# Patient Record
Sex: Female | Born: 1955
Health system: Southern US, Community
[De-identification: ages and names within clinical notes are randomized; demographics above are authoritative.]

## PROBLEM LIST (undated history)

## (undated) ENCOUNTER — Emergency Department (HOSPITAL_BASED_OUTPATIENT_CLINIC_OR_DEPARTMENT_OTHER): Admission: EM | Payer: Medicare Other

## (undated) DIAGNOSIS — K219 Gastro-esophageal reflux disease without esophagitis: Secondary | ICD-10-CM

## (undated) DIAGNOSIS — E785 Hyperlipidemia, unspecified: Secondary | ICD-10-CM

## (undated) DIAGNOSIS — H35039 Hypertensive retinopathy, unspecified eye: Secondary | ICD-10-CM

## (undated) DIAGNOSIS — D34 Benign neoplasm of thyroid gland: Secondary | ICD-10-CM

## (undated) DIAGNOSIS — M1990 Unspecified osteoarthritis, unspecified site: Secondary | ICD-10-CM

## (undated) DIAGNOSIS — D649 Anemia, unspecified: Secondary | ICD-10-CM

## (undated) DIAGNOSIS — I10 Essential (primary) hypertension: Secondary | ICD-10-CM

## (undated) DIAGNOSIS — R311 Benign essential microscopic hematuria: Secondary | ICD-10-CM

## (undated) DIAGNOSIS — M8580 Other specified disorders of bone density and structure, unspecified site: Secondary | ICD-10-CM

## (undated) DIAGNOSIS — I2510 Atherosclerotic heart disease of native coronary artery without angina pectoris: Secondary | ICD-10-CM

## (undated) DIAGNOSIS — T883XXA Malignant hyperthermia due to anesthesia, initial encounter: Secondary | ICD-10-CM

## (undated) DIAGNOSIS — R200 Anesthesia of skin: Secondary | ICD-10-CM

## (undated) DIAGNOSIS — R202 Paresthesia of skin: Secondary | ICD-10-CM

## (undated) DIAGNOSIS — Z8489 Family history of other specified conditions: Secondary | ICD-10-CM

## (undated) HISTORY — DX: Hyperlipidemia, unspecified: E78.5

## (undated) HISTORY — DX: Benign neoplasm of thyroid gland: D34

## (undated) HISTORY — PX: TUBAL LIGATION: SHX77

## (undated) HISTORY — PX: DILATION AND CURETTAGE OF UTERUS: SHX78

## (undated) HISTORY — DX: Gastro-esophageal reflux disease without esophagitis: K21.9

## (undated) HISTORY — DX: Essential (primary) hypertension: I10

## (undated) HISTORY — DX: Benign essential microscopic hematuria: R31.1

## (undated) HISTORY — DX: Hypertensive retinopathy, unspecified eye: H35.039

## (undated) HISTORY — PX: CARDIAC CATHETERIZATION: SHX172

## (undated) HISTORY — DX: Other specified disorders of bone density and structure, unspecified site: M85.80

---

## 1994-11-25 HISTORY — PX: ABDOMINAL HYSTERECTOMY: SHX81

## 1997-11-25 HISTORY — PX: KNEE CARTILAGE SURGERY: SHX688

## 2008-11-25 HISTORY — PX: COLONOSCOPY: SHX174

## 2008-11-25 LAB — HM COLONOSCOPY: HM Colonoscopy: NORMAL

## 2010-11-25 LAB — HM DEXA SCAN: HM Dexa Scan: NORMAL

## 2010-12-17 ENCOUNTER — Emergency Department (HOSPITAL_BASED_OUTPATIENT_CLINIC_OR_DEPARTMENT_OTHER)
Admission: EM | Admit: 2010-12-17 | Discharge: 2010-12-17 | Payer: Self-pay | Source: Home / Self Care | Admitting: Emergency Medicine

## 2011-02-24 LAB — HM PAP SMEAR: HM Pap smear: NORMAL

## 2011-06-14 ENCOUNTER — Encounter: Payer: Self-pay | Admitting: Emergency Medicine

## 2011-06-14 ENCOUNTER — Emergency Department (HOSPITAL_BASED_OUTPATIENT_CLINIC_OR_DEPARTMENT_OTHER)
Admission: EM | Admit: 2011-06-14 | Discharge: 2011-06-14 | Disposition: A | Discharge status: Home / Self Care | Payer: BC Managed Care – PPO | Attending: Emergency Medicine | Admitting: Emergency Medicine

## 2011-06-14 DIAGNOSIS — F17200 Nicotine dependence, unspecified, uncomplicated: Secondary | ICD-10-CM | POA: Insufficient documentation

## 2011-06-14 DIAGNOSIS — L02429 Furuncle of limb, unspecified: Secondary | ICD-10-CM | POA: Insufficient documentation

## 2011-06-14 DIAGNOSIS — L02439 Carbuncle of limb, unspecified: Secondary | ICD-10-CM | POA: Insufficient documentation

## 2011-06-14 DIAGNOSIS — L0292 Furuncle, unspecified: Secondary | ICD-10-CM

## 2011-06-14 MED ORDER — DOXYCYCLINE HYCLATE 100 MG PO TABS: 100.0000 mg | ORAL_TABLET | Freq: Two times a day (BID) | ORAL | Status: DC

## 2011-06-14 MED ORDER — DOXYCYCLINE HYCLATE 100 MG PO TABS
100.0000 mg | ORAL_TABLET | Freq: Once | ORAL | Status: AC
  Administered 2011-06-14: 100 mg via ORAL
  Filled 2011-06-14: qty 1

## 2011-06-14 MED ORDER — HYDROCODONE-ACETAMINOPHEN 5-325 MG PO TABS: 1.0000 | ORAL_TABLET | ORAL | Status: DC | PRN

## 2011-06-14 MED ORDER — HYDROCODONE-ACETAMINOPHEN 5-325 MG PO TABS
1.0000 | ORAL_TABLET | Freq: Once | ORAL | Status: AC
  Administered 2011-06-14: 1 via ORAL

## 2011-06-14 MED ORDER — HYDROCODONE-ACETAMINOPHEN 5-325 MG PO TABS
ORAL_TABLET | ORAL | Status: AC
  Filled 2011-06-14: qty 1

## 2011-06-14 NOTE — ED Notes (Signed)
Pt has small open wound on left elbow with minimal serosanguinous drainage. Mild swelling around elbow noted.

## 2011-06-14 NOTE — ED Provider Notes (Signed)
History     Chief Complaint  Patient presents with  . Abscess   HPI Comments: Patient did notice a small bump on her left arm. It seemed to drain a little bit of fluid. It actually has gotten somewhat better since then. Exacerbated by palpation. There's been no fevers or other complaints. She did however she did feel like she was flushed this evening prior to coming in  Patient is a 55 y.o. female presenting with abscess. The history is provided by the patient.  Abscess  The current episode started today. The problem occurs continuously. The problem has been gradually improving. Affected Location: Left elbow. The problem is moderate. The abscess is characterized by redness and painfulness. Pertinent negatives include no anorexia, no diarrhea, no vomiting, no rhinorrhea, no sore throat and no cough.    History reviewed. No pertinent past medical history.  Past Surgical History  Procedure Date  . Abdominal hysterectomy     History reviewed. No pertinent family history.  History  Substance Use Topics  . Smoking status: Current Everyday Smoker  . Smokeless tobacco: Not on file  . Alcohol Use: No    OB History    Grav Para Term Preterm Abortions TAB SAB Ect Mult Living                  Review of Systems  HENT: Negative for sore throat and rhinorrhea.   Respiratory: Negative for cough.   Gastrointestinal: Negative for vomiting, diarrhea and anorexia.  All other systems reviewed and are negative.    Physical Exam  BP 157/85  Pulse 75  Temp(Src) 97.4 F (36.3 C) (Oral)  Resp 18  SpO2 100%  Physical Exam  Constitutional: She appears well-developed and well-nourished. No distress.  HENT:  Head: Normocephalic and atraumatic.  Right Ear: External ear normal.  Left Ear: External ear normal.  Eyes: Conjunctivae are normal. Right eye exhibits no discharge. Left eye exhibits no discharge. No scleral icterus.  Neck: Neck supple. No tracheal deviation present.    Cardiovascular: Normal rate, regular rhythm and intact distal pulses.   Pulmonary/Chest: Effort normal and breath sounds normal. No stridor. No respiratory distress. She has no wheezes. She has no rales.  Abdominal: Soft. Bowel sounds are normal. She exhibits no distension. There is no tenderness. There is no rebound and no guarding.  Musculoskeletal:       Small papule proximal to the left elbow surrounding erythema with a few centimeters in size. Central area of induration about 1 cm. No fluctuants no sign of discrete abscess  Neurological: She is alert. She has normal strength. No sensory deficit. Cranial nerve deficit:  no gross defecits noted. She exhibits normal muscle tone. She displays no seizure activity. Coordination normal.  Skin: Skin is warm and dry. Rash noted.  Psychiatric: She has a normal mood and affect.    ED Course  Procedures  MDM Consistent with a small pustule possible infected hair follicle versus MRSA. No sign of drainable abscess. Patient otherwise appears well nontoxic.      Celene Kras, MD 06/14/11 [phone]

## 2011-06-24 ENCOUNTER — Ambulatory Visit (INDEPENDENT_AMBULATORY_CARE_PROVIDER_SITE_OTHER): Payer: BC Managed Care – PPO | Admitting: Family

## 2011-06-24 ENCOUNTER — Encounter: Payer: Self-pay | Admitting: Family

## 2011-06-24 VITALS — BP 140/86 | HR 84 | Temp 98.1°F | Resp 16 | Ht 66.5 in | Wt 201.1 lb

## 2011-06-24 DIAGNOSIS — IMO0002 Reserved for concepts with insufficient information to code with codable children: Secondary | ICD-10-CM

## 2011-06-24 DIAGNOSIS — K219 Gastro-esophageal reflux disease without esophagitis: Secondary | ICD-10-CM

## 2011-06-24 DIAGNOSIS — L03119 Cellulitis of unspecified part of limb: Secondary | ICD-10-CM

## 2011-06-24 MED ORDER — DOXYCYCLINE HYCLATE 100 MG PO TABS: 100.0000 mg | ORAL_TABLET | Freq: Two times a day (BID) | ORAL | Status: AC

## 2011-06-24 MED ORDER — OMEPRAZOLE 40 MG PO CPDR: 40.0000 mg | DELAYED_RELEASE_CAPSULE | Freq: Every day | ORAL | Status: DC

## 2011-06-24 NOTE — Assessment & Plan Note (Signed)
This infection appears to be improving clinically. There is no clinical evidence of the bursa or joint itself is involved, it appears superficial in nature. She has good range of motion of her left elbow. I will give her an additional 3 days of doxycycline. I suspect will continue to improve. She is instructed to contact us should the area become more painful, swollen, red or if more drainage. She verbalizes understanding.

## 2011-06-24 NOTE — Assessment & Plan Note (Signed)
We'll give patient a trial of prescription dose omeprazole.

## 2011-06-24 NOTE — Progress Notes (Signed)
Subjective:    Patient ID: Jade [name], female    DOB: Apr 10, 1956, 55 y.o.   MRN: [id number]  HPI  Ms. Jade [name] is a 55 yr old female who presents today to establish care.  She was seen in ED for "spider bite" on the left elbow and  treated with doxy.  She completed the doxycyline yesterday and notes that the area has improved significantly.  Denies current elbow pain, but some "itching." Denies associated fever.  Overall she feels that her symptoms have improved.  GERD- has used prevacid OTC without improvement.    Chronic knee pain-patient uses Aleve frequently. She tells me that she wears bilateral knee braces to help support her knees while she works on her feet.  Review of Systems  Constitutional: Negative for fever and unexpected weight change.  HENT: Negative for hearing loss.   Eyes: Negative for visual disturbance.  Cardiovascular: Negative for chest pain.  Gastrointestinal: Negative for nausea, vomiting and diarrhea.  Genitourinary: Negative for dysuria and frequency.  Musculoskeletal:       Chronic knee pain- uses aleve. Wears a knee brace on both knees.  Skin: Negative for rash.  Hematological: Negative for adenopathy.  Psychiatric/Behavioral:       Denies depression or anxiety.   Past Medical History  Diagnosis Date  . GERD (gastroesophageal reflux disease)     History   Social History  . Marital Status: Married    Spouse Name: N/A    Number of Children: N/A  . Years of Education: N/A   Occupational History  . Not on file.   Social History Main Topics  . Smoking status: Current Everyday Smoker -- 0.5 packs/day for 39 years    Types: Cigarettes  . Smokeless tobacco: Never Used  . Alcohol Use: No  . Drug Use: No  . Sexually Active: Not on file   Other Topics Concern  . 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    Past Surgical History  Procedure Date  . Knee cartilage surgery 1999    right knee  . Abdominal hysterectomy 1996    Family History  Problem Relation Age of Onset  . Hypertension Mother   . Hyperlipidemia Mother   . Heart disease Father   . Diabetes Father   . Hypertension Father   . Hyperlipidemia Father   . Cancer Father     prostate  . Cancer Paternal Aunt     BREAST    Allergies  Allergen Reactions  . Aspirin Nausea And Vomiting    No current outpatient prescriptions on file prior to visit.    BP 140/86  Pulse 84  Temp(Src) 98.1 F (36.7 C) (Oral)  Resp 16  Ht 5' 6.5" (1.689 m)  Wt 201 lb 1.3 oz (91.209 kg)  BMI 31.97 kg/m2  LMP 11/25/1994        Objective:   Physical Exam  Constitutional:       Pleasant overweight African American female, awake, alert, and in no acute distress.  HENT:  Head: Normocephalic and atraumatic.  Eyes: Conjunctivae are normal.  Neck: Normal range of motion. Neck supple. No thyromegaly present.  Cardiovascular: Normal rate and regular rhythm.   Pulmonary/Chest: Effort normal and breath sounds normal.  Musculoskeletal: She exhibits no edema.  Skin:  Shallow ulcer noted on the left medial elbow. (diameter less than 1 cm) No drainage is noted. Firm induration is noted surrounding ulcer approximately half centimeter round ulcer. Minimally tender. No significant erythema is noted. No odor is noted.  Psychiatric: She has a normal mood and affect. Her speech is normal and behavior is normal.          Assessment & Plan:   No problem-specific assessment & plan notes found for this encounter.

## 2011-06-24 NOTE — Patient Instructions (Signed)
Please call if the sore on you left elbow worsens or if it does not continue to improve. Follow up in 1 month for a complete physical- come fasting. Welcome to [facility].

## 2011-07-15 ENCOUNTER — Encounter: Payer: Self-pay | Admitting: Family

## 2011-07-15 ENCOUNTER — Ambulatory Visit (INDEPENDENT_AMBULATORY_CARE_PROVIDER_SITE_OTHER): Payer: BC Managed Care – PPO | Admitting: Family

## 2011-07-15 DIAGNOSIS — R9431 Abnormal electrocardiogram [ECG] [EKG]: Secondary | ICD-10-CM

## 2011-07-15 DIAGNOSIS — Z0000 Encounter for general adult medical examination without abnormal findings: Secondary | ICD-10-CM

## 2011-07-15 DIAGNOSIS — Z23 Encounter for immunization: Secondary | ICD-10-CM

## 2011-07-15 DIAGNOSIS — R300 Dysuria: Secondary | ICD-10-CM

## 2011-07-15 DIAGNOSIS — R3129 Other microscopic hematuria: Secondary | ICD-10-CM

## 2011-07-15 DIAGNOSIS — M2550 Pain in unspecified joint: Secondary | ICD-10-CM

## 2011-07-15 DIAGNOSIS — Z720 Tobacco use: Secondary | ICD-10-CM

## 2011-07-15 DIAGNOSIS — F17200 Nicotine dependence, unspecified, uncomplicated: Secondary | ICD-10-CM

## 2011-07-15 LAB — BASIC METABOLIC PANEL
BUN: 9 mg/dL (ref 6–23)
CO2: 27 mEq/L (ref 19–32)
Calcium: 9.5 mg/dL (ref 8.4–10.5)
Chloride: 107 mEq/L (ref 96–112)
Creat: 0.66 mg/dL (ref 0.50–1.10)
Glucose, Bld: 90 mg/dL (ref 70–99)
Potassium: 3.6 mEq/L (ref 3.5–5.3)
Sodium: 143 mEq/L (ref 135–145)

## 2011-07-15 LAB — POCT URINALYSIS DIPSTICK
Bilirubin, UA: NEGATIVE
Glucose, UA: NEGATIVE
Ketones, UA: NEGATIVE
Leukocytes, UA: NEGATIVE
Nitrite, UA: NEGATIVE
Protein, UA: NEGATIVE
Spec Grav, UA: 1.01
Urobilinogen, UA: 0.2
pH, UA: 6

## 2011-07-15 LAB — HEPATIC FUNCTION PANEL
ALT: 18 U/L (ref 0–35)
AST: 14 U/L (ref 0–37)
Albumin: 4.1 g/dL (ref 3.5–5.2)
Alkaline Phosphatase: 115 U/L (ref 39–117)
Bilirubin, Direct: 0.1 mg/dL (ref 0.0–0.3)
Indirect Bilirubin: 0.3 mg/dL (ref 0.0–0.9)
Total Bilirubin: 0.4 mg/dL (ref 0.3–1.2)
Total Protein: 6.8 g/dL (ref 6.0–8.3)

## 2011-07-15 LAB — CBC WITH DIFFERENTIAL/PLATELET
Basophils Absolute: 0 10*3/uL (ref 0.0–0.1)
Basophils Relative: 0 % (ref 0–1)
Eosinophils Absolute: 0.1 10*3/uL (ref 0.0–0.7)
Eosinophils Relative: 2 % (ref 0–5)
HCT: 40.9 % (ref 36.0–46.0)
Hemoglobin: 13.6 g/dL (ref 12.0–15.0)
Lymphocytes Relative: 45 % (ref 12–46)
Lymphs Abs: 3.3 10*3/uL (ref 0.7–4.0)
MCH: 27.3 pg (ref 26.0–34.0)
MCHC: 33.3 g/dL (ref 30.0–36.0)
MCV: 82.1 fL (ref 78.0–100.0)
Monocytes Absolute: 0.6 10*3/uL (ref 0.1–1.0)
Monocytes Relative: 8 % (ref 3–12)
Neutro Abs: 3.4 10*3/uL (ref 1.7–7.7)
Neutrophils Relative %: 46 % (ref 43–77)
Platelets: 303 10*3/uL (ref 150–400)
RBC: 4.98 MIL/uL (ref 3.87–5.11)
RDW: 13.5 % (ref 11.5–15.5)
WBC: 7.4 10*3/uL (ref 4.0–10.5)

## 2011-07-15 LAB — LIPID PANEL
Cholesterol: 228 mg/dL — ABNORMAL HIGH (ref 0–200)
HDL: 43 mg/dL (ref >39)
LDL Cholesterol: 160 mg/dL — ABNORMAL HIGH (ref 0–99)
Total CHOL/HDL Ratio: 5.3 Ratio
Triglycerides: 126 mg/dL (ref <150)
VLDL: 25 mg/dL (ref 0–40)

## 2011-07-15 LAB — SEDIMENTATION RATE: Sed Rate: 6 mm/hr (ref 0–22)

## 2011-07-15 LAB — RHEUMATOID FACTOR: Rhuematoid fact SerPl-aCnc: <10 IU/mL (ref <=14)

## 2011-07-15 LAB — TSH: TSH: 1.64 u[IU]/mL (ref 0.350–4.500)

## 2011-07-15 NOTE — Patient Instructions (Signed)
Stop taking Aleve- use Tylenol instead. Please complete your lab work on the first floor.  Work hard on quitting smoking.  Restart the nicotine patch when you are ready. Try to walk 20 minutes a day.  Follow up in 6 months, sooner if problems or concerns.

## 2011-07-15 NOTE — Progress Notes (Signed)
Subjective:    Patient ID: Jade [name], female    DOB: June 30, 1956, 55 y.o.   MRN: [id number]  HPI  Preventative- last mammogram was 5 yr ago (at imaging center off of [address] in [place])- normal per patient.  She sees Dr. Alyce Pagan who performed her GYN exam.  Exercise- none.  She reports that she had a colonoscopy 2 yrs ago.  Dr. Ronne Binning, [facility].  Normal per pt.  She tells me that she has had hx of colon polyps in the past.  Pt reports that her last tetanus shot was >9 yrs ago.    Tobacco abuse-  "need to quit."  She is smoking 1/2 PPD.  She tells me that she quit with the patch in the past.     Review of Systems  Constitutional: Negative for fever.  HENT: Negative for hearing loss and congestion.   Eyes: Negative for visual disturbance.  Respiratory: Negative for shortness of breath.   Cardiovascular: Negative for chest pain.  Gastrointestinal: Negative for vomiting, diarrhea and blood in stool.  Genitourinary: Positive for dysuria. Negative for urgency.  Musculoskeletal:       Notes joint stiffness and pain.  "for a while"  Takes aleve every day.    Skin: Negative for rash.  Neurological: Positive for headaches.       Rare HA's.    Hematological: Negative for adenopathy.  Psychiatric/Behavioral:       Denies depression/anxiety concerns.     Past Medical History  Diagnosis Date  . GERD (gastroesophageal reflux disease)     History   Social History  . Marital Status: Married    Spouse Name: N/A    Number of Children: N/A  . Years of Education: N/A   Occupational History  . Not on file.   Social History Main Topics  . Smoking status: Current Everyday Smoker -- 0.5 packs/day for 39 years    Types: Cigarettes  . Smokeless tobacco: Never Used  . Alcohol Use: No  . Drug Use: No  . Sexually Active: Not on file   Other Topics Concern  . 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    Past Surgical History  Procedure Date  . Knee cartilage surgery 1999    right knee  . Abdominal hysterectomy 1996    Family History  Problem Relation Age of Onset  . Hypertension Mother   . Hyperlipidemia Mother   . Heart disease Father   . Diabetes Father   . Hypertension Father   . Hyperlipidemia Father   . Cancer Father     prostate  . Cancer Paternal Aunt     BREAST    Allergies  Allergen Reactions  . Aspirin Nausea And Vomiting  . Azithromycin Rash    Current Outpatient Prescriptions on File Prior to Visit  Medication Sig Dispense Refill  . omeprazole (PRILOSEC) 40 MG capsule Take 1 capsule (40 mg total) by mouth daily.  30 capsule  3    BP 130/90  Pulse 84  Temp(Src) 98 F (36.7 C) (Oral)  Resp 16  Ht 5' 6.5" (1.689 m)  Wt 201 lb (91.173 kg)  BMI 31.96 kg/m2  LMP 11/25/1994       Objective:   Physical Exam  Constitutional: She is oriented to person, place, and time.  She appears well-developed and well-nourished.  HENT:  Head: Normocephalic and atraumatic.  Right Ear: Tympanic membrane and ear canal normal.  Left Ear: Tympanic membrane and ear canal normal.  Mouth/Throat: Uvula is midline, oropharynx is clear and moist and mucous membranes are normal.  Eyes: Conjunctivae are normal. Pupils are equal, round, and reactive to light. Right eye exhibits no discharge. Left eye exhibits no discharge.  Neck: Normal range of motion. Neck supple.  Cardiovascular: Normal rate and regular rhythm.  Exam reveals no gallop and no friction rub.   No murmur heard. Pulmonary/Chest: Effort normal and breath sounds normal. No respiratory distress. She has no wheezes. She has no rales. She exhibits no tenderness.  Abdominal: Soft. Bowel sounds are normal. She exhibits no distension and no mass. There is no tenderness. There is no rebound and no guarding.    Genitourinary:       Deferred to GYN  Musculoskeletal: Normal range of motion.  Neurological: She is alert and oriented to person, place, and time. She exhibits normal muscle tone. Coordination normal.  Skin: Skin is warm and dry. No rash noted. No erythema.  Psychiatric: She has a normal mood and affect. Her behavior is normal. Judgment and thought content normal.          Assessment & Plan:

## 2011-07-16 ENCOUNTER — Telehealth: Payer: Self-pay | Admitting: Family

## 2011-07-16 DIAGNOSIS — Z0000 Encounter for general adult medical examination without abnormal findings: Secondary | ICD-10-CM | POA: Insufficient documentation

## 2011-07-16 DIAGNOSIS — R9431 Abnormal electrocardiogram [ECG] [EKG]: Secondary | ICD-10-CM | POA: Insufficient documentation

## 2011-07-16 DIAGNOSIS — Z720 Tobacco use: Secondary | ICD-10-CM | POA: Insufficient documentation

## 2011-07-16 DIAGNOSIS — M2550 Pain in unspecified joint: Secondary | ICD-10-CM | POA: Insufficient documentation

## 2011-07-16 DIAGNOSIS — R3129 Other microscopic hematuria: Secondary | ICD-10-CM | POA: Insufficient documentation

## 2011-07-16 HISTORY — DX: Abnormal electrocardiogram (ECG) (EKG): R94.31

## 2011-07-16 LAB — ANTI-NUCLEAR AB-TITER (ANA TITER): ANA Titer 1: NEGATIVE

## 2011-07-16 LAB — ANA: Anti Nuclear Antibody(ANA): POSITIVE — AB

## 2011-07-16 NOTE — Telephone Encounter (Signed)
Left message for pt to return my call. When she calls back pls let her know that I reviewed EKG and it is mildly Abnormal. I will refer her for stress test. She should be contacted about her stress test by cardiology within the next 1 week.   Also, microscopic hematuria on UA.   I would like for her to return in 1 month for nurse visit (UA) to recheck her urine for blood.

## 2011-07-16 NOTE — Assessment & Plan Note (Signed)
She reports taking up to 6 aleve a day for joint pain.  I have advised her to discontinue use of aleve/NSAIDS due to risk of GI and renal side effects.  Will check ANA, ESR, RA to exclude autoimmune etiology for her joint pain.

## 2011-07-16 NOTE — Assessment & Plan Note (Addendum)
Immunizations reviewed- tetanus given today in office.  Pt was counseled on diet, exercise and weight loss.  Pap up to date, pt instructed to schedule mammogram on the first floor.  Reports Pap up to date with Dr. Alyce Pagan- GYN.  She reports that she is up to date on her colonoscopy as well.

## 2011-07-16 NOTE — Assessment & Plan Note (Addendum)
Pt was counseled on the importance of tobacco cessation for 3-5 minutes.  I recommended that she try the patch again as she has been successful with the patch in the past.

## 2011-07-16 NOTE — Assessment & Plan Note (Signed)
Routine EKG shows TWI in Lead I.  No old EKG available for comparison.  Risk factors for CAD include obesity and tobacco abuse.  Will refer for Stress test.

## 2011-07-16 NOTE — Assessment & Plan Note (Signed)
Await culture results. Treat if +. Plan to repeat UA in 1 month.  If persistent hematuria will refer to Urology for further evaluation as pt is a smoker and at increased risk for bladder CA.

## 2011-07-17 LAB — URINE CULTURE
Colony Count: NO GROWTH
Organism ID, Bacteria: NO GROWTH

## 2011-07-17 NOTE — Telephone Encounter (Signed)
Notified pt of need to proceed with stress test for further evaluation. Pt advised to call us if she has not been contacted re: stress test appt by Monday.  Pt has scheduled nurse visit for 08/13/11 at 1:30pm and states that she has had blood in her urine in the past that was treated with abx. Has never seen a specialist. Reports that she has occasional burning with urination but not every time.

## 2011-07-19 ENCOUNTER — Encounter: Payer: Self-pay | Admitting: Family

## 2011-07-22 ENCOUNTER — Ambulatory Visit (HOSPITAL_BASED_OUTPATIENT_CLINIC_OR_DEPARTMENT_OTHER)
Admission: RE | Admit: 2011-07-22 | Discharge: 2011-07-22 | Disposition: A | Discharge status: Home / Self Care | Payer: BC Managed Care – PPO | Source: Ambulatory Visit | Attending: Family | Admitting: Family

## 2011-07-22 DIAGNOSIS — Z1231 Encounter for screening mammogram for malignant neoplasm of breast: Secondary | ICD-10-CM | POA: Insufficient documentation

## 2011-08-13 ENCOUNTER — Ambulatory Visit (INDEPENDENT_AMBULATORY_CARE_PROVIDER_SITE_OTHER): Payer: BC Managed Care – PPO | Admitting: Family

## 2011-08-13 DIAGNOSIS — R319 Hematuria, unspecified: Secondary | ICD-10-CM

## 2011-08-13 DIAGNOSIS — K219 Gastro-esophageal reflux disease without esophagitis: Secondary | ICD-10-CM

## 2011-08-13 DIAGNOSIS — R3129 Other microscopic hematuria: Secondary | ICD-10-CM

## 2011-08-13 LAB — POCT URINALYSIS DIPSTICK
Bilirubin, UA: NEGATIVE
Glucose, UA: NEGATIVE
Ketones, UA: NEGATIVE
Leukocytes, UA: NEGATIVE
Nitrite, UA: NEGATIVE
Spec Grav, UA: 1.02
Urobilinogen, UA: 0.2
pH, UA: 5

## 2011-08-13 MED ORDER — OMEPRAZOLE 40 MG PO CPDR: 40.0000 mg | DELAYED_RELEASE_CAPSULE | Freq: Every day | ORAL | Status: DC

## 2011-08-13 NOTE — Assessment & Plan Note (Signed)
Improved, continue PPI 

## 2011-08-13 NOTE — Progress Notes (Signed)
  Subjective:    Patient ID: [name], female    DOB: August 03, 1956, 55 y.o.   MRN: [id number]  HPI  Ms.  Asche is a 55 yr old female who presents today for a follow up urinalysis as part of a scheduled nurse visit.  Back in August she was noted to have microscopic hematuria.  She notes some mild pelvic discomfort today.  GERD- she reports improvement in her GERD symptoms since starting omeprazole and is requesting a refill.    Review of Systems     Objective:   Physical Exam  Constitutional: She appears well-developed and well-nourished.  Psychiatric: She has a normal mood and affect. Her behavior is normal. Judgment and thought content normal.          Assessment & Plan:

## 2011-08-13 NOTE — Assessment & Plan Note (Signed)
Microscopic hematuria persists.  She is a smoker.  Will send urine for culture and refer to Urology for further evaluation.

## 2011-08-15 LAB — URINE CULTURE: Colony Count: 6000

## 2011-10-08 ENCOUNTER — Encounter: Payer: Self-pay | Admitting: Family

## 2011-10-11 ENCOUNTER — Ambulatory Visit (INDEPENDENT_AMBULATORY_CARE_PROVIDER_SITE_OTHER): Payer: BC Managed Care – PPO | Admitting: Family

## 2011-10-11 ENCOUNTER — Encounter: Payer: Self-pay | Admitting: Family

## 2011-10-11 VITALS — BP 140/100 | HR 72 | Temp 97.8°F | Resp 16 | Ht 66.5 in | Wt 208.0 lb

## 2011-10-11 DIAGNOSIS — R3129 Other microscopic hematuria: Secondary | ICD-10-CM

## 2011-10-11 DIAGNOSIS — R9431 Abnormal electrocardiogram [ECG] [EKG]: Secondary | ICD-10-CM

## 2011-10-11 DIAGNOSIS — I10 Essential (primary) hypertension: Secondary | ICD-10-CM

## 2011-10-11 DIAGNOSIS — Z23 Encounter for immunization: Secondary | ICD-10-CM

## 2011-10-11 MED ORDER — AMLODIPINE BESYLATE 5 MG PO TABS: 5.0000 mg | ORAL_TABLET | Freq: Every day | ORAL | Status: DC

## 2011-10-11 NOTE — Assessment & Plan Note (Signed)
BP is elevated today. Will plan to add amlodipine.  Pt advised to follow a low sodium diet.  Follow up in 1 month.

## 2011-10-11 NOTE — Assessment & Plan Note (Signed)
Pt did not complete stress test last visit.  Says that it was never scheduled.  Will reorder.

## 2011-10-11 NOTE — Patient Instructions (Signed)
Please follow up in 1 month.   2 Gram Low Sodium Diet A 2 gram sodium diet restricts the amount of sodium in the diet to no more than 2 g or 2000 mg daily. Limiting the amount of sodium is often used to help lower blood pressure. It is important if you have heart, liver, or kidney problems. Many foods contain sodium for flavor and sometimes as a preservative. When the amount of sodium in a diet needs to be low, it is important to know what to look for when choosing foods and drinks. The following includes some information and guidelines to help make it easier for you to adapt to a low sodium diet. QUICK TIPS  Do not add salt to food.   Avoid convenience items and fast food.   Choose unsalted snack foods.   Buy lower sodium products, often labeled as "lower sodium" or "no salt added."   Check food labels to learn how much sodium is in 1 serving.   When eating at a restaurant, ask that your food be prepared with less salt or none, if possible.  READING FOOD LABELS FOR SODIUM INFORMATION The nutrition facts label is a good place to find how much sodium is in foods. Look for products with no more than 500 to 600 mg of sodium per meal and no more than 150 mg per serving. Remember that 2 g = 2000 mg. The food label may also list foods as:  Sodium-free: Less than 5 mg in a serving.   Very low sodium: 35 mg or less in a serving.   Low-sodium: 140 mg or less in a serving.   Light in sodium: 50% less sodium in a serving. For example, if a food that usually has 300 mg of sodium is changed to become light in sodium, it will have 150 mg of sodium.   Reduced sodium: 25% less sodium in a serving. For example, if a food that usually has 400 mg of sodium is changed to reduced sodium, it will have 300 mg of sodium.  CHOOSING FOODS Grains  Avoid: Salted crackers and snack items. Some cereals, including instant hot cereals. Bread stuffing and biscuit mixes. Seasoned rice or pasta mixes.   Choose:  Unsalted snack items. Low-sodium cereals, oats, puffed wheat and rice, shredded wheat. English muffins and bread. Pasta.  Meats  Avoid: Salted, canned, smoked, spiced, pickled meats, including fish and poultry. Bacon, ham, sausage, cold cuts, hot dogs, anchovies.   Choose: Low-sodium canned tuna and salmon. Fresh or frozen meat, poultry, and fish.  Dairy  Avoid: Processed cheese and spreads. Cottage cheese. Buttermilk and condensed milk. Regular cheese.   Choose: Milk. Low-sodium cottage cheese. Yogurt. Sour cream. Low-sodium cheese.  Fruits and Vegetables  Avoid: Regular canned vegetables. Regular canned tomato sauce and paste. Frozen vegetables in sauces. Olives. Rosita Fire. Relishes. Sauerkraut.   Choose: Low-sodium canned vegetables. Low-sodium tomato sauce and paste. Frozen or fresh vegetables. Fresh and frozen fruit.  Condiments  Avoid: Canned and packaged gravies. Worcestershire sauce. Tartar sauce. Barbecue sauce. Soy sauce. Steak sauce. Ketchup. Onion, garlic, and table salt. Meat flavorings and tenderizers.   Choose: Fresh and dried herbs and spices. Low-sodium varieties of mustard and ketchup. Lemon juice. Tabasco sauce. Horseradish.  SAMPLE 2 GRAM SODIUM MEAL PLAN Breakfast / Sodium (mg)  1 cup low-fat milk / 143 mg   2 slices whole-wheat toast / 270 mg   1 tbs heart-healthy margarine / 153 mg   1 hard-boiled egg / 139  mg   1 small orange / 0 mg  Lunch / Sodium (mg)  1 cup raw carrots / 76 mg    cup hummus / 298 mg   1 cup low-fat milk / 143 mg    cup red grapes / 2 mg   1 whole-wheat pita bread / 356 mg  Dinner / Sodium (mg)  1 cup whole-wheat pasta / 2 mg   1 cup low-sodium tomato sauce / 73 mg   3 oz lean ground beef / 57 mg   1 small side salad (1 cup raw spinach leaves,  cup cucumber,  cup yellow bell pepper) with 1 tsp olive oil and 1 tsp red wine vinegar / 25 mg  Snack / Sodium (mg)  1 container low-fat vanilla yogurt / 107 mg   3 graham  cracker squares / 127 mg  Nutrient Analysis  Calories: 2033   Protein: 77 g   Carbohydrate: 282 g   Fat: 72 g   Sodium: 1971 mg  Document Released: 11/11/2005 Document Revised: 07/24/2011 Document Reviewed: 02/12/2010 [facility] Patient Information 2012 [place], [place].

## 2011-10-11 NOTE — Progress Notes (Signed)
  Subjective:    Patient ID: Jade [name], female    DOB: June 26, 1956, 55 y.o.   MRN: [id number]  HPI  Jade [name] is a 55 yr old female who presents today to discuss elevated blood pressure. She reports that her pressure yesterday was 190/90.  She is seeing orthopedics re: workman's comp for a neck injury. She is seeing [place] [facility] and reports that her BP has been elevated 170/117 last week at Urology.  She has had some headaches but she attributes this to hitting her head on 9/26 at work.  She denies chest pain, sob or LE edema.    Abnormal EKG- she did not complete stress test. Reports that she was never contacted about scheduling.   Microscopic hematuria- had cystoscopy by Dr. Brunilda Payor which was negative.      Review of Systems    see HPI  Past Medical History  Diagnosis Date  . GERD (gastroesophageal reflux disease)   . Benign microscopic hematuria     neg cystoscopy 11/12- dr. Brunilda Payor    History   Social History  . Marital Status: Married    Spouse Name: N/A    Number of Children: N/A  . Years of Education: N/A   Occupational History  . Not on file.   Social History Main Topics  . Smoking status: Current Everyday Smoker -- 0.5 packs/day for 39 years    Types: Cigarettes  . Smokeless tobacco: Never Used  . Alcohol Use: No  . Drug Use: No  . Sexually Active: Not on file   Other Topics Concern  . 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    Past Surgical History  Procedure Date  . Knee cartilage surgery 1999    right knee  . Abdominal hysterectomy 1996    Family History  Problem Relation Age of Onset  . Hypertension Mother   . Hyperlipidemia Mother   . Heart disease Father   . Diabetes Father   . Hypertension Father   . Hyperlipidemia Father   . Cancer Father     prostate  .  Cancer Paternal Aunt     BREAST    Allergies  Allergen Reactions  . Aspirin Nausea And Vomiting  . Azithromycin Rash    Current Outpatient Prescriptions on File Prior to Visit  Medication Sig Dispense Refill  . estrogens, conjugated, (PREMARIN) 0.625 MG tablet Take 0.625 mg by mouth daily. Take daily for 21 days then do not take for 7 days.       Marland Kitchen omeprazole (PRILOSEC) 40 MG capsule Take 1 capsule (40 mg total) by mouth daily.  30 capsule  0    BP 140/100  Pulse 72  Temp(Src) 97.8 F (36.6 C) (Oral)  Resp 16  Ht 5' 6.5" (1.689 m)  Wt 208 lb (94.348 kg)  BMI 33.07 kg/m2  LMP 11/25/1994    Objective:   Physical Exam  Constitutional: She appears well-developed and well-nourished. No distress.  Cardiovascular: Normal rate and regular rhythm.   No murmur heard. Pulmonary/Chest: Effort normal and breath sounds normal. No respiratory distress. She has no wheezes. She has no rales.  Psychiatric: She has a normal mood and affect. Her behavior is normal. Judgment and thought content normal.          Assessment & Plan:

## 2011-10-11 NOTE — Assessment & Plan Note (Signed)
Negative cystoscopy performed by Dr. Brunilda Payor.

## 2011-10-23 ENCOUNTER — Ambulatory Visit (HOSPITAL_COMMUNITY): Payer: BC Managed Care – PPO | Attending: Internal Medicine | Admitting: Radiology

## 2011-10-23 VITALS — Ht 66.5 in | Wt 205.0 lb

## 2011-10-23 DIAGNOSIS — I10 Essential (primary) hypertension: Secondary | ICD-10-CM | POA: Insufficient documentation

## 2011-10-23 DIAGNOSIS — M79609 Pain in unspecified limb: Secondary | ICD-10-CM | POA: Insufficient documentation

## 2011-10-23 DIAGNOSIS — F17200 Nicotine dependence, unspecified, uncomplicated: Secondary | ICD-10-CM | POA: Insufficient documentation

## 2011-10-23 DIAGNOSIS — Z8249 Family history of ischemic heart disease and other diseases of the circulatory system: Secondary | ICD-10-CM | POA: Insufficient documentation

## 2011-10-23 DIAGNOSIS — R9431 Abnormal electrocardiogram [ECG] [EKG]: Secondary | ICD-10-CM

## 2011-10-23 MED ORDER — TECHNETIUM TC 99M TETROFOSMIN IV KIT
33.0000 | PACK | Freq: Once | INTRAVENOUS | Status: AC | PRN
  Administered 2011-10-23: 33 via INTRAVENOUS

## 2011-10-23 NOTE — Progress Notes (Signed)
[facility] SITE 3 NUCLEAR MED [address] [place] [address] [phone]  Cardiology Nuclear Med Study  Jade [name] is a 55 y.o. female [id number] 05/10/1956   Nuclear Med Background Indication for Stress Test:  Evaluation for Ischemia and Abnormal EKG History:  No previous documented CAD Cardiac Risk Factors: Family History - CAD, Hypertension and Smoker  Symptoms:  (R) arm pain   Nuclear Pre-Procedure Caffeine/Decaff Intake:  None NPO After: 8:00pm   Lungs:  clear IV 0.9% NS with Angio Cath:  22g  IV Site: L Hand x 1, tolerated well IV Started by:  Irean Hong, RN  Chest Size (in):  38 Cup Size: C  Height: 5' 6.5" (1.689 m)  Weight:  205 lb (92.987 kg)  BMI:  Body mass index is 32.59 kg/(m^2). Tech Comments:  Patient had caffeine this am 10/23/11, Rest Only Myoview done.    Nuclear Med Study 1 or 2 day study: 2 day  Stress Test Type:  Eugenie Birks  Reading MD: Marca Ancona, MD  Order Authorizing Provider:  Charlynn Court, Montez Hageman, Sandford Craze, NP  Resting Radionuclide: Technetium 81m Tetrofosmin  Resting Radionuclide Dose: 33.0 mCi on 10/23/11   Stress Radionuclide:  Technetium 25m Tetrofosmin  Stress Radionuclide Dose: 33.0 mCi on 10/24/11           Stress Protocol Rest HR: 75 Stress HR: 93  Rest BP: 137/85 Stress BP: 132/72  Exercise Time (min): n/a METS: n/a   Predicted Max HR: 163 bpm % Max HR: 57.06 bpm Rate Pressure Product: 29528   Dose of Adenosine (mg):  n/a Dose of Lexiscan: 0.4 mg  Dose of Atropine (mg): n/a Dose of Dobutamine: n/a mcg/kg/min (at max HR)  Stress Test Technologist: Milana Na, EMT-P  Nuclear Technologist:  Domenic Polite, CNMT     Rest Procedure:  Myocardial perfusion imaging was performed at rest 45 minutes following the intravenous administration of Technetium 7m Tetrofosmin. Rest ECG: NSR  Stress Procedure:  The patient received IV Lexiscan 0.4 mg over 15-seconds.  Technetium 20m Tetrofosmin  injected at 30-seconds.  There were no significant changes and + sob with Lexiscan.  Quantitative spect images were obtained after a 45 minute delay. Stress ECG: No significant change from baseline ECG  QPS Raw Data Images:  Normal; no motion artifact; normal heart/lung ratio. Stress Images:  Normal homogeneous uptake in all areas of the myocardium. Rest Images:  Normal homogeneous uptake in all areas of the myocardium. Subtraction (SDS):  There is no evidence of scar or ischemia. Transient Ischemic Dilatation (Normal <1.22):  1.06 Lung/Heart Ratio (Normal <0.45):  0.30  Quantitative Gated Spect Images QGS EDV:  99 ml QGS ESV:  36 ml QGS cine images:  NL LV Function; NL Wall Motion QGS EF: 63%  Impression Exercise Capacity:  Lexiscan with no exercise. BP Response:  Normal blood pressure response. Clinical Symptoms:  Short of breath, no chest pain.  ECG Impression:  No significant ST segment change suggestive of ischemia. Comparison with Prior Nuclear Study: No images to compare  Overall Impression:  Normal stress nuclear study.  Raeven Pint [employer]

## 2011-10-24 ENCOUNTER — Ambulatory Visit (HOSPITAL_COMMUNITY): Payer: BC Managed Care – PPO | Attending: Cardiology | Admitting: Radiology

## 2011-10-24 DIAGNOSIS — R9431 Abnormal electrocardiogram [ECG] [EKG]: Secondary | ICD-10-CM | POA: Insufficient documentation

## 2011-10-24 DIAGNOSIS — R0989 Other specified symptoms and signs involving the circulatory and respiratory systems: Secondary | ICD-10-CM

## 2011-10-24 MED ORDER — TECHNETIUM TC 99M TETROFOSMIN IV KIT
33.0000 | PACK | Freq: Once | INTRAVENOUS | Status: AC | PRN
  Administered 2011-10-24: 33 via INTRAVENOUS

## 2011-10-24 MED ORDER — REGADENOSON 0.4 MG/5ML IV SOLN
0.4000 mg | Freq: Once | INTRAVENOUS | Status: AC
  Administered 2011-10-24: 0.4 mg via INTRAVENOUS

## 2011-10-25 ENCOUNTER — Telehealth: Payer: Self-pay | Admitting: Family

## 2011-10-25 NOTE — Telephone Encounter (Signed)
Call placed to [phone], she was informed per Sandford Craze instructions.

## 2011-10-25 NOTE — Telephone Encounter (Signed)
Please call pt and let her know that I have reviewed her stress test results and it was normal.

## 2011-10-28 ENCOUNTER — Telehealth: Payer: Self-pay | Admitting: *Deleted

## 2011-10-28 NOTE — Telephone Encounter (Signed)
Received prior auth request from [facility]. Form was signed and faxed to [phone] on 10/11/11 @ 3:45pm.  [facility] [phone]) and spoke to [place] re: status. She states system is requesting that we call [phone] for prior auth. Reached Ann at [facility] and was told that rx was denied as it would not be covered by worker's comp.  Rx will need to be run through her regular insurance.  Attempted to notify pt and left message for pt to return my call.

## 2011-10-30 MED ORDER — OMEPRAZOLE 40 MG PO CPDR: 40.0000 mg | DELAYED_RELEASE_CAPSULE | Freq: Every day | ORAL | Status: DC

## 2011-10-30 NOTE — Telephone Encounter (Signed)
Notified pharmacist at [facility]. Pt states she has picked up Amlodipine rx from 10/11/11. Pt reports that she is going to need to use a mail order service for future refills and will call me back with company name and number.

## 2011-10-30 NOTE — Telephone Encounter (Signed)
Pt called back stating she will be using [facility] mail order service. I advised pt we will not be able to send amlodipine rx until she is seen for follow up to ensure she will continue on that same dose. Will proceed with refill for omeprazole.

## 2011-11-06 ENCOUNTER — Ambulatory Visit (INDEPENDENT_AMBULATORY_CARE_PROVIDER_SITE_OTHER): Payer: BC Managed Care – PPO | Admitting: Family

## 2011-11-06 ENCOUNTER — Ambulatory Visit: Payer: BC Managed Care – PPO | Admitting: Family

## 2011-11-06 ENCOUNTER — Encounter: Payer: Self-pay | Admitting: Family

## 2011-11-06 VITALS — BP 150/96 | HR 78 | Temp 98.0°F | Resp 16 | Ht 66.5 in | Wt 205.1 lb

## 2011-11-06 DIAGNOSIS — I10 Essential (primary) hypertension: Secondary | ICD-10-CM

## 2011-11-06 MED ORDER — LISINOPRIL-HYDROCHLOROTHIAZIDE 10-12.5 MG PO TABS: 1.0000 | ORAL_TABLET | Freq: Every day | ORAL | Status: DC

## 2011-11-06 MED ORDER — AMLODIPINE BESYLATE 5 MG PO TABS: 5.0000 mg | ORAL_TABLET | Freq: Every day | ORAL | Status: DC

## 2011-11-06 NOTE — Assessment & Plan Note (Signed)
BP Readings from Last 3 Encounters:  11/06/11 150/96  10/11/11 140/100  07/15/11 130/90  BP remains elevated.  Will add lisinopril hctz to her regimen and have her follow up in 1 month.

## 2011-11-06 NOTE — Progress Notes (Signed)
  Subjective:    Patient ID: Jade [name], female    DOB: Sep 20, 1956, 55 y.o.   MRN: [id number]  HPI  Patient presents today for followup of hypertension. Patient has been treated for chronic HTN for quiet sometime. She is currently on amlodipine, and not well controlled. No associated S/S related to HTN.   Quality: chronic Modifying factor: meds Duration: Quite sometime Associated S/S: None.  The patient denies the following associated symptoms: Chest pain, dyspnea, blurred vision, headache, or lower extremity edema.     Review of Systems    see HPI  Past Medical History  Diagnosis Date  . GERD (gastroesophageal reflux disease)   . Benign microscopic hematuria     neg cystoscopy 11/12- dr. Brunilda Payor    History   Social History  . Marital Status: Married    Spouse Name: N/A    Number of Children: N/A  . Years of Education: N/A   Occupational History  . Not on file.   Social History Main Topics  . Smoking status: Current Everyday Smoker -- 0.5 packs/day for 39 years    Types: Cigarettes  . Smokeless tobacco: Never Used  . Alcohol Use: No  . Drug Use: No  . Sexually Active: Not on file   Other Topics Concern  . 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    Past Surgical History  Procedure Date  . Knee cartilage surgery 1999    right knee  . Abdominal hysterectomy 1996    Family History  Problem Relation Age of Onset  . Hypertension Mother   . Hyperlipidemia Mother   . Heart disease Father   . Diabetes Father   . Hypertension Father   . Hyperlipidemia Father   . Cancer Father     prostate  . Cancer Paternal Aunt     BREAST    Allergies  Allergen Reactions  . Aspirin Nausea And Vomiting  . Azithromycin Rash    Current Outpatient Prescriptions on File Prior to Visit    Medication Sig Dispense Refill  . estrogens, conjugated, (PREMARIN) 0.625 MG tablet Take 0.625 mg by mouth daily. Take daily for 21 days then do not take for 7 days.       Marland Kitchen omeprazole (PRILOSEC) 40 MG capsule Take 1 capsule (40 mg total) by mouth daily.  90 capsule  1    BP 150/96  Pulse 78  Temp(Src) 98 F (36.7 C) (Oral)  Resp 16  Ht 5' 6.5" (1.689 m)  Wt 205 lb 1.9 oz (93.042 kg)  BMI 32.61 kg/m2  LMP 11/25/1994     Objective:   Physical Exam  Constitutional: She appears well-developed and well-nourished. No distress.  Cardiovascular: Normal rate and regular rhythm.   No murmur heard. Pulmonary/Chest: Effort normal and breath sounds normal. No respiratory distress. She has no wheezes. She has no rales. She exhibits no tenderness.  Musculoskeletal: She exhibits no edema.  Psychiatric: She has a normal mood and affect. Her behavior is normal. Judgment and thought content normal.          Assessment & Plan:

## 2011-11-06 NOTE — Patient Instructions (Signed)
Please follow up in 1 month, sooner if problems or concerns.   Have a nice holiday!

## 2011-11-26 HISTORY — PX: CERVICAL DISC SURGERY: SHX588

## 2011-11-29 ENCOUNTER — Telehealth: Payer: Self-pay | Admitting: *Deleted

## 2011-11-29 NOTE — Telephone Encounter (Signed)
Spoke with CSR at [facility] [facility] [facility] [employer] and was told they did not receive our authorization on 10/30/11 and request was closed.  I was told to call the refill to the doctors line at [phone].

## 2011-11-29 NOTE — Telephone Encounter (Signed)
Received call from pt stating [facility] still has not received our Omeprazole refill from 10/30/11. Advised pt I would call to check on status.

## 2011-11-29 NOTE — Telephone Encounter (Signed)
Refill left on the doctor line #[phone] x 1 refills. Left detailed message on pt's voicemail that refill was completed and to call if she has any questions.

## 2011-12-09 ENCOUNTER — Encounter: Payer: Self-pay | Admitting: Family

## 2011-12-09 ENCOUNTER — Ambulatory Visit (INDEPENDENT_AMBULATORY_CARE_PROVIDER_SITE_OTHER): Payer: BC Managed Care – PPO | Admitting: Family

## 2011-12-09 VITALS — BP 120/80 | HR 78 | Temp 97.8°F | Resp 16 | Ht 66.5 in | Wt 208.0 lb

## 2011-12-09 DIAGNOSIS — I10 Essential (primary) hypertension: Secondary | ICD-10-CM

## 2011-12-09 DIAGNOSIS — K219 Gastro-esophageal reflux disease without esophagitis: Secondary | ICD-10-CM

## 2011-12-09 DIAGNOSIS — F17200 Nicotine dependence, unspecified, uncomplicated: Secondary | ICD-10-CM

## 2011-12-09 DIAGNOSIS — Z720 Tobacco use: Secondary | ICD-10-CM

## 2011-12-09 LAB — BASIC METABOLIC PANEL
BUN: 8 mg/dL (ref 6–23)
CO2: 29 mEq/L (ref 19–32)
Calcium: 9.7 mg/dL (ref 8.4–10.5)
Chloride: 104 mEq/L (ref 96–112)
Creat: 0.72 mg/dL (ref 0.50–1.10)
Glucose, Bld: 98 mg/dL (ref 70–99)
Potassium: 3.9 mEq/L (ref 3.5–5.3)
Sodium: 142 mEq/L (ref 135–145)

## 2011-12-09 NOTE — Progress Notes (Signed)
Subjective:    Patient ID: Jade [name], female    DOB: 02/05/1956, 56 y.o.   MRN: [id number]  HPI  [name] is a 56 yr old female who presents today for follow up of her HTN.    HTN-  Last visit, lisinopril HCT was added to her amlodipine. She is tolerating the medication without difficulty.  She does not that sometimes she has felt that the medication is "too strong" and has started taking it every other day.  She has not checked her BP at home.   GERD- reports that she was out of medication. GERD symptoms have been acting up lately.   Tobacco Abuse-  Motivated to quit.  She does not want to spend "5 dollars a pack!"   Review of Systems See HPI  Past Medical History  Diagnosis Date  . GERD (gastroesophageal reflux disease)   . Benign microscopic hematuria     neg cystoscopy 11/12- dr. Brunilda Payor    History   Social History  . Marital Status: Married    Spouse Name: N/A    Number of Children: N/A  . Years of Education: N/A   Occupational History  . Not on file.   Social History Main Topics  . Smoking status: Current Everyday Smoker -- 0.5 packs/day for 39 years    Types: Cigarettes  . Smokeless tobacco: Never Used  . Alcohol Use: No  . Drug Use: No  . Sexually Active: Not on file   Other Topics Concern  . 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    Past Surgical History  Procedure Date  . Knee cartilage surgery 1999    right knee  . Abdominal hysterectomy 1996    Family History  Problem Relation Age of Onset  . Hypertension Mother   . Hyperlipidemia Mother   . Heart disease Father   . Diabetes Father   . Hypertension Father   . Hyperlipidemia Father   . Cancer Father     prostate  . Cancer Paternal Aunt     BREAST    Allergies  Allergen Reactions  . Aspirin Nausea And  Vomiting  . Azithromycin Rash    Current Outpatient Prescriptions on File Prior to Visit  Medication Sig Dispense Refill  . amLODipine (NORVASC) 5 MG tablet Take 1 tablet (5 mg total) by mouth daily.  90 tablet  1  . cyclobenzaprine (FLEXERIL) 10 MG tablet Take 10 mg by mouth daily.        Marland Kitchen estrogens, conjugated, (PREMARIN) 0.625 MG tablet Take 0.625 mg by mouth daily. Take daily for 21 days then do not take for 7 days.       Marland Kitchen HYDROcodone-acetaminophen (NORCO) 5-325 MG per tablet Take 1 tablet by mouth at bedtime as needed.        Marland Kitchen lisinopril-hydrochlorothiazide (PRINZIDE,ZESTORETIC) 10-12.5 MG per tablet Take 1 tablet by mouth daily.  30 tablet  2  . omeprazole (PRILOSEC) 40 MG capsule Take 1 capsule (40 mg total) by mouth daily.  90 capsule  1    BP 120/80  Pulse 78  Temp(Src) 97.8 F (36.6 C) (Oral)  Resp 16  Ht 5' 6.5" (1.689 m)  Wt 208 lb 0.6 oz (94.366 kg)  BMI 33.08 kg/m2  LMP 11/25/1994       Objective:  Physical Exam  Constitutional: She appears well-developed and well-nourished. No distress.  Cardiovascular: Normal rate and regular rhythm.   No murmur heard. Pulmonary/Chest: Effort normal and breath sounds normal. No respiratory distress. She has no wheezes. She has no rales. She exhibits no tenderness.  Musculoskeletal: She exhibits no edema.  Psychiatric: She has a normal mood and affect. Her behavior is normal. Judgment and thought content normal.          Assessment & Plan:

## 2011-12-09 NOTE — Assessment & Plan Note (Addendum)
BP Readings from Last 3 Encounters:  12/09/11 120/80  11/06/11 150/96  10/11/11 140/100  BP looks great today.  I have advised her to purchase an inexpensive BP monitor and check BP at home.  Let us know if it is running low- see pt instructions. Obtain BMET.

## 2011-12-09 NOTE — Assessment & Plan Note (Signed)
Deteriorated, she is waiting for mail order.  Resume PPI.

## 2011-12-09 NOTE — Assessment & Plan Note (Signed)
Pt was counseled on importance of tobacco abuse for 5 minutes.  We discussed various option including nicotine patch, gum and chantix. She would like to try the patch.

## 2011-12-09 NOTE — Patient Instructions (Signed)
Please complete your blood work prior to leaving. Check Blood pressure once a day for the next few weeks. Call if blood pressure is less than 110/50. Follow up in 3 months. Good luck quitting smoking!

## 2012-01-06 ENCOUNTER — Ambulatory Visit: Payer: BC Managed Care – PPO | Admitting: Family

## 2012-03-04 ENCOUNTER — Telehealth: Payer: Self-pay | Admitting: Family

## 2012-03-04 ENCOUNTER — Encounter: Payer: Self-pay | Admitting: Family

## 2012-03-04 ENCOUNTER — Ambulatory Visit (INDEPENDENT_AMBULATORY_CARE_PROVIDER_SITE_OTHER): Payer: BC Managed Care – PPO | Admitting: Family

## 2012-03-04 VITALS — BP 126/82 | HR 85 | Temp 98.2°F | Resp 16 | Ht 66.5 in | Wt 210.1 lb

## 2012-03-04 DIAGNOSIS — I10 Essential (primary) hypertension: Secondary | ICD-10-CM

## 2012-03-04 DIAGNOSIS — J309 Allergic rhinitis, unspecified: Secondary | ICD-10-CM | POA: Insufficient documentation

## 2012-03-04 DIAGNOSIS — M2550 Pain in unspecified joint: Secondary | ICD-10-CM

## 2012-03-04 MED ORDER — FLUTICASONE PROPIONATE 50 MCG/ACT NA SUSP: 2.0000 | Freq: Every day | NASAL | Status: DC

## 2012-03-04 MED ORDER — LISINOPRIL-HYDROCHLOROTHIAZIDE 10-12.5 MG PO TABS: 1.0000 | ORAL_TABLET | Freq: Every day | ORAL | Status: DC

## 2012-03-04 MED ORDER — METOPROLOL SUCCINATE ER 25 MG PO TB24: 25.0000 mg | ORAL_TABLET | Freq: Every day | ORAL | Status: DC

## 2012-03-04 NOTE — Patient Instructions (Signed)
Please call if your symptoms worsen, or if you are not feeling better in 1 week.  Please follow up in 3 months.

## 2012-03-04 NOTE — Progress Notes (Signed)
Subjective:    Patient ID: Jade [name], female    DOB: [birth year], 56 y.o.   MRN: [id number]  HPI  [name] is a 56 yr old female who presents today with several concerns:  Bilateral knee pain/bilateral knee pain- she has hx of right knee arthroscopy.  Knee pain.  Has seen [facility].    She is having cervical disc surgery- Dr.  Tressie [name] next week.   She is using vicodin prn neck pain.    Cough- reports that it started "like an off/on cold."  Symptoms intermittent.  No coughing last night.  Cough is productive occasionally.  No fevers.  + post-nasal drip.  HTN- Reports that she is tolerating medications without difficulty.    Review of Systems See HPI  Past Medical History  Diagnosis Date  . GERD (gastroesophageal reflux disease)   . Benign microscopic hematuria     neg cystoscopy 11/12- dr. Brunilda [name]    History   Social History  . Marital Status: Married    Spouse Name: N/A    Number of Children: N/A  . Years of Education: N/A   Occupational History  . Not on file.   Social History Main Topics  . Smoking status: Former Smoker -- 0.5 packs/day for 39 years    Types: Cigarettes    Quit date: 02/25/2012  . Smokeless tobacco: Never Used  . Alcohol Use: No  . Drug Use: No  . Sexually Active: Not on file   Other Topics Concern  . 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    Past Surgical History  Procedure Date  . Knee cartilage surgery 1999    right knee  . Abdominal hysterectomy 1996    Family History  Problem Relation Age of Onset  . Hypertension Mother   . Hyperlipidemia Mother   . Heart disease Father   . Diabetes Father   . Hypertension Father   . Hyperlipidemia Father   . Cancer Father     prostate  . Cancer Paternal Aunt     BREAST    Allergies    Allergen Reactions  . Aspirin Nausea And Vomiting  . Azithromycin Rash    Current Outpatient Prescriptions on File Prior to Visit  Medication Sig Dispense Refill  . amLODipine (NORVASC) 5 MG tablet Take 1 tablet (5 mg total) by mouth daily.  90 tablet  1  . cyclobenzaprine (FLEXERIL) 10 MG tablet Take 10 mg by mouth daily.        Marland Kitchen estrogens, conjugated, (PREMARIN) 0.625 MG tablet Take 0.625 mg by mouth daily. Take daily for 21 days then do not take for 7 days.       Marland Kitchen gabapentin (NEURONTIN) 300 MG capsule Take 300 mg by mouth 3 (three) times daily.      Marland Kitchen HYDROcodone-acetaminophen (NORCO) 5-325 MG per tablet Take 1 tablet by mouth at bedtime as needed.        Marland Kitchen lisinopril-hydrochlorothiazide (PRINZIDE,ZESTORETIC) 10-12.5 MG per tablet Take 1 tablet by mouth daily.  30 tablet  2  . omeprazole (PRILOSEC) 40 MG capsule Take 1 capsule (40 mg total) by mouth daily.  90 capsule  1    BP 126/82  Pulse 85  Temp(Src) 98.2 F (36.8 C) (Oral)  Resp 16  Ht 5' 6.5" (1.689 m)  Wt 210 lb 1.3 oz (95.292 kg)  BMI 33.40 kg/m2  SpO2 98%  LMP 11/25/1994        Objective:   Physical Exam  Constitutional: She appears well-developed and well-nourished. No distress.  HENT:  Head: Normocephalic and atraumatic.  Cardiovascular: Normal rate and regular rhythm.   No murmur heard. Pulmonary/Chest: Effort normal and breath sounds normal. No respiratory distress. She has no wheezes. She has no rales. She exhibits no tenderness.  Musculoskeletal:       Mild swelling of bilateral knees.   Psychiatric: She has a normal mood and affect. Her behavior is normal. Judgment and thought content normal.          Assessment & Plan:

## 2012-03-04 NOTE — Assessment & Plan Note (Addendum)
BP is stable and improved.  Cough may be secondary to ace inhibitor however.  Pt would like to discontinue this medication due to cough. Will stop and add toprol xl.

## 2012-03-04 NOTE — Telephone Encounter (Signed)
Spoke with pt re: lisinopril possibly contributing to her cough. She would like to stop this med and start new med.  Will change to toprol xl.  Pt instructed to follow up in 1 month for bp check.

## 2012-03-04 NOTE — Telephone Encounter (Signed)
Left message requesting that pt return my call. 

## 2012-03-04 NOTE — Assessment & Plan Note (Signed)
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 Will consider referral back to ortho for further evaluation after she recovers from her cervical disc surgery. In the meantime I have recommended tylenol for pain.  She is advised to avoid aleve pre-operatively and to take sparingly after surgery.

## 2012-03-04 NOTE — Assessment & Plan Note (Signed)
Trial of Zyrtec and flonase.

## 2012-03-06 ENCOUNTER — Telehealth: Payer: Self-pay | Admitting: *Deleted

## 2012-03-06 NOTE — Telephone Encounter (Signed)
Received fax from [facility] that flonase will require prior auth. [facility] faxed form. Form was signed and faxed back to [facility] [phone].  Received fax from [facility] that they do not handle pt's prior auths and to contact pt's benefit Production designer, [employer]tele[employer]film. Called [employer] and was transferred to [phone] and call was terminated. Will try back later.

## 2012-03-09 ENCOUNTER — Ambulatory Visit: Payer: BC Managed Care – PPO | Admitting: Family

## 2012-03-09 NOTE — Telephone Encounter (Signed)
After numerous calls to  [facility] was not able to contact a department that "handles" pt's prior authorization. Called pharmacy to ask if they are filing this under the correct insurance. Pharmacist re-submitted claim and Rx went through with no problem.

## 2012-05-25 ENCOUNTER — Encounter: Payer: Self-pay | Admitting: Family

## 2012-05-25 ENCOUNTER — Ambulatory Visit (INDEPENDENT_AMBULATORY_CARE_PROVIDER_SITE_OTHER): Payer: BC Managed Care – PPO | Admitting: Family

## 2012-05-25 VITALS — BP 130/90 | HR 71 | Temp 97.7°F | Resp 16 | Ht 66.5 in | Wt 210.0 lb

## 2012-05-25 DIAGNOSIS — Z720 Tobacco use: Secondary | ICD-10-CM

## 2012-05-25 DIAGNOSIS — F17200 Nicotine dependence, unspecified, uncomplicated: Secondary | ICD-10-CM

## 2012-05-25 DIAGNOSIS — M2550 Pain in unspecified joint: Secondary | ICD-10-CM

## 2012-05-25 DIAGNOSIS — I10 Essential (primary) hypertension: Secondary | ICD-10-CM

## 2012-05-25 MED ORDER — OMEPRAZOLE 40 MG PO CPDR: 40.0000 mg | DELAYED_RELEASE_CAPSULE | Freq: Every day | ORAL | Status: DC

## 2012-05-25 MED ORDER — AMLODIPINE BESYLATE 5 MG PO TABS: 5.0000 mg | ORAL_TABLET | Freq: Every day | ORAL | Status: DC

## 2012-05-25 MED ORDER — METOPROLOL SUCCINATE ER 25 MG PO TB24: 25.0000 mg | ORAL_TABLET | Freq: Every day | ORAL | Status: DC

## 2012-05-25 NOTE — Progress Notes (Signed)
Subjective:    Patient ID: Jade [name], female    DOB: 08/21/1956, 56 y.o.   MRN: [id number]  HPI  HTN- last visit her ACE inhibitor was discontinued due to cough and she was placed on a beta blocker. She reports resolution of cough.  Feels "a little light headed at times" on the beta blocker but tells me that this is not bothersome enough to stop the medications.    BP Readings from Last 3 Encounters:  05/25/12 130/90  03/04/12 126/82  12/09/11 120/80    Joint pain-  Still having knee pain. Saw Dr. Pearletha Forge.    Cervical Disc disease- She is undergoing physical therapy.  She tells me that she had cervical disc surgery. She continues to have right sided neck pain/arm pain.  For pain she is using etodolac which helps her pain some. She sees Dr. Lovell Sheehan.  She sees him 8/25.    Tobacco abuse-  She is down to 1.5 cigarettes a day.  She plans to stop buying cigarettes.  "Determined" to quit.   Review of Systems Past Medical History  Diagnosis Date  . GERD (gastroesophageal reflux disease)   . Benign microscopic hematuria     neg cystoscopy 11/12- dr. Brunilda Payor    History   Social History  . Marital Status: Married    Spouse Name: N/A    Number of Children: N/A  . Years of Education: N/A   Occupational History  . Not on file.   Social History Main Topics  . Smoking status: Current Everyday Smoker -- 0.5 packs/day for 39 years    Types: Cigarettes    Last Attempt to Quit: 02/25/2012  . Smokeless tobacco: Never Used   Comment: 1 1/2 cigarettes daily  . Alcohol Use: No  . Drug Use: No  . Sexually Active: Not on file   Other Topics Concern  . 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    Past Surgical History  Procedure Date  . Knee cartilage surgery 1999    right knee  . Abdominal hysterectomy 1996      Family History  Problem Relation Age of Onset  . Hypertension Mother   . Hyperlipidemia Mother   . Heart disease Father   . Diabetes Father   . Hypertension Father   . Hyperlipidemia Father   . Cancer Father     prostate  . Cancer Paternal Aunt     BREAST    Allergies  Allergen Reactions  . Ace Inhibitors     Cough  . Aspirin Nausea And Vomiting  . Azithromycin Rash    Current Outpatient Prescriptions on File Prior to Visit  Medication Sig Dispense Refill  . cetirizine (ZYRTEC) 10 MG tablet Take 10 mg by mouth daily.      Marland Kitchen estrogens, conjugated, (PREMARIN) 0.625 MG tablet Take 0.625 mg by mouth daily. Take daily for 21 days then do not take for 7 days.       Marland Kitchen HYDROcodone-acetaminophen (NORCO) 5-325 MG per tablet Take 1 tablet by mouth at bedtime as needed.        Marland Kitchen DISCONTD: amLODipine (NORVASC) 5 MG tablet Take 1 tablet (5 mg total) by mouth daily.  90 tablet  1  . DISCONTD: metoprolol succinate (TOPROL-XL) 25 MG 24 hr tablet Take 1 tablet (25 mg total)  by mouth daily.  30 tablet  1  . DISCONTD: omeprazole (PRILOSEC) 40 MG capsule Take 1 capsule (40 mg total) by mouth daily.  90 capsule  1  . cyclobenzaprine (FLEXERIL) 10 MG tablet Take 10 mg by mouth daily.        . fluticasone (FLONASE) 50 MCG/ACT nasal spray Place 2 sprays into the nose daily.  16 g  3  . gabapentin (NEURONTIN) 300 MG capsule Take 300 mg by mouth 3 (three) times daily.        BP 130/90  Pulse 71  Temp 97.7 F (36.5 C) (Oral)  Resp 16  Ht 5' 6.5" (1.689 m)  Wt 210 lb (95.255 kg)  BMI 33.39 kg/m2  SpO2 97%  LMP 11/25/1994       Objective:   Physical Exam  Constitutional: She is oriented to person, place, and time. She appears well-developed and well-nourished. No distress.  Cardiovascular: Normal rate and regular rhythm.   No murmur heard. Pulmonary/Chest: Effort normal and breath sounds normal. No respiratory distress. She has no wheezes. She has no rales. She exhibits no tenderness.   Musculoskeletal: She exhibits no edema.  Neurological: She is alert and oriented to person, place, and time. She exhibits normal muscle tone. Coordination normal.  Skin: Skin is warm and dry. No rash noted. No erythema. No pallor.  Psychiatric: She has a normal mood and affect. Her behavior is normal. Judgment and thought content normal.          Assessment & Plan:

## 2012-05-25 NOTE — Patient Instructions (Addendum)
Complete your lab work prior to leaving.  Please schedule a follow up appointment in 3-4 months.

## 2012-05-26 ENCOUNTER — Encounter: Payer: Self-pay | Admitting: Family

## 2012-05-26 LAB — BASIC METABOLIC PANEL
BUN: 9 mg/dL (ref 6–23)
CO2: 24 mEq/L (ref 19–32)
Calcium: 9.7 mg/dL (ref 8.4–10.5)
Chloride: 106 mEq/L (ref 96–112)
Creat: 0.65 mg/dL (ref 0.50–1.10)
Glucose, Bld: 92 mg/dL (ref 70–99)
Potassium: 4 mEq/L (ref 3.5–5.3)
Sodium: 138 mEq/L (ref 135–145)

## 2012-05-26 NOTE — Assessment & Plan Note (Signed)
She is following with [facility].  Defer management to Dr. Pearletha Forge.

## 2012-05-26 NOTE — Assessment & Plan Note (Signed)
Pt counseled on smoking cessation for 3-5 minutes.

## 2012-05-26 NOTE — Assessment & Plan Note (Signed)
Cough is resolved off of ace.  BP stable. Continue Toprol xl.

## 2012-06-08 ENCOUNTER — Ambulatory Visit: Payer: BC Managed Care – PPO | Admitting: Family

## 2012-09-28 ENCOUNTER — Ambulatory Visit: Payer: BC Managed Care – PPO | Admitting: Family

## 2013-01-05 ENCOUNTER — Telehealth: Payer: Self-pay | Admitting: Family

## 2013-01-05 NOTE — Telephone Encounter (Signed)
Received records request from [employer] for DOS 10/10/2010 to current

## 2013-03-15 ENCOUNTER — Encounter (HOSPITAL_BASED_OUTPATIENT_CLINIC_OR_DEPARTMENT_OTHER): Payer: Self-pay | Admitting: Family Medicine

## 2013-03-15 ENCOUNTER — Emergency Department (HOSPITAL_BASED_OUTPATIENT_CLINIC_OR_DEPARTMENT_OTHER): Payer: BC Managed Care – PPO

## 2013-03-15 ENCOUNTER — Emergency Department (HOSPITAL_BASED_OUTPATIENT_CLINIC_OR_DEPARTMENT_OTHER)
Admission: EM | Admit: 2013-03-15 | Discharge: 2013-03-15 | Disposition: A | Discharge status: Home / Self Care | Payer: BC Managed Care – PPO | Attending: Emergency Medicine | Admitting: Emergency Medicine

## 2013-03-15 ENCOUNTER — Telehealth: Payer: Self-pay | Admitting: *Deleted

## 2013-03-15 DIAGNOSIS — F17200 Nicotine dependence, unspecified, uncomplicated: Secondary | ICD-10-CM | POA: Insufficient documentation

## 2013-03-15 DIAGNOSIS — M5412 Radiculopathy, cervical region: Secondary | ICD-10-CM | POA: Insufficient documentation

## 2013-03-15 DIAGNOSIS — Z79899 Other long term (current) drug therapy: Secondary | ICD-10-CM | POA: Insufficient documentation

## 2013-03-15 DIAGNOSIS — Z87448 Personal history of other diseases of urinary system: Secondary | ICD-10-CM | POA: Insufficient documentation

## 2013-03-15 DIAGNOSIS — M5410 Radiculopathy, site unspecified: Secondary | ICD-10-CM

## 2013-03-15 DIAGNOSIS — K219 Gastro-esophageal reflux disease without esophagitis: Secondary | ICD-10-CM | POA: Insufficient documentation

## 2013-03-15 DIAGNOSIS — R209 Unspecified disturbances of skin sensation: Secondary | ICD-10-CM | POA: Insufficient documentation

## 2013-03-15 LAB — BASIC METABOLIC PANEL
BUN: 9 mg/dL (ref 6–23)
CO2: 28 mEq/L (ref 19–32)
Calcium: 9.4 mg/dL (ref 8.4–10.5)
Chloride: 106 mEq/L (ref 96–112)
Creatinine, Ser: 0.6 mg/dL (ref 0.50–1.10)
GFR calc Af Amer: >90 mL/min (ref >90)
GFR calc non Af Amer: >90 mL/min (ref >90)
Glucose, Bld: 100 mg/dL — ABNORMAL HIGH (ref 70–99)
Potassium: 3.6 mEq/L (ref 3.5–5.1)
Sodium: 144 mEq/L (ref 135–145)

## 2013-03-15 LAB — CBC WITH DIFFERENTIAL/PLATELET
Basophils Absolute: 0 10*3/uL (ref 0.0–0.1)
Basophils Relative: 0 % (ref 0–1)
Eosinophils Absolute: 0.1 10*3/uL (ref 0.0–0.7)
Eosinophils Relative: 2 % (ref 0–5)
HCT: 41.2 % (ref 36.0–46.0)
Hemoglobin: 14.1 g/dL (ref 12.0–15.0)
Lymphocytes Relative: 41 % (ref 12–46)
Lymphs Abs: 2.8 10*3/uL (ref 0.7–4.0)
MCH: 27.4 pg (ref 26.0–34.0)
MCHC: 34.2 g/dL (ref 30.0–36.0)
MCV: 80.2 fL (ref 78.0–100.0)
Monocytes Absolute: 0.6 10*3/uL (ref 0.1–1.0)
Monocytes Relative: 9 % (ref 3–12)
Neutro Abs: 3.3 10*3/uL (ref 1.7–7.7)
Neutrophils Relative %: 48 % (ref 43–77)
Platelets: 294 10*3/uL (ref 150–400)
RBC: 5.14 MIL/uL — ABNORMAL HIGH (ref 3.87–5.11)
RDW: 14 % (ref 11.5–15.5)
WBC: 6.8 10*3/uL (ref 4.0–10.5)

## 2013-03-15 MED ORDER — OXYCODONE-ACETAMINOPHEN 5-325 MG PO TABS: 2.0000 | ORAL_TABLET | ORAL | Status: DC | PRN

## 2013-03-15 NOTE — ED Notes (Signed)
MD at bedside. 

## 2013-03-15 NOTE — ED Provider Notes (Signed)
History     CSN: [id number]  Arrival date & time 03/15/13  1030   First MD Initiated Contact with Patient 03/15/13 1108      Chief Complaint  Patient presents with  . Arm Pain    (Consider location/radiation/quality/duration/timing/severity/associated sxs/prior treatment) Patient is a 57 y.o. female presenting with arm injury. The history is provided by the patient. No language interpreter was used.  Arm Injury Location:  Shoulder and arm Injury: no   Shoulder location:  L shoulder Arm location:  L arm Pain details:    Quality:  Tingling   Radiates to:  Does not radiate   Severity:  Moderate   Timing:  Intermittent   Progression:  Worsening Chronicity:  New Handedness:  Left-handed Dislocation: no   Foreign body present:  No foreign bodies Tetanus status:  Out of date Pt complains of numbness in her left arm.  Pt reports she has had neck paroblems.  Pt reports 3 days ago she noticed tingling and numbness to the left side of her face.   Pt reports she is worried about circulation problems to her left leg.  Pt has had problems. With her leg for months  Past Medical History  Diagnosis Date  . GERD (gastroesophageal reflux disease)   . Benign microscopic hematuria     neg cystoscopy 11/12- dr. Brunilda Payor    Past Surgical History  Procedure Laterality Date  . Knee cartilage surgery  1999    right knee  . Abdominal hysterectomy  1996  . Cervical disc surgery      Dr. Lovell Sheehan    Family History  Problem Relation Age of Onset  . Hypertension Mother   . Hyperlipidemia Mother   . Heart disease Father   . Diabetes Father   . Hypertension Father   . Hyperlipidemia Father   . Cancer Father     prostate  . Cancer Paternal Aunt     BREAST    History  Substance Use Topics  . Smoking status: Current Every Day Smoker -- 0.50 packs/day for 39 years    Types: Cigarettes    Last Attempt to Quit: 02/25/2012  . Smokeless tobacco: Never Used     Comment: 1 1/2 cigarettes daily   . Alcohol Use: No    OB History   Grav Para Term Preterm Abortions TAB SAB Ect Mult Living                  Review of Systems  Unable to perform ROS All other systems reviewed and are negative.    Allergies  Ace inhibitors; Aspirin; and Azithromycin  Home Medications   Current Outpatient Rx  Name  Route  Sig  Dispense  Refill  . amLODipine (NORVASC) 5 MG tablet   Oral   Take 1 tablet (5 mg total) by mouth daily.   90 tablet   1   . cetirizine (ZYRTEC) 10 MG tablet   Oral   Take 10 mg by mouth daily.         . cyclobenzaprine (FLEXERIL) 10 MG tablet   Oral   Take 10 mg by mouth daily.           . diazepam (VALIUM) 5 MG tablet   Oral   Take 5 mg by mouth Every 8 hours.           [facility] neurology   . estrogens, conjugated, (PREMARIN) 0.625 MG tablet   Oral   Take 0.625 mg by mouth daily. Take daily  for 21 days then do not take for 7 days.          Marland Kitchen etodolac (LODINE) 400 MG tablet   Oral   Take 400 mg by mouth Twice daily.           [facility] neurology   . EXPIRED: fluticasone (FLONASE) 50 MCG/ACT nasal spray   Nasal   Place 2 sprays into the nose daily.   16 g   3   . gabapentin (NEURONTIN) 300 MG capsule   Oral   Take 300 mg by mouth 3 (three) times daily.         Marland Kitchen HYDROcodone-acetaminophen (NORCO) 5-325 MG per tablet   Oral   Take 1 tablet by mouth at bedtime as needed.           . metoprolol succinate (TOPROL-XL) 25 MG 24 hr tablet   Oral   Take 1 tablet (25 mg total) by mouth daily.   90 tablet   1   . omeprazole (PRILOSEC) 40 MG capsule   Oral   Take 1 capsule (40 mg total) by mouth daily.   90 capsule   1   . oxyCODONE-acetaminophen (PERCOCET) 10-325 MG per tablet   Oral   Take 10-325 mg by mouth Every 4 hours as needed.           [facility]     BP 140/89  Pulse 76  Temp(Src) 98 F (36.7 C) (Oral)  Resp 20  SpO2 96%  LMP 11/25/1994  Physical Exam  Nursing note and vitals  reviewed. Constitutional: She is oriented to person, place, and time. She appears well-developed and well-nourished.  HENT:  Head: Normocephalic.  Right Ear: External ear normal.  Left Ear: External ear normal.  Eyes: Pupils are equal, round, and reactive to light.  Neck: Normal range of motion.  Cardiovascular: Normal rate.   Pulmonary/Chest: Effort normal and breath sounds normal.  Abdominal: Soft.  Musculoskeletal: Normal range of motion.  Neurological: She is alert and oriented to person, place, and time. She has normal reflexes.  Skin: Skin is warm.  Psychiatric: She has a normal mood and affect.    ED Course  Procedures (including critical care time)  Labs Reviewed  CBC WITH DIFFERENTIAL - Abnormal; Notable for the following:    RBC 5.14 (*)    All other components within normal limits  BASIC METABOLIC PANEL - Abnormal; Notable for the following:    Glucose, Bld 100 (*)    All other components within normal limits   Ct Head Wo Contrast  03/15/2013  *RADIOLOGY REPORT*  Clinical Data: Trauma  CT HEAD WITHOUT CONTRAST  Technique:  Contiguous axial images were obtained from the base of the skull through the vertex without contrast.  Comparison: None.  Findings: The ventricles are normal in size and configuration. There is no mass, hemorrhage, extra-axial fluid collection, or midline shift.  Gray-white compartments are normal.  The bony calvarium appears intact without apparent fracture.  The mastoid air cells are clear.  IMPRESSION: Study within normal limits.   Original Report Authenticated By: Bretta Bang, M.D.      No diagnosis found.    MDM   Results for orders placed during the hospital encounter of 03/15/13  CBC WITH DIFFERENTIAL      Result Value Range   WBC 6.8  4.0 - 10.5 K/uL   RBC 5.14 (*) 3.87 - 5.11 MIL/uL   Hemoglobin 14.1  12.0 - 15.0 g/dL   HCT 41.2  36.0 - 46.0 %   MCV 80.2  78.0 - 100.0 fL   MCH 27.4  26.0 - 34.0 pg   MCHC 34.2  30.0 - 36.0  g/dL   RDW 40.9  81.1 - 91.4 %   Platelets 294  150 - 400 K/uL   Neutrophils Relative 48  43 - 77 %   Neutro Abs 3.3  1.7 - 7.7 K/uL   Lymphocytes Relative 41  12 - 46 %   Lymphs Abs 2.8  0.7 - 4.0 K/uL   Monocytes Relative 9  3 - 12 %   Monocytes Absolute 0.6  0.1 - 1.0 K/uL   Eosinophils Relative 2  0 - 5 %   Eosinophils Absolute 0.1  0.0 - 0.7 K/uL   Basophils Relative 0  0 - 1 %   Basophils Absolute 0.0  0.0 - 0.1 K/uL  BASIC METABOLIC PANEL      Result Value Range   Sodium 144  135 - 145 mEq/L   Potassium 3.6  3.5 - 5.1 mEq/L   Chloride 106  96 - 112 mEq/L   CO2 28  19 - 32 mEq/L   Glucose, Bld 100 (*) 70 - 99 mg/dL   BUN 9  6 - 23 mg/dL   Creatinine, Ser 7.82  0.50 - 1.10 mg/dL   Calcium 9.4  8.4 - 95.6 mg/dL   GFR calc non Af Amer >90  >90 mL/min   GFR calc Af Amer >90  >90 mL/min   Ct Head Wo Contrast  03/15/2013  *RADIOLOGY REPORT*  Clinical Data: Trauma  CT HEAD WITHOUT CONTRAST  Technique:  Contiguous axial images were obtained from the base of the skull through the vertex without contrast.  Comparison: None.  Findings: The ventricles are normal in size and configuration. There is no mass, hemorrhage, extra-axial fluid collection, or midline shift.  Gray-white compartments are normal.  The bony calvarium appears intact without apparent fracture.  The mastoid air cells are clear.  IMPRESSION: Study within normal limits.   Original Report Authenticated By: Bretta Bang, M.D.     Date: 03/15/2013  Rate: 74  Rhythm: normal sinus rhythm  QRS Axis: normal  Intervals: QT prolonged  ST/T Wave abnormalities: normal  Conduction Disutrbances:none and left anterior fascicular block  Narrative Interpretation:   Old EKG Reviewed: none available    Ct scan no acute abnormality.   Pt advised to take asa a day.   Percocet fopr pain.   Follow up with Macario Carls for recheck,     Elson Areas, PA-C 03/15/13 1432

## 2013-03-15 NOTE — Telephone Encounter (Signed)
I agree with referral to ED.

## 2013-03-15 NOTE — Telephone Encounter (Signed)
Pt called stating she has had intermittent numbness / tingling in her left arm and fingers since Friday. Also notes tingling in face and some pain in left side of chest. Pt reports history of neck surgery and similar symptoms in right arm. Advised pt she should be evaluated in ER and she voices understanding.

## 2013-03-15 NOTE — ED Notes (Signed)
Pt c/o left arm pain radiating from left posterior shoulder since last Thursday, pt reports h/o same in right arm with disc problems. Pt also sts left leg hurts intermittently upon standing for "months".

## 2013-03-16 NOTE — ED Provider Notes (Signed)
Medical screening examination/treatment/procedure(s) were conducted as a shared visit with non-physician practitioner(s) and myself.  I personally evaluated the patient during the encounter.  The patient presents with pain in the left hip and knee, as well as pain in the left arm, shoulder, and neck.  She denies any injury or trauma.  She has had this pain for some time and it is now getting worse.  She feels as though her left face is tingling.  No visual complaints.  No weakness.    On exam, the patient is afebrile and the vitals are stable.  The heart is regular rate and rhythm and the lungs are clear.  The abdomen is benign.  There is pain with range of motion of the left hip and knee although both are stable to exam.  There is also pain with range of motion of the shoulder, but distal pulses, motor, and sensory are intact.  Neurologic exam reveals cranial nerves to be intact.  There is no facial droop and speech is clear.  Strength is 5/5 in all extremities.  Finger to nose and coordination is normal.   The pains she is complaining of appears musculoskeletal in nature and most likely unrelated.  CT was obtained of the head to rule out cva and was negative.  I highly doubt cva and do not feel as though further workup into this in indicated.  She is ambulatory without difficulty and appears quite stable for discharge.    Geoffery Lyons, MD 03/16/13 [id number]

## 2013-03-24 ENCOUNTER — Telehealth: Payer: Self-pay | Admitting: *Deleted

## 2013-03-24 ENCOUNTER — Encounter: Payer: Self-pay | Admitting: Family

## 2013-03-24 ENCOUNTER — Ambulatory Visit (INDEPENDENT_AMBULATORY_CARE_PROVIDER_SITE_OTHER): Payer: BC Managed Care – PPO | Admitting: Family

## 2013-03-24 VITALS — BP 160/100 | HR 79 | Temp 98.6°F | Resp 16 | Ht 66.0 in | Wt 211.0 lb

## 2013-03-24 DIAGNOSIS — M25562 Pain in left knee: Secondary | ICD-10-CM

## 2013-03-24 DIAGNOSIS — M25561 Pain in right knee: Secondary | ICD-10-CM

## 2013-03-24 DIAGNOSIS — M5090 Cervical disc disorder, unspecified, unspecified cervical region: Secondary | ICD-10-CM

## 2013-03-24 DIAGNOSIS — R739 Hyperglycemia, unspecified: Secondary | ICD-10-CM

## 2013-03-24 DIAGNOSIS — M25569 Pain in unspecified knee: Secondary | ICD-10-CM

## 2013-03-24 DIAGNOSIS — R7303 Prediabetes: Secondary | ICD-10-CM

## 2013-03-24 DIAGNOSIS — R7309 Other abnormal glucose: Secondary | ICD-10-CM

## 2013-03-24 DIAGNOSIS — R3129 Other microscopic hematuria: Secondary | ICD-10-CM

## 2013-03-24 DIAGNOSIS — I10 Essential (primary) hypertension: Secondary | ICD-10-CM

## 2013-03-24 LAB — URINALYSIS, ROUTINE W REFLEX MICROSCOPIC
Glucose, UA: NEGATIVE mg/dL
Hgb urine dipstick: NEGATIVE
Ketones, ur: NEGATIVE mg/dL
Leukocytes, UA: NEGATIVE
Nitrite: NEGATIVE
Protein, ur: NEGATIVE mg/dL
Specific Gravity, Urine: 1.024 (ref 1.005–1.030)
Urobilinogen, UA: 1 mg/dL (ref 0.0–1.0)
pH: 5.5 (ref 5.0–8.0)

## 2013-03-24 LAB — HEMOGLOBIN A1C
Hgb A1c MFr Bld: 6 % — ABNORMAL HIGH (ref <5.7)
Mean Plasma Glucose: 126 mg/dL — ABNORMAL HIGH (ref <117)

## 2013-03-24 MED ORDER — OMEPRAZOLE 40 MG PO CPDR: 40.0000 mg | DELAYED_RELEASE_CAPSULE | Freq: Every day | ORAL | Status: DC

## 2013-03-24 MED ORDER — GABAPENTIN 300 MG PO CAPS: 300.0000 mg | ORAL_CAPSULE | Freq: Three times a day (TID) | ORAL | Status: DC

## 2013-03-24 MED ORDER — METOPROLOL SUCCINATE ER 25 MG PO TB24: 25.0000 mg | ORAL_TABLET | Freq: Every day | ORAL | Status: DC

## 2013-03-24 MED ORDER — AMLODIPINE BESYLATE 5 MG PO TABS: 5.0000 mg | ORAL_TABLET | Freq: Every day | ORAL | Status: DC

## 2013-03-24 NOTE — Progress Notes (Signed)
Subjective:    Patient ID: Jade [name], female    DOB: Oct 30, 1956, 57 y.o.   MRN: [id number]  HPI  Jade [name] is a 57 yr old female who presents today following an ED visit on 4/21.  ED records are reviewed.  She reports that she had pain radiating down the left shoulder with left hand numbess and "funny feeling in her face." She underwent a CT of the head which was unremarkable. Since returning home she reports less tingling in her left arm.  She has know hx of CTS.  She had cervical surgery last year with Dr. Lovell Sheehan including an MRI of the C spine. Wants to be referred to a different neurosurgeon. (Dr. Corinne Ports).   She reports that she continues to have bilateral knee pain. She has followed with [facility] for this under workman's comp, but this case is closed.  She would like referral back to them under her private insurance.       Review of Systems See HPI  Past Medical History  Diagnosis Date  . GERD (gastroesophageal reflux disease)   . Benign microscopic hematuria     neg cystoscopy 11/12- dr. Brunilda Payor    History   Social History  . Marital Status: Married    Spouse Name: N/A    Number of Children: N/A  . Years of Education: N/A   Occupational History  . Not on file.   Social History Main Topics  . Smoking status: Current Every Day Smoker -- 0.50 packs/day for 39 years    Types: Cigarettes    Last Attempt to Quit: 02/25/2012  . Smokeless tobacco: Never Used     Comment: 1 1/2 cigarettes daily  . Alcohol Use: No  . Drug Use: No  . Sexually Active: Not on file   Other Topics Concern  . Not on file   Social History Narrative   Regular exercise:  No   Caffeine Use:  2 cups coffee and 3-4 glasses of soda daily.   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    Past Surgical History  Procedure Laterality Date  . Knee cartilage surgery  1999    right knee  . Abdominal hysterectomy  1996  .  Cervical disc surgery      Dr. Lovell Sheehan    Family History  Problem Relation Age of Onset  . Hypertension Mother   . Hyperlipidemia Mother   . Heart disease Father   . Diabetes Father   . Hypertension Father   . Hyperlipidemia Father   . Cancer Father     prostate  . Cancer Paternal Aunt     BREAST    Allergies  Allergen Reactions  . Ace Inhibitors     Cough  . Aspirin Nausea And Vomiting  . Azithromycin Rash    Current Outpatient Prescriptions on File Prior to Visit  Medication Sig Dispense Refill  . cetirizine (ZYRTEC) 10 MG tablet Take 10 mg by mouth daily.      . diazepam (VALIUM) 5 MG tablet Take 5 mg by mouth Every 8 hours.      Marland Kitchen estrogens, conjugated, (PREMARIN) 0.625 MG tablet Take 0.625 mg by mouth daily. Take daily for 21 days then do not take for 7 days.       . fluticasone (FLONASE) 50 MCG/ACT nasal spray Place 2 sprays into the nose daily.  16 g  3  . oxyCODONE-acetaminophen (  PERCOCET/ROXICET) 5-325 MG per tablet Take 2 tablets by mouth every 4 (four) hours as needed for pain.  15 tablet  0   No current facility-administered medications on file prior to visit.    BP 160/100  Pulse 79  Temp(Src) 98.6 F (37 C) (Oral)  Resp 16  Ht 5\' 6"  (1.676 m)  Wt 211 lb (95.709 kg)  BMI 34.07 kg/m2  SpO2 97%  LMP 11/25/1994       Objective:   Physical Exam  Constitutional: She is oriented to person, place, and time. She appears well-developed and well-nourished. No distress.  HENT:  Head: Normocephalic and atraumatic.  Mouth/Throat: No oropharyngeal exudate.  Cardiovascular: Normal rate and regular rhythm.   No murmur heard. Pulmonary/Chest: Effort normal and breath sounds normal. No respiratory distress. She has no wheezes. She has no rales. She exhibits no tenderness.  Musculoskeletal: She exhibits no edema.  Neurological: She is alert and oriented to person, place, and time.  Psychiatric: She has a normal mood and affect. Her behavior is normal. Judgment  and thought content normal.          Assessment & Plan:

## 2013-03-24 NOTE — Telephone Encounter (Signed)
Received call from pt stating she was calling back with name of neurologist she has seen in the past.  Corinne Ports at [address] [facility] ph) [phone].

## 2013-03-24 NOTE — Patient Instructions (Addendum)
Please complete lab work prior to leaving. Follow up in 1 month for a complete physical.

## 2013-03-26 ENCOUNTER — Telehealth: Payer: Self-pay | Admitting: Family

## 2013-03-26 ENCOUNTER — Encounter: Payer: Self-pay | Admitting: Family

## 2013-03-26 DIAGNOSIS — R7303 Prediabetes: Secondary | ICD-10-CM

## 2013-03-26 DIAGNOSIS — M25561 Pain in right knee: Secondary | ICD-10-CM | POA: Insufficient documentation

## 2013-03-26 DIAGNOSIS — E119 Type 2 diabetes mellitus without complications: Secondary | ICD-10-CM | POA: Insufficient documentation

## 2013-03-26 DIAGNOSIS — M5090 Cervical disc disorder, unspecified, unspecified cervical region: Secondary | ICD-10-CM | POA: Insufficient documentation

## 2013-03-26 MED ORDER — AMLODIPINE BESYLATE 10 MG PO TABS: 10.0000 mg | ORAL_TABLET | Freq: Every day | ORAL | Status: DC

## 2013-03-26 NOTE — Telephone Encounter (Signed)
Spoke with pt. She would like referral back to [facility] for bilateral knee pain. She saw them previously under workman's comp.  She had cervical surgery last year with Dr. Lovell Sheehan including an MRI of the C spine.  Wants to be referred to a different neurosurgeon.  (Dr. Corinne Ports).    Nicki Guadalajara could you pls request records from Dr. Lovell Sheehan office- surgical records and mri?

## 2013-03-26 NOTE — Assessment & Plan Note (Signed)
Unchanged. Refer back to [facility] for ongoing care.

## 2013-03-26 NOTE — Assessment & Plan Note (Signed)
A1C obtained notes borderline DM- recommended diabetic diet, exercise, weight loss. See phone note.

## 2013-03-26 NOTE — Telephone Encounter (Addendum)
Pls call pt and let her know that her lab work is showing borderline diabetes.  She should work weight loss, exercise, and on avoiding concentrated sweets and white carbs. Also, I would like her to increase amlodipine from 5mg  to 10mg  once daily to help with bp control. Rx sent to pharmacy.

## 2013-03-26 NOTE — Assessment & Plan Note (Signed)
She continues to have radicular symptoms.  Will request records from Neurosurgery. After these are obtained, can refer to Dr. Flo Shanks.

## 2013-03-26 NOTE — Assessment & Plan Note (Signed)
BP Readings from Last 3 Encounters:  03/24/13 160/100  03/15/13 163/84  05/25/12 130/90   BP is up.  Will increase amlodipine from 5mg  to 10mg .

## 2013-03-26 NOTE — Telephone Encounter (Signed)
Called Dr Lovell Sheehan office [phone] and requested records from medical records [facility]). Awaiting fax.

## 2013-03-29 NOTE — Telephone Encounter (Signed)
Notified pt. 

## 2013-03-29 NOTE — Telephone Encounter (Signed)
Received records from Dr Lovell Sheehan, states no MRI on file. Record forwarded to Provider for review.

## 2013-03-29 NOTE — Telephone Encounter (Signed)
Unable to leave message on home # as "mailbox is full". Left message to return my call on cell#.

## 2013-03-30 NOTE — Telephone Encounter (Signed)
See order.

## 2013-04-02 ENCOUNTER — Telehealth: Payer: Self-pay | Admitting: *Deleted

## 2013-04-02 MED ORDER — MELOXICAM 7.5 MG PO TABS: 7.5000 mg | ORAL_TABLET | Freq: Every day | ORAL | Status: DC

## 2013-04-02 NOTE — Telephone Encounter (Signed)
Pt called stating she has started walking at our recommendation and her knee pain has worsened. States tylenol is not helping and the pain keeps her awake at night. Pt requesting med to help with the pain until she can see the orthopedic doctor on the 19th?

## 2013-04-02 NOTE — Telephone Encounter (Signed)
Left detailed message on home# and to call if any questions. 

## 2013-04-02 NOTE — Telephone Encounter (Signed)
Rx sent for meloxicam.  I see that she has vomiting with aspirin.  If she is able to tolerate motrin, she should be able to tolerate meloxicam.

## 2013-04-20 ENCOUNTER — Encounter: Payer: Self-pay | Admitting: Family

## 2013-04-20 ENCOUNTER — Ambulatory Visit (INDEPENDENT_AMBULATORY_CARE_PROVIDER_SITE_OTHER): Payer: BC Managed Care – PPO | Admitting: Family

## 2013-04-20 ENCOUNTER — Other Ambulatory Visit: Payer: Self-pay | Admitting: Family

## 2013-04-20 VITALS — BP 146/78 | HR 73 | Temp 97.8°F | Resp 16 | Ht 66.0 in | Wt 209.1 lb

## 2013-04-20 DIAGNOSIS — Z23 Encounter for immunization: Secondary | ICD-10-CM

## 2013-04-20 DIAGNOSIS — Z1231 Encounter for screening mammogram for malignant neoplasm of breast: Secondary | ICD-10-CM

## 2013-04-20 DIAGNOSIS — F17200 Nicotine dependence, unspecified, uncomplicated: Secondary | ICD-10-CM

## 2013-04-20 DIAGNOSIS — I10 Essential (primary) hypertension: Secondary | ICD-10-CM

## 2013-04-20 DIAGNOSIS — Z0000 Encounter for general adult medical examination without abnormal findings: Secondary | ICD-10-CM

## 2013-04-20 DIAGNOSIS — Z720 Tobacco use: Secondary | ICD-10-CM

## 2013-04-20 LAB — LIPID PANEL
Cholesterol: 271 mg/dL — ABNORMAL HIGH (ref 0–200)
HDL: 54 mg/dL (ref >39)
LDL Cholesterol: 189 mg/dL — ABNORMAL HIGH (ref 0–99)
Total CHOL/HDL Ratio: 5 Ratio
Triglycerides: 141 mg/dL (ref <150)
VLDL: 28 mg/dL (ref 0–40)

## 2013-04-20 LAB — HEPATIC FUNCTION PANEL
ALT: 17 U/L (ref 0–35)
AST: 11 U/L (ref 0–37)
Albumin: 4 g/dL (ref 3.5–5.2)
Alkaline Phosphatase: 92 U/L (ref 39–117)
Bilirubin, Direct: 0.1 mg/dL (ref 0.0–0.3)
Indirect Bilirubin: 0.3 mg/dL (ref 0.0–0.9)
Total Bilirubin: 0.4 mg/dL (ref 0.3–1.2)
Total Protein: 6.8 g/dL (ref 6.0–8.3)

## 2013-04-20 LAB — TSH: TSH: 1.119 u[IU]/mL (ref 0.350–4.500)

## 2013-04-20 MED ORDER — METOPROLOL SUCCINATE ER 50 MG PO TB24: 50.0000 mg | ORAL_TABLET | Freq: Every day | ORAL | Status: DC

## 2013-04-20 NOTE — Patient Instructions (Addendum)
Please increase metoprolol from 25mg  to 50mg  once daily- 50mg  tabs have been sent to your pharmacy. Follow up in 1 month for blood pressure check. Call if allergy symptoms worsen or if you develop fever.  You can see if zyrtec 10mg  once daily helps your symptoms.  Schedule mammogram on the first floor. Complete lab work prior to leaving.  Work hard on quitting smoking and getting regular exercise.  Try to keep your calories <1200-you can use the Myfitnesspal app to help you keep track.

## 2013-04-20 NOTE — Assessment & Plan Note (Signed)
Improving still above goal. Will increase toprol xl from 25mg  to 50mg  once daily.

## 2013-04-20 NOTE — Assessment & Plan Note (Signed)
Pt counseled on importance of smoking cessation.  

## 2013-04-20 NOTE — Addendum Note (Signed)
Addended by: Mervin Kung A on: 04/20/2013 11:19 AM   Modules accepted: Orders

## 2013-04-20 NOTE — Progress Notes (Signed)
Subjective:    Patient ID: Jade [name], female    DOB: 07/30/1956, 57 y.o.   MRN: [id number]  HPI  Patient presents today for complete physical. Eye exam- 2013 Immunizations: tetanus up to date,  Due for pneumovax.  Diet: trying to eat healthy Exercise: having trouble exercising due to knee pain Colonoscopy: up to date- reports that she was told to follow up in 10 years as last colo reportedly normal.  Reports that this was done at [facility] in [place].   Dexa: normal 2012 per pt. Done at [facility] Pap Smear:2012 Mammogram:2012 Tobacco- reports that she continues to smoke 1/2 PPD.  Plans to start electronic cigarette.       Review of Systems  Constitutional: Negative for unexpected weight change.  HENT: Positive for congestion and sneezing.   Eyes: Negative for visual disturbance.  Respiratory: Negative for cough and shortness of breath.        Mild cough  Cardiovascular: Negative for chest pain.  Gastrointestinal: Positive for constipation.  Genitourinary: Negative for dysuria and hematuria.  Musculoskeletal: Positive for arthralgias.  Skin: Positive for rash.  Neurological:       Reports occasional headaches.  Hematological: Negative for adenopathy.  Psychiatric/Behavioral:       Feels bored due to not working  Continues to have knee pain/right shoulder pain.  Now seeing ortho.  Has apt with neurosurgeon.    Past Medical History  Diagnosis Date  . GERD (gastroesophageal reflux disease)   . Benign microscopic hematuria     neg cystoscopy 11/12- dr. Brunilda Payor    History   Social History  . Marital Status: Married    Spouse Name: N/A    Number of Children: N/A  . Years of Education: N/A   Occupational History  . Not on file.   Social History Main Topics  . Smoking status: Current Every Day Smoker -- 0.50 packs/day for 39 years    Types: Cigarettes    Last Attempt to Quit: 02/25/2012  . Smokeless tobacco: Never Used     Comment: 1 1/2 cigarettes daily   . Alcohol Use: No  . Drug Use: No  . Sexually Active: Not on file   Other Topics Concern  . Not on file   Social History Narrative   Regular exercise:  No   Caffeine Use:  2 cups coffee and 3-4 glasses of soda daily.   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    Past Surgical History  Procedure Laterality Date  . Knee cartilage surgery  1999    right knee  . Abdominal hysterectomy  1996  . Cervical disc surgery      Dr. Lovell Sheehan    Family History  Problem Relation Age of Onset  . Hypertension Mother   . Hyperlipidemia Mother   . Heart disease Father   . Diabetes Father   . Hypertension Father   . Hyperlipidemia Father   . Cancer Father     prostate  . Cancer Paternal Aunt     BREAST    Allergies  Allergen Reactions  . Ace Inhibitors     Cough  . Aspirin Nausea And Vomiting  . Azithromycin Rash    Current Outpatient Prescriptions on File Prior to Visit  Medication Sig Dispense Refill  . amLODipine (NORVASC) 10 MG tablet Take 1 tablet (10 mg total) by mouth daily.  30 tablet  0  . fluticasone (FLONASE) 50  MCG/ACT nasal spray Place 2 sprays into the nose daily.  16 g  3  . gabapentin (NEURONTIN) 300 MG capsule Take 1 capsule (300 mg total) by mouth 3 (three) times daily.  90 capsule  3  . meloxicam (MOBIC) 7.5 MG tablet Take 1 tablet (7.5 mg total) by mouth daily.  14 tablet  0  . metoprolol succinate (TOPROL-XL) 25 MG 24 hr tablet Take 1 tablet (25 mg total) by mouth daily.  30 tablet  3  . omeprazole (PRILOSEC) 40 MG capsule Take 1 capsule (40 mg total) by mouth daily.  30 capsule  3   No current facility-administered medications on file prior to visit.    BP 146/78  Pulse 73  Temp(Src) 97.8 F (36.6 C) (Oral)  Resp 16  Ht 5\' 6"  (1.676 m)  Wt 209 lb 1.3 oz (94.838 kg)  BMI 33.76 kg/m2  SpO2 97%  LMP 11/25/1994       Objective:   Physical Exam   Physical Exam  Constitutional: Obese AA  female. She is oriented to person, place, and time. She appears well-developed and well-nourished. No distress.  HENT:  Head: Normocephalic and atraumatic.  Right Ear: Tympanic membrane and ear canal normal.  Left Ear: Tympanic membrane and ear canal normal.  Mouth/Throat: Oropharynx is clear and moist.  Eyes: Pupils are equal, round, and reactive to light. No scleral icterus.  Neck: Normal range of motion. No thyromegaly present.  Cardiovascular: Normal rate and regular rhythm.   No murmur heard. Pulmonary/Chest: Effort normal and breath sounds normal. No respiratory distress. He has no wheezes. She has no rales. She exhibits no tenderness.  Abdominal: Soft. Bowel sounds are normal. He exhibits no distension and no mass. There is no tenderness. There is no rebound and no guarding.  Musculoskeletal: She exhibits no edema.  Lymphadenopathy:    She has no cervical adenopathy.  Neurological: She is alert and oriented to person, place, and time.  She exhibits normal muscle tone. Coordination normal.  Skin: Skin is warm and dry.  Psychiatric: She has a normal mood and affect. Her behavior is normal. Judgment and thought content normal.  Breasts: Examined lying Right: Without masses, retractions, discharge or axillary adenopathy.  Left: Without masses, retractions, discharge or axillary adenopathy.  Inguinal/mons: Normal without inguinal adenopathy  External genitalia: Normal  BUS/Urethra/Skene's glands: Normal  Bladder: Normal  Vagina: Normal  Uterus: surgically absent Adnexa/parametria:  Rt: Without masses or tenderness.  Lt: Without masses or tenderness.  Anus and perineum: Normal           Assessment & Plan:          Assessment & Plan:

## 2013-04-20 NOTE — Assessment & Plan Note (Signed)
Pt counseled on healthy diet, exercise, weight loss.  Refer for mammo.

## 2013-04-21 ENCOUNTER — Inpatient Hospital Stay (HOSPITAL_BASED_OUTPATIENT_CLINIC_OR_DEPARTMENT_OTHER): Admission: RE | Admit: 2013-04-21 | Payer: BC Managed Care – PPO | Source: Ambulatory Visit

## 2013-04-21 ENCOUNTER — Ambulatory Visit (HOSPITAL_BASED_OUTPATIENT_CLINIC_OR_DEPARTMENT_OTHER)
Admission: RE | Admit: 2013-04-21 | Discharge: 2013-04-21 | Disposition: A | Discharge status: Home / Self Care | Payer: BC Managed Care – PPO | Source: Ambulatory Visit | Attending: Family | Admitting: Family

## 2013-04-21 DIAGNOSIS — Z1231 Encounter for screening mammogram for malignant neoplasm of breast: Secondary | ICD-10-CM

## 2013-04-22 ENCOUNTER — Telehealth: Payer: Self-pay | Admitting: Family

## 2013-04-22 DIAGNOSIS — E785 Hyperlipidemia, unspecified: Secondary | ICD-10-CM

## 2013-04-22 MED ORDER — ATORVASTATIN CALCIUM 20 MG PO TABS: 20.0000 mg | ORAL_TABLET | Freq: Every day | ORAL | Status: DC

## 2013-04-22 NOTE — Telephone Encounter (Signed)
Cholesterol is very high.  I recommend that she start atorvastatin and work on low fat/low cholesterol diet. Repeat FLP/LFT 6 weeks (hyperlipidemia).Thyroid and liver tests are normal.

## 2013-04-23 NOTE — Telephone Encounter (Signed)
Patient notified of results and advised about diet. Patient advised to repeat labs in 6 weeks. Lab order placed.

## 2013-05-17 ENCOUNTER — Ambulatory Visit: Payer: BC Managed Care – PPO | Admitting: Family

## 2013-05-17 DIAGNOSIS — Z0289 Encounter for other administrative examinations: Secondary | ICD-10-CM

## 2013-05-20 ENCOUNTER — Other Ambulatory Visit: Payer: Self-pay | Admitting: Family

## 2013-05-25 HISTORY — PX: ROTATOR CUFF REPAIR: SHX139

## 2013-07-22 ENCOUNTER — Other Ambulatory Visit: Payer: Self-pay | Admitting: Family

## 2013-07-22 NOTE — Telephone Encounter (Signed)
Informed patient of medication refill and she scheduled appointment for 07/28/13 °

## 2013-07-22 NOTE — Telephone Encounter (Signed)
30 day supply of metoprolol and omeprazole sent to pharmacy.  Pt is past due for follow up BP check. Please call pt to arrange appt.

## 2013-07-28 ENCOUNTER — Ambulatory Visit (INDEPENDENT_AMBULATORY_CARE_PROVIDER_SITE_OTHER): Payer: BC Managed Care – PPO | Admitting: Family

## 2013-07-28 ENCOUNTER — Encounter: Payer: Self-pay | Admitting: Family

## 2013-07-28 ENCOUNTER — Ambulatory Visit: Payer: BC Managed Care – PPO | Admitting: Family

## 2013-07-28 VITALS — BP 130/78 | HR 79 | Temp 97.8°F | Resp 16 | Ht 66.0 in | Wt 212.0 lb

## 2013-07-28 DIAGNOSIS — M25561 Pain in right knee: Secondary | ICD-10-CM

## 2013-07-28 DIAGNOSIS — F17200 Nicotine dependence, unspecified, uncomplicated: Secondary | ICD-10-CM

## 2013-07-28 DIAGNOSIS — G4710 Hypersomnia, unspecified: Secondary | ICD-10-CM

## 2013-07-28 DIAGNOSIS — M25569 Pain in unspecified knee: Secondary | ICD-10-CM

## 2013-07-28 DIAGNOSIS — I10 Essential (primary) hypertension: Secondary | ICD-10-CM

## 2013-07-28 DIAGNOSIS — Z720 Tobacco use: Secondary | ICD-10-CM

## 2013-07-28 DIAGNOSIS — R400 Somnolence: Secondary | ICD-10-CM | POA: Insufficient documentation

## 2013-07-28 MED ORDER — MELOXICAM 7.5 MG PO TABS: 7.5000 mg | ORAL_TABLET | Freq: Every day | ORAL | Status: DC

## 2013-07-28 MED ORDER — VARENICLINE TARTRATE 0.5 MG X 11 & 1 MG X 42 PO MISC: ORAL | Status: DC

## 2013-07-28 MED ORDER — METOPROLOL SUCCINATE ER 50 MG PO TB24: ORAL_TABLET | ORAL | Status: DC

## 2013-07-28 NOTE — Assessment & Plan Note (Signed)
Refill on meloxicam, follow up with ortho as scheduled.

## 2013-07-28 NOTE — Assessment & Plan Note (Signed)
BP is improved on increased dose of toprol. Continue same.

## 2013-07-28 NOTE — Assessment & Plan Note (Signed)
Have advised pt to discontinue afternoon nap.  Hopefully will be able to sleep better at night.  If no improvement with this modification, consider sleep study.

## 2013-07-28 NOTE — Assessment & Plan Note (Addendum)
Smoking cessation instruction/counseling given x 3-5 minutes:  counseled patient on the dangers of tobacco use, advised patient to stop smoking, and reviewed strategies to maximize success. She would like to try chantix.  Trial of chantix. Common side effects including rare risk of suicide ideation was discussed with the patient today.  Patient is instructed to go directly to the ED if this occurs.  We discussed that patient can continue to smoke for 1 week after starting chantix, but then must discontinue cigarettes.  He is also instructed to contact us prior to completion of the starter month pack for an rx for the continuation month pack.

## 2013-07-28 NOTE — Progress Notes (Signed)
Subjective:    Patient ID: Jade [name], female    DOB: 01-06-1956, 57 y.o.   MRN: [id number]  HPI  Jade [name] is a 57 yr female who presents today for follow up of her HTN.  Last visit her toprol xl was increased from 25 mg to 50 mg.  Denies CP/SOB/Swelling or headache. She reports that the toprol makes her a bit drowsy. She takes it at night which seems to help.  She reports that she naps daily for 1-2 hours in the afternoon.  She reports that her husband tells he she snores.  She reports that if she has a good night sleep she wakes up feeling rested.    She reports left knee pain and requests refill on meloxicam. She is scheduled to see Dr. Jillyn Hidden on 9/16.   Tobacco abuse- reports that she smokes 1 PPD.   Review of Systems See HPI  Past Medical History  Diagnosis Date  . GERD (gastroesophageal reflux disease)   . Benign microscopic hematuria     neg cystoscopy 11/12- dr. Brunilda Payor    History   Social History  . Marital Status: Married    Spouse Name: N/A    Number of Children: N/A  . Years of Education: N/A   Occupational History  . Not on file.   Social History Main Topics  . Smoking status: Current Every Day Smoker -- 0.50 packs/day for 39 years    Types: Cigarettes    Last Attempt to Quit: 02/25/2012  . Smokeless tobacco: Never Used     Comment: 1 1/2 cigarettes daily  . Alcohol Use: No  . Drug Use: No  . Sexual Activity: Not on file   Other Topics Concern  . Not on file   Social History Narrative   Regular exercise:  No   Caffeine Use:  2 cups coffee and 3-4 glasses of soda daily.   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    Past Surgical History  Procedure Laterality Date  . Knee cartilage surgery  1999    right knee  . Abdominal hysterectomy  1996  . Cervical disc surgery      Dr. Lovell Sheehan  . Rotator cuff repair Right 05/25/13    Family History  Problem Relation Age of Onset  .  Hypertension Mother   . Hyperlipidemia Mother   . Heart disease Father   . Diabetes Father   . Hypertension Father   . Hyperlipidemia Father   . Cancer Father     prostate  . Cancer Paternal Aunt     BREAST    Allergies  Allergen Reactions  . Ace Inhibitors     Cough  . Aspirin Nausea And Vomiting  . Azithromycin Rash    Current Outpatient Prescriptions on File Prior to Visit  Medication Sig Dispense Refill  . amLODipine (NORVASC) 10 MG tablet TAKE 1 TABLET (10 MG TOTAL) BY MOUTH DAILY.  30 tablet  3  . atorvastatin (LIPITOR) 20 MG tablet Take 1 tablet (20 mg total) by mouth daily.  30 tablet  3  . gabapentin (NEURONTIN) 300 MG capsule Take 1 capsule (300 mg total) by mouth 3 (three) times daily.  90 capsule  3  . metoprolol succinate (TOPROL-XL) 50 MG 24 hr tablet TAKE 1 TABLET (50 MG TOTAL) BY MOUTH DAILY. TAKE WITH OR IMMEDIATELY FOLLOWING A MEAL.  30 tablet  0  . omeprazole (PRILOSEC) 40  MG capsule TAKE 1 CAPSULE (40 MG TOTAL) BY MOUTH DAILY.  30 capsule  0  . meloxicam (MOBIC) 7.5 MG tablet Take 1 tablet (7.5 mg total) by mouth daily.  14 tablet  0   No current facility-administered medications on file prior to visit.    BP 130/78  Pulse 79  Temp(Src) 97.8 F (36.6 C) (Oral)  Resp 16  Ht 5\' 6"  (1.676 m)  Wt 212 lb (96.163 kg)  BMI 34.23 kg/m2  SpO2 99%  LMP 11/25/1994       Objective:   Physical Exam  Constitutional: She is oriented to person, place, and time. She appears well-developed and well-nourished. No distress.  HENT:  Head: Normocephalic and atraumatic.  Cardiovascular: Normal rate and regular rhythm.   No murmur heard. Pulmonary/Chest: Effort normal and breath sounds normal. No respiratory distress. She has no wheezes. She has no rales. She exhibits no tenderness.  Neurological: She is alert and oriented to person, place, and time.  Psychiatric: She has a normal mood and affect. Her behavior is normal. Judgment and thought content normal.           Assessment & Plan:

## 2013-07-28 NOTE — Patient Instructions (Addendum)
Please work hard on quitting smoking. Start chantix.  Quit smoking after 1 week on chantix.   Follow up in 1 month so we can see how you are doing on chantix. Return fasting one day this week for blood work.

## 2013-08-17 ENCOUNTER — Encounter: Payer: Self-pay | Admitting: Family

## 2013-08-17 ENCOUNTER — Ambulatory Visit (INDEPENDENT_AMBULATORY_CARE_PROVIDER_SITE_OTHER): Payer: BC Managed Care – PPO | Admitting: Family

## 2013-08-17 VITALS — BP 152/82 | HR 76 | Temp 97.6°F | Resp 18 | Ht 66.0 in | Wt 214.0 lb

## 2013-08-17 DIAGNOSIS — Z23 Encounter for immunization: Secondary | ICD-10-CM

## 2013-08-17 DIAGNOSIS — R9431 Abnormal electrocardiogram [ECG] [EKG]: Secondary | ICD-10-CM

## 2013-08-17 DIAGNOSIS — E785 Hyperlipidemia, unspecified: Secondary | ICD-10-CM

## 2013-08-17 DIAGNOSIS — Z01818 Encounter for other preprocedural examination: Secondary | ICD-10-CM

## 2013-08-17 MED ORDER — METOPROLOL SUCCINATE ER 50 MG PO TB24: ORAL_TABLET | ORAL | Status: DC

## 2013-08-17 MED ORDER — AMLODIPINE BESYLATE 10 MG PO TABS: ORAL_TABLET | ORAL | Status: DC

## 2013-08-17 MED ORDER — ATORVASTATIN CALCIUM 20 MG PO TABS: 20.0000 mg | ORAL_TABLET | Freq: Every day | ORAL | Status: DC

## 2013-08-17 MED ORDER — OMEPRAZOLE 40 MG PO CPDR: DELAYED_RELEASE_CAPSULE | ORAL | Status: DC

## 2013-08-17 NOTE — Assessment & Plan Note (Signed)
Patient had negative stress test in 2012.  EKG is performed today and compared to EKG 03/15/13.  Note is made of new TWI V4-V6.  I advised pt that I will would like for her to be evaluated by cardiologist for clearance prior to surgery. She verbalizes understanding.

## 2013-08-17 NOTE — Patient Instructions (Addendum)
Please return fasting for lab work this week. Complete x ray on the first floor.

## 2013-08-17 NOTE — Progress Notes (Signed)
Subjective:    Patient ID: Jade [name], female    DOB: May 17, 1956, 57 y.o.   MRN: [id number]  HPI  Jade [name] is a 57 yr old female who presents today for preoperative clearance for L TKA.    Review of Systems  Constitutional: Negative for unexpected weight change.  HENT: Negative for congestion.   Eyes: Negative for visual disturbance.  Respiratory: Negative for cough and shortness of breath.   Cardiovascular: Negative for chest pain.  Gastrointestinal: Negative for nausea, vomiting and diarrhea.  Genitourinary: Negative for dysuria and frequency.  Musculoskeletal: Positive for arthralgias.  Skin: Negative for rash.  Neurological: Negative for headaches.  Hematological: Negative for adenopathy.  Psychiatric/Behavioral:       Denies depression   Past Medical History  Diagnosis Date  . GERD (gastroesophageal reflux disease)   . Benign microscopic hematuria     neg cystoscopy 11/12- dr. Brunilda Payor    History   Social History  . Marital Status: Married    Spouse Name: N/A    Number of Children: N/A  . Years of Education: N/A   Occupational History  . Not on file.   Social History Main Topics  . Smoking status: Current Every Day Smoker -- 0.50 packs/day for 39 years    Types: Cigarettes    Last Attempt to Quit: 02/25/2012  . Smokeless tobacco: Never Used     Comment: 1 1/2 cigarettes daily  . Alcohol Use: No  . Drug Use: No  . Sexual Activity: Not on file   Other Topics Concern  . Not on file   Social History Narrative   Regular exercise:  No   Caffeine Use:  2 cups coffee and 3-4 glasses of soda daily.   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    Past Surgical History  Procedure Laterality Date  . Knee cartilage surgery  1999    right knee  . Abdominal hysterectomy  1996  . Cervical disc surgery      Dr. Lovell Sheehan  . Rotator cuff repair Right 05/25/13    Family History  Problem Relation Age  of Onset  . Hypertension Mother   . Hyperlipidemia Mother   . Heart disease Father   . Diabetes Father   . Hypertension Father   . Hyperlipidemia Father   . Cancer Father     prostate  . Cancer Paternal Aunt     BREAST    Allergies  Allergen Reactions  . Ace Inhibitors     Cough  . Aspirin Nausea And Vomiting  . Azithromycin Rash    Current Outpatient Prescriptions on File Prior to Visit  Medication Sig Dispense Refill  . gabapentin (NEURONTIN) 300 MG capsule Take 1 capsule (300 mg total) by mouth 3 (three) times daily.  90 capsule  3  . meloxicam (MOBIC) 7.5 MG tablet Take 1 tablet (7.5 mg total) by mouth daily.  14 tablet  0  . varenicline (CHANTIX STARTING MONTH PAK) 0.5 MG X 11 & 1 MG X 42 tablet Take one 0.5 mg tablet by mouth once daily for 3 days, then increase to one 0.5 mg tablet twice daily for 4 days, then increase to one 1 mg tablet twice daily.  53 tablet  0   No current facility-administered medications on file prior to visit.    BP 152/82  Pulse 76  Temp(Src) 97.6 F (36.4 C) (Oral)  Resp 18  Ht 5\' 6"  (1.676 m)  Wt 214 lb (97.07 kg)  BMI 34.56 kg/m2  SpO2 99%  LMP 11/25/1994        Objective:   Physical Exam  Constitutional: She is oriented to person, place, and time. She appears well-developed and well-nourished. No distress.  HENT:  Head: Normocephalic and atraumatic.  Cardiovascular: Normal rate and regular rhythm.   No murmur heard. Pulmonary/Chest: Effort normal and breath sounds normal. No respiratory distress. She has no wheezes. She has no rales. She exhibits no tenderness.  Musculoskeletal: She exhibits no edema.  Neurological: She is alert and oriented to person, place, and time.  Psychiatric: She has a normal mood and affect. Her behavior is normal. Judgment and thought content normal.          Assessment & Plan:

## 2013-08-17 NOTE — Assessment & Plan Note (Signed)
Obtain pre-operative labs, CXR, EKG.

## 2013-08-20 ENCOUNTER — Ambulatory Visit (HOSPITAL_BASED_OUTPATIENT_CLINIC_OR_DEPARTMENT_OTHER)
Admission: RE | Admit: 2013-08-20 | Discharge: 2013-08-20 | Disposition: A | Discharge status: Home / Self Care | Payer: BC Managed Care – PPO | Source: Ambulatory Visit | Attending: Family | Admitting: Family

## 2013-08-20 DIAGNOSIS — Z01818 Encounter for other preprocedural examination: Secondary | ICD-10-CM | POA: Insufficient documentation

## 2013-08-20 LAB — HEPATIC FUNCTION PANEL
ALT: 13 U/L (ref 0–35)
AST: 12 U/L (ref 0–37)
Albumin: 4.2 g/dL (ref 3.5–5.2)
Alkaline Phosphatase: 95 U/L (ref 39–117)
Bilirubin, Direct: 0.1 mg/dL (ref 0.0–0.3)
Indirect Bilirubin: 0.3 mg/dL (ref 0.0–0.9)
Total Bilirubin: 0.4 mg/dL (ref 0.3–1.2)
Total Protein: 6.8 g/dL (ref 6.0–8.3)

## 2013-08-20 LAB — CBC WITH DIFFERENTIAL/PLATELET
Basophils Absolute: 0 10*3/uL (ref 0.0–0.1)
Basophils Relative: 0 % (ref 0–1)
Eosinophils Absolute: 0.1 10*3/uL (ref 0.0–0.7)
Eosinophils Relative: 1 % (ref 0–5)
HCT: 40.3 % (ref 36.0–46.0)
Hemoglobin: 13.8 g/dL (ref 12.0–15.0)
Lymphocytes Relative: 41 % (ref 12–46)
Lymphs Abs: 2.7 10*3/uL (ref 0.7–4.0)
MCH: 26.9 pg (ref 26.0–34.0)
MCHC: 34.2 g/dL (ref 30.0–36.0)
MCV: 78.6 fL (ref 78.0–100.0)
Monocytes Absolute: 0.5 10*3/uL (ref 0.1–1.0)
Monocytes Relative: 7 % (ref 3–12)
Neutro Abs: 3.4 10*3/uL (ref 1.7–7.7)
Neutrophils Relative %: 51 % (ref 43–77)
Platelets: 364 10*3/uL (ref 150–400)
RBC: 5.13 MIL/uL — ABNORMAL HIGH (ref 3.87–5.11)
RDW: 13.7 % (ref 11.5–15.5)
WBC: 6.7 10*3/uL (ref 4.0–10.5)

## 2013-08-20 LAB — BASIC METABOLIC PANEL WITH GFR
BUN: 7 mg/dL (ref 6–23)
CO2: 28 mEq/L (ref 19–32)
Calcium: 9.1 mg/dL (ref 8.4–10.5)
Chloride: 106 mEq/L (ref 96–112)
Creat: 0.55 mg/dL (ref 0.50–1.10)
GFR, Est African American: >89 mL/min
GFR, Est Non African American: >89 mL/min
Glucose, Bld: 111 mg/dL — ABNORMAL HIGH (ref 70–99)
Potassium: 3.6 mEq/L (ref 3.5–5.3)
Sodium: 140 mEq/L (ref 135–145)

## 2013-08-20 LAB — LIPID PANEL
Cholesterol: 286 mg/dL — ABNORMAL HIGH (ref 0–200)
HDL: 52 mg/dL (ref >39)
LDL Cholesterol: 200 mg/dL — ABNORMAL HIGH (ref 0–99)
Total CHOL/HDL Ratio: 5.5 Ratio
Triglycerides: 168 mg/dL — ABNORMAL HIGH (ref <150)
VLDL: 34 mg/dL (ref 0–40)

## 2013-08-21 LAB — URINE CULTURE
Colony Count: NO GROWTH
Organism ID, Bacteria: NO GROWTH

## 2013-08-21 LAB — PROTIME-INR
INR: 0.95 (ref <1.50)
Prothrombin Time: 12.7 seconds (ref 11.6–15.2)

## 2013-08-23 ENCOUNTER — Telehealth: Payer: Self-pay | Admitting: Family

## 2013-08-23 DIAGNOSIS — E785 Hyperlipidemia, unspecified: Secondary | ICD-10-CM

## 2013-08-23 MED ORDER — ATORVASTATIN CALCIUM 40 MG PO TABS: 40.0000 mg | ORAL_TABLET | Freq: Every day | ORAL | Status: DC

## 2013-08-23 NOTE — Telephone Encounter (Signed)
Spoke with pt re: hyperlipidemia. She admits to not taking lipitor regularly. Advised pt to restart at 40mg  once daily. Advised pt to return to the lab in 6 weeks for flp/lft.  Could you please place orders for future labs? thanks

## 2013-08-23 NOTE — Telephone Encounter (Signed)
Reviewed lab work. Cholesterol very elevated.

## 2013-08-25 ENCOUNTER — Ambulatory Visit: Payer: BC Managed Care – PPO | Admitting: Family

## 2013-08-25 NOTE — Telephone Encounter (Signed)
Lab orders placed.  

## 2013-09-27 ENCOUNTER — Ambulatory Visit (INDEPENDENT_AMBULATORY_CARE_PROVIDER_SITE_OTHER): Payer: BC Managed Care – PPO | Admitting: Family

## 2013-09-27 ENCOUNTER — Encounter: Payer: Self-pay | Admitting: Family

## 2013-09-27 VITALS — BP 156/92 | HR 76 | Temp 98.0°F | Resp 16 | Ht 66.0 in | Wt 216.0 lb

## 2013-09-27 DIAGNOSIS — L299 Pruritus, unspecified: Secondary | ICD-10-CM

## 2013-09-27 MED ORDER — AMLODIPINE BESYLATE 10 MG PO TABS: ORAL_TABLET | ORAL | Status: DC

## 2013-09-27 MED ORDER — OMEPRAZOLE 40 MG PO CPDR: DELAYED_RELEASE_CAPSULE | ORAL | Status: DC

## 2013-09-27 MED ORDER — METOPROLOL SUCCINATE ER 50 MG PO TB24: ORAL_TABLET | ORAL | Status: DC

## 2013-09-27 NOTE — Progress Notes (Signed)
Subjective:    Patient ID: Jade [name], female    DOB: 03-Sep-1956, 57 y.o.   MRN: [id number]  HPI  Ms. Jade [name] is a 57 yr old female who presents today for follow up.  Itching- pt reports that when she takes her blood pressure medication at night she begins to itch.  Reports that on Friday night she had to take BP meds   Reports that she had been having itching for "a while."  She has not had itching since discontinuation of her medications.  She was on her BP medications prior to development of itching.   Review of Systems See HPI  Past Medical History  Diagnosis Date  . GERD (gastroesophageal reflux disease)   . Benign microscopic hematuria     neg cystoscopy 11/12- dr. Brunilda Payor    History   Social History  . Marital Status: Married    Spouse Name: N/A    Number of Children: N/A  . Years of Education: N/A   Occupational History  . Not on file.   Social History Main Topics  . Smoking status: Current Every Day Smoker -- 0.50 packs/day for 39 years    Types: Cigarettes    Last Attempt to Quit: 02/25/2012  . Smokeless tobacco: Never Used     Comment: 1 1/2 cigarettes daily  . Alcohol Use: No  . Drug Use: No  . Sexual Activity: Not on file   Other Topics Concern  . Not on file   Social History Narrative   Regular exercise:  No   Caffeine Use:  2 cups coffee and 3-4 glasses of soda daily.   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    Past Surgical History  Procedure Laterality Date  . Knee cartilage surgery  1999    right knee  . Abdominal hysterectomy  1996  . Cervical disc surgery      Dr. Lovell Sheehan  . Rotator cuff repair Right 05/25/13    Family History  Problem Relation Age of Onset  . Hypertension Mother   . Hyperlipidemia Mother   . Heart disease Father   . Diabetes Father   . Hypertension Father   . Hyperlipidemia Father   . Cancer Father     prostate  . Cancer Paternal Aunt     BREAST     Allergies  Allergen Reactions  . Ace Inhibitors     Cough  . Aspirin Nausea And Vomiting  . Azithromycin Rash    Current Outpatient Prescriptions on File Prior to Visit  Medication Sig Dispense Refill  . atorvastatin (LIPITOR) 40 MG tablet Take 1 tablet (40 mg total) by mouth daily.  30 tablet  2  . gabapentin (NEURONTIN) 300 MG capsule Take 1 capsule (300 mg total) by mouth 3 (three) times daily.  90 capsule  3  . meloxicam (MOBIC) 7.5 MG tablet Take 1 tablet (7.5 mg total) by mouth daily.  14 tablet  0  . varenicline (CHANTIX STARTING MONTH PAK) 0.5 MG X 11 & 1 MG X 42 tablet Take one 0.5 mg tablet by mouth once daily for 3 days, then increase to one 0.5 mg tablet twice daily for 4 days, then increase to one 1 mg tablet twice daily.  53 tablet  0   No current facility-administered medications on file prior to visit.    BP 156/92  Pulse 76  Temp(Src) 98 F (36.7 C) (Oral)  Resp 16  Ht 5\' 6"  (1.676 m)  Wt 216 lb (97.977 kg)  BMI 34.88 kg/m2  SpO2 99%  LMP 11/25/1994       Objective:   Physical Exam  Constitutional: She is oriented to person, place, and time. She appears well-developed and well-nourished. No distress.  HENT:  Head: Normocephalic and atraumatic.  Cardiovascular: Normal rate and regular rhythm.   No murmur heard. Pulmonary/Chest: Effort normal and breath sounds normal. No respiratory distress. She has no wheezes. She has no rales. She exhibits no tenderness.  Musculoskeletal: She exhibits no edema.  Neurological: She is alert and oriented to person, place, and time.  Psychiatric: She has a normal mood and affect. Her behavior is normal. Judgment and thought content normal.          Assessment & Plan:

## 2013-09-27 NOTE — Patient Instructions (Signed)
Restart amlodipine 10mg  once daily for several days. If no itching, add in Toprol xl. Call if itching develops. Work hard on diet, exercise, weight loss. Follow up in 3 months.   Fat and Cholesterol Control Diet Cholesterol is a wax-like substance. It comes from your liver and is found in certain foods. There is good (HDL) and bad (LDL) cholesterol. Too much cholesterol in your blood can affect your heart. Certain foods can lower or raise your cholesterol. Eat foods that are low in cholesterol. Saturated and trans fats are bad fats found in foods that will raise your cholesterol. Do not eat foods that are high in saturated and trans fats. FOODS HIGHER IN SATURATED AND TRANS FATS  Dairy products, such as whole milk, eggs, cheese, cream, and butter.  Fatty meats, such as hot dogs, sausage, and salami.  Fried foods.  Trans fats which are found in margarine and pre-made cookies, crackers, and baked goods.  Tropical oils, such as coconut and palm oils. Read package labels at the store. Do not buy products that use saturated or trans fats or hydrogenated oils. Find foods labeled:  Low-fat.  Low-saturated fat.  Trans-fat-free.  Low-cholesterol. FOODS LOWER IN CHOLESTEROL   Fruit.  Vegetables.  Beans, peas, and lentils.  Fish.  Lean meat, such as chicken (without skin) or ground [place].  Grains, such as barley, rice, couscous, bulgur wheat, and pasta.  Heart-healthy tub margarine. PREPARING YOUR FOOD  Broil, bake, steam, or roast foods. Do not fry food.  Use non-stick cooking sprays.  Use lemon or herbs to flavor food instead of using butter or stick margarine.  Use nonfat yogurt, salsa, or low-fat dressings for salads. Document Released: 05/12/2012 Document Reviewed: 05/12/2012 [facility] Patient Information 2014 [place], [place].

## 2013-09-30 DIAGNOSIS — L299 Pruritus, unspecified: Secondary | ICD-10-CM | POA: Insufficient documentation

## 2013-09-30 NOTE — Assessment & Plan Note (Signed)
Not clear if this is med related.  Recommend restart amlodipine 10mg  once daily for several days. If no itching, add in Toprol xl. Call if itching develops.Work hard on diet, exercise, weight loss. Follow up in 3 months.

## 2013-10-06 ENCOUNTER — Encounter: Payer: Self-pay | Admitting: Cardiology

## 2013-10-06 ENCOUNTER — Ambulatory Visit (INDEPENDENT_AMBULATORY_CARE_PROVIDER_SITE_OTHER): Payer: BC Managed Care – PPO | Admitting: Cardiology

## 2013-10-06 VITALS — BP 136/80 | HR 81 | Ht 66.0 in | Wt 217.0 lb

## 2013-10-06 DIAGNOSIS — I10 Essential (primary) hypertension: Secondary | ICD-10-CM

## 2013-10-06 DIAGNOSIS — F17200 Nicotine dependence, unspecified, uncomplicated: Secondary | ICD-10-CM

## 2013-10-06 DIAGNOSIS — Z01818 Encounter for other preprocedural examination: Secondary | ICD-10-CM

## 2013-10-06 DIAGNOSIS — Z720 Tobacco use: Secondary | ICD-10-CM

## 2013-10-06 DIAGNOSIS — R9431 Abnormal electrocardiogram [ECG] [EKG]: Secondary | ICD-10-CM

## 2013-10-06 NOTE — Assessment & Plan Note (Addendum)
Patient counseled on discontinuing. 

## 2013-10-06 NOTE — Progress Notes (Signed)
HPI: 57 year old female for evaluation of abnormal electrocardiogram and preoperative evaluation. Patient had a nuclear study in November of 2012. Ejection fraction 63% and normal perfusion. Patient is planning on having knee replacement. She saw primary care and electrocardiogram showed new inferior and lateral T-wave changes. We were asked to evaluate. She has some dyspnea on exertion and orthopnea, PND or pedal edema. Note she has limited mobility because of pain in her knee. She denies chest pain, palpitations or syncope.  Current Outpatient Prescriptions  Medication Sig Dispense Refill  . amLODipine (NORVASC) 10 MG tablet TAKE 1 TABLET (10 MG TOTAL) BY MOUTH DAILY.      Marland Kitchen gabapentin (NEURONTIN) 300 MG capsule Take 1 capsule (300 mg total) by mouth 3 (three) times daily.  90 capsule  3  . omeprazole (PRILOSEC) 40 MG capsule TAKE 1 CAPSULE (40 MG TOTAL) BY MOUTH DAILY.  30 capsule  2   No current facility-administered medications for this visit.    Allergies  Allergen Reactions  . Ace Inhibitors     Cough  . Aspirin Nausea And Vomiting  . Azithromycin Rash    Past Medical History  Diagnosis Date  . GERD (gastroesophageal reflux disease)   . Benign microscopic hematuria     neg cystoscopy 11/12- dr. Brunilda Payor  . Hypertension   . Hyperlipidemia     Past Surgical History  Procedure Laterality Date  . Knee cartilage surgery  1999    right knee  . Abdominal hysterectomy  1996  . Cervical disc surgery      Dr. Lovell Sheehan  . Rotator cuff repair Right 05/25/13    History   Social History  . Marital Status: Married    Spouse Name: N/A    Number of Children: 3  . Years of Education: N/A   Occupational History  . Not on file.   Social History Main Topics  . Smoking status: Current Every Day Smoker -- 0.50 packs/day for 39 years    Types: Cigarettes  . Smokeless tobacco: Never Used     Comment: 1 1/2 cigarettes daily  . Alcohol Use: No  . Drug Use: No  . Sexual Activity:  Not on file   Other Topics Concern  . Not on file   Social History Narrative   Regular exercise:  No   Caffeine Use:  2 cups coffee and 3-4 glasses of soda daily.   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    Family History  Problem Relation Age of Onset  . Hypertension Mother   . Hyperlipidemia Mother   . Heart disease Father     CAD; stent at age 54  . Diabetes Father   . Hypertension Father   . Hyperlipidemia Father   . Cancer Father     prostate  . Cancer Paternal Aunt     BREAST    ROS: no fevers or chills, productive cough, hemoptysis, dysphasia, odynophagia, melena, hematochezia, dysuria, hematuria, rash, seizure activity, orthopnea, PND, pedal edema, claudication. Remaining systems are negative.  Physical Exam:   Blood pressure 136/80, pulse 81, height 5\' 6"  (1.676 m), weight 217 lb (98.431 kg), last menstrual period 11/25/1994.  General:  Well developed/obese in NAD Skin warm/dry Patient not depressed No peripheral clubbing Back-normal HEENT-normal/normal eyelids Neck supple/normal carotid upstroke bilaterally; no bruits; no JVD; no thyromegaly chest - CTA/ normal expansion CV - RRR/normal S1 and S2; no murmurs, rubs or  gallops;  PMI nondisplaced Abdomen -NT/ND, no HSM, no mass, + bowel sounds, no bruit 2+ femoral pulses, no bruits Ext-no edema, chords, 2+ DP Neuro-grossly nonfocal  ECG 08/17/2013-sinus rhythm, first degree AV block, left ventricular hypertrophy, inferior and lateral T-wave changes.  Electrocardiogram today shows sinus rhythm, PVCs, left anterior fascicular block, right atrial enlargement, nonspecific ST changes.

## 2013-10-06 NOTE — Patient Instructions (Signed)
Your physician recommends that you continue on your current medications as directed. Please refer to the Current Medication list given to you today.   Your physician has requested that you have a lexiscan myoview. For further information please visit [email or web address] Please follow instruction sheet, as given. APPOINTMENT IS Friday, December 5TH @ 7:30 AM     Your physician recommends that you schedule a follow-up appointment AS NEEDED

## 2013-10-06 NOTE — Assessment & Plan Note (Signed)
Patient is scheduled for knee replacement. Her electrocardiogram showed transient T wave inversions in the inferior and lateral leads. She has risk factors including tobacco abuse, hypertension, hyperlipidemia and family history. I will schedule a Myoview for risk stratification.

## 2013-10-06 NOTE — Assessment & Plan Note (Signed)
Plan Myoview.

## 2013-10-06 NOTE — Assessment & Plan Note (Signed)
Pressure controlled. Continue present medications. 

## 2013-10-18 ENCOUNTER — Other Ambulatory Visit: Payer: Self-pay | Admitting: Family

## 2013-10-18 NOTE — Telephone Encounter (Signed)
Pt left message requesting refill of meloxicam.  Last Rx 07/28/2013, #14.  Please advise.

## 2013-10-18 NOTE — Telephone Encounter (Signed)
OK to send #14 tabs

## 2013-10-19 NOTE — Telephone Encounter (Signed)
Refill sent.

## 2013-10-29 ENCOUNTER — Encounter (HOSPITAL_COMMUNITY): Payer: BC Managed Care – PPO

## 2013-11-12 ENCOUNTER — Encounter (HOSPITAL_COMMUNITY): Payer: BC Managed Care – PPO

## 2013-11-29 ENCOUNTER — Other Ambulatory Visit: Payer: Self-pay | Admitting: Family

## 2013-11-29 NOTE — Telephone Encounter (Signed)
eScribe request for refill on Meloxicam Last filled - 11.24.14, #[id number] Last AEX - 11.03.14 Next AEX - 3 months Please Advise/SLS

## 2013-11-30 ENCOUNTER — Ambulatory Visit (HOSPITAL_COMMUNITY): Payer: BC Managed Care – PPO | Attending: Cardiology | Admitting: Radiology

## 2013-11-30 ENCOUNTER — Encounter: Payer: Self-pay | Admitting: Cardiology

## 2013-11-30 VITALS — BP 147/97 | HR 73 | Ht 66.0 in | Wt 214.0 lb

## 2013-11-30 DIAGNOSIS — R0602 Shortness of breath: Secondary | ICD-10-CM

## 2013-11-30 DIAGNOSIS — Z01818 Encounter for other preprocedural examination: Secondary | ICD-10-CM

## 2013-11-30 DIAGNOSIS — Z01810 Encounter for preprocedural cardiovascular examination: Secondary | ICD-10-CM

## 2013-11-30 DIAGNOSIS — R9431 Abnormal electrocardiogram [ECG] [EKG]: Secondary | ICD-10-CM

## 2013-11-30 MED ORDER — TECHNETIUM TC 99M SESTAMIBI GENERIC - CARDIOLITE
33.0000 | Freq: Once | INTRAVENOUS | Status: AC | PRN
  Administered 2013-11-30: 33 via INTRAVENOUS

## 2013-11-30 MED ORDER — REGADENOSON 0.4 MG/5ML IV SOLN
0.4000 mg | Freq: Once | INTRAVENOUS | Status: AC
  Administered 2013-11-30: 0.4 mg via INTRAVENOUS

## 2013-11-30 MED ORDER — TECHNETIUM TC 99M SESTAMIBI GENERIC - CARDIOLITE
11.0000 | Freq: Once | INTRAVENOUS | Status: AC | PRN
  Administered 2013-11-30: 11 via INTRAVENOUS

## 2013-11-30 NOTE — Progress Notes (Signed)
[facility] [address] [place], [place] [address] [phone]    Cardiology Nuclear Med Study  Jade [name] is a 58 y.o. female     MRN : [id number]     DOB: 1956/09/30  Procedure Date: 11/30/2013  Nuclear Med Background Indication for Stress Test:  Evaluation for Ischemia, Surgical Clearance:Knee Replacement with Dr. Tonita Cong and Abnormal EKG:new inferior and lateral T wave changes History:  No known CAD, MPI 2012 (normal) EF 63% Cardiac Risk Factors: Family History - CAD, Hypertension, Lipids and Smoker  Symptoms:  DOE   Nuclear Pre-Procedure Caffeine/Decaff Intake:  10:30pm NPO After: 10:30pm   Lungs:  clear O2 Sat: 98% on room air. IV 0.9% NS with Angio Cath:  22g  IV Site: R Hand  IV Started by:  Matilde Haymaker, RN  Chest Size (in):  38 Cup Size: D  Height: 5\' 6"  (1.676 m)  Weight:  214 lb (97.07 kg)  BMI:  Body mass index is 34.56 kg/(m^2). Tech Comments:  n/a    Nuclear Med Study 1 or 2 day study: 1 day  Stress Test Type:  Lexiscan  Reading MD: n/a  Order Authorizing Provider: Queen Blossom  Resting Radionuclide: Technetium 58m Sestamibi  Resting Radionuclide Dose: 11.0 mCi   Stress Radionuclide:  Technetium 74m Sestamibi  Stress Radionuclide Dose: 33.0 mCi           Stress Protocol Rest HR: 73 Stress HR: 97  Rest BP: 147/94 Stress BP: 157/85  Exercise Time (min): n/a METS: n/a           Dose of Adenosine (mg):  n/a Dose of Lexiscan: 0.4 mg  Dose of Atropine (mg): n/a Dose of Dobutamine: n/a mcg/kg/min (at max HR)  Stress Test Technologist: Glade Lloyd, BS-ES  Nuclear Technologist:  Charlton Amor, CNMT     Rest Procedure:  Myocardial perfusion imaging was performed at rest 45 minutes following the intravenous administration of Technetium 30m Sestamibi. Rest ECG: Normal EKG.  Stress Procedure:  The patient received IV Lexiscan 0.4 mg over 15-seconds.  Technetium 84m Sestamibi injected at 30-seconds.   Quantitative spect images were obtained after a 45 minute delay.  During the infusion of Lexiscan, the patient complained of SOB and had a cough.  These symptoms resolved in recovery.  Stress ECG: No significant change from baseline ECG  QPS Raw Data Images:  Normal; no motion artifact; normal heart/lung ratio. Stress Images:  Normal homogeneous uptake in all areas of the myocardium. Rest Images:  Normal homogeneous uptake in all areas of the myocardium. Subtraction (SDS):  No evidence of ischemia. Transient Ischemic Dilatation (Normal <1.22):  0.97 Lung/Heart Ratio (Normal <0.45):  0.25  Quantitative Gated Spect Images QGS EDV:  n/a QGS ESV:  n/a  Impression Exercise Capacity:  Lexiscan with no exercise. BP Response:  Normal blood pressure response. Clinical Symptoms:  Shortness of breath ECG Impression:  No significant ST segment change suggestive of ischemia. Comparison with Prior Nuclear Study: No images to compare  Overall Impression:  Normal stress nuclear study.  there is no evidence of scar or ischemia. This is a low risk scan.    LV Ejection Fraction: Study not gated.  LV Wall Motion:  Study not gated.  Dola Argyle, MD

## 2013-12-03 ENCOUNTER — Telehealth: Payer: Self-pay | Admitting: Family

## 2013-12-03 NOTE — Telephone Encounter (Signed)
Message copied by Debbrah Alar on Fri Dec 03, 2013  8:43 AM ------      Message from: Lelon Perla      Created: Fri Dec 03, 2013  8:41 AM       Yes      Kirk Ruths            ----- Message -----         From: Debbrah Alar, NP         Sent: 12/03/2013   8:19 AM           To: Lelon Perla, MD            Her Myoview is neg.  Is she clear for TKR from a cardiac standpoint?            Thanks,            Aydenn Gervin       ------

## 2013-12-03 NOTE — Telephone Encounter (Signed)
Message copied by Debbrah Alar on Fri Dec 03, 2013  8:42 AM ------      Message from: Lelon Perla      Created: Fri Dec 03, 2013  8:41 AM       Yes      Kirk Ruths            ----- Message -----         From: Debbrah Alar, NP         Sent: 12/03/2013   8:19 AM           To: Lelon Perla, MD            Her Myoview is neg.  Is she clear for TKR from a cardiac standpoint?            Thanks,            Atreus Hasz       ------

## 2013-12-03 NOTE — Telephone Encounter (Signed)
Clearance faxed to [phone].

## 2013-12-03 NOTE — Telephone Encounter (Signed)
See medical clearance letter for TKA.

## 2013-12-09 ENCOUNTER — Other Ambulatory Visit: Payer: Self-pay | Admitting: Orthopedic Surgery

## 2013-12-27 ENCOUNTER — Encounter: Payer: Self-pay | Admitting: Family

## 2013-12-27 ENCOUNTER — Ambulatory Visit (INDEPENDENT_AMBULATORY_CARE_PROVIDER_SITE_OTHER): Payer: BC Managed Care – PPO | Admitting: Family

## 2013-12-27 VITALS — BP 136/90 | HR 80 | Temp 97.7°F | Resp 16 | Ht 66.0 in | Wt 220.0 lb

## 2013-12-27 DIAGNOSIS — E785 Hyperlipidemia, unspecified: Secondary | ICD-10-CM | POA: Diagnosis present

## 2013-12-27 DIAGNOSIS — R232 Flushing: Secondary | ICD-10-CM | POA: Insufficient documentation

## 2013-12-27 DIAGNOSIS — I10 Essential (primary) hypertension: Secondary | ICD-10-CM

## 2013-12-27 DIAGNOSIS — N951 Menopausal and female climacteric states: Secondary | ICD-10-CM

## 2013-12-27 DIAGNOSIS — L299 Pruritus, unspecified: Secondary | ICD-10-CM

## 2013-12-27 DIAGNOSIS — M549 Dorsalgia, unspecified: Secondary | ICD-10-CM | POA: Insufficient documentation

## 2013-12-27 MED ORDER — GABAPENTIN 300 MG PO CAPS: ORAL_CAPSULE | ORAL | Status: DC

## 2013-12-27 MED ORDER — AMLODIPINE BESYLATE 10 MG PO TABS: 10.0000 mg | ORAL_TABLET | Freq: Every day | ORAL | Status: DC

## 2013-12-27 MED ORDER — MELOXICAM 7.5 MG PO TABS: ORAL_TABLET | ORAL | Status: DC

## 2013-12-27 MED ORDER — OMEPRAZOLE 40 MG PO CPDR: DELAYED_RELEASE_CAPSULE | ORAL | Status: DC

## 2013-12-27 NOTE — Assessment & Plan Note (Signed)
Stable.  Continue current medications.

## 2013-12-27 NOTE — Progress Notes (Signed)
Pre visit review using our clinic review tool, if applicable. No additional management support is needed unless otherwise documented below in the visit note. 

## 2013-12-27 NOTE — Assessment & Plan Note (Signed)
Resolved since patient stopped taking Toprol XL.

## 2013-12-27 NOTE — Assessment & Plan Note (Signed)
Stable. I suspect this is attributed to degeneration to left knee. Patient scheduled for surgery at the end of February.

## 2013-12-27 NOTE — Assessment & Plan Note (Signed)
>>  ASSESSMENT AND PLAN FOR DYSLIPIDEMIA WRITTEN ON 12/27/2013 11:19 AM BY CLARK, KATHERINE K  Elevated LDL's and triglycerides. Patient no longer taking Lipitor  due to feeling funny. Will obtain fasting lipid panel tomorrow. Will restart lipid lowering treatment if results are abnormal.

## 2013-12-27 NOTE — Patient Instructions (Signed)
Please return fasting for cholesterol blood test. Increase gabapentin to 2 tablets at bedtime for night sweats/hot flashes. Speak to your GYN re: estrogen therapy. Follow up in three months.

## 2013-12-27 NOTE — Assessment & Plan Note (Signed)
Elevated LDL's and triglycerides. Patient no longer taking Lipitor due to "feeling funny." Will obtain fasting lipid panel tomorrow. Will restart lipid lowering treatment if results are abnormal.

## 2013-12-27 NOTE — Assessment & Plan Note (Signed)
She is now off of estrogen. I advised her to try to remain off of estrogen at this point, but will ultimately defer to her GYN.  In the meantime, increase HS gabapentin from 300mg  to 600mg  to see if this helps with night sweats.

## 2013-12-27 NOTE — Progress Notes (Signed)
Subjective:    Patient ID: Jade [name], female    DOB: Jul 16, 1956, 58 y.o.   MRN: [id number]  Hypertension Pertinent negatives include no chest pain or shortness of breath.  Hyperlipidemia Pertinent negatives include no chest pain or shortness of breath.  Back Pain Pertinent negatives include no chest pain, dysuria or fever.   Jade [name] is a 58 year old female who presents today for follow up. Patient reports she had several episodes of itching after taking her blood pressure medication at nights.  Patient reports she is taking the amlodipine 10mg  and is no longer taking the toprol XL. Patient denies any further episodes of itching since discontinuation of Toprol XL.    Back pain) Patient presents reporting with bilateral lower back pain x 1 week. Denies recent injury. Patient denies dysuria, hematuria, frequency, urgency. Patient has a left knee arthrocscopy scheduled for end of February due to knee degeneration.   Hot flashes) Patient reports intermittent episdoes hot flashes ever since she stopped taking her Premarin three months ago. Hepatectomy in 1996.  Hyperlipidemia) Patient currently not taking Lipitor for elevated LDL's and total cholesterol. Patient will come to the office tomorrow for fasting lipid panel.   Review of Systems  Constitutional: Negative for fever.  HENT: Negative for rhinorrhea.   Respiratory: Negative for cough and shortness of breath.   Cardiovascular: Negative for chest pain.  Genitourinary: Negative for dysuria, urgency and difficulty urinating.  Musculoskeletal: Positive for back pain.       Past Medical History  Diagnosis Date  . GERD (gastroesophageal reflux disease)   . Benign microscopic hematuria     neg cystoscopy 11/12- dr. Janice Norrie  . Hypertension   . Hyperlipidemia     History   Social History  . Marital Status: Married    Spouse Name: N/A    Number of Children: 3  . Years of Education: N/A   Occupational History  . Not on  file.   Social History Main Topics  . Smoking status: Current Every Day Smoker -- 0.50 packs/day for 39 years    Types: Cigarettes  . Smokeless tobacco: Never Used     Comment: 1 1/2 cigarettes daily  . Alcohol Use: No  . Drug Use: No  . Sexual Activity: Not on file   Other Topics Concern  . Not on file   Social History Narrative   Regular exercise:  No   Caffeine Use:  2 cups coffee and 3-4 glasses of soda daily.   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    Past Surgical History  Procedure Laterality Date  . Knee cartilage surgery  1999    right knee  . Abdominal hysterectomy  1996  . Cervical disc surgery      Dr. Arnoldo Morale  . Rotator cuff repair Right 05/25/13    Family History  Problem Relation Age of Onset  . Hypertension Mother   . Hyperlipidemia Mother   . Heart disease Father     CAD; stent at age 33  . Diabetes Father   . Hypertension Father   . Hyperlipidemia Father   . Cancer Father     prostate  . Cancer Paternal Aunt     BREAST    Allergies  Allergen Reactions  . Ace Inhibitors     Cough  . Aspirin Nausea And Vomiting  . Azithromycin Rash    No current outpatient prescriptions on  file prior to visit.   No current facility-administered medications on file prior to visit.    BP 136/90  Pulse 80  Temp(Src) 97.7 F (36.5 C) (Oral)  Resp 16  Ht 5\' 6"  (1.676 m)  Wt 220 lb 0.6 oz (99.809 kg)  BMI 35.53 kg/m2  SpO2 100%  LMP 11/25/1994    Objective:   Physical Exam  Constitutional: She is oriented to person, place, and time. She appears well-nourished.  HENT:  Head: Normocephalic.  Cardiovascular: Normal rate, regular rhythm and normal heart sounds.   Pulmonary/Chest: Effort normal and breath sounds normal. No respiratory distress. She has no wheezes.  Musculoskeletal: She exhibits edema.  Mild edema noted to right ankle. DP and PT 2+ bilaterally.  Neurological: She is alert  and oriented to person, place, and time.  Skin: Skin is warm and dry.  Psychiatric: She has a normal mood and affect.          Assessment & Plan:  I have personally seen and examined patient and agree with Jerrel Ivory NP student's assessment and plan.

## 2013-12-28 ENCOUNTER — Telehealth: Payer: Self-pay | Admitting: Family

## 2013-12-28 NOTE — Telephone Encounter (Signed)
Relevant patient education mailed to patient.  

## 2013-12-29 ENCOUNTER — Telehealth: Payer: Self-pay | Admitting: Family

## 2013-12-29 NOTE — Telephone Encounter (Signed)
Relevant patient education mailed to patient.  

## 2014-01-11 ENCOUNTER — Other Ambulatory Visit: Payer: Self-pay | Admitting: Orthopedic Surgery

## 2014-01-11 ENCOUNTER — Encounter (HOSPITAL_COMMUNITY): Payer: Self-pay | Admitting: *Deleted

## 2014-01-11 NOTE — H&P (Signed)
Jade [name] DOB: 01/04/1956 Married / Language: English / Race: Black or African American Female  H&P date: 01/11/14  Chief Complaint: L knee pain  History of Present Illness The patient is a 57 year old female who comes in today for a preoperative History and Physical. The patient is scheduled for a left total knee arthroplasty to be performed by Dr. Jeffrey C. Beane, MD at [facility] on 01/20/2014. Pt with severe B/L knee DJD, end-stage, left more symptomatic than right consistently. Pain refractory to bracing, steroid injections, pain medications, HEP, activity modifications, relative rest. We again discussed surgery itself as well as risks, complications, and alternatives. 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  Dr. Beane and the pt mutually agreed to proceed with a total knee replacement. Risks and benefits of the procedure were discussed including stiffness, suboptimal range of motion, persistent pain, infection requiring removal of prosthesis and reinsertion, need for prophylactic antibiotics in the future, for example, dental procedures, possible need for manipulation, revision in the future and also anesthetic complications including DVT, PE, etc. We discussed the perioperative course, time in the hospital, postoperative recovery and the need for elevation to control swelling. We also discussed the predicted range of motion and the probability that squatting and kneeling would be unobtainable in the future. In addition, postoperative anticoagulation was discussed. We will obtain preoperative medical clearance if necessary- [place]. Provided her illustrated handout and discussed it in detail. They will enroll in the total joint replacement educational forum at the hospital.  Allergies Zithromax Z-Pak *MACROLIDES* Aspirin *ANALGESICS - NonNarcotic*. upset stomach, can tolerate for short periods  Family History Hypertension. mother and  father First Degree Relatives. reported Heart Disease. father and grandmother mothers side Diabetes Mellitus. First Degree Relatives. father  Social History Number of flights of stairs before winded. 1 Alcohol use. former drinker Marital status. married Drug/Alcohol Rehab (Previously). no Illicit drug use. no Living situation. live with spouse Drug/Alcohol Rehab (Currently). no Children. 3 Current every day smoker Current work status. unemployed Tobacco use. Current every day smoker. current every day smoker Post-Surgical Plans. home with HHPT. one level home with 3 steps to enter  Medication History Robaxin (500MG Tablet, 1 (one) Tablet Oral every eight hours, as needed, Taken starting 11/26/2013) Active. AmLODIPine Besylate (10MG Tablet, Oral) Active. Gabapentin (300MG Capsule, Oral) Active. Omeprazole (40MG Capsule DR, Oral) Active. Premarin (0.625MG Tablet, Oral) Active. Ibuprofen (800MG Tablet, Oral) Active. Mobic ( Oral) Specific dose unknown - Active.  Pregnancy / Birth History Pregnant. no  Past Surgical History Neck Disc Surgery Hysterectomy. partial (non-cancerous) Arthroscopy of Knee. right Rotator Cuff Repair - Right  Past Medical Hx Gastroesophageal Reflux Disease High blood pressure Dentures Menopause  Review of Systems General:Present- Night Sweats. Not Present- Chills, Fever, Fatigue, Weight Gain, Weight Loss and Memory Loss. Skin:Not Present- Hives, Itching, Rash, Eczema and Lesions. HEENT:Not Present- Tinnitus, Headache, Double Vision, Visual Loss, Hearing Loss and Dentures. Respiratory:Not Present- Shortness of breath with exertion, Shortness of breath at rest, Allergies, Coughing up blood and Chronic Cough. Cardiovascular:Not Present- Chest Pain, Racing/skipping heartbeats, Difficulty Breathing Lying Down, Murmur, Swelling and Palpitations. Gastrointestinal:Not Present- Bloody Stool, Heartburn, Abdominal Pain, Vomiting,  Nausea, Constipation, Diarrhea, Difficulty Swallowing, Jaundice and Loss of appetitie. Female Genitourinary:Present- Painful Urination. Not Present- Blood in Urine, Urinary frequency, Weak urinary stream, Discharge, Flank Pain, Incontinence, Urgency, Urinary Retention and Urinating at Night. Musculoskeletal:Present- Joint Pain, Back Pain, Morning Stiffness and Spasms. Not Present- Muscle Weakness, Muscle Pain and Joint Swelling.   Neurological:Not Present- Tremor, Dizziness, Blackout spells, Paralysis, Difficulty with balance and Weakness. Psychiatric:Not Present- Insomnia.  Physical Exam The physical exam findings are as follows:  General Mental Status - Alert, cooperative and good historian. General Appearance- pleasant. Not in acute distress. Orientation- Oriented X3. Build & Nutrition- Well nourished and Well developed. Gait- Antalgic.  Head and Neck Head- normocephalic, atraumatic . Neck Global Assessment- supple. no bruit auscultated on the right and no bruit auscultated on the left.  Eye Pupil- Bilateral- Regular and Round. Motion- Bilateral- EOMI.  Chest and Lung Exam Auscultation: Breath sounds:- clear at anterior chest wall and - clear at posterior chest wall. Adventitious sounds:- No Adventitious sounds.  Cardiovascular Auscultation:Rhythm- Regular rate and rhythm. Heart Sounds- S1 WNL and S2 WNL. Murmurs & Other Heart Sounds:Auscultation of the heart reveals - No Murmurs.  Abdomen Palpation/Percussion:Tenderness- Abdomen is non-tender to palpation. Rigidity (guarding)- Abdomen is soft. Auscultation:Auscultation of the abdomen reveals - Bowel sounds normal.  Female Genitourinary Not done, not pertinent to present illness  Musculoskeletal On exam severe distress. Walks with an antalgic gait. Varus deformity on the left. She is exquisitely tender medial joint line. Patellofemoral pain with compression. Range is -5 to 100.  Ipsilateral hip and ankle exams are unremarkable. She is neurovascularly intact. Straight leg raise produces low back pain, minor buttock pain.  Xrays AP standing and lateral demonstrates bone on bone arthrosis, varus deformity, patellofemoral arthrosis bilaterally.  Assessment & Plan DJD Left knee  Pt with severe B/L knee DJD, left more painful than right, scheduled for left TKA by Dr. Tonita Cong on 01/20/14. She has been cleared by [facility] for surgery. We again discussed the procedure itself as well as risks, complications and alternatives including but not limited to DVT, PE, infx, bleeding, failure of procedure, need for secondary procedure, anesthesia risk, even death. Discussed possible need for manipulation. Discussed post-op protocols, time out of work, need for PT, DVT/dental/infx ppx, activity modifications. All questions answered and she desires to proceed as scheduled. She will hold mobic and all other NSAIDs accodingly pre-op. She will present to [facility] for pre-op testing, number given for her to call them to set up appt. Plan kefzol and clinda pre-op. She has her MRSA decolonization Rx's. Plan Percocet, Robaxin, Xarelto, Colace for D/C home. Plan D/C home with HHPT. She will follow up 10-14 days post-op for suture removal and xrays.  Plan left total knee replacement  Signed electronically by Cecilie Kicks, PA-C for Dr. Tonita Cong

## 2014-01-12 ENCOUNTER — Other Ambulatory Visit (HOSPITAL_COMMUNITY): Payer: Self-pay | Admitting: *Deleted

## 2014-01-12 ENCOUNTER — Encounter (HOSPITAL_COMMUNITY): Payer: Self-pay | Admitting: Pharmacy Technician

## 2014-01-12 NOTE — Patient Instructions (Addendum)
Jade [name]  01/12/2014                           YOUR PROCEDURE IS SCHEDULED ON: 01/20/14               PLEASE REPORT TO SHORT STAY CENTER AT : 5:15 AM               CALL THIS NUMBER IF ANY PROBLEMS THE DAY OF SURGERY :               [phone]                                REMEMBER:   Do not eat food or drink liquids AFTER MIDNIGHT      Take these medicines the morning of surgery with A SIP OF WATER:  AMLODIPINE / GABAPENTIN / PRILOSEC / HYDROCODONE IF NEEDED   Do not wear jewelry, make-up   Do not wear lotions, powders, or perfumes.   Do not shave legs or underarms 12 hrs. before surgery (men may shave face)  Do not bring valuables to the hospital.  Contacts, dentures or bridgework may not be worn into surgery.  Leave suitcase in the car. After surgery it may be brought to your room.  For patients admitted to the hospital more than one night, checkout time is 11:00  AM                                                       The day of discharge.   Patients discharged the day of surgery will not be allowed to drive home.             If going home same day of surgery, must have someone stay with you              FIRST 24 hrs at home and arrange for some one to drive you home from  hospital.    Special Instructions             Please read over the following fact sheets that you were given:               1. [place]               2. INCENTIVE SPIROMETER               3. MRSA INFORMATION SHEET                                                X_____________________________________________________________________        Failure to follow these instructions may result in cancellation of your surgery

## 2014-01-13 ENCOUNTER — Encounter (HOSPITAL_COMMUNITY)
Admission: RE | Admit: 2014-01-13 | Discharge: 2014-01-13 | Disposition: A | Discharge status: Home / Self Care | Payer: BC Managed Care – PPO | Source: Ambulatory Visit | Attending: Specialist | Admitting: Specialist

## 2014-01-13 ENCOUNTER — Ambulatory Visit (HOSPITAL_COMMUNITY)
Admission: RE | Admit: 2014-01-13 | Discharge: 2014-01-13 | Disposition: A | Discharge status: Home / Self Care | Payer: BC Managed Care – PPO | Source: Ambulatory Visit | Attending: Orthopedic Surgery | Admitting: Orthopedic Surgery

## 2014-01-13 ENCOUNTER — Encounter (HOSPITAL_COMMUNITY): Payer: Self-pay

## 2014-01-13 DIAGNOSIS — Z01818 Encounter for other preprocedural examination: Secondary | ICD-10-CM | POA: Insufficient documentation

## 2014-01-13 DIAGNOSIS — M1710 Unilateral primary osteoarthritis, unspecified knee: Secondary | ICD-10-CM | POA: Insufficient documentation

## 2014-01-13 DIAGNOSIS — Z01812 Encounter for preprocedural laboratory examination: Secondary | ICD-10-CM | POA: Insufficient documentation

## 2014-01-13 HISTORY — DX: Paresthesia of skin: R20.2

## 2014-01-13 HISTORY — DX: Anesthesia of skin: R20.0

## 2014-01-13 HISTORY — DX: Unspecified osteoarthritis, unspecified site: M19.90

## 2014-01-13 LAB — URINALYSIS, ROUTINE W REFLEX MICROSCOPIC
Glucose, UA: NEGATIVE mg/dL
Hgb urine dipstick: NEGATIVE
Ketones, ur: NEGATIVE mg/dL
Leukocytes, UA: NEGATIVE
Nitrite: NEGATIVE
Protein, ur: NEGATIVE mg/dL
Specific Gravity, Urine: 1.039 — ABNORMAL HIGH (ref 1.005–1.030)
Urobilinogen, UA: 1 mg/dL (ref 0.0–1.0)
pH: 5.5 (ref 5.0–8.0)

## 2014-01-13 LAB — CBC
HCT: 40.5 % (ref 36.0–46.0)
Hemoglobin: 13.7 g/dL (ref 12.0–15.0)
MCH: 27.5 pg (ref 26.0–34.0)
MCHC: 33.8 g/dL (ref 30.0–36.0)
MCV: 81.3 fL (ref 78.0–100.0)
Platelets: 286 10*3/uL (ref 150–400)
RBC: 4.98 MIL/uL (ref 3.87–5.11)
RDW: 13.7 % (ref 11.5–15.5)
WBC: 7.9 10*3/uL (ref 4.0–10.5)

## 2014-01-13 LAB — ABO/RH: ABO/RH(D): O POS

## 2014-01-13 LAB — BASIC METABOLIC PANEL
BUN: 16 mg/dL (ref 6–23)
CO2: 26 mEq/L (ref 19–32)
Calcium: 9.3 mg/dL (ref 8.4–10.5)
Chloride: 105 mEq/L (ref 96–112)
Creatinine, Ser: 0.74 mg/dL (ref 0.50–1.10)
GFR calc Af Amer: >90 mL/min (ref >90)
GFR calc non Af Amer: >90 mL/min (ref >90)
Glucose, Bld: 122 mg/dL — ABNORMAL HIGH (ref 70–99)
Potassium: 3.4 mEq/L — ABNORMAL LOW (ref 3.7–5.3)
Sodium: 143 mEq/L (ref 137–147)

## 2014-01-13 LAB — PROTIME-INR
INR: 0.98 (ref 0.00–1.49)
Prothrombin Time: 12.8 seconds (ref 11.6–15.2)

## 2014-01-13 LAB — APTT: aPTT: 29 seconds (ref 24–37)

## 2014-01-13 NOTE — Progress Notes (Signed)
   Subjective:    Patient ID: Jade [name], female    DOB: 01/15/1956, 58 y.o.   MRN: [id number]  HPI    Review of Systems     Objective:   Physical Exam        Assessment & Plan:  Addendum:  Pre-op RN did OSA screen and pt screened as increased risk.  Plan to discuss possibility of home sleep study with pt next visit.

## 2014-01-13 NOTE — Progress Notes (Signed)
01/13/14 1138  OBSTRUCTIVE SLEEP APNEA  Have you ever been diagnosed with sleep apnea through a sleep study? No  Do you snore loudly (loud enough to be heard through closed doors)?  1  Do you often feel tired, fatigued, or sleepy during the daytime? 1  Has anyone observed you stop breathing during your sleep? 0  Do you have, or are you being treated for high blood pressure? 1  BMI more than 35 kg/m2? 1  Age over 60 years old? 1  Neck circumference greater than 40 cm/18 inches? 0  Gender: 0  Obstructive Sleep Apnea Score 5  Score 4 or greater  Results sent to PCP

## 2014-01-20 ENCOUNTER — Inpatient Hospital Stay (HOSPITAL_COMMUNITY): Payer: BC Managed Care – PPO

## 2014-01-20 ENCOUNTER — Encounter (HOSPITAL_COMMUNITY): Payer: Self-pay

## 2014-01-20 ENCOUNTER — Encounter (HOSPITAL_COMMUNITY)
Admission: RE | Disposition: A | Discharge status: Home Health | Payer: Self-pay | Source: Ambulatory Visit | Attending: Specialist

## 2014-01-20 ENCOUNTER — Inpatient Hospital Stay (HOSPITAL_COMMUNITY): Payer: BC Managed Care – PPO | Admitting: Anesthesiology

## 2014-01-20 ENCOUNTER — Inpatient Hospital Stay (HOSPITAL_COMMUNITY)
Admission: RE | Admit: 2014-01-20 | Discharge: 2014-01-23 | DRG: 470 | Disposition: A | Discharge status: Home Health | Payer: BC Managed Care – PPO | Source: Ambulatory Visit | Attending: Specialist | Admitting: Specialist

## 2014-01-20 ENCOUNTER — Encounter (HOSPITAL_COMMUNITY): Payer: BC Managed Care – PPO | Admitting: Anesthesiology

## 2014-01-20 DIAGNOSIS — M898X9 Other specified disorders of bone, unspecified site: Secondary | ICD-10-CM | POA: Diagnosis present

## 2014-01-20 DIAGNOSIS — Z8249 Family history of ischemic heart disease and other diseases of the circulatory system: Secondary | ICD-10-CM

## 2014-01-20 DIAGNOSIS — K219 Gastro-esophageal reflux disease without esophagitis: Secondary | ICD-10-CM | POA: Diagnosis present

## 2014-01-20 DIAGNOSIS — M1710 Unilateral primary osteoarthritis, unspecified knee: Principal | ICD-10-CM | POA: Diagnosis present

## 2014-01-20 DIAGNOSIS — M179 Osteoarthritis of knee, unspecified: Secondary | ICD-10-CM

## 2014-01-20 DIAGNOSIS — M1712 Unilateral primary osteoarthritis, left knee: Secondary | ICD-10-CM

## 2014-01-20 DIAGNOSIS — Z791 Long term (current) use of non-steroidal anti-inflammatories (NSAID): Secondary | ICD-10-CM

## 2014-01-20 DIAGNOSIS — Z881 Allergy status to other antibiotic agents status: Secondary | ICD-10-CM

## 2014-01-20 DIAGNOSIS — Z886 Allergy status to analgesic agent status: Secondary | ICD-10-CM

## 2014-01-20 DIAGNOSIS — F17200 Nicotine dependence, unspecified, uncomplicated: Secondary | ICD-10-CM | POA: Diagnosis present

## 2014-01-20 DIAGNOSIS — Z79899 Other long term (current) drug therapy: Secondary | ICD-10-CM

## 2014-01-20 DIAGNOSIS — I10 Essential (primary) hypertension: Secondary | ICD-10-CM | POA: Diagnosis present

## 2014-01-20 HISTORY — DX: Family history of other specified conditions: Z84.89

## 2014-01-20 HISTORY — PX: TOTAL KNEE ARTHROPLASTY: SHX125

## 2014-01-20 LAB — TYPE AND SCREEN
ABO/RH(D): O POS
Antibody Screen: NEGATIVE

## 2014-01-20 SURGERY — ARTHROPLASTY, KNEE, TOTAL
Anesthesia: Spinal | Site: Knee | Laterality: Left

## 2014-01-20 MED ORDER — BUPIVACAINE-EPINEPHRINE PF 0.5-1:200000 % IJ SOLN
INTRAMUSCULAR | Status: AC
  Filled 2014-01-20: qty 30

## 2014-01-20 MED ORDER — LACTATED RINGERS IV SOLN
INTRAVENOUS | Status: DC
  Administered 2014-01-20 (×2): via INTRAVENOUS

## 2014-01-20 MED ORDER — ACETAMINOPHEN 650 MG RE SUPP: 650.0000 mg | Freq: Four times a day (QID) | RECTAL | Status: DC | PRN

## 2014-01-20 MED ORDER — CHLORHEXIDINE GLUCONATE 4 % EX LIQD: 60.0000 mL | Freq: Once | CUTANEOUS | Status: DC

## 2014-01-20 MED ORDER — RIVAROXABAN 10 MG PO TABS: 10.0000 mg | ORAL_TABLET | Freq: Every day | ORAL | Status: DC

## 2014-01-20 MED ORDER — ONDANSETRON HCL 4 MG/2ML IJ SOLN
INTRAMUSCULAR | Status: AC
  Filled 2014-01-20: qty 2

## 2014-01-20 MED ORDER — ONDANSETRON HCL 4 MG/2ML IJ SOLN: 4.0000 mg | Freq: Four times a day (QID) | INTRAMUSCULAR | Status: DC | PRN

## 2014-01-20 MED ORDER — PHENOL 1.4 % MT LIQD: 1.0000 | OROMUCOSAL | Status: DC | PRN

## 2014-01-20 MED ORDER — RIVAROXABAN 10 MG PO TABS
10.0000 mg | ORAL_TABLET | Freq: Every day | ORAL | Status: DC
  Administered 2014-01-21 – 2014-01-23 (×3): 10 mg via ORAL
  Filled 2014-01-20 (×4): qty 1

## 2014-01-20 MED ORDER — FENTANYL CITRATE 0.05 MG/ML IJ SOLN
INTRAMUSCULAR | Status: DC | PRN
  Administered 2014-01-20: 50 ug via INTRAVENOUS

## 2014-01-20 MED ORDER — OXYCODONE HCL 5 MG PO TABS
5.0000 mg | ORAL_TABLET | ORAL | Status: DC | PRN
  Administered 2014-01-20 – 2014-01-23 (×21): 10 mg via ORAL
  Filled 2014-01-20 (×21): qty 2

## 2014-01-20 MED ORDER — PHENYLEPHRINE HCL 10 MG/ML IJ SOLN
INTRAMUSCULAR | Status: AC
  Filled 2014-01-20: qty 1

## 2014-01-20 MED ORDER — PROPOFOL 10 MG/ML IV BOLUS
INTRAVENOUS | Status: AC
  Filled 2014-01-20: qty 20

## 2014-01-20 MED ORDER — PHENYLEPHRINE 40 MCG/ML (10ML) SYRINGE FOR IV PUSH (FOR BLOOD PRESSURE SUPPORT)
PREFILLED_SYRINGE | INTRAVENOUS | Status: AC
  Filled 2014-01-20: qty 10

## 2014-01-20 MED ORDER — SODIUM CHLORIDE 0.45 % IV SOLN
INTRAVENOUS | Status: AC
  Administered 2014-01-20: 14:00:00 via INTRAVENOUS

## 2014-01-20 MED ORDER — MIDAZOLAM HCL 5 MG/5ML IJ SOLN
INTRAMUSCULAR | Status: DC | PRN
  Administered 2014-01-20 (×2): 1 mg via INTRAVENOUS

## 2014-01-20 MED ORDER — HYDROMORPHONE HCL PF 1 MG/ML IJ SOLN
1.0000 mg | INTRAMUSCULAR | Status: DC | PRN
  Administered 2014-01-20: 1 mg via INTRAVENOUS
  Filled 2014-01-20: qty 1

## 2014-01-20 MED ORDER — DEXAMETHASONE SODIUM PHOSPHATE 10 MG/ML IJ SOLN
INTRAMUSCULAR | Status: AC
  Filled 2014-01-20: qty 1

## 2014-01-20 MED ORDER — PROMETHAZINE HCL 25 MG/ML IJ SOLN: 6.2500 mg | INTRAMUSCULAR | Status: DC | PRN

## 2014-01-20 MED ORDER — CLINDAMYCIN PHOSPHATE 900 MG/50ML IV SOLN
900.0000 mg | INTRAVENOUS | Status: AC
Start: 2014-01-20 — End: 2014-01-20
  Administered 2014-01-20: 900 mg via INTRAVENOUS

## 2014-01-20 MED ORDER — ONDANSETRON HCL 4 MG/2ML IJ SOLN
INTRAMUSCULAR | Status: DC | PRN
  Administered 2014-01-20: 4 mg via INTRAVENOUS

## 2014-01-20 MED ORDER — DEXAMETHASONE SODIUM PHOSPHATE 10 MG/ML IJ SOLN
INTRAMUSCULAR | Status: DC | PRN
  Administered 2014-01-20: 10 mg via INTRAVENOUS

## 2014-01-20 MED ORDER — PANTOPRAZOLE SODIUM 40 MG PO TBEC
40.0000 mg | DELAYED_RELEASE_TABLET | Freq: Every day | ORAL | Status: DC
  Administered 2014-01-21 – 2014-01-23 (×3): 40 mg via ORAL
  Filled 2014-01-20 (×4): qty 1

## 2014-01-20 MED ORDER — GABAPENTIN 300 MG PO CAPS
600.0000 mg | ORAL_CAPSULE | Freq: Every day | ORAL | Status: DC
  Administered 2014-01-20 – 2014-01-22 (×3): 600 mg via ORAL
  Filled 2014-01-20 (×4): qty 2

## 2014-01-20 MED ORDER — BUPIVACAINE-EPINEPHRINE (PF) 0.5% -1:200000 IJ SOLN
INTRAMUSCULAR | Status: DC | PRN
  Administered 2014-01-20: 15 mL

## 2014-01-20 MED ORDER — SODIUM CHLORIDE 0.9 % IR SOLN
Status: DC | PRN
  Administered 2014-01-20: 08:00:00

## 2014-01-20 MED ORDER — METOCLOPRAMIDE HCL 5 MG/ML IJ SOLN: 5.0000 mg | Freq: Three times a day (TID) | INTRAMUSCULAR | Status: DC | PRN

## 2014-01-20 MED ORDER — METOCLOPRAMIDE HCL 10 MG PO TABS: 5.0000 mg | ORAL_TABLET | Freq: Three times a day (TID) | ORAL | Status: DC | PRN

## 2014-01-20 MED ORDER — SODIUM CHLORIDE 0.9 % IJ SOLN
INTRAMUSCULAR | Status: AC
  Filled 2014-01-20: qty 50

## 2014-01-20 MED ORDER — CEFAZOLIN SODIUM-DEXTROSE 2-3 GM-% IV SOLR
INTRAVENOUS | Status: AC
  Filled 2014-01-20: qty 50

## 2014-01-20 MED ORDER — MEPERIDINE HCL 50 MG/ML IJ SOLN: 6.2500 mg | INTRAMUSCULAR | Status: DC | PRN

## 2014-01-20 MED ORDER — OXYCODONE-ACETAMINOPHEN 5-325 MG PO TABS: 1.0000 | ORAL_TABLET | ORAL | Status: DC | PRN

## 2014-01-20 MED ORDER — CHLORHEXIDINE GLUCONATE CLOTH 2 % EX PADS: 6.0000 | MEDICATED_PAD | Freq: Once | CUTANEOUS | Status: DC

## 2014-01-20 MED ORDER — METHOCARBAMOL 500 MG PO TABS: 500.0000 mg | ORAL_TABLET | Freq: Three times a day (TID) | ORAL | Status: DC | PRN

## 2014-01-20 MED ORDER — METHOCARBAMOL 100 MG/ML IJ SOLN
500.0000 mg | Freq: Four times a day (QID) | INTRAVENOUS | Status: DC | PRN
  Administered 2014-01-20: 500 mg via INTRAVENOUS
  Filled 2014-01-20: qty 5

## 2014-01-20 MED ORDER — CLINDAMYCIN PHOSPHATE 900 MG/50ML IV SOLN
INTRAVENOUS | Status: AC
  Filled 2014-01-20: qty 50

## 2014-01-20 MED ORDER — BUPIVACAINE LIPOSOME 1.3 % IJ SUSP
20.0000 mL | Freq: Once | INTRAMUSCULAR | Status: AC
  Administered 2014-01-20: 20 mL
  Filled 2014-01-20: qty 20

## 2014-01-20 MED ORDER — OXYCODONE HCL 5 MG/5ML PO SOLN
5.0000 mg | Freq: Once | ORAL | Status: DC | PRN
  Filled 2014-01-20: qty 5

## 2014-01-20 MED ORDER — CEFAZOLIN SODIUM-DEXTROSE 2-3 GM-% IV SOLR
2.0000 g | Freq: Four times a day (QID) | INTRAVENOUS | Status: AC
  Administered 2014-01-20 – 2014-01-21 (×3): 2 g via INTRAVENOUS
  Filled 2014-01-20 (×3): qty 50

## 2014-01-20 MED ORDER — PROPOFOL 10 MG/ML IV BOLUS
INTRAVENOUS | Status: DC | PRN
  Administered 2014-01-20: 20 mg via INTRAVENOUS

## 2014-01-20 MED ORDER — CEFAZOLIN SODIUM-DEXTROSE 2-3 GM-% IV SOLR
2.0000 g | INTRAVENOUS | Status: AC
  Administered 2014-01-20: 2 g via INTRAVENOUS

## 2014-01-20 MED ORDER — ONDANSETRON HCL 4 MG PO TABS: 4.0000 mg | ORAL_TABLET | Freq: Four times a day (QID) | ORAL | Status: DC | PRN

## 2014-01-20 MED ORDER — CLINDAMYCIN PHOSPHATE 900 MG/50ML IV SOLN
900.0000 mg | Freq: Three times a day (TID) | INTRAVENOUS | Status: AC
  Administered 2014-01-20: 900 mg via INTRAVENOUS
  Filled 2014-01-20: qty 50

## 2014-01-20 MED ORDER — PROPOFOL INFUSION 10 MG/ML OPTIME
INTRAVENOUS | Status: DC | PRN
  Administered 2014-01-20: 120 ug/kg/min via INTRAVENOUS

## 2014-01-20 MED ORDER — MENTHOL 3 MG MT LOZG
1.0000 | LOZENGE | OROMUCOSAL | Status: DC | PRN
  Filled 2014-01-20: qty 9

## 2014-01-20 MED ORDER — DOCUSATE SODIUM 100 MG PO CAPS
100.0000 mg | ORAL_CAPSULE | Freq: Two times a day (BID) | ORAL | Status: DC
  Administered 2014-01-20 – 2014-01-23 (×6): 100 mg via ORAL

## 2014-01-20 MED ORDER — GABAPENTIN 300 MG PO CAPS
300.0000 mg | ORAL_CAPSULE | Freq: Two times a day (BID) | ORAL | Status: DC
  Administered 2014-01-21 – 2014-01-23 (×6): 300 mg via ORAL
  Filled 2014-01-20 (×7): qty 1

## 2014-01-20 MED ORDER — BUPIVACAINE HCL (PF) 0.5 % IJ SOLN
INTRAMUSCULAR | Status: AC
  Filled 2014-01-20: qty 30

## 2014-01-20 MED ORDER — MIDAZOLAM HCL 2 MG/2ML IJ SOLN
INTRAMUSCULAR | Status: AC
  Filled 2014-01-20: qty 2

## 2014-01-20 MED ORDER — HYDROMORPHONE HCL PF 1 MG/ML IJ SOLN: 0.2500 mg | INTRAMUSCULAR | Status: DC | PRN

## 2014-01-20 MED ORDER — METHOCARBAMOL 500 MG PO TABS
500.0000 mg | ORAL_TABLET | Freq: Four times a day (QID) | ORAL | Status: DC | PRN
  Administered 2014-01-20 – 2014-01-23 (×6): 500 mg via ORAL
  Filled 2014-01-20 (×6): qty 1

## 2014-01-20 MED ORDER — FENTANYL CITRATE 0.05 MG/ML IJ SOLN
INTRAMUSCULAR | Status: AC
Start: 2014-01-20 — End: 2014-01-20
  Filled 2014-01-20: qty 5

## 2014-01-20 MED ORDER — ACETAMINOPHEN 325 MG PO TABS: 650.0000 mg | ORAL_TABLET | Freq: Four times a day (QID) | ORAL | Status: DC | PRN

## 2014-01-20 MED ORDER — DOCUSATE SODIUM 100 MG PO CAPS: 100.0000 mg | ORAL_CAPSULE | Freq: Two times a day (BID) | ORAL | Status: DC | PRN

## 2014-01-20 MED ORDER — OXYCODONE HCL 5 MG PO TABS: 5.0000 mg | ORAL_TABLET | Freq: Once | ORAL | Status: DC | PRN

## 2014-01-20 MED ORDER — AMLODIPINE BESYLATE 10 MG PO TABS
10.0000 mg | ORAL_TABLET | Freq: Every morning | ORAL | Status: DC
  Administered 2014-01-21 – 2014-01-23 (×3): 10 mg via ORAL
  Filled 2014-01-20 (×3): qty 1

## 2014-01-20 SURGICAL SUPPLY — 66 items
BAG ZIPLOCK 12X15 (MISCELLANEOUS) ×2 IMPLANT
BANDAGE ELASTIC 4 VELCRO ST LF (GAUZE/BANDAGES/DRESSINGS) ×2 IMPLANT
BANDAGE ELASTIC 6 VELCRO ST LF (GAUZE/BANDAGES/DRESSINGS) ×2 IMPLANT
BANDAGE ESMARK 6X9 LF (GAUZE/BANDAGES/DRESSINGS) ×1 IMPLANT
BLADE SAG 18X100X1.27 (BLADE) ×2 IMPLANT
BLADE SAW SGTL 13.0X1.19X90.0M (BLADE) ×2 IMPLANT
BNDG ESMARK 6X9 LF (GAUZE/BANDAGES/DRESSINGS) ×2
CAPT RP KNEE ×2 IMPLANT
CEMENT HV SMART SET (Cement) ×4 IMPLANT
CHLORAPREP W/TINT 26ML (MISCELLANEOUS) IMPLANT
CLOTH 2% CHLOROHEXIDINE 3PK (PERSONAL CARE ITEMS) ×2 IMPLANT
CUFF TOURN SGL QUICK 34 (TOURNIQUET CUFF) ×1
CUFF TRNQT CYL 34X4X40X1 (TOURNIQUET CUFF) ×1 IMPLANT
DECANTER SPIKE VIAL GLASS SM (MISCELLANEOUS) IMPLANT
DRAPE LG THREE QUARTER DISP (DRAPES) ×2 IMPLANT
DRAPE ORTHO SPLIT 77X108 STRL (DRAPES) ×2
DRAPE POUCH INSTRU U-SHP 10X18 (DRAPES) ×2 IMPLANT
DRAPE SURG ORHT 6 SPLT 77X108 (DRAPES) ×2 IMPLANT
DRAPE U-SHAPE 47X51 STRL (DRAPES) ×2 IMPLANT
DRSG ADAPTIC 3X8 NADH LF (GAUZE/BANDAGES/DRESSINGS) IMPLANT
DRSG AQUACEL AG ADV 3.5X10 (GAUZE/BANDAGES/DRESSINGS) ×2 IMPLANT
DRSG TEGADERM 4X4.75 (GAUZE/BANDAGES/DRESSINGS) ×2 IMPLANT
DURAPREP 26ML APPLICATOR (WOUND CARE) ×2 IMPLANT
ELECT BLADE TIP CTD 4 INCH (ELECTRODE) ×2 IMPLANT
ELECT REM PT RETURN 9FT ADLT (ELECTROSURGICAL) ×2
ELECTRODE REM PT RTRN 9FT ADLT (ELECTROSURGICAL) ×1 IMPLANT
EVACUATOR 1/8 PVC DRAIN (DRAIN) ×2 IMPLANT
FACESHIELD LNG OPTICON STERILE (SAFETY) ×10 IMPLANT
GAUZE SPONGE 2X2 8PLY STRL LF (GAUZE/BANDAGES/DRESSINGS) ×1 IMPLANT
GLOVE BIOGEL PI IND STRL 7.5 (GLOVE) ×1 IMPLANT
GLOVE BIOGEL PI IND STRL 8 (GLOVE) ×1 IMPLANT
GLOVE BIOGEL PI INDICATOR 7.5 (GLOVE) ×1
GLOVE BIOGEL PI INDICATOR 8 (GLOVE) ×1
GLOVE SURG SS PI 7.5 STRL IVOR (GLOVE) ×2 IMPLANT
GLOVE SURG SS PI 8.0 STRL IVOR (GLOVE) ×4 IMPLANT
GOWN STRL REUS W/TWL XL LVL3 (GOWN DISPOSABLE) ×4 IMPLANT
HANDPIECE INTERPULSE COAX TIP (DISPOSABLE) ×1
IMMOBILIZER KNEE 20 (SOFTGOODS) ×2 IMPLANT
KIT BASIN OR (CUSTOM PROCEDURE TRAY) ×2 IMPLANT
MANIFOLD NEPTUNE II (INSTRUMENTS) ×2 IMPLANT
NEEDLE HYPO 22GX1.5 SAFETY (NEEDLE) ×4 IMPLANT
NS IRRIG 1000ML POUR BTL (IV SOLUTION) IMPLANT
PACK TOTAL JOINT (CUSTOM PROCEDURE TRAY) ×2 IMPLANT
PAD ABD 8X10 STRL (GAUZE/BANDAGES/DRESSINGS) IMPLANT
PADDING CAST COTTON 6X4 STRL (CAST SUPPLIES) IMPLANT
POSITIONER SURGICAL ARM (MISCELLANEOUS) ×2 IMPLANT
SET HNDPC FAN SPRY TIP SCT (DISPOSABLE) ×1 IMPLANT
SPONGE GAUZE 2X2 STER 10/PKG (GAUZE/BANDAGES/DRESSINGS) ×1
SPONGE GAUZE 4X4 12PLY (GAUZE/BANDAGES/DRESSINGS) IMPLANT
SPONGE SURGIFOAM ABS GEL 100 (HEMOSTASIS) IMPLANT
STAPLER VISISTAT (STAPLE) IMPLANT
STRIP CLOSURE SKIN 1/2X4 (GAUZE/BANDAGES/DRESSINGS) ×2 IMPLANT
SUCTION FRAZIER 12FR DISP (SUCTIONS) ×2 IMPLANT
SUT BONE WAX W31G (SUTURE) IMPLANT
SUT MNCRL AB 4-0 PS2 18 (SUTURE) IMPLANT
SUT VIC AB 1 CT1 27 (SUTURE) ×1
SUT VIC AB 1 CT1 27XBRD ANTBC (SUTURE) ×1 IMPLANT
SUT VIC AB 2-0 CT1 27 (SUTURE) ×3
SUT VIC AB 2-0 CT1 TAPERPNT 27 (SUTURE) ×3 IMPLANT
SUT VLOC 180 0 24IN GS25 (SUTURE) ×2 IMPLANT
SYR 20CC LL (SYRINGE) ×4 IMPLANT
TOWEL OR 17X26 10 PK STRL BLUE (TOWEL DISPOSABLE) ×2 IMPLANT
TOWER CARTRIDGE SMART MIX (DISPOSABLE) ×2 IMPLANT
TRAY FOLEY CATH 14FRSI W/METER (CATHETERS) ×2 IMPLANT
WATER STERILE IRR 1500ML POUR (IV SOLUTION) ×4 IMPLANT
WRAP KNEE MAXI GEL POST OP (GAUZE/BANDAGES/DRESSINGS) ×2 IMPLANT

## 2014-01-20 NOTE — Progress Notes (Signed)
PT Cancellation Note  Patient Details Name: Jade [name] MRN: [id number] DOB: Feb 18, 1956   Cancelled Treatment:     POD 0 EV deferred at request of RN 2* pt pain level.  Will follow in am.   Alannie Amodio 01/20/2014, 3:55 PM

## 2014-01-20 NOTE — Preoperative (Signed)
Beta Blockers   Reason not to administer Beta Blockers:Not Applicable 

## 2014-01-20 NOTE — H&P (View-Only) (Signed)
Iven Finn DOB: 1956/10/14 Married / Language: English / Race: Black or African American Female  H&P date: 01/11/14  Chief Complaint: L knee pain  History of Present Illness The patient is a 58 year old female who comes in today for a preoperative History and Physical. The patient is scheduled for a left total knee arthroplasty to be performed by Dr. Johnn Hai, MD at [facility] on 01/20/2014. Pt with severe B/L knee DJD, end-stage, left more symptomatic than right consistently. Pain refractory to bracing, steroid injections, pain medications, HEP, activity modifications, relative rest. We again discussed surgery itself as well as risks, complications, and alternatives. 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  Dr. Tonita Cong and the pt mutually agreed to proceed with a total knee replacement. Risks and benefits of the procedure were discussed including stiffness, suboptimal range of motion, persistent pain, infection requiring removal of prosthesis and reinsertion, need for prophylactic antibiotics in the future, for example, dental procedures, possible need for manipulation, revision in the future and also anesthetic complications including DVT, PE, etc. We discussed the perioperative course, time in the hospital, postoperative recovery and the need for elevation to control swelling. We also discussed the predicted range of motion and the probability that squatting and kneeling would be unobtainable in the future. In addition, postoperative anticoagulation was discussed. We will obtain preoperative medical clearance if necessary- [place]. Provided her illustrated handout and discussed it in detail. They will enroll in the total joint replacement educational forum at the hospital.  Allergies Zithromax Z-Pak *MACROLIDES* Aspirin *ANALGESICS - NonNarcotic*. upset stomach, can tolerate for short periods  Family History Hypertension. mother and  father First Degree Relatives. reported Heart Disease. father and grandmother mothers side Diabetes Mellitus. First Degree Relatives. father  Social History Number of flights of stairs before winded. 1 Alcohol use. former drinker Marital status. married Drug/Alcohol Rehab (Previously). no Illicit drug use. no Living situation. live with spouse Drug/Alcohol Rehab (Currently). no Children. 3 Current every day smoker Current work status. unemployed Tobacco use. Current every day smoker. current every day smoker Post-Surgical Plans. home with HHPT. one level home with 3 steps to enter  Medication History Robaxin (500MG  Tablet, 1 (one) Tablet Oral every eight hours, as needed, Taken starting 11/26/2013) Active. AmLODIPine Besylate (10MG  Tablet, Oral) Active. Gabapentin (300MG  Capsule, Oral) Active. Omeprazole (40MG  Capsule DR, Oral) Active. Premarin (0.625MG  Tablet, Oral) Active. Ibuprofen (800MG  Tablet, Oral) Active. Mobic ( Oral) Specific dose unknown - Active.  Pregnancy / Birth History Pregnant. no  Past Surgical History Neck Disc Surgery Hysterectomy. partial (non-cancerous) Arthroscopy of Knee. right Rotator Cuff Repair - Right  Past Medical Hx Gastroesophageal Reflux Disease High blood pressure Dentures Menopause  Review of Systems General:Present- Night Sweats. Not Present- Chills, Fever, Fatigue, Weight Gain, Weight Loss and Memory Loss. Skin:Not Present- Hives, Itching, Rash, Eczema and Lesions. HEENT:Not Present- Tinnitus, Headache, Double Vision, Visual Loss, Hearing Loss and Dentures. Respiratory:Not Present- Shortness of breath with exertion, Shortness of breath at rest, Allergies, Coughing up blood and Chronic Cough. Cardiovascular:Not Present- Chest Pain, Racing/skipping heartbeats, Difficulty Breathing Lying Down, Murmur, Swelling and Palpitations. Gastrointestinal:Not Present- Bloody Stool, Heartburn, Abdominal Pain, Vomiting,  Nausea, Constipation, Diarrhea, Difficulty Swallowing, Jaundice and Loss of appetitie. Female Genitourinary:Present- Painful Urination. Not Present- Blood in Urine, Urinary frequency, Weak urinary stream, Discharge, Flank Pain, Incontinence, Urgency, Urinary Retention and Urinating at Night. Musculoskeletal:Present- Joint Pain, Back Pain, Morning Stiffness and Spasms. Not Present- Muscle Weakness, Muscle Pain and Joint Swelling.  Neurological:Not Present- Tremor, Dizziness, Blackout spells, Paralysis, Difficulty with balance and Weakness. Psychiatric:Not Present- Insomnia.  Physical Exam The physical exam findings are as follows:  General Mental Status - Alert, cooperative and good historian. General Appearance- pleasant. Not in acute distress. Orientation- Oriented X3. Build & Nutrition- Well nourished and Well developed. Gait- Antalgic.  Head and Neck Head- normocephalic, atraumatic . Neck Global Assessment- supple. no bruit auscultated on the right and no bruit auscultated on the left.  Eye Pupil- Bilateral- Regular and Round. Motion- Bilateral- EOMI.  Chest and Lung Exam Auscultation: Breath sounds:- clear at anterior chest wall and - clear at posterior chest wall. Adventitious sounds:- No Adventitious sounds.  Cardiovascular Auscultation:Rhythm- Regular rate and rhythm. Heart Sounds- S1 WNL and S2 WNL. Murmurs & Other Heart Sounds:Auscultation of the heart reveals - No Murmurs.  Abdomen Palpation/Percussion:Tenderness- Abdomen is non-tender to palpation. Rigidity (guarding)- Abdomen is soft. Auscultation:Auscultation of the abdomen reveals - Bowel sounds normal.  Female Genitourinary Not done, not pertinent to present illness  Musculoskeletal On exam severe distress. Walks with an antalgic gait. Varus deformity on the left. She is exquisitely tender medial joint line. Patellofemoral pain with compression. Range is -5 to 100.  Ipsilateral hip and ankle exams are unremarkable. She is neurovascularly intact. Straight leg raise produces low back pain, minor buttock pain.  Xrays AP standing and lateral demonstrates bone on bone arthrosis, varus deformity, patellofemoral arthrosis bilaterally.  Assessment & Plan DJD Left knee  Pt with severe B/L knee DJD, left more painful than right, scheduled for left TKA by Dr. Tonita Cong on 01/20/14. She has been cleared by [facility] for surgery. We again discussed the procedure itself as well as risks, complications and alternatives including but not limited to DVT, PE, infx, bleeding, failure of procedure, need for secondary procedure, anesthesia risk, even death. Discussed possible need for manipulation. Discussed post-op protocols, time out of work, need for PT, DVT/dental/infx ppx, activity modifications. All questions answered and she desires to proceed as scheduled. She will hold mobic and all other NSAIDs accodingly pre-op. She will present to [facility] for pre-op testing, number given for her to call them to set up appt. Plan kefzol and clinda pre-op. She has her MRSA decolonization Rx's. Plan Percocet, Robaxin, Xarelto, Colace for D/C home. Plan D/C home with HHPT. She will follow up 10-14 days post-op for suture removal and xrays.  Plan left total knee replacement  Signed electronically by Cecilie Kicks, PA-C for Dr. Tonita Cong

## 2014-01-20 NOTE — Transfer of Care (Signed)
Immediate Anesthesia Transfer of Care Note  Patient: Jade [name]  Procedure(s) Performed: Procedure(s): LEFT TOTAL KNEE ARTHROPLASTY (Left)  Patient Location: PACU  Anesthesia Type:Spinal  Level of Consciousness: awake, alert  and patient cooperative  Airway & Oxygen Therapy: Patient Spontanous Breathing and Patient connected to face mask oxygen  Post-op Assessment: Report given to PACU RN and Post -op Vital signs reviewed and stable  Post vital signs: Reviewed and stable  Complications: No apparent anesthesia complications

## 2014-01-20 NOTE — Brief Op Note (Signed)
01/20/2014  9:40 AM  PATIENT:  Jade [name]  58 y.o. female  PRE-OPERATIVE DIAGNOSIS:  DJD Left Knee  POST-OPERATIVE DIAGNOSIS:  DJD Left Knee  PROCEDURE:  Procedure(s): LEFT TOTAL KNEE ARTHROPLASTY (Left)  SURGEON:  Surgeon(s) and Role:    * Johnn Hai, MD - Primary  PHYSICIAN ASSISTANT:   ASSISTANTS: Bissell   ANESTHESIA:   general  EBL:  Total I/O In: 1000 [I.V.:1000] Out: 400 [Urine:400]  BLOOD ADMINISTERED:none  DRAINS: none   LOCAL MEDICATIONS USED:  MARCAINE     SPECIMEN:  No Specimen  DISPOSITION OF SPECIMEN:  N/A  COUNTS:  YES  TOURNIQUET:  * Missing tourniquet times found for documented tourniquets in log:  [id number] *  DICTATION: .Viviann Spare Dictation [phone]  PLAN OF CARE: Admit to inpatient   PATIENT DISPOSITION:  PACU - hemodynamically stable.   Delay start of Pharmacological VTE agent (>24hrs) due to surgical blood loss or risk of bleeding: no

## 2014-01-20 NOTE — Interval H&P Note (Signed)
History and Physical Interval Note:  01/20/2014 7:12 AM  Jade [name]  has presented today for surgery, with the diagnosis of DJD Left Knee  The various methods of treatment have been discussed with the patient and family. After consideration of risks, benefits and other options for treatment, the patient has consented to  Procedure(s): LEFT TOTAL KNEE ARTHROPLASTY (Left) as a surgical intervention .  The patient's history has been reviewed, patient examined, no change in status, stable for surgery.  I have reviewed the patient's chart and labs.  Questions were answered to the patient's satisfaction.     [name]

## 2014-01-20 NOTE — Anesthesia Postprocedure Evaluation (Signed)
Anesthesia Post Note  Patient: Jade [name]  Procedure(s) Performed: Procedure(s) (LRB): LEFT TOTAL KNEE ARTHROPLASTY (Left)  Anesthesia type: Spinal  Patient location: PACU  Post pain: Pain level controlled  Post assessment: Post-op Vital signs reviewed  Last Vitals: BP 153/91  Pulse 87  Temp(Src) 36.4 C  Resp 16  SpO2 100%  LMP 11/25/1994  Post vital signs: Reviewed  Level of consciousness: sedated  Complications: No apparent anesthesia complications

## 2014-01-20 NOTE — Plan of Care (Signed)
Problem: Consults Goal: Diagnosis- Total Joint Replacement Left total knee     

## 2014-01-20 NOTE — Anesthesia Preprocedure Evaluation (Addendum)
Anesthesia Evaluation  Patient identified by MRN, date of birth, ID band Patient awake    Reviewed: Allergy & Precautions, H&P , NPO status , Patient's Chart, lab work & pertinent test results  History of Anesthesia Complications (+) Family history of anesthesia reaction and history of anesthetic complications  Airway Mallampati: II TM Distance: >3 FB Neck ROM: Full    Dental  (+) Dental Advisory Given   Pulmonary shortness of breath, Current Smoker,          Cardiovascular hypertension, Pt. on medications Rhythm:Regular Rate:Normal     Neuro/Psych negative neurological ROS  negative psych ROS   GI/Hepatic Neg liver ROS, GERD-  Medicated,  Endo/Other  negative endocrine ROS  Renal/GU negative Renal ROS     Musculoskeletal negative musculoskeletal ROS (+)   Abdominal (+) + obese,   Peds  Hematology negative hematology ROS (+)   Anesthesia Other Findings   Reproductive/Obstetrics negative OB ROS                          Anesthesia Physical Anesthesia Plan  ASA: III  Anesthesia Plan: Spinal   Post-op Pain Management:    Induction:   Airway Management Planned:   Additional Equipment:   Intra-op Plan:   Post-operative Plan:   Informed Consent:   Dental advisory given  Plan Discussed with: CRNA  Anesthesia Plan Comments:        Anesthesia Quick Evaluation

## 2014-01-20 NOTE — Anesthesia Procedure Notes (Signed)
Spinal  Patient location during procedure: OR Start time: 01/20/2014 7:33 AM End time: 01/20/2014 7:37 AM Staffing Anesthesiologist: Nolon Nations R Performed by: anesthesiologist  Preanesthetic Checklist Completed: patient identified, site marked, surgical consent, pre-op evaluation, timeout performed, IV checked, risks and benefits discussed and monitors and equipment checked Spinal Block Patient position: sitting Prep: ChloraPrep and site prepped and draped Patient monitoring: heart rate, continuous pulse ox and blood pressure Approach: midline Location: L2-3 Injection technique: single-shot Needle Needle type: Sprotte  Needle gauge: 24 G Needle length: 9 cm Assessment Sensory level: T8 Additional Notes Expiration date of kit checked and confirmed. Patient tolerated procedure well, without complications.

## 2014-01-21 ENCOUNTER — Encounter (HOSPITAL_COMMUNITY): Payer: Self-pay | Admitting: Specialist

## 2014-01-21 LAB — BASIC METABOLIC PANEL
BUN: 7 mg/dL (ref 6–23)
CO2: 25 mEq/L (ref 19–32)
Calcium: 9.1 mg/dL (ref 8.4–10.5)
Chloride: 97 mEq/L (ref 96–112)
Creatinine, Ser: 0.53 mg/dL (ref 0.50–1.10)
GFR calc Af Amer: >90 mL/min (ref >90)
GFR calc non Af Amer: >90 mL/min (ref >90)
Glucose, Bld: 126 mg/dL — ABNORMAL HIGH (ref 70–99)
Potassium: 3.4 mEq/L — ABNORMAL LOW (ref 3.7–5.3)
Sodium: 136 mEq/L — ABNORMAL LOW (ref 137–147)

## 2014-01-21 LAB — CBC
HCT: 36.6 % (ref 36.0–46.0)
Hemoglobin: 12.3 g/dL (ref 12.0–15.0)
MCH: 27.1 pg (ref 26.0–34.0)
MCHC: 33.6 g/dL (ref 30.0–36.0)
MCV: 80.6 fL (ref 78.0–100.0)
Platelets: 282 10*3/uL (ref 150–400)
RBC: 4.54 MIL/uL (ref 3.87–5.11)
RDW: 13.7 % (ref 11.5–15.5)
WBC: 14.7 10*3/uL — ABNORMAL HIGH (ref 4.0–10.5)

## 2014-01-21 NOTE — Progress Notes (Signed)
[facility]  [facility] is providing the following services: RW and Commode (patient would like to hold off on the Tub Bench)  If patient discharges after hours, please call [phone].   Linward Headland 01/21/2014, 11:04 AM

## 2014-01-21 NOTE — Progress Notes (Signed)
CSW consulted for SNF placement. PN notes reviewed. PT is recommending HHPT following hospital d/c. RNCM will assist with d/c planning.  Werner Lean LCSW [phone]

## 2014-01-21 NOTE — Discharge Instructions (Signed)
Elevate leg above heart 6x a day for 20minutes each °Use knee immobilizer while walking until can SLR x 10 °Use knee immobilizer in bed to keep knee in extension °Aquacel dressing may remain in place for 7 days. May shower with aquacel dressing in place. After 7 days, remove aquacel dressing. Do not remove steri-strips if they are present. Place new dressing with gauze and tape or ACE bandage which should be kept clean and dry and changed daily. °============================================== °Information on my medicine - XARELTO® (Rivaroxaban) ° °This medication education was reviewed with me or my healthcare representative as part of my discharge preparation.  The pharmacist that spoke with me during my hospital stay was:  Netta Fodge K, RPH ° °Why was Xarelto® prescribed for you? °Xarelto® was prescribed for you to reduce the risk of blood clots forming after orthopedic surgery. The medical term for these abnormal blood clots is venous thromboembolism (VTE). ° °What do you need to know about xarelto® ? °Take your Xarelto® ONCE DAILY at the same time every day. °You may take it either with or without food. ° °If you have difficulty swallowing the tablet whole, you may crush it and mix in applesauce just prior to taking your dose. ° °Take Xarelto® exactly as prescribed by your doctor and DO NOT stop taking Xarelto® without talking to the doctor who prescribed the medication.  Stopping without other VTE prevention medication to take the place of Xarelto® may increase your risk of developing a clot. ° °After discharge, you should have regular check-up appointments with your healthcare provider that is prescribing your Xarelto®.   ° °What do you do if you miss a dose? °If you miss a dose, take it as soon as you remember on the same day then continue your regularly scheduled once daily regimen the next day. Do not take two doses of Xarelto® on the same day.  ° °Important Safety Information °A possible side effect  of Xarelto® is bleeding. You should call your healthcare provider right away if you experience any of the following: °  Bleeding from an injury or your nose that does not stop. °  Unusual colored urine (red or dark brown) or unusual colored stools (red or black). °  Unusual bruising for unknown reasons. °  A serious fall or if you hit your head (even if there is no bleeding). ° °Some medicines may interact with Xarelto® and might increase your risk of bleeding while on Xarelto®. To help avoid this, consult your healthcare provider or pharmacist prior to using any new prescription or non-prescription medications, including herbals, vitamins, non-steroidal anti-inflammatory drugs (NSAIDs) and supplements. ° °This website has more information on Xarelto®: www.xarelto.com. ° ° °

## 2014-01-21 NOTE — Progress Notes (Signed)
Physical Therapy Treatment Patient Details Name: Jade [name] MRN: [id number] DOB: June 08, 1956 Today's Date: 01/21/2014 Time: 9735-3299 PT Time Calculation (min): 23 min  PT Assessmen / Plan / Recommendation  History of Present Illness     PT Comments   Improvement in activity tolerance from am session with decreased c/o dizziness and better pain control  Follow Up Recommendations  Home health PT     Does the patient have the potential to tolerate intense rehabilitation     Barriers to Discharge        Equipment Recommendations  Rolling walker with 5" wheels    Recommendations for Other Services OT consult  Frequency 7X/week   Progress towards PT Goals Progress towards PT goals: Progressing toward goals  Plan Current plan remains appropriate    Precautions / Restrictions Precautions Precautions: Knee;Fall Required Braces or Orthoses: Knee Immobilizer - Left Knee Immobilizer - Left: Discontinue once straight leg raise with < 10 degree lag Restrictions Weight Bearing Restrictions: No Other Position/Activity Restrictions: WBAT   Pertinent Vitals/Pain 5/10; MEDS requested, ice pack provided    Mobility  Bed Mobility Overal bed mobility: Needs Assistance Bed Mobility: Sit to Supine Supine to sit: Mod assist Sit to supine: Mod assist General bed mobility comments: cues for sequence and use of R LE to self assist Transfers Overall transfer level: Needs assistance Equipment used: Rolling walker (2 wheeled) Transfers: Sit to/from Stand Sit to Stand: Mod assist;+2 physical assistance General transfer comment: cues for LE management and use of UEs to self assist Ambulation/Gait Ambulation/Gait assistance: +2 physical assistance;Min assist;Mod assist Ambulation Distance (Feet): 34 Feet (and 3) Assistive device: Rolling walker (2 wheeled) Gait Pattern/deviations: Step-to pattern;Decreased step length - right;Decreased step length - left;Shuffle;Antalgic;Trunk flexed Gait  velocity: decr General Gait Details: cues for sequence, posture, position from RW    Exercises Total Joint Exercises Ankle Circles/Pumps: AROM;Both;15 reps;Supine Quad Sets: AROM;Both;10 reps;Supine Heel Slides: AAROM;Left;10 reps;Supine Straight Leg Raises: AAROM;Left;10 reps;Supine   PT Diagnosis: Difficulty walking  PT Problem List: Decreased strength;Decreased range of motion;Decreased activity tolerance;Decreased balance;Decreased mobility;Decreased knowledge of use of DME;Obesity;Pain PT Treatment Interventions: DME instruction;Gait training;Stair training;Functional mobility training;Therapeutic activities;Therapeutic exercise;Patient/family education   PT Goals (current goals can now be found in the care plan section) Acute Rehab PT Goals Patient Stated Goal: Resume previous lifestyle with decreased pain PT Goal Formulation: With patient Time For Goal Achievement: 01/27/14 Potential to Achieve Goals: Good  Visit Information  Last PT Received On: 01/21/14 Assistance Needed: +2    Subjective Data  Subjective: Doing better than this morning Patient Stated Goal: Resume previous lifestyle with decreased pain   Cognition  Cognition Arousal/Alertness: Awake/alert Behavior During Therapy: WFL for tasks assessed/performed Overall Cognitive Status: Within Functional Limits for tasks assessed    Balance     End of Session PT - End of Session Equipment Utilized During Treatment: Gait belt;Left knee immobilizer Activity Tolerance: Patient tolerated treatment well Patient left: in bed;with call bell/phone within reach;with nursing/sitter in room Nurse Communication: Mobility status CPM Left Knee CPM Left Knee: Off   GP     Irven Ingalsbe 01/21/2014, 3:37 PM

## 2014-01-21 NOTE — Op Note (Signed)
[name], Jade [name]              ACCOUNT NO.:  [id number]  MEDICAL RECORD NO.:  [id number]  LOCATION:  108                         FACILITY:  [facility]  PHYSICIAN:  Jade [name], M.D.    DATE OF BIRTH:  1956-01-18  DATE OF PROCEDURE:  01/20/2014 DATE OF DISCHARGE:                              OPERATIVE REPORT   PREOPERATIVE DIAGNOSIS:  End-stage degenerative joint disease of the left knee.  POSTOPERATIVE DIAGNOSIS:  End-stage degenerative joint disease of the left knee.  PROCEDURE PERFORMED:  Left total knee arthroplasty.  COMPONENTS USED:  DePuy rotating platform, 3 tibia, 3 femur, 15 insert, and 38 patella.  HISTORY:  This is a 58 year old with bone-on-bone arthrosis, medial compartment, varus deformity, refractory conservative treatment, including rest activity modification, corticosteroid injection, had severe limitations or activities of daily living due to pain with ambulation, indicated for replacement of the degenerated joint.  Risk and benefits discussed including bleeding, infection, damage to neurovascular structures, suboptimal range of motion, DVT, PE, anesthetic complications, etc,.  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  Left lower extremity was prepped and draped in usual sterile fashion.  Thigh tourniquet inflated to 300 mmHg after exsanguination.  Midline incision was made.  Full thickness flap is developed over the knee.  Median parapatellar arthrotomy was performed.  Patella and everted knee flexed.  We elevated the soft tissues medially preserving the MCL attachment.  Knee flexed. Tricompartmental osteoporosis bone-on-bone was noted particularly over the patellofemoral joint and the medial compartment.  Osteophytes were removed with rongeur.  Medial and lateral menisci and remnants of ACL were removed.  The  geniculate was cauterized.  Step drill was utilized to enter the femoral canal, irrigated, 5 degree left was placed with 11 off the distal femur.  We pinned it and cut off the distal femur and we sized the distal femur.  It was very close to a 3, it was pinned.  We performed anterior, posterior, and chamfer cuts.  I did not notch the cortex.  I felt this was satisfactory and turned our attention towards the tibia.  I placed __________ flexed the knee.  External alignment guide, 4 off the defect, which was medial, pinned parallel to the tibial shaft, and bisecting the tibiotalar joint in line with the first and second __________.  Slope was satisfactory, pinned, and an oscillating saw performed is cut without difficulty.  We then used a block for flexion and extension gaps and they were equivalent.  Then, turned our attention towards completing the tibia, sized to a 3 just to the medial aspect of the tibial tubercle maximizing the coverage and pinned.  We centrally drilled and a thin guide, impacted into place.  We then completed the femur.  A  box cut guide was then pinned, bisecting the intercondylar notch, where we performed a box cut.  We then placed a trial of 3 femur and 3 tibia.  A 10 mm insert was slightly loose.  We used a 12.5 and that seemed to be the better fit.  We then turned attention towards the  patella, we had patellar clamp that was utilized to remove 9.5.  This was 24 prior to that, and we planned it to a 14. We then medialized our drill holes for a 38 and drilled 3 PEG holes for the patella.  Placed a patellar component and assessed patellofemoral tracking, it was excellent.  We then removed all our trials.  Inspected posteriorly.  Removed a small loose body.  Debrided some of the fat pad. Copiously irrigated with pulsatile lavage.  Flexed the knee, all surfaces thoroughly dried, mixed cement on the back table.  Then, injected it under pressure into the tibial canal,  digitally pressurizing it.  Then, we impacted the 3 tibia, cemented the femur, impacted the femur, placed a 12.5 insert, reduced it and held an axial load throughout the curing of the cement, redundant cement removed.  We cemented the patellar button as well 38.  After curing of the cement, we flexed the knee and redundant cement was removed with an osteotome meticulously.  We felt that in flexion with an anterior drawer, there was slight excursion.  We felt 15 would be better.  We tried a 15, she had full extension, full flexion, stable in flexion and then chose the 15 after copious irrigation with antibiotic irrigation, reduced the 15, full extension, full flexion, good stability with varus valgus stressing at 0 and 30 degrees.  Excellent patellofemoral tracking.  Negative anterior drawer.  I then placed a Hemovac and brought out through a lateral stab wound and skin.  We used Marcaine and epinephrine and anesthetized periosteum.  We repaired the patellar arthrotomy with 1 Vicryl interrupted figure-of-eight sutures for approximation.  Then running V-Loc in slight flexion, subcu with 2-0, and skin with 4-0 subcuticular.  She had flexion to gravity at 90 degrees and excellent patellofemoral tracking, and excellent stability, sterile dressing applied.  Tourniquet was deflated and there was adequate revascularization of lower extremity appreciated.  The patient tolerated the procedure well.  No complications.  Assistant, Jade Dunker, PA, which was absolutely necessary for assistance, exposure, suturing.  Minimum blood loss.  Tourniquet time was an hour and 10 minutes.     Jade [name], M.D.     Jade [name]  D:  01/20/2014  T:  01/20/2014  Job:  [id number]

## 2014-01-21 NOTE — Evaluation (Signed)
Physical Therapy Evaluation Patient Details Name: Jade [name] MRN: [id number] DOB: 02/11/1956 Today's Date: 01/21/2014 Time: 5009-3818 PT Time Calculation (min): 31 min  PT Assessment / Plan / Recommendation History of Present Illness     Clinical Impression  Pt s/p L TKR presents with decreased L LE strength/ROM and post op pain limiting functional mobility.  Pt should progress to d/c home with family assist and HHPT follow up.    PT Assessment  Patient needs continued PT services    Follow Up Recommendations  Home health PT    Does the patient have the potential to tolerate intense rehabilitation      Barriers to Discharge        Equipment Recommendations  Rolling walker with 5" wheels    Recommendations for Other Services OT consult   Frequency 7X/week    Precautions / Restrictions Precautions Precautions: Knee;Fall Required Braces or Orthoses: Knee Immobilizer - Left Knee Immobilizer - Left: Discontinue once straight leg raise with < 10 degree lag Restrictions Weight Bearing Restrictions: No Other Position/Activity Restrictions: WBAT   Pertinent Vitals/Pain 6/10; premed, cold packs provided      Mobility  Bed Mobility Overal bed mobility: Needs Assistance Bed Mobility: Supine to Sit Supine to sit: Mod assist General bed mobility comments: cues for sequence and use of R LE to self assist Transfers Overall transfer level: Needs assistance Equipment used: Rolling walker (2 wheeled) Transfers: Sit to/from Stand Sit to Stand: Mod assist;+2 physical assistance General transfer comment: cues for LE management and use of UEs to self assist Ambulation/Gait Ambulation/Gait assistance: Mod assist;+2 physical assistance Ambulation Distance (Feet): 7 Feet (and 3) Assistive device: Rolling walker (2 wheeled) Gait Pattern/deviations: Step-to pattern;Decreased step length - right;Decreased step length - left;Shuffle;Antalgic;Trunk flexed General Gait Details: cues  for sequence, posture, position from RW    Exercises Total Joint Exercises Ankle Circles/Pumps: AROM;Both;15 reps;Supine Quad Sets: AROM;Both;10 reps;Supine Heel Slides: AAROM;Left;10 reps;Supine Straight Leg Raises: AAROM;Left;10 reps;Supine   PT Diagnosis: Difficulty walking  PT Problem List: Decreased strength;Decreased range of motion;Decreased activity tolerance;Decreased balance;Decreased mobility;Decreased knowledge of use of DME;Obesity;Pain PT Treatment Interventions: DME instruction;Gait training;Stair training;Functional mobility training;Therapeutic activities;Therapeutic exercise;Patient/family education     PT Goals(Current goals can be found in the care plan section) Acute Rehab PT Goals Patient Stated Goal: Resume previous lifestyle with decreased pain PT Goal Formulation: With patient Time For Goal Achievement: 01/27/14 Potential to Achieve Goals: Good  Visit Information  Last PT Received On: 01/21/14 Assistance Needed: +2       Prior [place] expects to be discharged to:: Private residence Living Arrangements: Spouse/significant other Available Help at Discharge: Family Type of Home: House Home Access: Stairs to enter Technical brewer of Steps: 2 Entrance Stairs-Rails: None Home Layout: One level Home Equipment: None Prior Function Level of Independence: Independent Communication Communication: No difficulties    Cognition  Cognition Arousal/Alertness: Awake/alert Behavior During Therapy: WFL for tasks assessed/performed Overall Cognitive Status: Within Functional Limits for tasks assessed    Extremity/Trunk Assessment Upper Extremity Assessment Upper Extremity Assessment: Defer to OT evaluation Lower Extremity Assessment Lower Extremity Assessment: LLE deficits/detail LLE Deficits / Details: 2/5 quads with AAROM at knee - 10 - 30 with ++ muscle guarding   Balance    End of Session PT - End of  Session Equipment Utilized During Treatment: Gait belt;Left knee immobilizer Activity Tolerance: Other (comment) (Ltd by c/o dizziness) Patient left: in chair;with call bell/phone within reach;with nursing/sitter in room Nurse Communication: Mobility status  GP     Alekzander Cardell 01/21/2014, 1:03 PM

## 2014-01-21 NOTE — Progress Notes (Signed)
   CARE MANAGEMENT NOTE 01/21/2014  Patient:  Jade [name], Jade [name]   Account Number:  [id number]  Date Initiated:  01/20/2014  Documentation initiated by:  [id number]  Subjective/Objective Assessment:   LEFT TOTAL KNEE ARTHROPLASTY     Action/Plan:   [place]  lives at home with husband   Anticipated DC Date:  01/22/2014   Anticipated DC Plan:  [place]  CM consult      [facility] Choice  HOME HEALTH   Choice offered to / List presented to:  C-1 Patient   DME arranged  3-N-1  Vassie Moselle      DME agency  [place] arranged  [place].   Status of service:  Completed, signed off Medicare Important Message given?   (If response is "NO", the following Medicare IM given date fields will be blank) Date Medicare IM given:   Date Additional Medicare IM given:    Discharge Disposition:  [place]  Per UR Regulation:    If discussed at Long Length of Stay Meetings, dates discussed:    Comments:  01/21/2014 1000 NCM spoke to pt and requested [facility]. Notified [facility] for [facility] PT and DME.  Jonnie Finner RN CCM Case Mgmt phone [phone]  01/20/2014 1815 NCM spoke to pt and offered choice for [facility]. States she would like to review list. Requesting 3n1, RW and tub bench for home. She would like out of pocket cost for tub bench. Jonnie Finner RN CCM Case Mgmt phone [phone]

## 2014-01-21 NOTE — Progress Notes (Addendum)
Subjective: 1 Day Post-Op Procedure(s) (LRB): LEFT TOTAL KNEE ARTHROPLASTY (Left) Patient reports pain as 4 on 0-10 scale.    Objective: Vital signs in last 24 hours: Temp:  [97.6 F (36.4 C)-99.3 F (37.4 C)] 99.1 F (37.3 C) (02/27 0645) Pulse Rate:  [79-105] 100 (02/27 0645) Resp:  [15-19] 16 (02/27 0645) BP: (125-154)/(73-97) 150/82 mmHg (02/27 0645) SpO2:  [95 %-100 %] 95 % (02/27 0645) Weight:  [101.152 kg (223 lb)] 101.152 kg (223 lb) (02/26 1130)  Intake/Output from previous day: 02/26 0701 - 02/27 0700 In: 5350 [P.O.:2640; I.V.:2560; IV Piggyback:150] Out: 5050 [Urine:4550; Drains:500] Intake/Output this shift:     Recent Labs  01/21/14 0425  HGB 12.3    Recent Labs  01/21/14 0425  WBC 14.7*  RBC 4.54  HCT 36.6  PLT 282    Recent Labs  01/21/14 0425  NA 136*  K 3.4*  CL 97  CO2 25  BUN 7  CREATININE 0.53  GLUCOSE 126*  CALCIUM 9.1   No results found for this basename: LABPT, INR,  in the last 72 hours  Neurologically intact Sensation intact distally Dorsiflexion/Plantar flexion intact Compartment soft Ace bunched at thigh. Removed. Drain removed. tip intact.  Assessment/Plan: 1 Day Post-Op Procedure(s) (LRB): LEFT TOTAL KNEE ARTHROPLASTY (Left) OOB. TED left leg. D/C CPM  Aspasia Rude C 01/21/2014, 7:40 AM Xray good. Lucency from IM guide

## 2014-01-22 LAB — CBC
HCT: 36.3 % (ref 36.0–46.0)
Hemoglobin: 12.1 g/dL (ref 12.0–15.0)
MCH: 27.1 pg (ref 26.0–34.0)
MCHC: 33.3 g/dL (ref 30.0–36.0)
MCV: 81.2 fL (ref 78.0–100.0)
Platelets: 266 10*3/uL (ref 150–400)
RBC: 4.47 MIL/uL (ref 3.87–5.11)
RDW: 13.4 % (ref 11.5–15.5)
WBC: 13.4 10*3/uL — ABNORMAL HIGH (ref 4.0–10.5)

## 2014-01-22 NOTE — Progress Notes (Signed)
Physical Therapy Treatment Patient Details Name: Jade [name] MRN: [id number] DOB: Apr 06, 1956 Today's Date: 01/22/2014 Time: 6060-0459 PT Time Calculation (min): 26 min  PT Assessment / Plan / Recommendation  History of Present Illness Pt is s/p L TKA   PT Comments   Progressing steadily but with limited endurance and significant muscle guarding with attempts to flex knee  Follow Up Recommendations  Home health PT     Does the patient have the potential to tolerate intense rehabilitation     Barriers to Discharge        Equipment Recommendations  Rolling walker with 5" wheels    Recommendations for Other Services OT consult  Frequency 7X/week   Progress towards PT Goals Progress towards PT goals: Progressing toward goals  Plan Current plan remains appropriate    Precautions / Restrictions Precautions Precautions: Fall;Knee Required Braces or Orthoses: Knee Immobilizer - Left Knee Immobilizer - Left: Discontinue once straight leg raise with < 10 degree lag Restrictions Weight Bearing Restrictions: No Other Position/Activity Restrictions: WBAT   Pertinent Vitals/Pain 6/10; premed, ice packs provided.    Mobility  Bed Mobility Overal bed mobility: Needs Assistance Bed Mobility: Supine to Sit;Sit to Supine Supine to sit: Min assist Sit to supine: Min assist General bed mobility comments: for L LE over to EOB Transfers Overall transfer level: Needs assistance Equipment used: Rolling walker (2 wheeled) Transfers: Sit to/from Stand Sit to Stand: Min guard General transfer comment: verbal cues for hand placement. Ambulation/Gait Ambulation/Gait assistance: Min assist Ambulation Distance (Feet): 68 Feet Assistive device: Rolling walker (2 wheeled) Gait Pattern/deviations: Step-to pattern;Decreased step length - right;Decreased step length - left;Shuffle;Antalgic;Trunk flexed Gait velocity: decr General Gait Details: cues for sequence, posture, position from RW;  multiple standing rests required for task completion    Exercises     PT Diagnosis:    PT Problem List:   PT Treatment Interventions:     PT Goals (current goals can now be found in the care plan section) Acute Rehab PT Goals Patient Stated Goal: Resume previous lifestyle with decreased pain PT Goal Formulation: With patient Time For Goal Achievement: 01/27/14 Potential to Achieve Goals: Good  Visit Information  Last PT Received On: 01/22/14 Assistance Needed: +1 History of Present Illness: Pt is s/p L TKA    Subjective Data  Subjective: C/o fatigue with ambulation "I think its because I'm a smoker" Patient Stated Goal: Resume previous lifestyle with decreased pain   Cognition  Cognition Arousal/Alertness: Awake/alert Behavior During Therapy: WFL for tasks assessed/performed Overall Cognitive Status: Within Functional Limits for tasks assessed    Balance     End of Session PT - End of Session Equipment Utilized During Treatment: Gait belt;Left knee immobilizer Activity Tolerance: Patient tolerated treatment well;Patient limited by fatigue Patient left: in bed;with call bell/phone within reach Nurse Communication: Mobility status;Patient requests pain meds   GP     Jade [name] 01/22/2014, 4:19 PM

## 2014-01-22 NOTE — Progress Notes (Signed)
Subjective: Doing well.  Pain controlled.  No complaints.    Objective: Vital signs in last 24 hours: Temp:  [98.2 F (36.8 C)-98.7 F (37.1 C)] 98.4 F (36.9 C) (02/28 0645) Pulse Rate:  [93-105] 97 (02/28 0645) Resp:  [16-17] 16 (02/28 0645) BP: (117-178)/(79-88) 117/80 mmHg (02/28 0645) SpO2:  [90 %-99 %] 90 % (02/28 0645)  Intake/Output from previous day: 02/27 0701 - 02/28 0700 In: 1300.8 [P.O.:720; I.V.:580.8] Out: 2050 [Urine:2050] Intake/Output this shift: Total I/O In: -  Out: 300 [Urine:300]   Recent Labs  01/21/14 0425 01/22/14 0441  HGB 12.3 12.1    Recent Labs  01/21/14 0425 01/22/14 0441  WBC 14.7* 13.4*  RBC 4.54 4.47  HCT 36.6 36.3  PLT 282 266    Recent Labs  01/21/14 0425  NA 136*  K 3.4*  CL 97  CO2 25  BUN 7  CREATININE 0.53  GLUCOSE 126*  CALCIUM 9.1   No results found for this basename: LABPT, INR,  in the last 72 hours  Exam:  Dressing intact.  Calf nt, nvi.    Assessment/Plan: Anticipate d/c home tomorrow.  Continue PT.     Benjiman Core M 01/22/2014, 9:21 AM

## 2014-01-22 NOTE — Evaluation (Signed)
Occupational Therapy Evaluation Patient Details Name: Jade [name] MRN: [id number] DOB: September 01, 1956 Today's Date: 01/22/2014 Time: 3235-5732 OT Time Calculation (min): 23 min  OT Assessment / Plan / Recommendation History of present illness Pt is s/p L TKA   Clinical Impression   Pt is motivated and doing well. Education provided to pt and spouse on AE and DME. Practiced functional transfers and pt with no complaint of dizziness. Will benefit from continued OT services to maximize ADL independence for d/c home with spouse.    OT Assessment  Patient needs continued OT Services    Follow Up Recommendations  No OT follow up;Supervision/Assistance - 24 hour    Barriers to Discharge      Equipment Recommendations  3 in 1 bedside comode;Tub/shower bench (3in1 and walker already in room. Pt now wants a tub transfer bench)    Recommendations for Other Services    Frequency  Min 2X/week    Precautions / Restrictions Precautions Precautions: Fall;Knee Required Braces or Orthoses: Knee Immobilizer - Left Knee Immobilizer - Left: Discontinue once straight leg raise with < 10 degree lag Restrictions Weight Bearing Restrictions: No Other Position/Activity Restrictions: WBAT   Pertinent Vitals/Pain 6/10 with activity Reposition, ice    ADL  Eating/Feeding: Independent Where Assessed - Eating/Feeding: Chair Grooming: Wash/dry hands;Set up Where Assessed - Grooming: Unsupported sitting Upper Body Bathing: Chest;Right arm;Left arm;Abdomen;Set up Where Assessed - Upper Body Bathing: Unsupported sitting Lower Body Bathing: Moderate assistance Where Assessed - Lower Body Bathing: Supported sit to stand Upper Body Dressing: Set up Where Assessed - Upper Body Dressing: Unsupported sitting Lower Body Dressing: Moderate assistance Where Assessed - Lower Body Dressing: Supported sit to stand Toilet Transfer: Minimal assistance Toilet Transfer Method: Stand pivot Toileting - Clothing  Manipulation and Hygiene: Minimal assistance Where Assessed - Toileting Clothing Manipulation and Hygiene: Sit to stand from 3-in-1 or toilet Equipment Used: Knee Immobilizer;Rolling walker ADL Comments: Pt and husband educated on AE options and ultimate goal to work toward reaching to L foot to increase flexibility of L knee. Pt's husband states he may go ahead and purchase AE for her to have in interim. Pt would now like to have a tubbench ordered and verbally reviewed technique but pt would benefit from practice.     OT Diagnosis: Generalized weakness  OT Problem List: Decreased strength;Decreased knowledge of use of DME or AE OT Treatment Interventions: Self-care/ADL training;DME and/or AE instruction;Therapeutic activities;Patient/family education   OT Goals(Current goals can be found in the care plan section) Acute Rehab OT Goals Patient Stated Goal: Resume previous lifestyle with decreased pain OT Goal Formulation: With patient/family Time For Goal Achievement: 01/29/14 Potential to Achieve Goals: Good  Visit Information  Last OT Received On: 01/22/14 Assistance Needed: +1 History of Present Illness: Pt is s/p L TKA       Prior Functioning     Home Living Family/patient expects to be discharged to:: Private residence Living Arrangements: Spouse/significant other Available Help at Discharge: Family Type of Home: House Home Access: Stairs to enter Technical brewer of Steps: 2 Entrance Stairs-Rails: None Home Layout: One level Home Equipment: None Prior Function Level of Independence: Independent Communication Communication: No difficulties         Vision/Perception     Cognition  Cognition Arousal/Alertness: Awake/alert Behavior During Therapy: WFL for tasks assessed/performed Overall Cognitive Status: Within Functional Limits for tasks assessed    Extremity/Trunk Assessment Upper Extremity Assessment Upper Extremity Assessment: Overall WFL for tasks  assessed     Mobility  Bed Mobility Bed Mobility: Supine to Sit Supine to sit: Min assist General bed mobility comments: for L LE over to EOB Transfers Overall transfer level: Needs assistance Equipment used: Rolling walker (2 wheeled) Transfers: Sit to/from Stand Sit to Stand: Min assist General transfer comment: verbal cues for hand placement.     Exercise     Balance     End of Session OT - End of Session Equipment Utilized During Treatment: Rolling walker;Left knee immobilizer Activity Tolerance: Patient tolerated treatment well Patient left: in chair;with family/visitor present;with call bell/phone within reach  [place], [place] [phone] 01/22/2014, 10:58 AM

## 2014-01-22 NOTE — Plan of Care (Signed)
Problem: Phase III Progression Outcomes Goal: Anticoagulant follow-up in place Outcome: Not Applicable Date Met:  17/92/17 xarelto

## 2014-01-22 NOTE — Progress Notes (Signed)
Physical Therapy Treatment Patient Details Name: Jade [name] MRN: [id number] DOB: 1956-02-17 Today's Date: 01/22/2014 Time: 3220-2542 PT Time Calculation (min): 35 min  PT Assessment / Plan / Recommendation  History of Present Illness Pt is s/p L TKA   PT Comments     Follow Up Recommendations  Home health PT     Does the patient have the potential to tolerate intense rehabilitation     Barriers to Discharge        Equipment Recommendations  Rolling walker with 5" wheels    Recommendations for Other Services OT consult  Frequency 7X/week   Progress towards PT Goals Progress towards PT goals: Progressing toward goals  Plan Current plan remains appropriate    Precautions / Restrictions Precautions Precautions: Fall;Knee Required Braces or Orthoses: Knee Immobilizer - Left Knee Immobilizer - Left: Discontinue once straight leg raise with < 10 degree lag Restrictions Weight Bearing Restrictions: No Other Position/Activity Restrictions: WBAT   Pertinent Vitals/Pain 6/10; premed, ice packs provided    Mobility  Bed Mobility Bed Mobility: Supine to Sit Supine to sit: Min assist General bed mobility comments: for L LE over to EOB Transfers Overall transfer level: Needs assistance Equipment used: Rolling walker (2 wheeled) Transfers: Sit to/from Stand Sit to Stand: Min assist General transfer comment: verbal cues for hand placement. Ambulation/Gait Ambulation/Gait assistance: Min assist Ambulation Distance (Feet): 56 Feet Assistive device: Rolling walker (2 wheeled) Gait Pattern/deviations: Step-to pattern;Shuffle;Decreased step length - right;Decreased step length - left;Antalgic Gait velocity: decr General Gait Details: cues for sequence, posture, position from RW    Exercises Total Joint Exercises Ankle Circles/Pumps: AROM;Both;15 reps;Supine Quad Sets: AROM;Both;Supine;15 reps Heel Slides: AAROM;Left;Supine;15 reps Straight Leg Raises: AAROM;Left;Supine;15  reps Goniometric ROM: AAROM at knee -10 - 35 with ++muscle guarding   PT Diagnosis:    PT Problem List:   PT Treatment Interventions:     PT Goals (current goals can now be found in the care plan section) Acute Rehab PT Goals Patient Stated Goal: Resume previous lifestyle with decreased pain PT Goal Formulation: With patient Time For Goal Achievement: 01/27/14 Potential to Achieve Goals: Good  Visit Information  Last PT Received On: 01/22/14 Assistance Needed: +1 History of Present Illness: Pt is s/p L TKA    Subjective Data  Subjective: C/o fatigue with ambulation Patient Stated Goal: Resume previous lifestyle with decreased pain   Cognition  Cognition Arousal/Alertness: Awake/alert Behavior During Therapy: WFL for tasks assessed/performed Overall Cognitive Status: Within Functional Limits for tasks assessed    Balance     End of Session PT - End of Session Equipment Utilized During Treatment: Gait belt;Left knee immobilizer Activity Tolerance: Patient tolerated treatment well;Patient limited by fatigue Patient left: in chair;with call bell/phone within reach;with family/visitor present Nurse Communication: Mobility status   GP     Rosalio Catterton 01/22/2014, 1:28 PM

## 2014-01-23 LAB — CBC
HCT: 35.6 % — ABNORMAL LOW (ref 36.0–46.0)
Hemoglobin: 11.9 g/dL — ABNORMAL LOW (ref 12.0–15.0)
MCH: 27.2 pg (ref 26.0–34.0)
MCHC: 33.4 g/dL (ref 30.0–36.0)
MCV: 81.3 fL (ref 78.0–100.0)
Platelets: 255 10*3/uL (ref 150–400)
RBC: 4.38 MIL/uL (ref 3.87–5.11)
RDW: 13.4 % (ref 11.5–15.5)
WBC: 12.4 10*3/uL — ABNORMAL HIGH (ref 4.0–10.5)

## 2014-01-23 NOTE — Progress Notes (Signed)
Physical Therapy Treatment Patient Details Name: Jade [name] MRN: [id number] DOB: January 05, 1956 Today's Date: 01/23/2014 Time: 4098-1191 PT Time Calculation (min): 24 min  PT Assessment / Plan / Recommendation  History of Present Illness Pt is s/p L TKA   PT Comments   Pt doing well this am, wants to go home; needs to work on knee flexion  Follow Up Recommendations  Home health PT     Does the patient have the potential to tolerate intense rehabilitation     Barriers to Discharge        Equipment Recommendations  Rolling walker with 5" wheels    Recommendations for Other Services    Frequency 7X/week   Progress towards PT Goals Progress towards PT goals: Progressing toward goals  Plan Current plan remains appropriate    Precautions / Restrictions Precautions Precautions: Fall;Knee Required Braces or Orthoses: Knee Immobilizer - Left Knee Immobilizer - Left: Discontinue once straight leg raise with < 10 degree lag Restrictions Weight Bearing Restrictions: No Other Position/Activity Restrictions: WBAT   Pertinent Vitals/Pain     Mobility  Bed Mobility Bed Mobility: Sit to Supine Sit to supine: Min assist General bed mobility comments: with LLE Transfers Overall transfer level: Needs assistance Equipment used: Rolling walker (2 wheeled) Transfers: Sit to/from Stand Sit to Stand: Supervision General transfer comment: subtle cuesf or hand placement Ambulation/Gait Ambulation/Gait assistance: Supervision Ambulation Distance (Feet): 100 Feet Assistive device: Rolling walker (2 wheeled) Gait Pattern/deviations: Step-to pattern;Step-through pattern Gait velocity: decr General Gait Details: cues for sequence, posture, position from RW; multiple standing rests required for task completion Stairs: Yes Stairs assistance: Min assist Stair Management: Backwards;With walker;Step to pattern Number of Stairs: 4 General stair comments: cues for sequence, husband able to  assist    Exercises Total Joint Exercises Ankle Circles/Pumps: AROM;Both;15 reps;Supine Quad Sets: AROM;Both;10 reps Heel Slides: AAROM;Left;10 reps Straight Leg Raises: AAROM;Left;10 reps   PT Diagnosis:    PT Problem List:   PT Treatment Interventions:     PT Goals (current goals can now be found in the care plan section) Acute Rehab PT Goals Patient Stated Goal: Resume previous lifestyle with decreased pain Time For Goal Achievement: 01/27/14 Potential to Achieve Goals: Good  Visit Information  Last PT Received On: 01/23/14 Assistance Needed: +1 History of Present Illness: Pt is s/p L TKA    Subjective Data  Subjective: mild nausea, RN Patient Stated Goal: Resume previous lifestyle with decreased pain   Cognition  Cognition Arousal/Alertness: Awake/alert Behavior During Therapy: WFL for tasks assessed/performed Overall Cognitive Status: Within Functional Limits for tasks assessed    Balance     End of Session PT - End of Session Equipment Utilized During Treatment: Gait belt;Left knee immobilizer Activity Tolerance: Patient tolerated treatment well;Patient limited by fatigue Patient left: in bed;with call bell/phone within reach;with family/visitor present Nurse Communication: Mobility status;Patient requests pain meds CPM Left Knee CPM Left Knee: Off   GP     [facility] 01/23/2014, 10:14 AM

## 2014-01-23 NOTE — Plan of Care (Signed)
Problem: Discharge Progression Outcomes Goal: Anticoagulant follow-up in place Outcome: Not Applicable Date Met:  49/75/30 xarelto

## 2014-01-23 NOTE — Progress Notes (Signed)
Subjective: 3 Days Post-Op Procedure(s) (LRB): LEFT TOTAL KNEE ARTHROPLASTY (Left) Patient reports pain as 3 on 0-10 scale.    Objective: Vital signs in last 24 hours: Temp:  [98.2 F (36.8 C)-99.5 F (37.5 C)] 99.2 F (37.3 C) (03/01 0455) Pulse Rate:  [98-107] 98 (03/01 0455) Resp:  [16-20] 20 (03/01 0455) BP: (130-134)/(79-86) 130/83 mmHg (03/01 0455) SpO2:  [90 %-95 %] 95 % (03/01 0455)  Intake/Output from previous day: 02/28 0701 - 03/01 0700 In: 1080 [P.O.:1080] Out: 850 [Urine:850] Intake/Output this shift:     Recent Labs  01/21/14 0425 01/22/14 0441 01/23/14 0432  HGB 12.3 12.1 11.9*    Recent Labs  01/22/14 0441 01/23/14 0432  WBC 13.4* 12.4*  RBC 4.47 4.38  HCT 36.3 35.6*  PLT 266 255    Recent Labs  01/21/14 0425  NA 136*  K 3.4*  CL 97  CO2 25  BUN 7  CREATININE 0.53  GLUCOSE 126*  CALCIUM 9.1   No results found for this basename: LABPT, INR,  in the last 72 hours  Neurologically intact Neurovascular intact Dorsiflexion/Plantar flexion intact Incision: dressing C/D/I Compartment soft  Assessment/Plan: 3 Days Post-Op Procedure(s) (LRB): LEFT TOTAL KNEE ARTHROPLASTY (Left) Discharge home with home health Instr given  Jade [name] C 01/23/2014, 8:06 AM

## 2014-01-23 NOTE — Progress Notes (Signed)
Occupational Therapy Treatment Patient Details Name: MEL TADROS MRN: [id number] DOB: [birth year] Today's Date: 01/23/2014 Time: 7048-8891 OT Time Calculation (min): 30 min  OT Assessment / Plan / Recommendation  History of present illness 58 yo female s/p LT TKA with KI   OT comments  Pt has met 3 out 4 goals and is at adequate level for d/c home. Pt has correct sequence for tub transfer. Pt not meeting goal due to inability to lift LT LE at this time and this will progress quickly upon d/c. All education is complete and patient indicates understanding.    Follow Up Recommendations  No OT follow up    Barriers to Discharge       Equipment Recommendations  Tub/shower bench (has 3n1 at home already)    Recommendations for Other Services    Frequency Min 2X/week   Progress towards OT Goals Progress towards OT goals: Progressing toward goals  Plan Discharge plan remains appropriate    Precautions / Restrictions Precautions Precautions: Fall;Knee Required Braces or Orthoses: Knee Immobilizer - Left Knee Immobilizer - Left: Discontinue once straight leg raise with < 10 degree lag Restrictions Weight Bearing Restrictions: No Other Position/Activity Restrictions: WBAT   Pertinent Vitals/Pain Minimal pain reported premedicated Ice applied    ADL  Eating/Feeding: Independent Where Assessed - Eating/Feeding: Bed level Grooming: Wash/dry hands;Wash/dry face;Supervision/safety Where Assessed - Grooming: Unsupported standing Toilet Transfer: Copy Method: Sit to Loss adjuster, chartered: Bedside commode (over toilet) Toileting - Clothing Manipulation and Hygiene: Supervision/safety Where Assessed - Toileting Clothing Manipulation and Hygiene: Sit to stand from 3-in-1 or toilet Tub/Shower Transfer: Minimal assistance Tub/Shower Transfer Method: Therapist, art: IT consultant Used: Rolling walker;Knee  Immobilizer Transfers/Ambulation Related to ADLs: Pt ambulated with RW supervision with husband (A) ADL Comments: Husband purchased Ae yesterday. Pt and spouse don KI and educated on fit/ positioning/ correct strap positioning. pt progressed to EOB with spouse (A). pt completed simulated tub transfer with trash can and tub bench.Pt required (A) for LT LE due to inability to lift leg. Pt educated on use of sheet as leg lifter due to short time use. Pt transferring to bathroom for void of bladder and peri hygiene performed for LB bathing. pt positioned in chair at end of session with ice and LT LE elevated. Pt educated on edema management for home dc.     OT Diagnosis:        OT Goals(current goals can now be found in the care plan section) Acute Rehab OT Goals Patient Stated Goal: Resume previous lifestyle with decreased pain OT Goal Formulation: With patient/family Time For Goal Achievement: 01/29/14 Potential to Achieve Goals: Good ADL Goals Pt Will Transfer to Toilet: with min guard assist;ambulating;bedside commode Pt Will Perform Toileting - Clothing Manipulation and hygiene: with min guard assist;sit to/from stand Pt Will Perform Tub/Shower Transfer: Tub transfer;with min assist;tub bench Additional ADL Goal #1: pt/spouse will verbalize /demo AE with independence.  Visit Information  Last OT Received On: 01/23/14 Assistance Needed: +1 History of Present Illness: 58 yo female s/p LT TKA with KI    Subjective Data      Prior Functioning       Cognition  Cognition Arousal/Alertness: Awake/alert Behavior During Therapy: WFL for tasks assessed/performed Overall Cognitive Status: Within Functional Limits for tasks assessed    Mobility  Bed Mobility Overal bed mobility: Needs Assistance Bed Mobility: Supine to Sit Supine to sit: Supervision Sit to supine: Min assist General bed mobility  comments: able to progress LT LE sliding it Transfers Overall transfer level: Needs  assistance Equipment used: Rolling walker (2 wheeled) Transfers: Sit to/from Stand Sit to Stand: Supervision General transfer comment: cues for hand placement       Balance    End of Session OT - End of Session Activity Tolerance: Patient tolerated treatment well Patient left: in chair;with call bell/phone within reach;with family/visitor present Nurse Communication: Mobility status;Precautions CPM Left Knee CPM Left Knee: Off  GO     Peri Maris 01/23/2014, 11:21 AM Pager: [phone]

## 2014-01-25 ENCOUNTER — Telehealth (HOSPITAL_COMMUNITY): Payer: Self-pay

## 2014-01-25 ENCOUNTER — Encounter (HOSPITAL_COMMUNITY): Payer: Self-pay

## 2014-01-25 NOTE — Discharge Summary (Signed)
Physician Discharge Summary   Patient ID: Jade [name] MRN: [id number] DOB/AGE: 1955/12/05 58 y.o.  Admit date: 01/20/2014 Discharge date: 01/23/2014  Primary Diagnosis: left knee DJD  Admission Diagnoses:  Past Medical History  Diagnosis Date  . GERD (gastroesophageal reflux disease)   . Benign microscopic hematuria     neg cystoscopy 11/12- dr. Janice Norrie  . Hypertension   . Hyperlipidemia   . Shortness of breath     with  exertion  . Numbness and tingling in left arm   . DJD (degenerative joint disease)     L KNEE  . Family history of anesthesia complication     multiple family members with history of this   Discharge Diagnoses:   Principal Problem:   Left knee DJD  Estimated body mass index is 36.01 kg/(m^2) as calculated from the following:   Height as of this encounter: 5' 6" (1.676 m).   Weight as of this encounter: 101.152 kg (223 lb).  Procedure:  Procedure(s) (LRB): LEFT TOTAL KNEE ARTHROPLASTY (Left)   Consults: None  HPI: see H&P Laboratory Data: Admission on 01/20/2014, Discharged on 01/23/2014  Component Date Value Ref Range Status  . WBC 01/21/2014 14.7* 4.0 - 10.5 K/uL Final  . RBC 01/21/2014 4.54  3.87 - 5.11 MIL/uL Final  . Hemoglobin 01/21/2014 12.3  12.0 - 15.0 g/dL Final  . HCT 01/21/2014 36.6  36.0 - 46.0 % Final  . MCV 01/21/2014 80.6  78.0 - 100.0 fL Final  . MCH 01/21/2014 27.1  26.0 - 34.0 pg Final  . MCHC 01/21/2014 33.6  30.0 - 36.0 g/dL Final  . RDW 01/21/2014 13.7  11.5 - 15.5 % Final  . Platelets 01/21/2014 282  150 - 400 K/uL Final  . Sodium 01/21/2014 136* 137 - 147 mEq/L Final  . Potassium 01/21/2014 3.4* 3.7 - 5.3 mEq/L Final  . Chloride 01/21/2014 97  96 - 112 mEq/L Final  . CO2 01/21/2014 25  19 - 32 mEq/L Final  . Glucose, Bld 01/21/2014 126* 70 - 99 mg/dL Final  . BUN 01/21/2014 7  6 - 23 mg/dL Final  . Creatinine, Ser 01/21/2014 0.53  0.50 - 1.10 mg/dL Final  . Calcium 01/21/2014 9.1  8.4 - 10.5 mg/dL Final  . GFR calc non  Af Amer 01/21/2014 >90  >90 mL/min Final  . GFR calc Af Amer 01/21/2014 >90  >90 mL/min Final   Comment: (NOTE)                          The eGFR has been calculated using the CKD EPI equation.                          This calculation has not been validated in all clinical situations.                          eGFR's persistently <90 mL/min signify possible Chronic Kidney                          Disease.  . WBC 01/22/2014 13.4* 4.0 - 10.5 K/uL Final  . RBC 01/22/2014 4.47  3.87 - 5.11 MIL/uL Final  . Hemoglobin 01/22/2014 12.1  12.0 - 15.0 g/dL Final  . HCT 01/22/2014 36.3  36.0 - 46.0 % Final  . MCV 01/22/2014 81.2  78.0 - 100.0 fL Final  .  MCH 01/22/2014 27.1  26.0 - 34.0 pg Final  . MCHC 01/22/2014 33.3  30.0 - 36.0 g/dL Final  . RDW 01/22/2014 13.4  11.5 - 15.5 % Final  . Platelets 01/22/2014 266  150 - 400 K/uL Final  . WBC 01/23/2014 12.4* 4.0 - 10.5 K/uL Final  . RBC 01/23/2014 4.38  3.87 - 5.11 MIL/uL Final  . Hemoglobin 01/23/2014 11.9* 12.0 - 15.0 g/dL Final  . HCT 01/23/2014 35.6* 36.0 - 46.0 % Final  . MCV 01/23/2014 81.3  78.0 - 100.0 fL Final  . MCH 01/23/2014 27.2  26.0 - 34.0 pg Final  . MCHC 01/23/2014 33.4  30.0 - 36.0 g/dL Final  . RDW 01/23/2014 13.4  11.5 - 15.5 % Final  . Platelets 01/23/2014 255  150 - 400 K/uL Final  Hospital Outpatient Visit on 01/13/2014  Component Date Value Ref Range Status  . aPTT 01/13/2014 29  24 - 37 seconds Final  . Sodium 01/13/2014 143  137 - 147 mEq/L Final  . Potassium 01/13/2014 3.4* 3.7 - 5.3 mEq/L Final  . Chloride 01/13/2014 105  96 - 112 mEq/L Final  . CO2 01/13/2014 26  19 - 32 mEq/L Final  . Glucose, Bld 01/13/2014 122* 70 - 99 mg/dL Final  . BUN 01/13/2014 16  6 - 23 mg/dL Final  . Creatinine, Ser 01/13/2014 0.74  0.50 - 1.10 mg/dL Final  . Calcium 01/13/2014 9.3  8.4 - 10.5 mg/dL Final  . GFR calc non Af Amer 01/13/2014 >90  >90 mL/min Final  . GFR calc Af Amer 01/13/2014 >90  >90 mL/min Final   Comment: (NOTE)                           The eGFR has been calculated using the CKD EPI equation.                          This calculation has not been validated in all clinical situations.                          eGFR's persistently <90 mL/min signify possible Chronic Kidney                          Disease.  . WBC 01/13/2014 7.9  4.0 - 10.5 K/uL Final  . RBC 01/13/2014 4.98  3.87 - 5.11 MIL/uL Final  . Hemoglobin 01/13/2014 13.7  12.0 - 15.0 g/dL Final  . HCT 01/13/2014 40.5  36.0 - 46.0 % Final  . MCV 01/13/2014 81.3  78.0 - 100.0 fL Final  . MCH 01/13/2014 27.5  26.0 - 34.0 pg Final  . MCHC 01/13/2014 33.8  30.0 - 36.0 g/dL Final  . RDW 01/13/2014 13.7  11.5 - 15.5 % Final  . Platelets 01/13/2014 286  150 - 400 K/uL Final  . Prothrombin Time 01/13/2014 12.8  11.6 - 15.2 seconds Final  . INR 01/13/2014 0.98  0.00 - 1.49 Final  . ABO/RH(D) 01/13/2014 O POS   Final  . Antibody Screen 01/13/2014 NEG   Final  . Sample Expiration 01/13/2014 01/23/2014   Final  . Color, Urine 01/13/2014 AMBER* YELLOW Final   BIOCHEMICALS MAY BE AFFECTED BY COLOR  . APPearance 01/13/2014 CLOUDY* CLEAR Final  . Specific Gravity, Urine 01/13/2014 1.039* 1.005 - 1.030 Final  . pH 01/13/2014 5.5  5.0 - 8.0  Final  . Glucose, UA 01/13/2014 NEGATIVE  NEGATIVE mg/dL Final  . Hgb urine dipstick 01/13/2014 NEGATIVE  NEGATIVE Final  . Bilirubin Urine 01/13/2014 SMALL* NEGATIVE Final  . Ketones, ur 01/13/2014 NEGATIVE  NEGATIVE mg/dL Final  . Protein, ur 01/13/2014 NEGATIVE  NEGATIVE mg/dL Final  . Urobilinogen, UA 01/13/2014 1.0  0.0 - 1.0 mg/dL Final  . Nitrite 01/13/2014 NEGATIVE  NEGATIVE Final  . Leukocytes, UA 01/13/2014 NEGATIVE  NEGATIVE Final   Comment: MICROSCOPIC NOT DONE ON URINES WITH NEGATIVE PROTEIN, BLOOD, LEUKOCYTES, NITRITE, OR GLUCOSE <1000 mg/dL.                          LESS THAN 10 mL OF URINE SUBMITTED  . ABO/RH(D) 01/13/2014 O POS   Final     X-Rays:Dg Knee 1-2 Views Left  01/13/2014   CLINICAL DATA:   Preop for left knee replacement  EXAM: LEFT KNEE - 1-2 VIEW  COMPARISON:  None.  FINDINGS: Two views of left knee submitted. Osteoarthritic changes are noted with narrowing of medial joint compartment. There is mild spurring of medial and lateral femoral condyle. Mild spurring of medial tibial plateau. Narrowing of patellofemoral joint space. No acute fracture or subluxation. No joint effusion. Spurring of patella.  IMPRESSION: No acute fracture or subluxation. Osteoarthritic changes as described above.   Electronically Signed   By: Lahoma Crocker M.D.   On: 01/13/2014 13:05   Dg Knee Left Port  01/20/2014   CLINICAL DATA:  Postop left knee replacement.  EXAM: PORTABLE LEFT KNEE - 1-2 VIEW  COMPARISON:  01/13/2014.  FINDINGS: Post total left knee replacement which appears in satisfactory position without complication noted.  Prominent component of cement within the central aspect of the distal left femoral condyle with proximal lucency which may reflect changes of operative procedure. Clinical/operative correlation recommended.  Surgical drain is in place.  IMPRESSION: Post total left knee replacement as discussed above.   Electronically Signed   By: Chauncey Cruel M.D.   On: 01/20/2014 10:25    EKG: Orders placed in visit on 10/06/13  . EKG 12-LEAD     Hospital Course: MALEIGHA COLVARD is a 58 y.o. who was admitted to [facility]. They were brought to the operating room on 01/20/2014 and underwent Procedure(s): LEFT TOTAL KNEE ARTHROPLASTY.  Patient tolerated the procedure well and was later transferred to the recovery room and then to the orthopaedic floor for postoperative care.  They were given PO and IV analgesics for pain control following their surgery.  They were given 24 hours of postoperative antibiotics of  Anti-infectives   Start     Dose/Rate Route Frequency Ordered Stop   01/20/14 1600  clindamycin (CLEOCIN) IVPB 900 mg     900 mg 100 mL/hr over 30 Minutes Intravenous 3 times per day  01/20/14 1142 01/20/14 1716   01/20/14 1400  ceFAZolin (ANCEF) IVPB 2 g/50 mL premix     2 g 100 mL/hr over 30 Minutes Intravenous Every 6 hours 01/20/14 1142 01/21/14 0200   01/20/14 0829  polymyxin B 500,000 Units, bacitracin 50,000 Units in sodium chloride irrigation 0.9 % 500 mL irrigation  Status:  Discontinued       As needed 01/20/14 0829 01/20/14 0955   01/20/14 0600  clindamycin (CLEOCIN) IVPB 900 mg     900 mg 100 mL/hr over 30 Minutes Intravenous On call to O.R. 01/20/14 [id number] 01/20/14 0723   01/20/14 0600  ceFAZolin (ANCEF) IVPB  2 g/50 mL premix     2 g 100 mL/hr over 30 Minutes Intravenous On call to O.R. 01/20/14 [id number] 01/20/14 0743     and started on DVT prophylaxis in the form of Xarelto, TED hose and SCD's.   PT and OT were ordered for total joint protocol.  Discharge planning consulted to help with postop disposition and equipment needs.  Patient had a good night on the evening of surgery.  They started to get up OOB with therapy on day one. Hemovac drain was pulled without difficulty.  Continued to work with therapy into day two. By day three, the patient had progressed with therapy and meeting their goals.  Incision was healing well.  Patient was seen in rounds and was ready to go home.   Discharge Medications: Prior to Admission medications   Medication Sig Start Date End Date Taking? Authorizing Provider  amLODipine (NORVASC) 10 MG tablet Take 10 mg by mouth every morning.   Yes Historical Provider, MD  gabapentin (NEURONTIN) 300 MG capsule Take 300-600 mg by mouth 3 (three) times daily. Takes 1 in the morning 1 at noon and 2 at bedtime   Yes Historical Provider, MD  methocarbamol (ROBAXIN) 500 MG tablet Take 500 mg by mouth every 8 (eight) hours as needed for muscle spasms.   Yes Historical Provider, MD  omeprazole (PRILOSEC) 40 MG capsule Take 40 mg by mouth daily.   Yes Historical Provider, MD  docusate sodium (COLACE) 100 MG capsule Take 1 capsule (100 mg total) by  mouth 2 (two) times daily as needed for mild constipation. 01/20/14   Johnn Hai, MD  methocarbamol (ROBAXIN) 500 MG tablet Take 1 tablet (500 mg total) by mouth 3 (three) times daily between meals as needed for muscle spasms. 01/20/14   Johnn Hai, MD  oxyCODONE-acetaminophen (PERCOCET) 5-325 MG per tablet Take 1-2 tablets by mouth every 4 (four) hours as needed. 01/20/14   Johnn Hai, MD  rivaroxaban (XARELTO) 10 MG TABS tablet Take 1 tablet (10 mg total) by mouth daily. 01/20/14   Johnn Hai, MD    Diet: Regular diet Activity:WBAT Follow-up:in 10-14 days Disposition - Home with HHPT Discharged Condition: good   Discharge Orders   Future Appointments Provider Department Dept Phone   03/04/2014 10:30 AM Debbrah Alar, NP [place] at  [facility] [phone]   Future Orders Complete By Expires   Call MD / Call 911  As directed    Comments:     If you experience chest pain or shortness of breath, CALL 911 and be transported to the hospital emergency room.  If you develope a fever above 101 F, pus (white drainage) or increased drainage or redness at the wound, or calf pain, call your surgeon's office.   Constipation Prevention  As directed    Comments:     Drink plenty of fluids.  Prune juice may be helpful.  You may use a stool softener, such as Colace (over the counter) 100 mg twice a day.  Use MiraLax (over the counter) for constipation as needed.   Diet - low sodium heart healthy  As directed    Increase activity slowly as tolerated  As directed        Medication List    STOP taking these medications       HYDROcodone-acetaminophen 5-325 MG per tablet  Commonly known as:  NORCO/VICODIN     meloxicam 7.5 MG tablet  Commonly known as:  MOBIC  naproxen sodium 220 MG tablet  Commonly known as:  ANAPROX      TAKE these medications       amLODipine 10 MG tablet  Commonly known as:  NORVASC  Take 10 mg by mouth every morning.     docusate  sodium 100 MG capsule  Commonly known as:  COLACE  Take 1 capsule (100 mg total) by mouth 2 (two) times daily as needed for mild constipation.     gabapentin 300 MG capsule  Commonly known as:  NEURONTIN  Take 300-600 mg by mouth 3 (three) times daily. Takes 1 in the morning 1 at noon and 2 at bedtime     methocarbamol 500 MG tablet  Commonly known as:  ROBAXIN  Take 500 mg by mouth every 8 (eight) hours as needed for muscle spasms.     methocarbamol 500 MG tablet  Commonly known as:  ROBAXIN  Take 1 tablet (500 mg total) by mouth 3 (three) times daily between meals as needed for muscle spasms.     omeprazole 40 MG capsule  Commonly known as:  PRILOSEC  Take 40 mg by mouth daily.     oxyCODONE-acetaminophen 5-325 MG per tablet  Commonly known as:  PERCOCET  Take 1-2 tablets by mouth every 4 (four) hours as needed.     rivaroxaban 10 MG Tabs tablet  Commonly known as:  XARELTO  Take 1 tablet (10 mg total) by mouth daily.           Follow-up Information   Follow up with Johnn Hai, MD.   Specialty:  Orthopedic Surgery   Contact information:   [address] [place] [address] [phone]       Follow up with [place])    Contact information:   [place] [id number] [phone]       Signed: Cecilie Kicks. 01/25/2014, 1:37 PM

## 2014-03-04 ENCOUNTER — Ambulatory Visit (INDEPENDENT_AMBULATORY_CARE_PROVIDER_SITE_OTHER): Payer: BC Managed Care – PPO | Admitting: Family

## 2014-03-04 ENCOUNTER — Telehealth: Payer: Self-pay | Admitting: *Deleted

## 2014-03-04 ENCOUNTER — Encounter: Payer: Self-pay | Admitting: Family

## 2014-03-04 VITALS — BP 130/82 | HR 79 | Temp 98.1°F | Resp 16 | Ht 66.0 in | Wt 209.1 lb

## 2014-03-04 DIAGNOSIS — R739 Hyperglycemia, unspecified: Secondary | ICD-10-CM

## 2014-03-04 DIAGNOSIS — R7303 Prediabetes: Secondary | ICD-10-CM

## 2014-03-04 DIAGNOSIS — R7309 Other abnormal glucose: Secondary | ICD-10-CM

## 2014-03-04 DIAGNOSIS — I10 Essential (primary) hypertension: Secondary | ICD-10-CM

## 2014-03-04 DIAGNOSIS — Z0000 Encounter for general adult medical examination without abnormal findings: Secondary | ICD-10-CM

## 2014-03-04 DIAGNOSIS — M2550 Pain in unspecified joint: Secondary | ICD-10-CM

## 2014-03-04 LAB — LIPID PANEL
Cholesterol: 265 mg/dL — ABNORMAL HIGH (ref 0–200)
HDL: 39 mg/dL — ABNORMAL LOW (ref >39)
LDL Cholesterol: 191 mg/dL — ABNORMAL HIGH (ref 0–99)
Total CHOL/HDL Ratio: 6.8 Ratio
Triglycerides: 177 mg/dL — ABNORMAL HIGH (ref <150)
VLDL: 35 mg/dL (ref 0–40)

## 2014-03-04 LAB — BASIC METABOLIC PANEL
BUN: 6 mg/dL (ref 6–23)
CO2: 28 mEq/L (ref 19–32)
Calcium: 9.9 mg/dL (ref 8.4–10.5)
Chloride: 103 mEq/L (ref 96–112)
Creat: 0.62 mg/dL (ref 0.50–1.10)
Glucose, Bld: 94 mg/dL (ref 70–99)
Potassium: 3.8 mEq/L (ref 3.5–5.3)
Sodium: 141 mEq/L (ref 135–145)

## 2014-03-04 LAB — HEMOGLOBIN A1C
Hgb A1c MFr Bld: 6.1 % — ABNORMAL HIGH (ref <5.7)
Mean Plasma Glucose: 128 mg/dL — ABNORMAL HIGH (ref <117)

## 2014-03-04 MED ORDER — AMLODIPINE BESYLATE 10 MG PO TABS: 10.0000 mg | ORAL_TABLET | Freq: Every morning | ORAL | Status: DC

## 2014-03-04 MED ORDER — GABAPENTIN 300 MG PO CAPS: 300.0000 mg | ORAL_CAPSULE | Freq: Three times a day (TID) | ORAL | Status: DC

## 2014-03-04 MED ORDER — OMEPRAZOLE 40 MG PO CPDR: 40.0000 mg | DELAYED_RELEASE_CAPSULE | Freq: Every day | ORAL | Status: DC

## 2014-03-04 MED ORDER — MELOXICAM 7.5 MG PO TABS: 7.5000 mg | ORAL_TABLET | Freq: Every day | ORAL | Status: DC

## 2014-03-04 NOTE — Patient Instructions (Signed)
Please complete lab work prior to leaving. Follow up in 3 months, sooner if problems/concerns.  

## 2014-03-04 NOTE — Progress Notes (Signed)
Pre visit review using our clinic review tool, if applicable. No additional management support is needed unless otherwise documented below in the visit note. 

## 2014-03-04 NOTE — Telephone Encounter (Signed)
Noted  

## 2014-03-04 NOTE — Progress Notes (Signed)
Subjective:    Patient ID: Jade [name], female    DOB: 10-Feb-1956, 58 y.o.   MRN: [id number]  HPI  Jade [name] is a 58 yr old female who presents today for follow up.  1) HTN- currently on amlodipine. Denies CP/SOB or swelling.   BP Readings from Last 3 Encounters:  03/04/14 130/82  01/23/14 130/83  01/23/14 130/83   2) Denies daytime somnolence.  Does have snoring. She did not complete sleep study.    3) Borderline DM-   Lab Results  Component Value Date   HGBA1C 6.0* 03/24/2013   4) Knee pain- Requests refill for meloxicam for joint pain prn.  S/p L TKA.  Has severe DJD of the right knee but is hoping to hold off as long as possible before R TKA.   Review of Systems    see HPI  Past Medical History  Diagnosis Date  . GERD (gastroesophageal reflux disease)   . Benign microscopic hematuria     neg cystoscopy 11/12- dr. Janice Norrie  . Hypertension   . Hyperlipidemia   . Shortness of breath     with  exertion  . Numbness and tingling in left arm   . DJD (degenerative joint disease)     L KNEE  . Family history of anesthesia complication     multiple family members with history of this    History   Social History  . Marital Status: Married    Spouse Name: N/A    Number of Children: 3  . Years of Education: N/A   Occupational History  . Not on file.   Social History Main Topics  . Smoking status: Current Every Day Smoker -- 0.50 packs/day for 39 years    Types: Cigarettes  . Smokeless tobacco: Never Used     Comment: 1 1/2 cigarettes daily  . Alcohol Use: No  . Drug Use: No  . Sexual Activity: Not on file   Other Topics Concern  . Not on file   Social History Narrative   Regular exercise:  No   Caffeine Use:  2 cups coffee and 3-4 glasses of soda daily.   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    Past Surgical History  Procedure Laterality Date  . Knee cartilage surgery  1999   right knee  . Abdominal hysterectomy  1996  . Cervical disc surgery  2013    Dr. Arnoldo Morale  . Rotator cuff repair Right 05/25/13  . Tubal ligation    . Total knee arthroplasty Left 01/20/2014    Procedure: LEFT TOTAL KNEE ARTHROPLASTY;  Surgeon: Johnn Hai, MD;  Location: [facility];  Service: Orthopedics;  Laterality: Left;    Family History  Problem Relation Age of Onset  . Hypertension Mother   . Hyperlipidemia Mother   . Heart disease Father     CAD; stent at age 25  . Diabetes Father   . Hypertension Father   . Hyperlipidemia Father   . Cancer Father     prostate  . Cancer Paternal Aunt     BREAST    Allergies  Allergen Reactions  . Ace Inhibitors     Cough  . Aspirin Nausea And Vomiting  . Azithromycin Rash    Current Outpatient Prescriptions on File Prior to Visit  Medication Sig Dispense Refill  . methocarbamol (ROBAXIN) 500 MG tablet Take 1 tablet (500 mg total) by mouth  3 (three) times daily between meals as needed for muscle spasms.  40 tablet  1  . oxyCODONE-acetaminophen (PERCOCET) 5-325 MG per tablet Take 1-2 tablets by mouth every 4 (four) hours as needed.  60 tablet  0   No current facility-administered medications on file prior to visit.    BP 130/82  Pulse 79  Temp(Src) 98.1 F (36.7 C) (Oral)  Resp 16  Ht 5\' 6"  (1.676 m)  Wt 209 lb 1.3 oz (94.838 kg)  BMI 33.76 kg/m2  SpO2 99%  LMP 11/25/1994    Objective:   Physical Exam  Constitutional: She is oriented to person, place, and time. She appears well-developed and well-nourished. No distress.  Cardiovascular: Normal rate and regular rhythm.   No murmur heard. Pulmonary/Chest: Effort normal and breath sounds normal. No respiratory distress. She has no wheezes. She has no rales. She exhibits no tenderness.  Musculoskeletal: She exhibits no edema.  Neurological: She is alert and oriented to person, place, and time.  Psychiatric: She has a normal mood and affect. Her behavior is normal. Judgment  and thought content normal.  Foot exam- normal, no lesions, bilateral dp/pt pulses intact, sensitive to monofilament bilaterally        Assessment & Plan:  Denies current daytime somnolence. Will hold off on sleep study at this time.

## 2014-03-04 NOTE — Assessment & Plan Note (Signed)
Continue prn meloxicam.

## 2014-03-04 NOTE — Telephone Encounter (Signed)
Received call from [facility] wanting to know quantity for gabapentin. Rx was sent with #30 but directions indicate 4 capsules daily. Advised her to dispense #120. Please advise if any further directions.

## 2014-03-04 NOTE — Assessment & Plan Note (Addendum)
Obtain follow up A1C.  Refer for eye exam.

## 2014-03-04 NOTE — Assessment & Plan Note (Signed)
Stable on amlodipine, continue same.  

## 2014-03-06 ENCOUNTER — Telehealth: Payer: Self-pay | Admitting: Family

## 2014-03-06 DIAGNOSIS — E785 Hyperlipidemia, unspecified: Secondary | ICD-10-CM

## 2014-03-06 MED ORDER — PRAVASTATIN SODIUM 80 MG PO TABS: 80.0000 mg | ORAL_TABLET | Freq: Every day | ORAL | Status: DC

## 2014-03-06 NOTE — Telephone Encounter (Signed)
Cholesterol is extremely high. I would like her to start pravastatin.  Repeat flp/lft in 6 weeks. Sugar remains elevated at the "borderline diabetes" range. Continue to work on diabetic/low cholesterol diet and exercise.

## 2014-03-07 NOTE — Telephone Encounter (Signed)
Notified pt and she voices understanding. Lab order entered. 

## 2014-05-30 ENCOUNTER — Ambulatory Visit (INDEPENDENT_AMBULATORY_CARE_PROVIDER_SITE_OTHER): Payer: Medicare HMO | Admitting: Family

## 2014-05-30 ENCOUNTER — Encounter: Payer: Self-pay | Admitting: Family

## 2014-05-30 VITALS — BP 130/84 | HR 73 | Temp 97.9°F | Resp 16 | Ht 66.0 in | Wt 205.1 lb

## 2014-05-30 DIAGNOSIS — E785 Hyperlipidemia, unspecified: Secondary | ICD-10-CM

## 2014-05-30 DIAGNOSIS — K5900 Constipation, unspecified: Secondary | ICD-10-CM

## 2014-05-30 DIAGNOSIS — R7303 Prediabetes: Secondary | ICD-10-CM

## 2014-05-30 DIAGNOSIS — I10 Essential (primary) hypertension: Secondary | ICD-10-CM

## 2014-05-30 DIAGNOSIS — R7309 Other abnormal glucose: Secondary | ICD-10-CM

## 2014-05-30 LAB — BASIC METABOLIC PANEL
BUN: 9 mg/dL (ref 6–23)
CO2: 29 mEq/L (ref 19–32)
Calcium: 9.8 mg/dL (ref 8.4–10.5)
Chloride: 102 mEq/L (ref 96–112)
Creat: 0.68 mg/dL (ref 0.50–1.10)
Glucose, Bld: 89 mg/dL (ref 70–99)
Potassium: 3.7 mEq/L (ref 3.5–5.3)
Sodium: 139 mEq/L (ref 135–145)

## 2014-05-30 LAB — HEMOGLOBIN A1C
Hgb A1c MFr Bld: 6.4 % — ABNORMAL HIGH (ref <5.7)
Mean Plasma Glucose: 137 mg/dL — ABNORMAL HIGH (ref <117)

## 2014-05-30 MED ORDER — LINACLOTIDE 145 MCG PO CAPS: 145.0000 ug | ORAL_CAPSULE | Freq: Every day | ORAL | Status: DC

## 2014-05-30 NOTE — Progress Notes (Signed)
Subjective:     Patient ID: Jade [name], female   DOB: Aug 26, 1956, 58 y.o.   MRN: [id number]  HPI HTN- Pt in for blood pressure check. Pt states previously her bp ran higher. But recently better. Although she does not check. She reports  no blurred vision, no gross motor or sensory function deficits. No hx of stoke or MI.  Hyperlipidemia- Pt states with use of pravastatin she thought her heart felt like heart rate increased. She only took for couple of nights(about 3 month ago). She stopped. Medication on 4th night she stopped med and symptoms resolved. Other medication in the past caused same type symptoms. But neither caused myalgias. Pt states moderate compliance with low cholesterol diet.  Borderline diabetic. Pt diet moderate  low sugar diet. No recent sweet tea but drinks 2 16 oz of pepsi a day. Pt not exercising. Pt had knee replacement left side in November.   Constipation- Pt states up to date on colonoscopy. She tries fiber tabs. She has tried miralax. She states did not help. Then tried dulcolax. Did not help much. Last bm was yesterday. But sometimes takes a week. Normal pattern her entire life.  Pt is a smoker- Pt is using electronic cigarettes. Afraid to use chantix. Smokes 1 pack a day. Never tried wellbutrin per her report.    Review of Systems See ros Past Medical History  Diagnosis Date  . GERD (gastroesophageal reflux disease)   . Benign microscopic hematuria     neg cystoscopy 11/12- dr. Janice Norrie  . Hypertension   . Hyperlipidemia   . Shortness of breath     with  exertion  . Numbness and tingling in left arm   . DJD (degenerative joint disease)     L KNEE  . Family history of anesthesia complication     multiple family members with history of this    History   Social History  . Marital Status: Married    Spouse Name: N/A    Number of Children: 3  . Years of Education: N/A   Occupational History  . Not on file.   Social History Main Topics  . Smoking  status: Current Every Day Smoker -- 0.50 packs/day for 39 years    Types: Cigarettes  . Smokeless tobacco: Never Used     Comment: 1 1/2 cigarettes daily  . Alcohol Use: No  . Drug Use: No  . Sexual Activity: Not on file   Other Topics Concern  . Not on file   Social History Narrative   Regular exercise:  No   Caffeine Use:  2 cups coffee and 3-4 glasses of soda daily.   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    Past Surgical History  Procedure Laterality Date  . Knee cartilage surgery  1999    right knee  . Abdominal hysterectomy  1996  . Cervical disc surgery  2013    Dr. Arnoldo Morale  . Rotator cuff repair Right 05/25/13  . Tubal ligation    . Total knee arthroplasty Left 01/20/2014    Procedure: LEFT TOTAL KNEE ARTHROPLASTY;  Surgeon: Johnn Hai, MD;  Location: [facility];  Service: Orthopedics;  Laterality: Left;    Family History  Problem Relation Age of Onset  . Hypertension Mother   . Hyperlipidemia Mother   . Heart disease Father     CAD; stent at age 45  . Diabetes Father   .  Hypertension Father   . Hyperlipidemia Father   . Cancer Father     prostate  . Cancer Paternal Aunt     BREAST    Allergies  Allergen Reactions  . Ace Inhibitors     Cough  . Aspirin Nausea And Vomiting  . Azithromycin Rash    Current Outpatient Prescriptions on File Prior to Visit  Medication Sig Dispense Refill  . amLODipine (NORVASC) 10 MG tablet Take 1 tablet (10 mg total) by mouth every morning.  30 tablet  2  . gabapentin (NEURONTIN) 300 MG capsule Takes 1 in the morning 1 at noon and 2 at bedtime      . meloxicam (MOBIC) 7.5 MG tablet Take 1 tablet (7.5 mg total) by mouth daily.  14 tablet  0  . methocarbamol (ROBAXIN) 500 MG tablet Take 1 tablet (500 mg total) by mouth 3 (three) times daily between meals as needed for muscle spasms.  40 tablet  1  . omeprazole (PRILOSEC) 40 MG capsule Take 1 capsule (40 mg total)  by mouth daily.  30 capsule  5  . oxyCODONE-acetaminophen (PERCOCET) 5-325 MG per tablet Take 1-2 tablets by mouth every 4 (four) hours as needed.  60 tablet  0   No current facility-administered medications on file prior to visit.    BP 130/84  Pulse 73  Temp(Src) 97.9 F (36.6 C) (Oral)  Resp 16  Ht 5\' 6"  (1.676 m)  Wt 205 lb 1.3 oz (93.024 kg)  BMI 33.12 kg/m2  SpO2 97%  LMP 11/25/1994      Objective:   Physical Exam General Mental Status- Alert. General Appearance- Not in acute distress.  Skin General:-Color-Normal Color. Moisture- Normal Moisture.  Neck Carotid Arteries- normal upstroke and runoff. No JVD. No bruits.  Chest and Lung Exam Auscultation:Rhythm- Regular. Murmurs & Other Heart Sounds: Auscultation of the heart reveals- No murmurs.  Abdomen Inspection:-Inspection Normal. Palpation/Percussion:Note:No mass. Palpation and Percussion of the abdomen reveal- Non Tender.   Knees- Rt knee from. No tenderness. Lt knee from but more crepitus lt knee.  Neuro- CN III-XII grossly intact. No gross motor or sensory function deficits.  Peripheral Vascular Lower Extremity: Palpation: Dorsalis pedis pulse- Bilateral- Normal. Edema- Bilateral- No edema.     Assessment:    A/P  1. Htn- Her bp is relatively controlled. Will continue same dose. Did encourage her to check at least one time a week. Or get a machine.  2. Hyperlipidemia. Follow labs today and see if how they are compared to past lipids.  3. Borderline diabetic- Counseled on importance of diet. Counseled specifically to stop pepsi/carbonated beverages. She can't exercise presently due to knee surgery hx and upcoming possible replacement rt side Follow a1-c. And bmp.   4. Constipation- Stay hydrated, increase vegetables, and fiber. She would like to try linzess.   Tobacco abuse. Consider trial of wellbutrin next visit.     Plan:     Patient seen along with Evern Core PA-C who is currently in  orientation for training purposes. I have personally examined pt and agree with assessment and plan.

## 2014-05-30 NOTE — Patient Instructions (Signed)
Complete lab work before leaving. Take linzess daily for constipation. Diet and exercise for borderline diabetes. Follow low cholesterol diet.  Follow up in 3 months.

## 2014-05-30 NOTE — Progress Notes (Signed)
Pre visit review using our clinic review tool, if applicable. No additional management support is needed unless otherwise documented below in the visit note. 

## 2014-05-31 ENCOUNTER — Encounter: Payer: Self-pay | Admitting: Family

## 2014-07-04 ENCOUNTER — Telehealth: Payer: Self-pay | Admitting: Family

## 2014-07-04 ENCOUNTER — Encounter: Payer: Self-pay | Admitting: Family

## 2014-07-04 ENCOUNTER — Ambulatory Visit (INDEPENDENT_AMBULATORY_CARE_PROVIDER_SITE_OTHER): Payer: Medicare HMO | Admitting: Family

## 2014-07-04 VITALS — BP 122/88 | HR 84 | Temp 97.8°F | Resp 16 | Ht 66.0 in | Wt 206.0 lb

## 2014-07-04 DIAGNOSIS — N949 Unspecified condition associated with female genital organs and menstrual cycle: Secondary | ICD-10-CM

## 2014-07-04 DIAGNOSIS — R300 Dysuria: Secondary | ICD-10-CM | POA: Diagnosis not present

## 2014-07-04 DIAGNOSIS — J0110 Acute frontal sinusitis, unspecified: Secondary | ICD-10-CM | POA: Insufficient documentation

## 2014-07-04 DIAGNOSIS — R102 Pelvic and perineal pain: Secondary | ICD-10-CM | POA: Insufficient documentation

## 2014-07-04 DIAGNOSIS — R3129 Other microscopic hematuria: Secondary | ICD-10-CM | POA: Diagnosis not present

## 2014-07-04 LAB — POCT URINALYSIS DIPSTICK
Bilirubin, UA: NEGATIVE
Glucose, UA: NEGATIVE
Ketones, UA: NEGATIVE
Leukocytes, UA: NEGATIVE
Nitrite, UA: NEGATIVE
Protein, UA: NEGATIVE
Spec Grav, UA: <=1.005
Urobilinogen, UA: 0.2
pH, UA: 6

## 2014-07-04 MED ORDER — LEVOFLOXACIN 500 MG PO TABS
500.0000 mg | ORAL_TABLET | Freq: Every day | ORAL | Status: DC
Start: 2014-07-04 — End: 2014-08-29

## 2014-07-04 MED ORDER — FLUCONAZOLE 150 MG PO TABS: ORAL_TABLET | ORAL | Status: DC

## 2014-07-04 MED ORDER — MELOXICAM 7.5 MG PO TABS: 7.5000 mg | ORAL_TABLET | Freq: Every day | ORAL | Status: DC

## 2014-07-04 NOTE — Addendum Note (Signed)
Addended by: Kelle Darting A on: 07/04/2014 04:11 PM   Modules accepted: Orders

## 2014-07-04 NOTE — Assessment & Plan Note (Signed)
Will rx with levaquin.

## 2014-07-04 NOTE — Telephone Encounter (Signed)
Relevant patient education mailed to patient.  

## 2014-07-04 NOTE — Progress Notes (Signed)
Pre visit review using our clinic review tool, if applicable. No additional management support is needed unless otherwise documented below in the visit note. 

## 2014-07-04 NOTE — Patient Instructions (Signed)
Start Levaquin- for sinus and urine. Call if symptoms worsen,or if not improved in 2-3 days.

## 2014-07-04 NOTE — Progress Notes (Signed)
Subjective:    Patient ID: Jade [name], female    DOB: 1956/10/08, 58 y.o.   MRN: [id number]  HPI  Ms. Pelland is a 58 yr old female who presents today with two concerns:  1) Facial pain- reports pressure behind her eyes and left ear discomfort. Feels clogged on the left cheek.  Using Zyrtec without improvement in her symptoms. She denies associated fever.    2) Pelvic pain- pain is located in the left lower side and has been present x 1 week.  This am she noted dysuria. She denies dysuria.  She denies hx of kidney stone.     Review of Systems See HPI  Past Medical History  Diagnosis Date  . GERD (gastroesophageal reflux disease)   . Benign microscopic hematuria     neg cystoscopy 11/12- dr. Janice Norrie  . Hypertension   . Hyperlipidemia   . Shortness of breath     with  exertion  . Numbness and tingling in left arm   . DJD (degenerative joint disease)     L KNEE  . Family history of anesthesia complication     multiple family members with history of this    History   Social History  . Marital Status: Married    Spouse Name: N/A    Number of Children: 3  . Years of Education: N/A   Occupational History  . Not on file.   Social History Main Topics  . Smoking status: Current Every Day Smoker -- 0.50 packs/day for 39 years    Types: Cigarettes  . Smokeless tobacco: Never Used     Comment: 1 1/2 cigarettes daily  . Alcohol Use: No  . Drug Use: No  . Sexual Activity: Not on file   Other Topics Concern  . Not on file   Social History Narrative   Regular exercise:  No   Caffeine Use:  2 cups coffee and 3-4 glasses of soda daily.   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    Past Surgical History  Procedure Laterality Date  . Knee cartilage surgery  1999    right knee  . Abdominal hysterectomy  1996  . Cervical disc surgery  2013    Dr. Arnoldo Morale  . Rotator cuff repair Right 05/25/13  . Tubal  ligation    . Total knee arthroplasty Left 01/20/2014    Procedure: LEFT TOTAL KNEE ARTHROPLASTY;  Surgeon: Johnn Hai, MD;  Location: [facility];  Service: Orthopedics;  Laterality: Left;    Family History  Problem Relation Age of Onset  . Hypertension Mother   . Hyperlipidemia Mother   . Heart disease Father     CAD; stent at age 59  . Diabetes Father   . Hypertension Father   . Hyperlipidemia Father   . Cancer Father     prostate  . Cancer Paternal Aunt     BREAST    Allergies  Allergen Reactions  . Ace Inhibitors     Cough  . Aspirin Nausea And Vomiting  . Azithromycin Rash    Current Outpatient Prescriptions on File Prior to Visit  Medication Sig Dispense Refill  . amLODipine (NORVASC) 10 MG tablet Take 1 tablet (10 mg total) by mouth every morning.  30 tablet  2  . gabapentin (NEURONTIN) 300 MG capsule Takes 1 in the morning 1 at noon and 2 at bedtime      .  Linaclotide (LINZESS) 145 MCG CAPS capsule Take 1 capsule (145 mcg total) by mouth daily.  30 capsule  2  . meloxicam (MOBIC) 7.5 MG tablet Take 1 tablet (7.5 mg total) by mouth daily.  14 tablet  0  . methocarbamol (ROBAXIN) 500 MG tablet Take 1 tablet (500 mg total) by mouth 3 (three) times daily between meals as needed for muscle spasms.  40 tablet  1  . omeprazole (PRILOSEC) 40 MG capsule Take 1 capsule (40 mg total) by mouth daily.  30 capsule  5  . oxyCODONE-acetaminophen (PERCOCET) 5-325 MG per tablet Take 1-2 tablets by mouth every 4 (four) hours as needed.  60 tablet  0   No current facility-administered medications on file prior to visit.    BP 122/88  Pulse 84  Temp(Src) 97.8 F (36.6 C) (Oral)  Resp 16  Ht 5\' 6"  (1.676 m)  Wt 206 lb (93.441 kg)  BMI 33.27 kg/m2  SpO2 98%  LMP 11/25/1994       Objective:   Physical Exam  Constitutional: She is oriented to person, place, and time. She appears well-developed and well-nourished. No distress.  HENT:  Head: Normocephalic and atraumatic.    Right Ear: Tympanic membrane and ear canal normal.  Left Ear: Tympanic membrane and ear canal normal.  Mouth/Throat: No oropharyngeal exudate, posterior oropharyngeal edema or posterior oropharyngeal erythema.  Left frontal sinus tenderness to palpation  Eyes: No scleral icterus.  Cardiovascular: Normal rate and regular rhythm.   No murmur heard. Pulmonary/Chest: Effort normal and breath sounds normal. No respiratory distress. She has no wheezes. She has no rales. She exhibits no tenderness.  Abdominal: Soft. Bowel sounds are normal. She exhibits no distension. There is no CVA tenderness.  Mild left lower quad tenderness without guarding  Lymphadenopathy:    She has no cervical adenopathy.  Neurological: She is alert and oriented to person, place, and time.  Psychiatric: She has a normal mood and affect. Her behavior is normal. Judgment and thought content normal.          Assessment & Plan:

## 2014-07-04 NOTE — Assessment & Plan Note (Signed)
Given dysuria, microscopic hematuria on UA suspect urinary source. Rx with levaquin and send urine for culture. If no improvement may need imaging. I have asked her to call me if not improced by Wednesday.

## 2014-07-05 LAB — URINE CULTURE
Colony Count: NO GROWTH
Organism ID, Bacteria: NO GROWTH

## 2014-08-03 ENCOUNTER — Other Ambulatory Visit: Payer: Self-pay | Admitting: Family

## 2014-08-29 ENCOUNTER — Ambulatory Visit (INDEPENDENT_AMBULATORY_CARE_PROVIDER_SITE_OTHER): Payer: Medicare HMO | Admitting: Family

## 2014-08-29 ENCOUNTER — Encounter: Payer: Self-pay | Admitting: Family

## 2014-08-29 VITALS — BP 122/90 | HR 83 | Temp 98.0°F | Resp 16 | Ht 66.0 in | Wt 212.2 lb

## 2014-08-29 DIAGNOSIS — R7309 Other abnormal glucose: Secondary | ICD-10-CM

## 2014-08-29 DIAGNOSIS — E785 Hyperlipidemia, unspecified: Secondary | ICD-10-CM

## 2014-08-29 DIAGNOSIS — R3129 Other microscopic hematuria: Secondary | ICD-10-CM

## 2014-08-29 DIAGNOSIS — Z23 Encounter for immunization: Secondary | ICD-10-CM

## 2014-08-29 DIAGNOSIS — R7303 Prediabetes: Secondary | ICD-10-CM

## 2014-08-29 DIAGNOSIS — R739 Hyperglycemia, unspecified: Secondary | ICD-10-CM

## 2014-08-29 DIAGNOSIS — K589 Irritable bowel syndrome without diarrhea: Secondary | ICD-10-CM | POA: Insufficient documentation

## 2014-08-29 DIAGNOSIS — I10 Essential (primary) hypertension: Secondary | ICD-10-CM

## 2014-08-29 DIAGNOSIS — R312 Other microscopic hematuria: Secondary | ICD-10-CM

## 2014-08-29 LAB — URINALYSIS, ROUTINE W REFLEX MICROSCOPIC
Bilirubin Urine: NEGATIVE
Ketones, ur: NEGATIVE
Leukocytes, UA: NEGATIVE
Nitrite: NEGATIVE
Specific Gravity, Urine: <=1.005 — AB (ref 1.000–1.030)
Total Protein, Urine: NEGATIVE
Urine Glucose: NEGATIVE
Urobilinogen, UA: 0.2 (ref 0.0–1.0)
pH: 5.5 (ref 5.0–8.0)

## 2014-08-29 LAB — LIPID PANEL
Cholesterol: 286 mg/dL — ABNORMAL HIGH (ref 0–200)
HDL: 46.9 mg/dL (ref >39.00)
LDL Cholesterol: 210 mg/dL — ABNORMAL HIGH (ref 0–99)
NonHDL: 239.1
Total CHOL/HDL Ratio: 6
Triglycerides: 146 mg/dL (ref 0.0–149.0)
VLDL: 29.2 mg/dL (ref 0.0–40.0)

## 2014-08-29 LAB — BASIC METABOLIC PANEL
BUN: 11 mg/dL (ref 6–23)
CO2: 26 mEq/L (ref 19–32)
Calcium: 9.5 mg/dL (ref 8.4–10.5)
Chloride: 104 mEq/L (ref 96–112)
Creatinine, Ser: 0.7 mg/dL (ref 0.4–1.2)
GFR: 113.48 mL/min (ref >60.00)
Glucose, Bld: 99 mg/dL (ref 70–99)
Potassium: 3.4 mEq/L — ABNORMAL LOW (ref 3.5–5.1)
Sodium: 137 mEq/L (ref 135–145)

## 2014-08-29 LAB — HEPATIC FUNCTION PANEL
ALT: 21 U/L (ref 0–35)
AST: 15 U/L (ref 0–37)
Albumin: 4.2 g/dL (ref 3.5–5.2)
Alkaline Phosphatase: 97 U/L (ref 39–117)
Bilirubin, Direct: 0.1 mg/dL (ref 0.0–0.3)
Total Bilirubin: 0.5 mg/dL (ref 0.2–1.2)
Total Protein: 7.9 g/dL (ref 6.0–8.3)

## 2014-08-29 LAB — HEMOGLOBIN A1C: Hgb A1c MFr Bld: 6.1 % (ref 4.6–6.5)

## 2014-08-29 MED ORDER — LINACLOTIDE 145 MCG PO CAPS
145.0000 ug | ORAL_CAPSULE | Freq: Every day | ORAL | Status: DC
Start: 2014-08-29 — End: 2015-04-11

## 2014-08-29 MED ORDER — MELOXICAM 7.5 MG PO TABS: 7.5000 mg | ORAL_TABLET | Freq: Every day | ORAL | Status: DC

## 2014-08-29 NOTE — Progress Notes (Signed)
Subjective:    Patient ID: Jade [name], female    DOB: 15-May-1956, 58 y.o.   MRN: [id number]  HPI  Ms. Mannina is a 58 yr old female who presents today for follow up of multiple medical problems.  1) HTN- maintained on amlodipine.   BP Readings from Last 3 Encounters:  08/29/14 122/90  07/04/14 122/88  05/30/14 130/84   2) Hyperlipidemia- She was advised to start pravastatin in April. She reported that the med made her feel "funny."   3) Borderline DM-  This has been diet controlled. Lab Results  Component Value Date   HGBA1C 6.4* 05/30/2014   HGBA1C 6.1* 03/04/2014   HGBA1C 6.0* 03/24/2013   Lab Results  Component Value Date   LDLCALC 191* 03/04/2014   CREATININE 0.68 05/30/2014   4) IBS- reports regular daily BMs on linzess.     Review of Systems See HPI  Past Medical History  Diagnosis Date  . GERD (gastroesophageal reflux disease)   . Benign microscopic hematuria     neg cystoscopy 11/12- dr. Janice Norrie  . Hypertension   . Hyperlipidemia   . Shortness of breath     with  exertion  . Numbness and tingling in left arm   . DJD (degenerative joint disease)     L KNEE  . Family history of anesthesia complication     multiple family members with history of this    History   Social History  . Marital Status: Married    Spouse Name: N/A    Number of Children: 3  . Years of Education: N/A   Occupational History  . Not on file.   Social History Main Topics  . Smoking status: Current Every Day Smoker -- 0.50 packs/day for 39 years    Types: Cigarettes  . Smokeless tobacco: Never Used     Comment: 1 1/2 cigarettes daily  . Alcohol Use: No  . Drug Use: No  . Sexual Activity: Not on file   Other Topics Concern  . Not on file   Social History Narrative   Regular exercise:  No   Caffeine Use:  2 cups coffee and 3-4 glasses of soda daily.   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    Past  Surgical History  Procedure Laterality Date  . Knee cartilage surgery  1999    right knee  . Abdominal hysterectomy  1996  . Cervical disc surgery  2013    Dr. Arnoldo Morale  . Rotator cuff repair Right 05/25/13  . Tubal ligation    . Total knee arthroplasty Left 01/20/2014    Procedure: LEFT TOTAL KNEE ARTHROPLASTY;  Surgeon: Johnn Hai, MD;  Location: [facility];  Service: Orthopedics;  Laterality: Left;    Family History  Problem Relation Age of Onset  . Hypertension Mother   . Hyperlipidemia Mother   . Heart disease Father     CAD; stent at age 8  . Diabetes Father   . Hypertension Father   . Hyperlipidemia Father   . Cancer Father     prostate  . Cancer Paternal Aunt     BREAST    Allergies  Allergen Reactions  . Ace Inhibitors     Cough  . Aspirin Nausea And Vomiting  . Azithromycin Rash    Current Outpatient Prescriptions on File Prior to Visit  Medication Sig Dispense Refill  . amLODipine (NORVASC) 10 MG  tablet TAKE 1 TABLET BY MOUTH EVERY MORNING.  30 tablet  2  . gabapentin (NEURONTIN) 300 MG capsule Takes 1 in the morning 1 at noon and 2 at bedtime      . Linaclotide (LINZESS) 145 MCG CAPS capsule Take 1 capsule (145 mcg total) by mouth daily.  30 capsule  2  . meloxicam (MOBIC) 7.5 MG tablet Take 1 tablet (7.5 mg total) by mouth daily.  14 tablet  0  . methocarbamol (ROBAXIN) 500 MG tablet Take 1 tablet (500 mg total) by mouth 3 (three) times daily between meals as needed for muscle spasms.  40 tablet  1  . omeprazole (PRILOSEC) 40 MG capsule Take 1 capsule (40 mg total) by mouth daily.  30 capsule  5  . oxyCODONE-acetaminophen (PERCOCET) 5-325 MG per tablet Take 1-2 tablets by mouth every 4 (four) hours as needed.  60 tablet  0   No current facility-administered medications on file prior to visit.    BP 122/90  Pulse 83  Temp(Src) 98 F (36.7 C) (Oral)  Resp 16  Ht 5\' 6"  (1.676 m)  Wt 212 lb 3.2 oz (96.253 kg)  BMI 34.27 kg/m2  SpO2 100%  LMP  11/25/1994       Objective:   Physical Exam  Constitutional: She is oriented to person, place, and time. She appears well-developed and well-nourished. No distress.  HENT:  Head: Normocephalic and atraumatic.  Cardiovascular: Normal rate and regular rhythm.   No murmur heard. Pulmonary/Chest: Effort normal and breath sounds normal. No respiratory distress. She has no wheezes. She has no rales. She exhibits no tenderness.  Musculoskeletal: She exhibits no edema.  Neurological: She is alert and oriented to person, place, and time.  Psychiatric: She has a normal mood and affect. Her behavior is normal. Judgment and thought content normal.          Assessment & Plan:

## 2014-08-29 NOTE — Assessment & Plan Note (Signed)
Stable on linzess 

## 2014-08-29 NOTE — Progress Notes (Signed)
Pre visit review using our clinic review tool, if applicable. No additional management support is needed unless otherwise documented below in the visit note. 

## 2014-08-29 NOTE — Patient Instructions (Signed)
Please schedule a surgical clearance appointment for November. Complete lab work prior to leaving.

## 2014-08-29 NOTE — Assessment & Plan Note (Signed)
>>  ASSESSMENT AND PLAN FOR DYSLIPIDEMIA WRITTEN ON 08/29/2014  1:23 PM BY O'SULLIVAN, Jaquis Picklesimer, NP  Has been working on diet, repeat FLP.

## 2014-08-29 NOTE — Assessment & Plan Note (Signed)
Has been working on diet, repeat FLP.

## 2014-08-29 NOTE — Assessment & Plan Note (Signed)
Stable on amlodipine, continue same.  

## 2014-08-29 NOTE — Assessment & Plan Note (Signed)
Stable on diet alone, obtain follow up A1C.

## 2014-08-30 ENCOUNTER — Telehealth: Payer: Self-pay | Admitting: Family

## 2014-08-30 DIAGNOSIS — E785 Hyperlipidemia, unspecified: Secondary | ICD-10-CM

## 2014-08-30 MED ORDER — ATORVASTATIN CALCIUM 40 MG PO TABS: 40.0000 mg | ORAL_TABLET | Freq: Every day | ORAL | Status: DC

## 2014-08-30 NOTE — Telephone Encounter (Signed)
Potassium is low. She needs to work on increasing potassium rich foods such as beans, fish, squash, sweet potatoes.   Cholesterol is extremely high- bad cholesterol is 210, should be <100 to decrease risk of heart disease.  I would like her to try a different statin to see if she can better tolerate.  Call me if she has problems. Repeat flp/lft in 6 weeks. Dx hyperlipidemia. Sugar function has improved.

## 2014-08-31 NOTE — Telephone Encounter (Signed)
Notified pt and she voices understanding. STates she is out of town and will pick up medication next week when she returns. Lab appt scheduled for 10/18/14 at 8:15am. Lab orders entered.

## 2014-09-30 ENCOUNTER — Encounter: Payer: Self-pay | Admitting: Family

## 2014-09-30 ENCOUNTER — Other Ambulatory Visit: Payer: Self-pay | Admitting: Family

## 2014-09-30 ENCOUNTER — Ambulatory Visit (INDEPENDENT_AMBULATORY_CARE_PROVIDER_SITE_OTHER): Payer: Medicare HMO | Admitting: Family

## 2014-09-30 ENCOUNTER — Telehealth: Payer: Self-pay | Admitting: *Deleted

## 2014-09-30 ENCOUNTER — Ambulatory Visit (HOSPITAL_BASED_OUTPATIENT_CLINIC_OR_DEPARTMENT_OTHER)
Admission: RE | Admit: 2014-09-30 | Discharge: 2014-09-30 | Disposition: A | Discharge status: Home / Self Care | Payer: Medicare HMO | Source: Ambulatory Visit | Attending: Family | Admitting: Family

## 2014-09-30 VITALS — BP 140/82 | HR 86 | Temp 97.8°F | Resp 16 | Ht 66.0 in | Wt 215.6 lb

## 2014-09-30 DIAGNOSIS — M170 Bilateral primary osteoarthritis of knee: Secondary | ICD-10-CM | POA: Insufficient documentation

## 2014-09-30 DIAGNOSIS — I10 Essential (primary) hypertension: Secondary | ICD-10-CM | POA: Diagnosis not present

## 2014-09-30 DIAGNOSIS — M1711 Unilateral primary osteoarthritis, right knee: Secondary | ICD-10-CM

## 2014-09-30 DIAGNOSIS — Z01818 Encounter for other preprocedural examination: Secondary | ICD-10-CM | POA: Diagnosis not present

## 2014-09-30 DIAGNOSIS — M179 Osteoarthritis of knee, unspecified: Secondary | ICD-10-CM

## 2014-09-30 DIAGNOSIS — F17210 Nicotine dependence, cigarettes, uncomplicated: Secondary | ICD-10-CM | POA: Diagnosis not present

## 2014-09-30 DIAGNOSIS — E876 Hypokalemia: Secondary | ICD-10-CM

## 2014-09-30 LAB — CBC WITH DIFFERENTIAL/PLATELET
Basophils Absolute: 0 10*3/uL (ref 0.0–0.1)
Basophils Relative: 0.6 % (ref 0.0–3.0)
Eosinophils Absolute: 0.1 10*3/uL (ref 0.0–0.7)
Eosinophils Relative: 1.3 % (ref 0.0–5.0)
HCT: 43.3 % (ref 36.0–46.0)
Hemoglobin: 14.2 g/dL (ref 12.0–15.0)
Lymphocytes Relative: 34.7 % (ref 12.0–46.0)
Lymphs Abs: 2.9 10*3/uL (ref 0.7–4.0)
MCHC: 32.7 g/dL (ref 30.0–36.0)
MCV: 83.3 fl (ref 78.0–100.0)
Monocytes Absolute: 0.7 10*3/uL (ref 0.1–1.0)
Monocytes Relative: 8.1 % (ref 3.0–12.0)
Neutro Abs: 4.5 10*3/uL (ref 1.4–7.7)
Neutrophils Relative %: 55.3 % (ref 43.0–77.0)
Platelets: 344 10*3/uL (ref 150.0–400.0)
RBC: 5.2 Mil/uL — ABNORMAL HIGH (ref 3.87–5.11)
RDW: 13.8 % (ref 11.5–15.5)
WBC: 8.2 10*3/uL (ref 4.0–10.5)

## 2014-09-30 LAB — BASIC METABOLIC PANEL
BUN: 11 mg/dL (ref 6–23)
CO2: 28 mEq/L (ref 19–32)
Calcium: 9.5 mg/dL (ref 8.4–10.5)
Chloride: 106 mEq/L (ref 96–112)
Creatinine, Ser: 0.7 mg/dL (ref 0.4–1.2)
GFR: 115.41 mL/min (ref >60.00)
Glucose, Bld: 71 mg/dL (ref 70–99)
Potassium: 3.2 mEq/L — ABNORMAL LOW (ref 3.5–5.1)
Sodium: 143 mEq/L (ref 135–145)

## 2014-09-30 LAB — PROTIME-INR
INR: 1 ratio (ref 0.8–1.0)
Prothrombin Time: 10.7 s (ref 9.6–13.1)

## 2014-09-30 LAB — HEPATIC FUNCTION PANEL
ALT: 27 U/L (ref 0–35)
AST: 19 U/L (ref 0–37)
Albumin: 3.5 g/dL (ref 3.5–5.2)
Alkaline Phosphatase: 102 U/L (ref 39–117)
Bilirubin, Direct: 0 mg/dL (ref 0.0–0.3)
Total Bilirubin: 0.3 mg/dL (ref 0.2–1.2)
Total Protein: 7.5 g/dL (ref 6.0–8.3)

## 2014-09-30 LAB — APTT: aPTT: 29.4 s (ref 23.4–32.7)

## 2014-09-30 MED ORDER — GABAPENTIN 300 MG PO CAPS: ORAL_CAPSULE | ORAL | Status: DC

## 2014-09-30 MED ORDER — MELOXICAM 7.5 MG PO TABS: 7.5000 mg | ORAL_TABLET | Freq: Every day | ORAL | Status: DC

## 2014-09-30 NOTE — Assessment & Plan Note (Signed)
For R TKA. Will obtain labs, urine culture to rule out UTI, EKG, CXR.  Clearance pending review of these tests.

## 2014-09-30 NOTE — Telephone Encounter (Signed)
Patient dropped off surgical clearance form. Form forwarded to [facility]. JG//CMA

## 2014-09-30 NOTE — Assessment & Plan Note (Signed)
>>  ASSESSMENT AND PLAN FOR RIGHT KNEE DJD WRITTEN ON 09/30/2014  8:42 AM BY O'SULLIVAN, Lesly Pontarelli, NP  For R TKA. Will obtain labs, urine culture to rule out UTI, EKG, CXR.  Clearance pending review of these tests.

## 2014-09-30 NOTE — Patient Instructions (Addendum)
Please complete lab work prior to leaving. Complete chest x ray on the first floor.  Follow up in 3 months.  

## 2014-09-30 NOTE — Addendum Note (Signed)
Addended by: Kelle Darting A on: 09/30/2014 10:29 AM   Modules accepted: Orders

## 2014-09-30 NOTE — Progress Notes (Signed)
Subjective:    Patient ID: Jade [name], female    DOB: June 14, 1956, 58 y.o.   MRN: [id number]  HPI  Ms. Jade [name] is a 58 yr old female who presents today for surgical clearance.  She is scheduled for right TKR.  The surgery will be scheduled by Dr. Maxie Better.   R knee DJD- reports severe pain with walking. She has been using percocet which helps with the pain.    Review of Systems  Constitutional:       Wt Readings from Last 3 Encounters: 09/30/14 : 215 lb 9.6 oz (97.796 kg) 08/29/14 : 212 lb 3.2 oz (96.253 kg) 07/04/14 : 206 lb (93.441 kg)  Has gained weight since the summer.   HENT: Negative for rhinorrhea.        Some mild discomfort in the left ear.   Respiratory: Negative for shortness of breath.        Mild cough which she attributes to weather.  Cardiovascular: Negative for chest pain.  Gastrointestinal: Negative for diarrhea and constipation.  Genitourinary: Negative for dysuria and frequency.  Musculoskeletal: Positive for arthralgias.  Skin: Negative for rash.  Neurological:       Reports occasional headaches.    Hematological: Negative for adenopathy.  Psychiatric/Behavioral:       Denies depression    Past Medical History  Diagnosis Date  . GERD (gastroesophageal reflux disease)   . Benign microscopic hematuria     neg cystoscopy 11/12- dr. Janice Jade [name]  . Hypertension   . Hyperlipidemia   . Shortness of breath     with  exertion  . Numbness and tingling in left arm   . DJD (degenerative joint disease)     L KNEE  . Family history of anesthesia complication     multiple family members with history of this    History   Social History  . Marital Status: Married    Spouse Name: N/A    Number of Children: 3  . Years of Education: N/A   Occupational History  . Not on file.   Social History Main Topics  . Smoking status: Current Every Day Smoker -- 0.50 packs/day for 39 years    Types: Cigarettes  . Smokeless tobacco: Never Used     Comment: 1 1/2  cigarettes daily  . Alcohol Use: No  . Drug Use: No  . Sexual Activity: Not on file   Other Topics Concern  . Not on file   Social History Narrative   Regular exercise:  No   Caffeine Use:  2 cups coffee and 3-4 glasses of soda daily.   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    Past Surgical History  Procedure Laterality Date  . Knee cartilage surgery  1999    right knee  . Abdominal hysterectomy  1996  . Cervical disc surgery  2013    Dr. Arnoldo [name]  . Rotator cuff repair Right 05/25/13  . Tubal ligation    . Total knee arthroplasty Left 01/20/2014    Procedure: LEFT TOTAL KNEE ARTHROPLASTY;  Surgeon: Jade Hai, MD;  Location: [facility];  Service: Orthopedics;  Laterality: Left;    Family History  Problem Relation Age of Onset  . Hypertension Mother   . Hyperlipidemia Mother   . Heart disease Father     CAD; stent at age 48  . Diabetes Father   . Hypertension Father   .  Hyperlipidemia Father   . Cancer Father     prostate  . Cancer Paternal Aunt     BREAST    Allergies  Allergen Reactions  . Ace Inhibitors     Cough  . Aspirin Nausea And Vomiting  . Azithromycin Rash    Current Outpatient Prescriptions on File Prior to Visit  Medication Sig Dispense Refill  . amLODipine (NORVASC) 10 MG tablet TAKE 1 TABLET BY MOUTH EVERY MORNING. 30 tablet 2  . atorvastatin (LIPITOR) 40 MG tablet Take 1 tablet (40 mg total) by mouth daily. 30 tablet 1  . gabapentin (NEURONTIN) 300 MG capsule Takes 1 in the morning 1 at noon and 2 at bedtime    . Linaclotide (LINZESS) 145 MCG CAPS capsule Take 1 capsule (145 mcg total) by mouth daily. 30 capsule 5  . meloxicam (MOBIC) 7.5 MG tablet Take 1 tablet (7.5 mg total) by mouth daily. 30 tablet 0  . methocarbamol (ROBAXIN) 500 MG tablet Take 1 tablet (500 mg total) by mouth 3 (three) times daily between meals as needed for muscle spasms. 40 tablet 1  . omeprazole (PRILOSEC)  40 MG capsule Take 1 capsule (40 mg total) by mouth daily. 30 capsule 5  . oxyCODONE-acetaminophen (PERCOCET) 5-325 MG per tablet Take 1-2 tablets by mouth every 4 (four) hours as needed. 60 tablet 0   No current facility-administered medications on file prior to visit.    BP 140/82 mmHg  Pulse 86  Temp(Src) 97.8 F (36.6 C) (Oral)  Resp 16  Ht 5\' 6"  (1.676 m)  Wt 215 lb 9.6 oz (97.796 kg)  BMI 34.82 kg/m2  SpO2 99%  LMP 11/25/1994       Objective:   Physical Exam  Physical Exam  Constitutional: She is oriented to person, place, and time. She appears well-developed and well-nourished. No distress.  HENT:  Head: Normocephalic and atraumatic.  Right Ear: Tympanic membrane and ear canal normal.  Left Ear: Tympanic membrane and ear canal normal.  Mouth/Throat: Oropharynx is clear and moist.  Eyes: Pupils are equal, round, and reactive to light. No scleral icterus.  Neck: Normal range of motion. No thyromegaly present.  Cardiovascular: Normal rate and regular rhythm.   No murmur heard. Pulmonary/Chest: Effort normal and breath sounds normal. No respiratory distress. He has no wheezes. She has no rales. She exhibits no tenderness.  Abdominal: Soft. Bowel sounds are normal. He exhibits no distension and no mass. There is no tenderness. There is no rebound and no guarding.  Musculoskeletal: She exhibits some swelling of right knee Lymphadenopathy:    She has no cervical adenopathy.  Neurological: She is alert and oriented to person, place, and time. She has normal reflexes. She exhibits normal muscle tone. Coordination normal.  Skin: Skin is warm and dry.  Psychiatric: She has a normal mood and affect. Her behavior is normal. Judgment and thought content normal          Assessment & Plan:         Assessment & Plan:

## 2014-09-30 NOTE — Progress Notes (Signed)
Pre visit review using our clinic review tool, if applicable. No additional management support is needed unless otherwise documented below in the visit note. 

## 2014-10-03 LAB — URINE CULTURE

## 2014-10-07 MED ORDER — POTASSIUM CHLORIDE CRYS ER 20 MEQ PO TBCR: 20.0000 meq | EXTENDED_RELEASE_TABLET | Freq: Every day | ORAL | Status: DC

## 2014-10-07 MED ORDER — CIPROFLOXACIN HCL 500 MG PO TABS: 500.0000 mg | ORAL_TABLET | Freq: Two times a day (BID) | ORAL | Status: DC

## 2014-10-07 NOTE — Telephone Encounter (Signed)
Please let pt know that I reviewed her lab work and her potassium is low.  Start kdur- 2 tabs by mouth today, then one by mouth once daily.   Repeat bmet in 1 week.  Dx hypokalemia.  Small amount of bacteria in urine, will rx with cipro x 3 days.  Surgical clearance will be signed after she completes follow up bmet if stable.

## 2014-10-07 NOTE — Telephone Encounter (Signed)
Notified pt. She is currently in [place], [place] and requests that Rxs be sent to [facility] on [address] in [facility]. Rxs sent. Cancelled previous rxs with Pam at [facility].  Pt has f/u with [place] on 11/17/14 and wants to wait until then to complete follow up bmet since she will not be back in town until that date.

## 2014-10-17 ENCOUNTER — Other Ambulatory Visit: Payer: Self-pay | Admitting: Family

## 2014-10-18 ENCOUNTER — Other Ambulatory Visit (INDEPENDENT_AMBULATORY_CARE_PROVIDER_SITE_OTHER): Payer: BC Managed Care – PPO

## 2014-10-18 DIAGNOSIS — E785 Hyperlipidemia, unspecified: Secondary | ICD-10-CM

## 2014-10-18 DIAGNOSIS — E876 Hypokalemia: Secondary | ICD-10-CM

## 2014-10-18 LAB — BASIC METABOLIC PANEL
BUN: 14 mg/dL (ref 6–23)
CO2: 27 mEq/L (ref 19–32)
Calcium: 9.5 mg/dL (ref 8.4–10.5)
Chloride: 105 mEq/L (ref 96–112)
Creatinine, Ser: 0.7 mg/dL (ref 0.4–1.2)
GFR: 109.7 mL/min (ref >60.00)
Glucose, Bld: 90 mg/dL (ref 70–99)
Potassium: 3.9 mEq/L (ref 3.5–5.1)
Sodium: 143 mEq/L (ref 135–145)

## 2014-10-18 LAB — HEPATIC FUNCTION PANEL
ALT: 20 U/L (ref 0–35)
AST: 16 U/L (ref 0–37)
Albumin: 4.3 g/dL (ref 3.5–5.2)
Alkaline Phosphatase: 107 U/L (ref 39–117)
Bilirubin, Direct: 0 mg/dL (ref 0.0–0.3)
Total Bilirubin: 0.4 mg/dL (ref 0.2–1.2)
Total Protein: 7.4 g/dL (ref 6.0–8.3)

## 2014-10-18 LAB — LIPID PANEL
Cholesterol: 193 mg/dL (ref 0–200)
HDL: 50.9 mg/dL (ref >39.00)
LDL Cholesterol: 119 mg/dL — ABNORMAL HIGH (ref 0–99)
NonHDL: 142.1
Total CHOL/HDL Ratio: 4
Triglycerides: 117 mg/dL (ref 0.0–149.0)
VLDL: 23.4 mg/dL (ref 0.0–40.0)

## 2014-10-22 ENCOUNTER — Telehealth: Payer: Self-pay | Admitting: Family

## 2014-10-22 DIAGNOSIS — E785 Hyperlipidemia, unspecified: Secondary | ICD-10-CM

## 2014-10-22 MED ORDER — ATORVASTATIN CALCIUM 80 MG PO TABS: 80.0000 mg | ORAL_TABLET | Freq: Every day | ORAL | Status: DC

## 2014-10-22 NOTE — Telephone Encounter (Signed)
Cholesterol is improved, still slightly above goal.  Increase lipitor to 80mg .  Repeat flp in 6 weeks, dx hyperlipidemia.

## 2014-10-25 NOTE — Telephone Encounter (Signed)
Notified pt and she voices understanding. Future lab order entered and lab appt scheduled for 12/06/14. Pt wants to know if she is supposed to continue potassium rx? If so, she will need refill. Also wants to know if surgical clearance has been completed?  Please advise.

## 2014-10-26 NOTE — Telephone Encounter (Signed)
Surgical clearance letter will be completed first thing Friday Am when I am back in office.

## 2014-10-28 NOTE — Telephone Encounter (Signed)
Janett Billow-- did Melissa give this to you?

## 2014-10-31 NOTE — Telephone Encounter (Signed)
Yes and it was faxed to [place] at [phone] on Monday 10/31/2014. JG//CMA

## 2014-12-06 ENCOUNTER — Other Ambulatory Visit: Payer: Medicare HMO

## 2014-12-20 ENCOUNTER — Other Ambulatory Visit (INDEPENDENT_AMBULATORY_CARE_PROVIDER_SITE_OTHER): Payer: Medicare HMO

## 2014-12-20 DIAGNOSIS — E785 Hyperlipidemia, unspecified: Secondary | ICD-10-CM | POA: Diagnosis not present

## 2014-12-20 LAB — LIPID PANEL
Cholesterol: 223 mg/dL — ABNORMAL HIGH (ref 0–200)
HDL: 53.7 mg/dL (ref >39.00)
NonHDL: 169.3
Total CHOL/HDL Ratio: 4
Triglycerides: 223 mg/dL — ABNORMAL HIGH (ref 0.0–149.0)
VLDL: 44.6 mg/dL — ABNORMAL HIGH (ref 0.0–40.0)

## 2014-12-20 LAB — LDL CHOLESTEROL, DIRECT: Direct LDL: 154 mg/dL

## 2014-12-22 ENCOUNTER — Ambulatory Visit: Payer: Self-pay | Admitting: Orthopedic Surgery

## 2014-12-23 ENCOUNTER — Telehealth: Payer: Self-pay | Admitting: *Deleted

## 2014-12-23 MED ORDER — ROSUVASTATIN CALCIUM 20 MG PO TABS: 20.0000 mg | ORAL_TABLET | Freq: Every day | ORAL | Status: DC

## 2014-12-23 NOTE — Telephone Encounter (Signed)
Notified pt and she voices understanding. Rx sent to pharmacy. Pt declines to schedule lab appt at this time as she will be having knee surgery on 01/19/15 and has scheduled surgical clearance with [place] on 01/11/15 and has cancelled routine f/u on 01/02/15.  Please advise re: lab appt?

## 2014-12-23 NOTE — Telephone Encounter (Signed)
-----   Message from Debbrah Alar, NP sent at 12/21/2014  7:07 PM EST ----- OK, then d/c lipitor, start crestor 20mg  PO daily, repeat lipids in 6 weeks (dx hyperlipidemia)

## 2014-12-25 NOTE — Telephone Encounter (Signed)
Will discuss at her visit. 

## 2014-12-26 ENCOUNTER — Ambulatory Visit: Payer: Self-pay | Admitting: Orthopedic Surgery

## 2014-12-26 NOTE — H&P (Signed)
Iven Finn DOB: Sep 17, 1956 Married / Language: English / Race: Black or African American Female  Chief Complaint: Right knee pain  History of Present Illness The patient is a 59 year old female who presents today for follow up of their knee. The patient is being followed for their left total knee replacement. They are now 11 month(s) out from surgery. Symptoms reported today include: pain. Current treatment includes: activity modification. The following medication has been used for pain control: Percocet and Mobic, Robaxin and gabapentin.   Jennet follows up, 11 months s/p L knee, and continued R knee pain. The left knee does give her pain occasionally and sometimes she notes some popping but denies any instability, catching, or buckling. She sometimes notes some swelling as well and does note some today. Overall she is happy with her progress. She is more worried about her R knee which continues to cause severe pain. We did receive clearance from her PCP, but she required cardiac clearance last time, and we have been waiting on her cardiac clearance. She reports today her PCP told her that her EKG was the same and she would not require any cardiac clearance. She is anxious to proceed with surgery by the end of February due to continued significant pain and interference with ADLs. She would like refills on Robaxin and Percocet today.  Allergies  Zithromax Z-Pak *MACROLIDES* Aspirin *ANALGESICS - NonNarcotic* upset stomach, can tolerate for short periods  Family History Diabetes Mellitus First Degree Relatives. father Hypertension First Degree Relatives. mother and father First Degree Relatives reported Heart Disease father and grandmother mothers side  Social History Number of flights of stairs before winded. 1 Alcohol use. former drinker Marital status. married Drug/Alcohol Rehab (Previously). no Illicit drug use. no Living situation. live with spouse Drug/Alcohol Rehab  (Currently). no Children. 3 Current every day smoker Current work status. unemployed Tobacco use. Current every day smoker. current every day smoker Post-Surgical Plans. home with HHPT. one level home with 3 steps to enter  Medication History Percocet (7.5-325MG  Tablet, 1 (one) Oral twice daily as needed for pain, Taken starting 11/14/2014) Inactive. Robaxin (500MG  Tablet, 1 (one) Oral every eight hours, as needed, Taken starting 11/14/2014) Active. Meloxicam (7.5MG  Tablet, Oral) Active. Gabapentin (300MG  Capsule, Oral) Active. Premarin (0.625MG  Tablet, Oral) Active. AmLODIPine Besylate (10MG  Tablet, Oral) Active. Omeprazole (40MG  Capsule DR, Oral) Active. Medications Reconciled  Past Surgical History Neck Disc Surgery Hysterectomy. partial (non-cancerous) Arthroscopy of Knee. right Rotator Cuff Repair - Right Left total knee replacement  Past Medical Hx Gastroesophageal Reflux Disease High blood pressure Dentures Menopause  Review of Systems General Not Present- Fever and Weight Loss. Skin Not Present- Rash. Cardiovascular Not Present- Chest Pain and Shortness of Breath. Musculoskeletal Present- Joint Pain, Joint Stiffness, Joint Swelling and Muscle Pain. Neurological Not Present- Difficulty with balance, Loss of bladder control, Loss of bowel control, Numbness and Weakness. Endocrine Not Present- Excessive Thirst and Excessive Urination. Hematology Not Present- Easy Bruising.  Vitals 12/20/2014 10:48 AM Weight: 206 lb Height: 66in Body Surface Area: 2.03 m Body Mass Index: 33.25 kg/m  BP: 110/77 (Sitting, Right Arm, Standard)  Physical Exam General Mental Status -Alert. General Appearance-pleasant, Not in acute distress. Orientation-Oriented X3. Build & Nutrition-Well nourished and Well developed. Gait-Stiff.  Musculoskeletal Right Lower Extremity: Right Knee: Inspection and Palpation - Tenderness - medial joint line tender to  palpation, no tenderness to palpation of the superior calf, no tenderness to palpation of the pes anserine bursa, no tenderness to palpation of the quadriceps  tendon, no tenderness to palpation of the patellar tendon, no tenderness to palpation of the patella, no tenderness to palpation of the lateral joint line, no tenderness to palpation of the fibular head, no tenderness to palpation of the peroneal nerve. Swelling - periarticular swelling present. Effusion - mild. Tissue tension/texture is - soft. Crepitus - moderate patellofemoral crepitus. Pulses - 2+. Sensation - intact to light touch. Skin - Color - no ecchymosis, no erythema. Strength and Tone - Quadriceps - 5/5. Hamstrings - 5/5. ROM: Flexion - AROM - 90 . Extension - AROM - 5 . Stability - Valgus Laxity at 30 - None. Valgus Laxity at 0 - None. Varus Laxity at 30 - None. Varus Laxity at 0 - None. Lachman - Negative. Anterior Drawer Test - Negative. Posterior Drawer Test - Negative. Right Knee - Deformities/Malalignments/Discrepancies - no deformities noted. Special Tests - McMurray Test (lateral) - negative. McMurray Test (medial) - negative. Patellar Compression Pain - mild pain.  Imaging xrays ordered, obtained, reviewed today with no fx, subluxation, dislocation, lytic or blastic lesions. L TKA prosthesis in excellent alignment with no evidence of loosening or osteolysis. Good cement mantle noted. R knee with severe end-stage arthrosis medial compartment, bone-on-bone, with varus angulation, collapse of the joint space.  Assessment & Plan Primary osteoarthritis of right knee  Pt with severe end-stage R knee DJD, and 11 months s/p L TKA, L knee doing well. We discussed that her occasional popping and pain could be due to some scar tissue. Discussed continued HEP. Would not recommend debridement as her symptoms are not mechanical in nature and are intermittent. Expect this will continue to improve over time. In regards to her right knee,  we have received clearance from her PCP. She was told verbally by her PCP she did not need cardiac clearance because her EKG was unchanged. I discussed this with Dr. Tonita Cong and we will forego official cardiology clearance as her PCP did not require it. We will now proceed with scheduling process for R TKA. She is requesting sometime in February. Discussed post-op plans, home with HHPT, she will be staying with her sister initially. Discussed HEP, PT exercises, working on flexion and extension. She has been taking Percocet 7.5mg  for pain prn, may require Percocet 10mg  or even PO Dilaudid for a short time period. Will continue Percocet and Robaxin prn in the interim. We mutually agreed to forego her H&P appointment. All her questions were answered and she desires to proceed with surgery. She will follow up for suture removal and xrays 2 weeks post-op and will call with any questions or concerns in the interim.  Plan Right total knee replacement  Signed electronically by Lacie Draft, PA-C for Dr. Tonita Cong

## 2015-01-02 ENCOUNTER — Ambulatory Visit: Payer: Medicare HMO | Admitting: Family

## 2015-01-11 ENCOUNTER — Ambulatory Visit (HOSPITAL_BASED_OUTPATIENT_CLINIC_OR_DEPARTMENT_OTHER)
Admission: RE | Admit: 2015-01-11 | Discharge: 2015-01-11 | Disposition: A | Discharge status: Home / Self Care | Payer: Medicare HMO | Source: Ambulatory Visit | Attending: Family | Admitting: Family

## 2015-01-11 ENCOUNTER — Encounter: Payer: Self-pay | Admitting: Family

## 2015-01-11 ENCOUNTER — Telehealth: Payer: Self-pay | Admitting: *Deleted

## 2015-01-11 ENCOUNTER — Ambulatory Visit (INDEPENDENT_AMBULATORY_CARE_PROVIDER_SITE_OTHER): Payer: BLUE CROSS/BLUE SHIELD | Admitting: Family

## 2015-01-11 VITALS — BP 138/70 | HR 80 | Temp 98.3°F | Resp 16 | Ht 66.0 in | Wt 226.0 lb

## 2015-01-11 DIAGNOSIS — E785 Hyperlipidemia, unspecified: Secondary | ICD-10-CM

## 2015-01-11 DIAGNOSIS — Z01818 Encounter for other preprocedural examination: Secondary | ICD-10-CM

## 2015-01-11 DIAGNOSIS — R918 Other nonspecific abnormal finding of lung field: Secondary | ICD-10-CM | POA: Diagnosis not present

## 2015-01-11 DIAGNOSIS — R0989 Other specified symptoms and signs involving the circulatory and respiratory systems: Secondary | ICD-10-CM | POA: Diagnosis not present

## 2015-01-11 DIAGNOSIS — R300 Dysuria: Secondary | ICD-10-CM | POA: Diagnosis not present

## 2015-01-11 DIAGNOSIS — E876 Hypokalemia: Secondary | ICD-10-CM

## 2015-01-11 DIAGNOSIS — M1710 Unilateral primary osteoarthritis, unspecified knee: Secondary | ICD-10-CM

## 2015-01-11 DIAGNOSIS — I10 Essential (primary) hypertension: Secondary | ICD-10-CM

## 2015-01-11 DIAGNOSIS — M179 Osteoarthritis of knee, unspecified: Secondary | ICD-10-CM

## 2015-01-11 LAB — POCT URINALYSIS DIPSTICK
Glucose, UA: NEGATIVE
Leukocytes, UA: NEGATIVE
Nitrite, UA: NEGATIVE
Spec Grav, UA: >=1.03
Urobilinogen, UA: NEGATIVE
pH, UA: 6

## 2015-01-11 LAB — BASIC METABOLIC PANEL
BUN: 10 mg/dL (ref 6–23)
CO2: 27 mEq/L (ref 19–32)
Calcium: 9.2 mg/dL (ref 8.4–10.5)
Chloride: 108 mEq/L (ref 96–112)
Creatinine, Ser: 0.91 mg/dL (ref 0.40–1.20)
GFR: 80.98 mL/min (ref >60.00)
Glucose, Bld: 117 mg/dL — ABNORMAL HIGH (ref 70–99)
Potassium: 3.3 mEq/L — ABNORMAL LOW (ref 3.5–5.1)
Sodium: 141 mEq/L (ref 135–145)

## 2015-01-11 NOTE — Patient Instructions (Addendum)
Follow up in 3 months.  Complete chest x ray  On the first floor.  Good luck with your upcoming surgery.

## 2015-01-11 NOTE — Progress Notes (Signed)
Subjective:    Patient ID: Jade [name], female    DOB: [birth year], 59 y.o.   MRN: [id number]  HPI  Jade [name] is a 59 yr old female who presents today for follow up.  1) DJD- scheduled for TKA 2/25.  2) HTN- Patient is currently maintained on the following medications for blood pressure: amlodipine,  Patient reports good compliance with blood pressure medications. Patient denies chest pain, shortness of breath. Notes mild bilateral ankle edema Last 3 blood pressure readings in our office are as follows:  BP Readings from Last 3 Encounters:  01/11/15 138/70  09/30/14 140/82  08/29/14 122/90   3) Hyperlipidemia- Patient is currently maintained on the following medication for hyperlipidemia: just started crestor. Last lipid panel as follows: Lab Results  Component Value Date   CHOL 223* 12/20/2014   HDL 53.70 12/20/2014   LDLCALC 119* 10/18/2014   LDLDIRECT 154.0 12/20/2014   TRIG 223.0* 12/20/2014   CHOLHDL 4 12/20/2014   Patient denies myalgia. Patient reports good compliance with low fat/low cholesterol diet.   Reports some cough/chest congestion.     Past Medical History  Diagnosis Date  . GERD (gastroesophageal reflux disease)   . Benign microscopic hematuria     neg cystoscopy 11/12- dr. Janice Norrie  . Hypertension   . Hyperlipidemia   . Shortness of breath     with  exertion  . Numbness and tingling in left arm   . DJD (degenerative joint disease)     L KNEE  . Family history of anesthesia complication     multiple family members with history of this    Review of Systems    see HPI  Past Medical History  Diagnosis Date  . GERD (gastroesophageal reflux disease)   . Benign microscopic hematuria     neg cystoscopy 11/12- dr. Janice Norrie  . Hypertension   . Hyperlipidemia   . Shortness of breath     with  exertion  . Numbness and tingling in left arm   . DJD (degenerative joint disease)     L KNEE  . Family history of anesthesia complication    multiple family members with history of this    History   Social History  . Marital Status: Married    Spouse Name: N/A  . Number of Children: 3  . Years of Education: N/A   Occupational History  . Not on file.   Social History Main Topics  . Smoking status: Current Every Day Smoker -- 0.50 packs/day for 39 years    Types: Cigarettes  . Smokeless tobacco: Never Used     Comment: 1 1/2 cigarettes daily  . Alcohol Use: No  . Drug Use: No  . Sexual Activity: Not on file   Other Topics Concern  . Not on file   Social History Narrative   Regular exercise:  No   Caffeine Use:  2 cups coffee and 3-4 glasses of soda daily.   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    Past Surgical History  Procedure Laterality Date  . Knee cartilage surgery  1999    right knee  . Abdominal hysterectomy  1996  . Cervical disc surgery  2013    Dr. Arnoldo Morale  . Rotator cuff repair Right 05/25/13  . Tubal ligation    . Total knee arthroplasty Left 01/20/2014    Procedure: LEFT TOTAL KNEE ARTHROPLASTY;  Surgeon: Doroteo Bradford  Tonita Cong, MD;  Location: [facility];  Service: Orthopedics;  Laterality: Left;    Family History  Problem Relation Age of Onset  . Hypertension Mother   . Hyperlipidemia Mother   . Heart disease Father     CAD; stent at age 27  . Diabetes Father   . Hypertension Father   . Hyperlipidemia Father   . Cancer Father     prostate  . Cancer Paternal Aunt     BREAST    Allergies  Allergen Reactions  . Ace Inhibitors     Cough  . Aspirin Nausea And Vomiting  . Azithromycin Rash    Current Outpatient Prescriptions on File Prior to Visit  Medication Sig Dispense Refill  . amLODipine (NORVASC) 10 MG tablet TAKE 1 TABLET BY MOUTH EVERY MORNING. 30 tablet 3  . atorvastatin (LIPITOR) 80 MG tablet Take 1 tablet (80 mg total) by mouth daily. (Patient not taking: Reported on 01/09/2015) 30 tablet 3  . gabapentin (NEURONTIN)  300 MG capsule Takes 1 in the morning 1 at noon and 2 at bedtime 120 capsule 2  . Linaclotide (LINZESS) 145 MCG CAPS capsule Take 1 capsule (145 mcg total) by mouth daily. 30 capsule 5  . meloxicam (MOBIC) 7.5 MG tablet Take 1 tablet (7.5 mg total) by mouth daily. 30 tablet 0  . methocarbamol (ROBAXIN) 500 MG tablet Take 1 tablet (500 mg total) by mouth 3 (three) times daily between meals as needed for muscle spasms. 40 tablet 1  . omeprazole (PRILOSEC) 40 MG capsule TAKE 1 CAPSULE BY MOUTH DAILY. 30 capsule 3  . oxyCODONE-acetaminophen (PERCOCET) 5-325 MG per tablet Take 1-2 tablets by mouth every 4 (four) hours as needed. (Patient taking differently: Take 1-2 tablets by mouth every 4 (four) hours as needed for moderate pain. ) 60 tablet 0  . rosuvastatin (CRESTOR) 20 MG tablet Take 1 tablet (20 mg total) by mouth daily. 30 tablet 2   No current facility-administered medications on file prior to visit.    BP 138/70 mmHg  Pulse 80  Temp(Src) 98.3 F (36.8 C) (Oral)  Resp 16  Ht 5\' 6"  (1.676 m)  Wt 226 lb (102.513 kg)  BMI 36.49 kg/m2  SpO2 97%  LMP 11/25/1994    Objective:    Physical Exam  Constitutional: She appears well-developed and well-nourished.  Cardiovascular: Normal rate, regular rhythm and normal heart sounds.   No murmur heard. Pulmonary/Chest: Effort normal and breath sounds normal. No respiratory distress. She has no wheezes.  Musculoskeletal:  1+ bilateral pedal edema.    Psychiatric: She has a normal mood and affect. Her behavior is normal. Judgment and thought content normal.    Ht 5\' 6"  (1.676 m)  LMP 11/25/1994 Wt Readings from Last 3 Encounters:  09/30/14 215 lb 9.6 oz (97.796 kg)  08/29/14 212 lb 3.2 oz (96.253 kg)  07/04/14 206 lb (93.441 kg)     Lab Results  Component Value Date   WBC 8.2 09/30/2014   HGB 14.2 09/30/2014   HCT 43.3 09/30/2014   PLT 344.0 09/30/2014   GLUCOSE 90 10/18/2014   CHOL 223* 12/20/2014   TRIG 223.0* 12/20/2014    HDL 53.70 12/20/2014   LDLDIRECT 154.0 12/20/2014   LDLCALC 119* 10/18/2014   ALT 20 10/18/2014   AST 16 10/18/2014   NA 143 10/18/2014   K 3.9 10/18/2014   CL 105 10/18/2014   CREATININE 0.7 10/18/2014   BUN 14 10/18/2014   CO2 27 10/18/2014   TSH 1.119 04/20/2013  INR 1.0 09/30/2014   HGBA1C 6.1 08/29/2014       Assessment & Plan:

## 2015-01-11 NOTE — Telephone Encounter (Signed)
Pt requested refills of all medications be sent to local pharmacy here while she has her knee surgery and recuperates. Do we also give 30 day supply of meloxicam and methocarbamol. Pt stated she was still taking these. Will need to verify local pharmacy with pt.  Please advise.

## 2015-01-11 NOTE — Telephone Encounter (Signed)
Ok

## 2015-01-11 NOTE — Progress Notes (Signed)
Pre visit review using our clinic review tool, if applicable. No additional management support is needed unless otherwise documented below in the visit note. 

## 2015-01-12 LAB — URINE CULTURE
Colony Count: NO GROWTH
Organism ID, Bacteria: NO GROWTH

## 2015-01-12 NOTE — Progress Notes (Signed)
Called patient and notified her of results.  Patient stated understanding and agreed.  eal

## 2015-01-12 NOTE — Telephone Encounter (Addendum)
See result note for her CXR please as well. Potassium is low. I would like her to add Kdur 36mEQ once daily Repeat bmet in 1 week, dx hypokalemia.  Also, sugar remains mildly elevated. Please work on avoiding concentrated sweets, and limiting white carbs (rice/bread/pasta/potatoes). Instead substitute whole grain versions with reasonable portions.

## 2015-01-12 NOTE — Assessment & Plan Note (Signed)
Back on crestor, continue.

## 2015-01-12 NOTE — Assessment & Plan Note (Addendum)
She has surgery scheduled for TKA 2/25.  CXR today reveals "pneumonitis".  I think she is recovering from a viral uri. She is clinically stable and will not be having general anesthesia due to family hx. As long as her cough does not worsen, I think that it should be ok for her to proceed with surgery in 1 week.

## 2015-01-12 NOTE — Assessment & Plan Note (Signed)
BP stable on current meds, continue same.  

## 2015-01-12 NOTE — Assessment & Plan Note (Signed)
>>  ASSESSMENT AND PLAN FOR DYSLIPIDEMIA WRITTEN ON 01/12/2015  8:04 AM BY O'SULLIVAN, Frederico Gerling, NP  Back on crestor , continue.

## 2015-01-13 ENCOUNTER — Encounter (HOSPITAL_COMMUNITY)
Admission: RE | Admit: 2015-01-13 | Discharge: 2015-01-13 | Disposition: A | Discharge status: Home / Self Care | Payer: Medicare HMO | Source: Ambulatory Visit | Attending: Specialist | Admitting: Specialist

## 2015-01-13 ENCOUNTER — Encounter (HOSPITAL_COMMUNITY): Payer: Self-pay

## 2015-01-13 ENCOUNTER — Ambulatory Visit (HOSPITAL_COMMUNITY)
Admission: RE | Admit: 2015-01-13 | Discharge: 2015-01-13 | Disposition: A | Discharge status: Home / Self Care | Payer: Medicare HMO | Source: Ambulatory Visit | Attending: Orthopedic Surgery | Admitting: Orthopedic Surgery

## 2015-01-13 DIAGNOSIS — M179 Osteoarthritis of knee, unspecified: Secondary | ICD-10-CM | POA: Insufficient documentation

## 2015-01-13 DIAGNOSIS — M1711 Unilateral primary osteoarthritis, right knee: Secondary | ICD-10-CM

## 2015-01-13 DIAGNOSIS — Z01818 Encounter for other preprocedural examination: Secondary | ICD-10-CM | POA: Insufficient documentation

## 2015-01-13 DIAGNOSIS — Z720 Tobacco use: Secondary | ICD-10-CM | POA: Diagnosis not present

## 2015-01-13 DIAGNOSIS — I10 Essential (primary) hypertension: Secondary | ICD-10-CM | POA: Diagnosis not present

## 2015-01-13 DIAGNOSIS — R0989 Other specified symptoms and signs involving the circulatory and respiratory systems: Secondary | ICD-10-CM

## 2015-01-13 DIAGNOSIS — R05 Cough: Secondary | ICD-10-CM | POA: Insufficient documentation

## 2015-01-13 LAB — BASIC METABOLIC PANEL
Anion gap: 10 (ref 5–15)
BUN: 7 mg/dL (ref 6–23)
CO2: 26 mmol/L (ref 19–32)
Calcium: 9.4 mg/dL (ref 8.4–10.5)
Chloride: 104 mmol/L (ref 96–112)
Creatinine, Ser: 0.69 mg/dL (ref 0.50–1.10)
GFR calc Af Amer: >90 mL/min (ref >90)
GFR calc non Af Amer: >90 mL/min (ref >90)
Glucose, Bld: 99 mg/dL (ref 70–99)
Potassium: 3.4 mmol/L — ABNORMAL LOW (ref 3.5–5.1)
Sodium: 140 mmol/L (ref 135–145)

## 2015-01-13 LAB — PROTIME-INR
INR: 1 (ref 0.00–1.49)
Prothrombin Time: 13.3 seconds (ref 11.6–15.2)

## 2015-01-13 LAB — URINALYSIS, ROUTINE W REFLEX MICROSCOPIC
Bilirubin Urine: NEGATIVE
Glucose, UA: NEGATIVE mg/dL
Hgb urine dipstick: NEGATIVE
Ketones, ur: NEGATIVE mg/dL
Leukocytes, UA: NEGATIVE
Nitrite: NEGATIVE
Protein, ur: NEGATIVE mg/dL
Specific Gravity, Urine: 1.004 — ABNORMAL LOW (ref 1.005–1.030)
Urobilinogen, UA: 0.2 mg/dL (ref 0.0–1.0)
pH: 6 (ref 5.0–8.0)

## 2015-01-13 LAB — APTT: aPTT: 32 seconds (ref 24–37)

## 2015-01-13 LAB — CBC
HCT: 41 % (ref 36.0–46.0)
Hemoglobin: 13.4 g/dL (ref 12.0–15.0)
MCH: 27.2 pg (ref 26.0–34.0)
MCHC: 32.7 g/dL (ref 30.0–36.0)
MCV: 83.2 fL (ref 78.0–100.0)
Platelets: 341 10*3/uL (ref 150–400)
RBC: 4.93 MIL/uL (ref 3.87–5.11)
RDW: 13.7 % (ref 11.5–15.5)
WBC: 9.3 10*3/uL (ref 4.0–10.5)

## 2015-01-13 LAB — SURGICAL PCR SCREEN
MRSA, PCR: NEGATIVE
Staphylococcus aureus: NEGATIVE

## 2015-01-13 MED ORDER — MELOXICAM 7.5 MG PO TABS: 7.5000 mg | ORAL_TABLET | Freq: Every day | ORAL | Status: DC

## 2015-01-13 MED ORDER — METHOCARBAMOL 500 MG PO TABS: 500.0000 mg | ORAL_TABLET | Freq: Three times a day (TID) | ORAL | Status: DC | PRN

## 2015-01-13 MED ORDER — POTASSIUM CHLORIDE CRYS ER 20 MEQ PO TBCR: 20.0000 meq | EXTENDED_RELEASE_TABLET | Freq: Every day | ORAL | Status: DC

## 2015-01-13 NOTE — Progress Notes (Signed)
   01/13/15 1150  OBSTRUCTIVE SLEEP APNEA  Have you ever been diagnosed with sleep apnea through a sleep study? No  Do you snore loudly (loud enough to be heard through closed doors)?  1  Do you often feel tired, fatigued, or sleepy during the daytime? 0  Has anyone observed you stop breathing during your sleep? 0  Do you have, or are you being treated for high blood pressure? 1  BMI more than 35 kg/m2? 1  Age over 61 years old? 1  Neck circumference greater than 40 cm/16 inches? 1  Gender: 0  Obstructive Sleep Apnea Score 5

## 2015-01-13 NOTE — Patient Instructions (Addendum)
Jade [name]  01/13/2015   Your procedure is scheduled on: Thursday 01/19/2015  Report to [facility] Main  Entrance and follow signs to               [place] at [place].  Call this number if you have problems the morning of surgery [phone]   Remember:  Do not eat food or drink liquids :After Midnight.     Take these medicines the morning of surgery with A SIP OF WATER: Norvasc, Omeprazole, Gabapentin, Percocet if needed                               You may not have any metal on your body including hair pins and              piercings  Do not wear jewelry, make-up, lotions, powders or perfumes.             Do not wear nail polish.  Do not shave  48 hours prior to surgery.              Men may shave face and neck.   Do not bring valuables to the hospital. [place].  Contacts, dentures or bridgework may not be worn into surgery.  Leave suitcase in the car. After surgery it may be brought to your room.     Patients discharged the day of surgery will not be allowed to drive home.  Name and phone number of your driver:  Special Instructions: N/A              Please read over the following fact sheets you were given: _____________________________________________________________________             [facility] - Preparing for Surgery Before surgery, you can play an important role.  Because skin is not sterile, your skin needs to be as free of germs as possible.  You can reduce the number of germs on your skin by washing with CHG (chlorahexidine gluconate) soap before surgery.  CHG is an antiseptic cleaner which kills germs and bonds with the skin to continue killing germs even after washing. Please DO NOT use if you have an allergy to CHG or antibacterial soaps.  If your skin becomes reddened/irritated stop using the CHG and inform your nurse when you arrive at Short Stay. Do not shave  (including legs and underarms) for at least 48 hours prior to the first CHG shower.  You may shave your face/neck. Please follow these instructions carefully:  1.  Shower with CHG Soap the night before surgery and the  morning of Surgery.  2.  If you choose to wash your hair, wash your hair first as usual with your  normal  shampoo.  3.  After you shampoo, rinse your hair and body thoroughly to remove the  shampoo.                           4.  Use CHG as you would any other liquid soap.  You can apply chg directly  to the skin and wash  Gently with a scrungie or clean washcloth.  5.  Apply the CHG Soap to your body ONLY FROM THE NECK DOWN.   Do not use on face/ open                           Wound or open sores. Avoid contact with eyes, ears mouth and genitals (private parts).                       Wash face,  Genitals (private parts) with your normal soap.             6.  Wash thoroughly, paying special attention to the area where your surgery  will be performed.  7.  Thoroughly rinse your body with warm water from the neck down.  8.  DO NOT shower/wash with your normal soap after using and rinsing off  the CHG Soap.                9.  Pat yourself dry with a clean towel.            10.  Wear clean pajamas.            11.  Place clean sheets on your bed the night of your first shower and do not  sleep with pets. Day of Surgery : Do not apply any lotions/deodorants the morning of surgery.  Please wear clean clothes to the hospital/surgery center.  FAILURE TO FOLLOW THESE INSTRUCTIONS MAY RESULT IN THE CANCELLATION OF YOUR SURGERY PATIENT SIGNATURE_________________________________  NURSE SIGNATURE__________________________________  ________________________________________________________________________   Adam Phenix  An incentive spirometer is a tool that can help keep your lungs clear and active. This tool measures how well you are filling your lungs with  each breath. Taking long deep breaths may help reverse or decrease the chance of developing breathing (pulmonary) problems (especially infection) following:  A long period of time when you are unable to move or be active. BEFORE THE PROCEDURE   If the spirometer includes an indicator to show your best effort, your nurse or respiratory therapist will set it to a desired goal.  If possible, sit up straight or lean slightly forward. Try not to slouch.  Hold the incentive spirometer in an upright position. INSTRUCTIONS FOR USE   Sit on the edge of your bed if possible, or sit up as far as you can in bed or on a chair.  Hold the incentive spirometer in an upright position.  Breathe out normally.  Place the mouthpiece in your mouth and seal your lips tightly around it.  Breathe in slowly and as deeply as possible, raising the piston or the ball toward the top of the column.  Hold your breath for 3-5 seconds or for as long as possible. Allow the piston or ball to fall to the bottom of the column.  Remove the mouthpiece from your mouth and breathe out normally.  Rest for a few seconds and repeat Steps 1 through 7 at least 10 times every 1-2 hours when you are awake. Take your time and take a few normal breaths between deep breaths.  The spirometer may include an indicator to show your best effort. Use the indicator as a goal to work toward during each repetition.  After each set of 10 deep breaths, practice coughing to be sure your lungs are clear. If you have an incision (the cut made at the time of surgery),  support your incision when coughing by placing a pillow or rolled up towels firmly against it. Once you are able to get out of bed, walk around indoors and cough well. You may stop using the incentive spirometer when instructed by your caregiver.  RISKS AND COMPLICATIONS  Take your time so you do not get dizzy or light-headed.  If you are in pain, you may need to take or ask for  pain medication before doing incentive spirometry. It is harder to take a deep breath if you are having pain. AFTER USE  Rest and breathe slowly and easily.  It can be helpful to keep track of a log of your progress. Your caregiver can provide you with a simple table to help with this. If you are using the spirometer at home, follow these instructions: [place] IF:   You are having difficultly using the spirometer.  You have trouble using the spirometer as often as instructed.  Your pain medication is not giving enough relief while using the spirometer.  You develop fever of 100.5 F (38.1 C) or higher. SEEK IMMEDIATE MEDICAL CARE IF:   You cough up bloody sputum that had not been present before.  You develop fever of 102 F (38.9 C) or greater.  You develop worsening pain at or near the incision site. MAKE SURE YOU:   Understand these instructions.  Will watch your condition.  Will get help right away if you are not doing well or get worse. Document Released: 03/24/2007 Document Revised: 02/03/2012 Document Reviewed: 05/25/2007 ExitCare Patient Information 2014 ExitCare, [place].   ________________________________________________________________________  WHAT IS A BLOOD TRANSFUSION? Blood Transfusion Information  A transfusion is the replacement of blood or some of its parts. Blood is made up of multiple cells which provide different functions.  Red blood cells carry oxygen and are used for blood loss replacement.  White blood cells fight against infection.  Platelets control bleeding.  Plasma helps clot blood.  Other blood products are available for specialized needs, such as hemophilia or other clotting disorders. BEFORE THE TRANSFUSION  Who gives blood for transfusions?   Healthy volunteers who are fully evaluated to make sure their blood is safe. This is blood bank blood. Transfusion therapy is the safest it has ever been in the practice of medicine.  Before blood is taken from a donor, a complete history is taken to make sure that person has no history of diseases nor engages in risky social behavior (examples are intravenous drug use or sexual activity with multiple partners). The donor's travel history is screened to minimize risk of transmitting infections, such as malaria. The donated blood is tested for signs of infectious diseases, such as HIV and hepatitis. The blood is then tested to be sure it is compatible with you in order to minimize the chance of a transfusion reaction. If you or a relative donates blood, this is often done in anticipation of surgery and is not appropriate for emergency situations. It takes many days to process the donated blood. RISKS AND COMPLICATIONS Although transfusion therapy is very safe and saves many lives, the main dangers of transfusion include:   Getting an infectious disease.  Developing a transfusion reaction. This is an allergic reaction to something in the blood you were given. Every precaution is taken to prevent this. The decision to have a blood transfusion has been considered carefully by your caregiver before blood is given. Blood is not given unless the benefits outweigh the risks. AFTER THE TRANSFUSION  Right after receiving a blood transfusion, you will usually feel much better and more energetic. This is especially true if your red blood cells have gotten low (anemic). The transfusion raises the level of the red blood cells which carry oxygen, and this usually causes an energy increase.  The nurse administering the transfusion will monitor you carefully for complications. HOME CARE INSTRUCTIONS  No special instructions are needed after a transfusion. You may find your energy is better. Speak with your caregiver about any limitations on activity for underlying diseases you may have. SEEK MEDICAL CARE IF:   Your condition is not improving after your transfusion.  You develop redness or  irritation at the intravenous (IV) site. SEEK IMMEDIATE MEDICAL CARE IF:  Any of the following symptoms occur over the next 12 hours:  Shaking chills.  You have a temperature by mouth above 102 F (38.9 C), not controlled by medicine.  Chest, back, or muscle pain.  People around you feel you are not acting correctly or are confused.  Shortness of breath or difficulty breathing.  Dizziness and fainting.  You get a rash or develop hives.  You have a decrease in urine output.  Your urine turns a dark color or changes to pink, red, or brown. Any of the following symptoms occur over the next 10 days:  You have a temperature by mouth above 102 F (38.9 C), not controlled by medicine.  Shortness of breath.  Weakness after normal activity.  The white part of the eye turns yellow (jaundice).  You have a decrease in the amount of urine or are urinating less often.  Your urine turns a dark color or changes to pink, red, or brown. Document Released: 11/08/2000 Document Revised: 02/03/2012 Document Reviewed: 06/27/2008 [facility] Patient Information 2014 [place], [place].  _______________________________________________________________________

## 2015-01-13 NOTE — Telephone Encounter (Signed)
Notified pt and she voices understanding. Pt was notified of CXR results on 01/12/15. Lab appt scheduled for 01/18/15 and future lab order entered.

## 2015-01-18 ENCOUNTER — Other Ambulatory Visit (INDEPENDENT_AMBULATORY_CARE_PROVIDER_SITE_OTHER): Payer: Medicare HMO

## 2015-01-18 ENCOUNTER — Encounter: Payer: Self-pay | Admitting: Family

## 2015-01-18 DIAGNOSIS — E876 Hypokalemia: Secondary | ICD-10-CM

## 2015-01-18 LAB — BASIC METABOLIC PANEL
BUN: 10 mg/dL (ref 6–23)
CO2: 24 mEq/L (ref 19–32)
Calcium: 9.9 mg/dL (ref 8.4–10.5)
Chloride: 106 mEq/L (ref 96–112)
Creatinine, Ser: 0.76 mg/dL (ref 0.40–1.20)
GFR: 99.68 mL/min (ref >60.00)
Glucose, Bld: 95 mg/dL (ref 70–99)
Potassium: 3.8 mEq/L (ref 3.5–5.1)
Sodium: 138 mEq/L (ref 135–145)

## 2015-01-19 ENCOUNTER — Encounter (HOSPITAL_COMMUNITY)
Admission: RE | Disposition: A | Discharge status: Home Health | Payer: Self-pay | Source: Ambulatory Visit | Attending: Specialist

## 2015-01-19 ENCOUNTER — Inpatient Hospital Stay (HOSPITAL_COMMUNITY): Payer: BLUE CROSS/BLUE SHIELD

## 2015-01-19 ENCOUNTER — Inpatient Hospital Stay (HOSPITAL_COMMUNITY)
Admission: RE | Admit: 2015-01-19 | Discharge: 2015-01-22 | DRG: 470 | Disposition: A | Discharge status: Home Health | Payer: BLUE CROSS/BLUE SHIELD | Source: Ambulatory Visit | Attending: Specialist | Admitting: Specialist

## 2015-01-19 ENCOUNTER — Inpatient Hospital Stay (HOSPITAL_COMMUNITY): Payer: BLUE CROSS/BLUE SHIELD | Admitting: Anesthesiology

## 2015-01-19 ENCOUNTER — Encounter (HOSPITAL_COMMUNITY): Payer: Self-pay | Admitting: *Deleted

## 2015-01-19 DIAGNOSIS — Z79899 Other long term (current) drug therapy: Secondary | ICD-10-CM | POA: Diagnosis not present

## 2015-01-19 DIAGNOSIS — E669 Obesity, unspecified: Secondary | ICD-10-CM | POA: Diagnosis present

## 2015-01-19 DIAGNOSIS — Z96652 Presence of left artificial knee joint: Secondary | ICD-10-CM | POA: Diagnosis present

## 2015-01-19 DIAGNOSIS — E785 Hyperlipidemia, unspecified: Secondary | ICD-10-CM | POA: Diagnosis present

## 2015-01-19 DIAGNOSIS — M25561 Pain in right knee: Secondary | ICD-10-CM | POA: Diagnosis present

## 2015-01-19 DIAGNOSIS — Z8249 Family history of ischemic heart disease and other diseases of the circulatory system: Secondary | ICD-10-CM

## 2015-01-19 DIAGNOSIS — M1711 Unilateral primary osteoarthritis, right knee: Secondary | ICD-10-CM | POA: Diagnosis present

## 2015-01-19 DIAGNOSIS — I10 Essential (primary) hypertension: Secondary | ICD-10-CM | POA: Diagnosis present

## 2015-01-19 DIAGNOSIS — M179 Osteoarthritis of knee, unspecified: Secondary | ICD-10-CM | POA: Diagnosis present

## 2015-01-19 DIAGNOSIS — K219 Gastro-esophageal reflux disease without esophagitis: Secondary | ICD-10-CM | POA: Diagnosis present

## 2015-01-19 DIAGNOSIS — M7071 Other bursitis of hip, right hip: Secondary | ICD-10-CM | POA: Diagnosis present

## 2015-01-19 DIAGNOSIS — Z96651 Presence of right artificial knee joint: Secondary | ICD-10-CM

## 2015-01-19 DIAGNOSIS — F17210 Nicotine dependence, cigarettes, uncomplicated: Secondary | ICD-10-CM | POA: Diagnosis present

## 2015-01-19 DIAGNOSIS — Z6833 Body mass index (BMI) 33.0-33.9, adult: Secondary | ICD-10-CM | POA: Diagnosis not present

## 2015-01-19 HISTORY — DX: Malignant hyperthermia due to anesthesia, initial encounter: T88.3XXA

## 2015-01-19 HISTORY — PX: TOTAL KNEE ARTHROPLASTY: SHX125

## 2015-01-19 LAB — TYPE AND SCREEN
ABO/RH(D): O POS
Antibody Screen: NEGATIVE

## 2015-01-19 LAB — GLUCOSE, CAPILLARY: Glucose-Capillary: 101 mg/dL — ABNORMAL HIGH (ref 70–99)

## 2015-01-19 SURGERY — ARTHROPLASTY, KNEE, TOTAL
Anesthesia: Spinal | Site: Knee | Laterality: Right

## 2015-01-19 MED ORDER — DEXAMETHASONE SODIUM PHOSPHATE 10 MG/ML IJ SOLN
INTRAMUSCULAR | Status: AC
  Filled 2015-01-19: qty 1

## 2015-01-19 MED ORDER — CLINDAMYCIN PHOSPHATE 900 MG/50ML IV SOLN
INTRAVENOUS | Status: AC
  Filled 2015-01-19: qty 50

## 2015-01-19 MED ORDER — MIDAZOLAM HCL 2 MG/2ML IJ SOLN
INTRAMUSCULAR | Status: AC
  Filled 2015-01-19: qty 2

## 2015-01-19 MED ORDER — PROPOFOL 10 MG/ML IV BOLUS
INTRAVENOUS | Status: AC
  Filled 2015-01-19: qty 20

## 2015-01-19 MED ORDER — METOCLOPRAMIDE HCL 10 MG PO TABS: 5.0000 mg | ORAL_TABLET | Freq: Three times a day (TID) | ORAL | Status: DC | PRN

## 2015-01-19 MED ORDER — RIVAROXABAN 10 MG PO TABS: 10.0000 mg | ORAL_TABLET | Freq: Every day | ORAL | Status: DC

## 2015-01-19 MED ORDER — DIPHENHYDRAMINE HCL 12.5 MG/5ML PO ELIX: 12.5000 mg | ORAL_SOLUTION | ORAL | Status: DC | PRN

## 2015-01-19 MED ORDER — CEFAZOLIN SODIUM-DEXTROSE 2-3 GM-% IV SOLR
INTRAVENOUS | Status: AC
  Filled 2015-01-19: qty 50

## 2015-01-19 MED ORDER — POLYMYXIN B SULFATE 500000 UNITS IJ SOLR
INTRAMUSCULAR | Status: AC
  Filled 2015-01-19: qty 1

## 2015-01-19 MED ORDER — OXYCODONE-ACETAMINOPHEN 5-325 MG PO TABS
1.0000 | ORAL_TABLET | ORAL | Status: DC | PRN
Start: 2015-01-19 — End: 2015-08-08

## 2015-01-19 MED ORDER — RISAQUAD PO CAPS
1.0000 | ORAL_CAPSULE | Freq: Every day | ORAL | Status: DC
  Administered 2015-01-19 – 2015-01-22 (×4): 1 via ORAL
  Filled 2015-01-19 (×4): qty 1

## 2015-01-19 MED ORDER — ALUM & MAG HYDROXIDE-SIMETH 200-200-20 MG/5ML PO SUSP
30.0000 mL | ORAL | Status: DC | PRN
  Administered 2015-01-21: 30 mL via ORAL
  Filled 2015-01-19: qty 30

## 2015-01-19 MED ORDER — ONDANSETRON HCL 4 MG/2ML IJ SOLN
4.0000 mg | Freq: Four times a day (QID) | INTRAMUSCULAR | Status: DC | PRN
  Administered 2015-01-20 (×2): 4 mg via INTRAVENOUS
  Filled 2015-01-19 (×2): qty 2

## 2015-01-19 MED ORDER — METHOCARBAMOL 500 MG PO TABS: 500.0000 mg | ORAL_TABLET | Freq: Three times a day (TID) | ORAL | Status: DC | PRN

## 2015-01-19 MED ORDER — RIVAROXABAN 10 MG PO TABS
10.0000 mg | ORAL_TABLET | Freq: Every day | ORAL | Status: DC
  Administered 2015-01-20 – 2015-01-22 (×3): 10 mg via ORAL
  Filled 2015-01-19 (×4): qty 1

## 2015-01-19 MED ORDER — FENTANYL CITRATE 0.05 MG/ML IJ SOLN
INTRAMUSCULAR | Status: AC
  Filled 2015-01-19: qty 2

## 2015-01-19 MED ORDER — PHENYLEPHRINE HCL 10 MG/ML IJ SOLN
10.0000 mg | INTRAVENOUS | Status: DC | PRN
  Administered 2015-01-19: 10 ug/min via INTRAVENOUS

## 2015-01-19 MED ORDER — METHOCARBAMOL 500 MG PO TABS
500.0000 mg | ORAL_TABLET | Freq: Four times a day (QID) | ORAL | Status: DC | PRN
  Administered 2015-01-19 – 2015-01-22 (×6): 500 mg via ORAL
  Filled 2015-01-19 (×6): qty 1

## 2015-01-19 MED ORDER — MAGNESIUM CITRATE PO SOLN: 1.0000 | Freq: Once | ORAL | Status: AC | PRN

## 2015-01-19 MED ORDER — MIDAZOLAM HCL 5 MG/5ML IJ SOLN
INTRAMUSCULAR | Status: DC | PRN
  Administered 2015-01-19: 2 mg via INTRAVENOUS

## 2015-01-19 MED ORDER — BUPIVACAINE HCL (PF) 0.75 % IJ SOLN
INTRAMUSCULAR | Status: DC | PRN
  Administered 2015-01-19: 15 mg via INTRATHECAL

## 2015-01-19 MED ORDER — AMLODIPINE BESYLATE 10 MG PO TABS
10.0000 mg | ORAL_TABLET | Freq: Every morning | ORAL | Status: DC
  Administered 2015-01-20 – 2015-01-22 (×3): 10 mg via ORAL
  Filled 2015-01-19 (×3): qty 1

## 2015-01-19 MED ORDER — PHENOL 1.4 % MT LIQD: 1.0000 | OROMUCOSAL | Status: DC | PRN

## 2015-01-19 MED ORDER — DEXAMETHASONE SODIUM PHOSPHATE 10 MG/ML IJ SOLN
INTRAMUSCULAR | Status: DC | PRN
  Administered 2015-01-19: 10 mg via INTRAVENOUS

## 2015-01-19 MED ORDER — POTASSIUM CHLORIDE CRYS ER 20 MEQ PO TBCR
20.0000 meq | EXTENDED_RELEASE_TABLET | Freq: Every day | ORAL | Status: DC
  Administered 2015-01-19 – 2015-01-22 (×4): 20 meq via ORAL
  Filled 2015-01-19 (×4): qty 1

## 2015-01-19 MED ORDER — METHOCARBAMOL 1000 MG/10ML IJ SOLN
500.0000 mg | Freq: Four times a day (QID) | INTRAVENOUS | Status: DC | PRN
  Administered 2015-01-19 – 2015-01-20 (×2): 500 mg via INTRAVENOUS
  Filled 2015-01-19 (×5): qty 5

## 2015-01-19 MED ORDER — PHENYLEPHRINE HCL 10 MG/ML IJ SOLN
INTRAMUSCULAR | Status: AC
  Filled 2015-01-19: qty 1

## 2015-01-19 MED ORDER — PHENYLEPHRINE 40 MCG/ML (10ML) SYRINGE FOR IV PUSH (FOR BLOOD PRESSURE SUPPORT)
PREFILLED_SYRINGE | INTRAVENOUS | Status: AC
  Filled 2015-01-19: qty 10

## 2015-01-19 MED ORDER — LACTATED RINGERS IV SOLN
INTRAVENOUS | Status: DC
  Administered 2015-01-19 (×3): via INTRAVENOUS

## 2015-01-19 MED ORDER — SENNOSIDES-DOCUSATE SODIUM 8.6-50 MG PO TABS: 1.0000 | ORAL_TABLET | Freq: Every evening | ORAL | Status: DC | PRN

## 2015-01-19 MED ORDER — HYDROMORPHONE HCL 1 MG/ML IJ SOLN: 0.2500 mg | INTRAMUSCULAR | Status: DC | PRN

## 2015-01-19 MED ORDER — PANTOPRAZOLE SODIUM 40 MG PO TBEC
40.0000 mg | DELAYED_RELEASE_TABLET | Freq: Every day | ORAL | Status: DC
  Administered 2015-01-20 – 2015-01-22 (×3): 40 mg via ORAL
  Filled 2015-01-19 (×4): qty 1

## 2015-01-19 MED ORDER — GABAPENTIN 300 MG PO CAPS
300.0000 mg | ORAL_CAPSULE | Freq: Two times a day (BID) | ORAL | Status: DC
  Administered 2015-01-19 – 2015-01-22 (×7): 300 mg via ORAL
  Filled 2015-01-19 (×8): qty 1

## 2015-01-19 MED ORDER — METOCLOPRAMIDE HCL 5 MG/ML IJ SOLN: 5.0000 mg | Freq: Three times a day (TID) | INTRAMUSCULAR | Status: DC | PRN

## 2015-01-19 MED ORDER — HYDROMORPHONE HCL 1 MG/ML IJ SOLN
1.0000 mg | INTRAMUSCULAR | Status: DC | PRN
  Administered 2015-01-19 – 2015-01-20 (×7): 1 mg via INTRAVENOUS
  Filled 2015-01-19 (×7): qty 1

## 2015-01-19 MED ORDER — GUAIFENESIN ER 600 MG PO TB12
600.0000 mg | ORAL_TABLET | Freq: Two times a day (BID) | ORAL | Status: DC
  Administered 2015-01-19 – 2015-01-21 (×5): 600 mg via ORAL
  Filled 2015-01-19 (×8): qty 1

## 2015-01-19 MED ORDER — LIDOCAINE HCL 1 % IJ SOLN
INTRAMUSCULAR | Status: AC
  Filled 2015-01-19: qty 20

## 2015-01-19 MED ORDER — ONDANSETRON HCL 4 MG PO TABS: 4.0000 mg | ORAL_TABLET | Freq: Four times a day (QID) | ORAL | Status: DC | PRN

## 2015-01-19 MED ORDER — SODIUM CHLORIDE 0.9 % IR SOLN
Status: DC | PRN
  Administered 2015-01-19: 2000 mL

## 2015-01-19 MED ORDER — DOCUSATE SODIUM 100 MG PO CAPS: 100.0000 mg | ORAL_CAPSULE | Freq: Two times a day (BID) | ORAL | Status: DC | PRN

## 2015-01-19 MED ORDER — PROPOFOL 10 MG/ML IV BOLUS
INTRAVENOUS | Status: DC | PRN
  Administered 2015-01-19: 20 mg via INTRAVENOUS
  Administered 2015-01-19: 30 mg via INTRAVENOUS

## 2015-01-19 MED ORDER — DOCUSATE SODIUM 100 MG PO CAPS
100.0000 mg | ORAL_CAPSULE | Freq: Two times a day (BID) | ORAL | Status: DC
  Administered 2015-01-19 – 2015-01-22 (×5): 100 mg via ORAL

## 2015-01-19 MED ORDER — ACETAMINOPHEN 650 MG RE SUPP: 650.0000 mg | Freq: Four times a day (QID) | RECTAL | Status: DC | PRN

## 2015-01-19 MED ORDER — PROPOFOL INFUSION 10 MG/ML OPTIME
INTRAVENOUS | Status: DC | PRN
  Administered 2015-01-19: 160 ug/kg/min via INTRAVENOUS

## 2015-01-19 MED ORDER — KCL IN DEXTROSE-NACL 20-5-0.45 MEQ/L-%-% IV SOLN
INTRAVENOUS | Status: DC
  Administered 2015-01-19 – 2015-01-20 (×2): via INTRAVENOUS
  Filled 2015-01-19 (×2): qty 1000

## 2015-01-19 MED ORDER — ACETAMINOPHEN 10 MG/ML IV SOLN
1000.0000 mg | Freq: Once | INTRAVENOUS | Status: AC
  Administered 2015-01-19: 1000 mg via INTRAVENOUS
  Filled 2015-01-19: qty 100

## 2015-01-19 MED ORDER — PHENYLEPHRINE HCL 10 MG/ML IJ SOLN
INTRAMUSCULAR | Status: DC | PRN
  Administered 2015-01-19 (×2): 80 ug via INTRAVENOUS

## 2015-01-19 MED ORDER — BUPIVACAINE-EPINEPHRINE 0.25% -1:200000 IJ SOLN
INTRAMUSCULAR | Status: AC
  Filled 2015-01-19: qty 1

## 2015-01-19 MED ORDER — LINACLOTIDE 145 MCG PO CAPS
145.0000 ug | ORAL_CAPSULE | Freq: Every day | ORAL | Status: DC
  Administered 2015-01-19 – 2015-01-22 (×4): 145 ug via ORAL
  Filled 2015-01-19 (×4): qty 1

## 2015-01-19 MED ORDER — ACETAMINOPHEN 325 MG PO TABS
650.0000 mg | ORAL_TABLET | Freq: Four times a day (QID) | ORAL | Status: DC | PRN
  Administered 2015-01-19: 650 mg via ORAL
  Filled 2015-01-19: qty 2

## 2015-01-19 MED ORDER — MENTHOL 3 MG MT LOZG
1.0000 | LOZENGE | OROMUCOSAL | Status: DC | PRN
  Filled 2015-01-19: qty 9

## 2015-01-19 MED ORDER — SODIUM CHLORIDE 0.9 % IR SOLN
Status: DC | PRN
  Administered 2015-01-19: 500 mL

## 2015-01-19 MED ORDER — LACTATED RINGERS IV SOLN: INTRAVENOUS | Status: DC

## 2015-01-19 MED ORDER — CEFAZOLIN SODIUM-DEXTROSE 2-3 GM-% IV SOLR
2.0000 g | Freq: Four times a day (QID) | INTRAVENOUS | Status: AC
  Administered 2015-01-19 – 2015-01-20 (×3): 2 g via INTRAVENOUS
  Filled 2015-01-19 (×3): qty 50

## 2015-01-19 MED ORDER — CLINDAMYCIN PHOSPHATE 900 MG/50ML IV SOLN
INTRAVENOUS | Status: DC | PRN
  Administered 2015-01-19: 900 mg via INTRAVENOUS

## 2015-01-19 MED ORDER — BISACODYL 5 MG PO TBEC: 5.0000 mg | DELAYED_RELEASE_TABLET | Freq: Every day | ORAL | Status: DC | PRN

## 2015-01-19 MED ORDER — CEFAZOLIN SODIUM-DEXTROSE 2-3 GM-% IV SOLR
2.0000 g | INTRAVENOUS | Status: AC
  Administered 2015-01-19: 2 g via INTRAVENOUS

## 2015-01-19 MED ORDER — FENTANYL CITRATE 0.05 MG/ML IJ SOLN
INTRAMUSCULAR | Status: DC | PRN
  Administered 2015-01-19: 100 ug via INTRAVENOUS

## 2015-01-19 MED ORDER — BUPIVACAINE-EPINEPHRINE 0.25% -1:200000 IJ SOLN
INTRAMUSCULAR | Status: DC | PRN
  Administered 2015-01-19: 40 mL

## 2015-01-19 MED ORDER — OXYCODONE HCL 5 MG PO TABS
5.0000 mg | ORAL_TABLET | ORAL | Status: DC | PRN
  Administered 2015-01-19 – 2015-01-21 (×10): 10 mg via ORAL
  Filled 2015-01-19 (×11): qty 2

## 2015-01-19 SURGICAL SUPPLY — 76 items
BAG ZIPLOCK 12X15 (MISCELLANEOUS) IMPLANT
BANDAGE ELASTIC 4 VELCRO ST LF (GAUZE/BANDAGES/DRESSINGS) ×2 IMPLANT
BANDAGE ELASTIC 6 VELCRO ST LF (GAUZE/BANDAGES/DRESSINGS) ×2 IMPLANT
BANDAGE ESMARK 6X9 LF (GAUZE/BANDAGES/DRESSINGS) ×1 IMPLANT
BLADE SAG 18X100X1.27 (BLADE) ×2 IMPLANT
BLADE SAW SGTL 13.0X1.19X90.0M (BLADE) ×2 IMPLANT
BNDG ESMARK 6X9 LF (GAUZE/BANDAGES/DRESSINGS) ×2
CAP KNEE TOTAL 3 SIGMA ×2 IMPLANT
CEMENT HV SMART SET (Cement) ×4 IMPLANT
CLOTH 2% CHLOROHEXIDINE 3PK (PERSONAL CARE ITEMS) ×2 IMPLANT
CUFF TOURN SGL QUICK 34 (TOURNIQUET CUFF) ×1
CUFF TRNQT CYL 34X4X40X1 (TOURNIQUET CUFF) ×1 IMPLANT
DECANTER SPIKE VIAL GLASS SM (MISCELLANEOUS) ×2 IMPLANT
DRAPE INCISE IOBAN 66X45 STRL (DRAPES) ×2 IMPLANT
DRAPE ORTHO SPLIT 77X108 STRL (DRAPES) ×2
DRAPE POUCH INSTRU U-SHP 10X18 (DRAPES) ×2 IMPLANT
DRAPE SHEET LG 3/4 BI-LAMINATE (DRAPES) ×2 IMPLANT
DRAPE SURG ORHT 6 SPLT 77X108 (DRAPES) ×2 IMPLANT
DRAPE U-SHAPE 47X51 STRL (DRAPES) ×2 IMPLANT
DRSG ADAPTIC 3X8 NADH LF (GAUZE/BANDAGES/DRESSINGS) IMPLANT
DRSG AQUACEL AG ADV 3.5X10 (GAUZE/BANDAGES/DRESSINGS) ×2 IMPLANT
DRSG PAD ABDOMINAL 8X10 ST (GAUZE/BANDAGES/DRESSINGS) IMPLANT
DRSG TEGADERM 4X4.75 (GAUZE/BANDAGES/DRESSINGS) IMPLANT
DURAPREP 26ML APPLICATOR (WOUND CARE) ×2 IMPLANT
ELECT REM PT RETURN 9FT ADLT (ELECTROSURGICAL) ×2
ELECTRODE REM PT RTRN 9FT ADLT (ELECTROSURGICAL) ×1 IMPLANT
EVACUATOR 1/8 PVC DRAIN (DRAIN) IMPLANT
FACESHIELD WRAPAROUND (MASK) ×12 IMPLANT
GAUZE SPONGE 2X2 8PLY STRL LF (GAUZE/BANDAGES/DRESSINGS) IMPLANT
GAUZE SPONGE 4X4 12PLY STRL (GAUZE/BANDAGES/DRESSINGS) IMPLANT
GLOVE BIO SURGEON STRL SZ7.5 (GLOVE) ×2 IMPLANT
GLOVE BIOGEL PI IND STRL 7.0 (GLOVE) ×1 IMPLANT
GLOVE BIOGEL PI IND STRL 7.5 (GLOVE) ×3 IMPLANT
GLOVE BIOGEL PI IND STRL 8 (GLOVE) ×1 IMPLANT
GLOVE BIOGEL PI INDICATOR 7.0 (GLOVE) ×1
GLOVE BIOGEL PI INDICATOR 7.5 (GLOVE) ×3
GLOVE BIOGEL PI INDICATOR 8 (GLOVE) ×1
GLOVE SURG SS PI 7.0 STRL IVOR (GLOVE) ×2 IMPLANT
GLOVE SURG SS PI 7.5 STRL IVOR (GLOVE) ×4 IMPLANT
GLOVE SURG SS PI 8.0 STRL IVOR (GLOVE) ×4 IMPLANT
GOWN BRE IMP PREV XXLGXLNG (GOWN DISPOSABLE) ×2 IMPLANT
GOWN STRL REUS W/ TWL LRG LVL3 (GOWN DISPOSABLE) ×1 IMPLANT
GOWN STRL REUS W/TWL LRG LVL3 (GOWN DISPOSABLE) ×1
GOWN STRL REUS W/TWL XL LVL3 (GOWN DISPOSABLE) ×6 IMPLANT
HANDPIECE INTERPULSE COAX TIP (DISPOSABLE) ×1
IMMOBILIZER KNEE 20 (SOFTGOODS) ×2
IMMOBILIZER KNEE 20 THIGH 36 (SOFTGOODS) ×1 IMPLANT
KIT BASIN OR (CUSTOM PROCEDURE TRAY) ×2 IMPLANT
MANIFOLD NEPTUNE II (INSTRUMENTS) ×2 IMPLANT
NDL SAFETY ECLIPSE 18X1.5 (NEEDLE) ×1 IMPLANT
NEEDLE HYPO 18GX1.5 SHARP (NEEDLE) ×1
PACK TOTAL JOINT (CUSTOM PROCEDURE TRAY) ×2 IMPLANT
PADDING CAST COTTON 6X4 STRL (CAST SUPPLIES) IMPLANT
PEN SKIN MARKING BROAD (MISCELLANEOUS) ×2 IMPLANT
POSITIONER SURGICAL ARM (MISCELLANEOUS) ×2 IMPLANT
SET HNDPC FAN SPRY TIP SCT (DISPOSABLE) ×1 IMPLANT
SPONGE GAUZE 2X2 STER 10/PKG (GAUZE/BANDAGES/DRESSINGS)
SPONGE SURGIFOAM ABS GEL 100 (HEMOSTASIS) IMPLANT
STAPLER VISISTAT (STAPLE) IMPLANT
STRIP CLOSURE SKIN 1/2X4 (GAUZE/BANDAGES/DRESSINGS) ×2 IMPLANT
SUCTION FRAZIER 12FR DISP (SUCTIONS) ×2 IMPLANT
SUT BONE WAX W31G (SUTURE) IMPLANT
SUT MNCRL AB 4-0 PS2 18 (SUTURE) ×2 IMPLANT
SUT VIC AB 1 CT1 27 (SUTURE) ×1
SUT VIC AB 1 CT1 27XBRD ANTBC (SUTURE) ×1 IMPLANT
SUT VIC AB 2-0 CT1 27 (SUTURE) ×3
SUT VIC AB 2-0 CT1 TAPERPNT 27 (SUTURE) ×3 IMPLANT
SUT VLOC 180 0 24IN GS25 (SUTURE) ×2 IMPLANT
SYR 50ML LL SCALE MARK (SYRINGE) ×2 IMPLANT
TOWEL OR 17X26 10 PK STRL BLUE (TOWEL DISPOSABLE) ×2 IMPLANT
TOWEL OR NON WOVEN STRL DISP B (DISPOSABLE) ×2 IMPLANT
TOWER CARTRIDGE SMART MIX (DISPOSABLE) ×2 IMPLANT
TRAY FOLEY CATH 14FRSI W/METER (CATHETERS) ×2 IMPLANT
WATER STERILE IRR 1500ML POUR (IV SOLUTION) ×2 IMPLANT
WRAP KNEE MAXI GEL POST OP (GAUZE/BANDAGES/DRESSINGS) ×2 IMPLANT
YANKAUER SUCT BULB TIP 10FT TU (MISCELLANEOUS) ×2 IMPLANT

## 2015-01-19 NOTE — Transfer of Care (Signed)
Immediate Anesthesia Transfer of Care Note  Patient: Jade [name]  Procedure(s) Performed: Procedure(s): RIGHT TOTAL KNEE ARTHROPLASTY (Right)  Patient Location: PACU  Anesthesia Type:Spinal  Level of Consciousness: awake, alert , oriented and patient cooperative  Airway & Oxygen Therapy: Patient Spontanous Breathing and Patient connected to face mask oxygen  Post-op Assessment: Report given to RN and Post -op Vital signs reviewed and stable  Post vital signs: stable  Last Vitals:  Filed Vitals:   01/19/15 0528  BP: 135/71  Pulse: 79  Temp: 36.6 C  Resp: 18    Complications: No apparent anesthesia complications  T 12 spinal level

## 2015-01-19 NOTE — H&P (View-Only) (Signed)
Jade [name] DOB: 1956/03/26 Married / Language: English / Race: Black or African American Female  Chief Complaint: Right knee pain  History of Present Illness The patient is a 59 year old female who presents today for follow up of their knee. The patient is being followed for their left total knee replacement. They are now 11 month(s) out from surgery. Symptoms reported today include: pain. Current treatment includes: activity modification. The following medication has been used for pain control: Percocet and Mobic, Robaxin and gabapentin.   Ver[place]ell follows up, 11 months s/p L knee, and continued R knee pain. The left knee does give her pain occasionally and sometimes she notes some popping but denies any instability, catching, or buckling. She sometimes notes some swelling as well and does note some today. Overall she is happy with her progress. She is more worried about her R knee which continues to cause severe pain. We did receive clearance from her PCP, but she required cardiac clearance last time, and we have been waiting on her cardiac clearance. She reports today her PCP told her that her EKG was the same and she would not require any cardiac clearance. She is anxious to proceed with surgery by the end of February due to continued significant pain and interference with ADLs. She would like refills on Robaxin and Percocet today.  Allergies  Zithromax Z-Pak *MACROLIDES* Aspirin *ANALGESICS - NonNarcotic* upset stomach, can tolerate for short periods  Family History Diabetes Mellitus First Degree Relatives. father Hypertension First Degree Relatives. mother and father First Degree Relatives reported Heart Disease father and grandmother mothers side  Social History Number of flights of stairs before winded. 1 Alcohol use. former drinker Marital status. married Drug/Alcohol Rehab (Previously). no Illicit drug use. no Living situation. live with spouse Drug/Alcohol Rehab  (Currently). no Children. 3 Current every day smoker Current work status. unemployed Tobacco use. Current every day smoker. current every day smoker Post-Surgical Plans. home with HHPT. one level home with 3 steps to enter  Medication History Percocet (7.5-325MG  Tablet, 1 (one) Oral twice daily as needed for pain, Taken starting 11/14/2014) Inactive. Robaxin (500MG  Tablet, 1 (one) Oral every eight hours, as needed, Taken starting 11/14/2014) Active. Meloxicam (7.5MG  Tablet, Oral) Active. Gabapentin (300MG  Capsule, Oral) Active. Premarin (0.625MG  Tablet, Oral) Active. AmLODIPine Besylate (10MG  Tablet, Oral) Active. Omeprazole (40MG  Capsule DR, Oral) Active. Medications Reconciled  Past Surgical History Neck Disc Surgery Hysterectomy. partial (non-cancerous) Arthroscopy of Knee. right Rotator Cuff Repair - Right Left total knee replacement  Past Medical Hx Gastroesophageal Reflux Disease High blood pressure Dentures Menopause  Review of Systems General Not Present- Fever and Weight Loss. Skin Not Present- Rash. Cardiovascular Not Present- Chest Pain and Shortness of Breath. Musculoskeletal Present- Joint Pain, Joint Stiffness, Joint Swelling and Muscle Pain. Neurological Not Present- Difficulty with balance, Loss of bladder control, Loss of bowel control, Numbness and Weakness. Endocrine Not Present- Excessive Thirst and Excessive Urination. Hematology Not Present- Easy Bruising.  Vitals 12/20/2014 10:48 AM Weight: 206 lb Height: 66in Body Surface Area: 2.03 m Body Mass Index: 33.25 kg/m  BP: 110/77 (Sitting, Right Arm, Standard)  Physical Exam General Mental Status -Alert. General Appearance-pleasant, Not in acute distress. Orientation-Oriented X3. Build & Nutrition-Well nourished and Well developed. Gait-Stiff.  Musculoskeletal Right Lower Extremity: Right Knee: Inspection and Palpation - Tenderness - medial joint line tender to  palpation, no tenderness to palpation of the superior calf, no tenderness to palpation of the pes anserine bursa, no tenderness to palpation of the quadriceps  tendon, no tenderness to palpation of the patellar tendon, no tenderness to palpation of the patella, no tenderness to palpation of the lateral joint line, no tenderness to palpation of the fibular head, no tenderness to palpation of the peroneal nerve. Swelling - periarticular swelling present. Effusion - mild. Tissue tension/texture is - soft. Crepitus - moderate patellofemoral crepitus. Pulses - 2+. Sensation - intact to light touch. Skin - Color - no ecchymosis, no erythema. Strength and Tone - Quadriceps - 5/5. Hamstrings - 5/5. ROM: Flexion - AROM - 90 . Extension - AROM - 5 . Stability - Valgus Laxity at 30 - None. Valgus Laxity at 0 - None. Varus Laxity at 30 - None. Varus Laxity at 0 - None. Lachman - Negative. Anterior Drawer Test - Negative. Posterior Drawer Test - Negative. Right Knee - Deformities/Malalignments/Discrepancies - no deformities noted. Special Tests - McMurray Test (lateral) - negative. McMurray Test (medial) - negative. Patellar Compression Pain - mild pain.  Imaging xrays ordered, obtained, reviewed today with no fx, subluxation, dislocation, lytic or blastic lesions. L TKA prosthesis in excellent alignment with no evidence of loosening or osteolysis. Good cement mantle noted. R knee with severe end-stage arthrosis medial compartment, bone-on-bone, with varus angulation, collapse of the joint space.  Assessment & Plan Primary osteoarthritis of right knee  Pt with severe end-stage R knee DJD, and 11 months s/p L TKA, L knee doing well. We discussed that her occasional popping and pain could be due to some scar tissue. Discussed continued HEP. Would not recommend debridement as her symptoms are not mechanical in nature and are intermittent. Expect this will continue to improve over time. In regards to her right knee,  we have received clearance from her PCP. She was told verbally by her PCP she did not need cardiac clearance because her EKG was unchanged. I discussed this with Dr. Tonita Cong and we will forego official cardiology clearance as her PCP did not require it. We will now proceed with scheduling process for R TKA. She is requesting sometime in February. Discussed post-op plans, home with HHPT, she will be staying with her sister initially. Discussed HEP, PT exercises, working on flexion and extension. She has been taking Percocet 7.5mg  for pain prn, may require Percocet 10mg  or even PO Dilaudid for a short time period. Will continue Percocet and Robaxin prn in the interim. We mutually agreed to forego her H&P appointment. All her questions were answered and she desires to proceed with surgery. She will follow up for suture removal and xrays 2 weeks post-op and will call with any questions or concerns in the interim.  Plan Right total knee replacement  Signed electronically by Lacie Draft, PA-C for Dr. Tonita Cong

## 2015-01-19 NOTE — Anesthesia Procedure Notes (Signed)
Spinal Patient location during procedure: OR Start time: 01/19/2015 7:35 AM End time: 01/19/2015 7:45 AM Staffing Anesthesiologist: Rod Mae L Performed by: anesthesiologist  Preanesthetic Checklist Completed: patient identified, site marked, surgical consent, pre-op evaluation, timeout performed, IV checked, risks and benefits discussed and monitors and equipment checked Spinal Block Patient position: sitting Prep: Betadine Patient monitoring: heart rate, continuous pulse ox and blood pressure Location: L2-3 Injection technique: single-shot Needle Needle type: [place]  Needle gauge: 25 G Needle length: 9 cm Assessment Sensory level: T6 Additional Notes Expiration date of kit checked and confirmed. Patient tolerated procedure well, without complications.

## 2015-01-19 NOTE — Evaluation (Signed)
Physical Therapy Evaluation Patient Details Name: Jade [name] MRN: [id number] DOB: [birth year] Today's Date: 01/19/2015   History of Present Illness  R TKR  Clinical Impression  Pt s/p R TKR presents with decreased R LE strength/ROM and post op knee and hip pain limiting functional mobility.  Pt should progress to d/c to sister's home with family assist and HHPT follow up.    Follow Up Recommendations Home health PT    Equipment Recommendations  None recommended by PT    Recommendations for Other Services OT consult     Precautions / Restrictions Precautions Precautions: Knee;Fall Required Braces or Orthoses: Knee Immobilizer - Right Knee Immobilizer - Right: Discontinue once straight leg raise with < 10 degree lag Restrictions Weight Bearing Restrictions: No Other Position/Activity Restrictions: WBAT      Mobility  Bed Mobility Overal bed mobility: Needs Assistance Bed Mobility: Supine to Sit;Sit to Supine     Supine to sit: Min assist;Mod assist Sit to supine: Min assist;Mod assist   General bed mobility comments: cues for sequence and use of L LE to self assist  Transfers Overall transfer level: Needs assistance Equipment used: Rolling walker (2 wheeled) Transfers: Sit to/from Stand Sit to Stand: Min assist;Mod assist         General transfer comment: cues for LE management and use of UEs to self assist  Ambulation/Gait Ambulation/Gait assistance: Min assist;+2 physical assistance;+2 safety/equipment Ambulation Distance (Feet): 16 Feet Assistive device: Rolling walker (2 wheeled) Gait Pattern/deviations: Step-to pattern;Decreased step length - right;Decreased step length - left;Shuffle;Trunk flexed Gait velocity: decr   General Gait Details: cues for sequence, posture and position from [employer]            Wheelchair Mobility    Modified Rankin (Stroke Patients Only)       Balance                                             Pertinent Vitals/Pain Pain Assessment: 0-10 Pain Score: 4  Pain Location: R knee and hip Pain Descriptors / Indicators: Sore Pain Intervention(s): Limited activity within patient's tolerance;Monitored during session;Premedicated before session (pt declined ice packs "they make it hurt more")    Home Living Family/patient expects to be discharged to:: Private residence Living Arrangements: Spouse/significant other Available Help at Discharge: Family Type of Home: House Home Access: Stairs to enter Entrance Stairs-Rails: None Entrance Stairs-Number of Steps: 2 Home Layout: One level Home Equipment: Walker - 2 wheels Additional Comments: Pt plans to stay with sister for several weeks    Prior Function Level of Independence: Independent;Independent with assistive device(s)               Hand Dominance        Extremity/Trunk Assessment   Upper Extremity Assessment: Overall WFL for tasks assessed           Lower Extremity Assessment: RLE deficits/detail      Cervical / Trunk Assessment: Normal  Communication   Communication: No difficulties  Cognition Arousal/Alertness: Awake/alert Behavior During Therapy: WFL for tasks assessed/performed Overall Cognitive Status: Within Functional Limits for tasks assessed                      General Comments      Exercises        Assessment/Plan    PT Assessment Patient needs continued  PT services  PT Diagnosis Difficulty walking   PT Problem List Decreased strength;Decreased range of motion;Decreased activity tolerance;Decreased mobility;Decreased knowledge of use of DME;Pain  PT Treatment Interventions DME instruction;Gait training;Stair training;Functional mobility training;Therapeutic activities;Therapeutic exercise;Patient/family education   PT Goals (Current goals can be found in the Care Plan section) Acute Rehab PT Goals Patient Stated Goal: Resume previous lifestyle with decreased pain PT  Goal Formulation: With patient Time For Goal Achievement: 01/26/15 Potential to Achieve Goals: Good    Frequency 7X/week   Barriers to discharge        Co-evaluation               End of Session Equipment Utilized During Treatment: Gait belt;Right knee immobilizer Activity Tolerance: Patient tolerated treatment well Patient left: in bed;with call bell/phone within reach;with family/visitor present Nurse Communication: Mobility status         Time: 1410-1440 PT Time Calculation (min) (ACUTE ONLY): 30 min   Charges:   PT Evaluation $Initial PT Evaluation Tier I: 1 Procedure PT Treatments $Gait Training: 8-22 mins   PT G Codes:        Jade [name] 02-08-2015, 4:49 PM

## 2015-01-19 NOTE — Brief Op Note (Signed)
01/19/2015  9:34 AM  PATIENT:  Jade [name]  59 y.o. female  PRE-OPERATIVE DIAGNOSIS:  right knee degenerative joint disease  POST-OPERATIVE DIAGNOSIS:  right knee degenerative joint disease  PROCEDURE:  Procedure(s): RIGHT TOTAL KNEE ARTHROPLASTY (Right)  SURGEON:  Surgeon(s) and Role:    * Johnn Hai, MD - Primary  PHYSICIAN ASSISTANT:   ASSISTANTS: Bissell   ANESTHESIA:   general  EBL:  Total I/O In: 2000 [I.V.:2000] Out: 500 [Urine:400; Blood:100]  BLOOD ADMINISTERED:none  DRAINS: none   LOCAL MEDICATIONS USED:  MARCAINE     SPECIMEN:  No Specimen  DISPOSITION OF SPECIMEN:  N/A  COUNTS:  YES  TOURNIQUET:  * Missing tourniquet times found for documented tourniquets in log:  [id number] *  DICTATION: .Other Dictation: Dictation Number [id number]  PLAN OF CARE: Admit to inpatient   PATIENT DISPOSITION:  PACU - hemodynamically stable.   Delay start of Pharmacological VTE agent (>24hrs) due to surgical blood loss or risk of bleeding: no

## 2015-01-19 NOTE — Anesthesia Preprocedure Evaluation (Addendum)
Anesthesia Evaluation  Patient identified by MRN, date of birth, ID band Patient awake    Reviewed: Allergy & Precautions, H&P , NPO status , Patient's Chart, lab work & pertinent test results  History of Anesthesia Complications (+) Family history of anesthesia reaction and history of anesthetic complications  Airway Mallampati: II  TM Distance: >3 FB Neck ROM: Full    Dental  (+) Dental Advisory Given, Teeth Intact   Pulmonary shortness of breath, Current Smoker,    Pulmonary exam normal       Cardiovascular hypertension, Pt. on medications Rhythm:Regular Rate:Normal     Neuro/Psych negative neurological ROS  negative psych ROS   GI/Hepatic Neg liver ROS, GERD-  Medicated,  Endo/Other  negative endocrine ROS  Renal/GU negative Renal ROS     Musculoskeletal negative musculoskeletal ROS (+)   Abdominal (+) + obese,   Peds  Hematology negative hematology ROS (+)   Anesthesia Other Findings   Reproductive/Obstetrics negative OB ROS                            Anesthesia Physical Anesthesia Plan  ASA: III  Anesthesia Plan: Spinal   Post-op Pain Management:    Induction:   Airway Management Planned:   Additional Equipment:   Intra-op Plan:   Post-operative Plan:   Informed Consent:   Plan Discussed with: Surgeon  Anesthesia Plan Comments:         Anesthesia Quick Evaluation

## 2015-01-19 NOTE — Anesthesia Postprocedure Evaluation (Signed)
  Anesthesia Post-op Note  Patient: Jade [name]  Procedure(s) Performed: Procedure(s) (LRB): RIGHT TOTAL KNEE ARTHROPLASTY (Right)  Patient Location: PACU  Anesthesia Type: Spinal  Level of Consciousness: awake and alert   Airway and Oxygen Therapy: Patient Spontanous Breathing  Post-op Pain: mild  Post-op Assessment: Post-op Vital signs reviewed, Patient's Cardiovascular Status Stable, Respiratory Function Stable, Patent Airway and No signs of Nausea or vomiting  Last Vitals:  Filed Vitals:   01/19/15 1100  BP: 118/71  Pulse: 76  Temp: 36.6 C  Resp: 15    Post-op Vital Signs: stable   Complications: No apparent anesthesia complications

## 2015-01-19 NOTE — Interval H&P Note (Signed)
History and Physical Interval Note:  01/19/2015 7:10 AM  Jade [name]  has presented today for surgery, with the diagnosis of right knee djd  The various methods of treatment have been discussed with the patient and family. After consideration of risks, benefits and other options for treatment, the patient has consented to  Procedure(s): RIGHT TOTAL KNEE ARTHROPLASTY (Right) as a surgical intervention .  The patient's history has been reviewed, patient examined, no change in status, stable for surgery.  I have reviewed the patient's chart and labs.  Questions were answered to the patient's satisfaction.     Aryiana Klinkner C

## 2015-01-19 NOTE — Care Management Note (Unsigned)
    Page 1 of 1   01/19/2015     1:10:11 PM CARE MANAGEMENT NOTE 01/19/2015  Patient:  Jade [name], Jade [name]   Account Number:  [id number]  Date Initiated:  01/19/2015  Documentation initiated by:  A [facility] Place  Subjective/Objective Assessment:   adm: RIGHT TOTAL KNEE ARTHROPLASTY (Right)     Action/Plan:   discharge planning   Anticipated DC Date:  01/21/2015   Anticipated DC Plan:  [place]  CM consult      [facility] Choice  HOME HEALTH   Choice offered to / List presented to:  C-1 Patient        [place] arranged  [place] [facility]      [place].   Status of service:  In process, will continue to follow Medicare Important Message given?   (If response is "NO", the following Medicare IM given date fields will be blank) Date Medicare IM given:   Medicare IM given by:   Date Additional Medicare IM given:   Additional Medicare IM given by:    Discharge Disposition:  [place]  Per UR Regulation:    If discussed at Long Length of Stay Meetings, dates discussed:    Comments:  01/19/15 13:00 Cm spoke with pt who is refusing SNF and requests [facility] for HHPT.  Pt will be staying iwth her sister after discharge and will have the address and contact information at a later time (post-op and in pain).  CM will continue to follow for DME needs.  Referral called to [facility] rep, Kristen.  Mariane Masters, BSN, Cm [phone].

## 2015-01-20 ENCOUNTER — Encounter (HOSPITAL_COMMUNITY): Payer: Self-pay | Admitting: Specialist

## 2015-01-20 LAB — BASIC METABOLIC PANEL
Anion gap: 11 (ref 5–15)
BUN: 8 mg/dL (ref 6–23)
CO2: 24 mmol/L (ref 19–32)
Calcium: 9.2 mg/dL (ref 8.4–10.5)
Chloride: 105 mmol/L (ref 96–112)
Creatinine, Ser: 0.71 mg/dL (ref 0.50–1.10)
GFR calc Af Amer: >90 mL/min (ref >90)
GFR calc non Af Amer: >90 mL/min (ref >90)
Glucose, Bld: 148 mg/dL — ABNORMAL HIGH (ref 70–99)
Potassium: 3.9 mmol/L (ref 3.5–5.1)
Sodium: 140 mmol/L (ref 135–145)

## 2015-01-20 LAB — CBC
HCT: 38.9 % (ref 36.0–46.0)
Hemoglobin: 12.5 g/dL (ref 12.0–15.0)
MCH: 26.9 pg (ref 26.0–34.0)
MCHC: 32.1 g/dL (ref 30.0–36.0)
MCV: 83.7 fL (ref 78.0–100.0)
Platelets: 313 10*3/uL (ref 150–400)
RBC: 4.65 MIL/uL (ref 3.87–5.11)
RDW: 13.7 % (ref 11.5–15.5)
WBC: 16.3 10*3/uL — ABNORMAL HIGH (ref 4.0–10.5)

## 2015-01-20 MED ORDER — HYDROCODONE-ACETAMINOPHEN 5-325 MG PO TABS
2.0000 | ORAL_TABLET | ORAL | Status: DC | PRN
  Administered 2015-01-20 – 2015-01-22 (×9): 2 via ORAL
  Filled 2015-01-20 (×10): qty 2

## 2015-01-20 NOTE — Progress Notes (Signed)
Physical Therapy Treatment Patient Details Name: Jade [name] MRN: [id number] DOB: Apr 15, 1956 Today's Date: 01/20/2015    History of Present Illness R TKR    PT Comments    Pt cooperative but continues ltd by c/o R hip/knee pain  Follow Up Recommendations  Home health PT     Equipment Recommendations  None recommended by PT    Recommendations for Other Services OT consult     Precautions / Restrictions Precautions Precautions: Knee;Fall Required Braces or Orthoses: Knee Immobilizer - Right Knee Immobilizer - Right: Discontinue once straight leg raise with < 10 degree lag Restrictions Weight Bearing Restrictions: No Other Position/Activity Restrictions: WBAT    Mobility  Bed Mobility Overal bed mobility: Needs Assistance Bed Mobility: Supine to Sit     Supine to sit: Min assist     General bed mobility comments: cues for sequence and use of L LE to self assist  Transfers Overall transfer level: Needs assistance Equipment used: Rolling walker (2 wheeled) Transfers: Sit to/from Stand Sit to Stand: Min assist;Mod assist         General transfer comment: cues for LE management and use of UEs to self assist  Ambulation/Gait Ambulation/Gait assistance: Min assist;Mod assist Ambulation Distance (Feet): 22 Feet Assistive device: Rolling walker (2 wheeled) Gait Pattern/deviations: Step-to pattern;Decreased step length - right;Decreased step length - left;Shuffle;Trunk flexed Gait velocity: decr   General Gait Details: cues for sequence, posture and position from [employer]            Wheelchair Mobility    Modified Rankin (Stroke Patients Only)       Balance                                    Cognition Arousal/Alertness: Awake/alert Behavior During Therapy: WFL for tasks assessed/performed Overall Cognitive Status: Within Functional Limits for tasks assessed                      Exercises Total Joint  Exercises Ankle Circles/Pumps: AROM;10 reps;Right;Supine Quad Sets: AAROM;Right;10 reps;Supine Heel Slides: AAROM;Right;10 reps;Supine Straight Leg Raises: AAROM;Right;10 reps;Supine Goniometric ROM: AAROM at knee -10 - 40    General Comments        Pertinent Vitals/Pain Pain Assessment: 0-10 Pain Score: 6  Pain Location: R knee and hip Pain Descriptors / Indicators: Aching;Sore Pain Intervention(s): Limited activity within patient's tolerance;Monitored during session;Premedicated before session;Ice applied    Home Living                      Prior Function            PT Goals (current goals can now be found in the care plan section) Acute Rehab PT Goals Patient Stated Goal: Resume previous lifestyle with decreased pain PT Goal Formulation: With patient Time For Goal Achievement: 01/26/15 Potential to Achieve Goals: Good Progress towards PT goals: Progressing toward goals    Frequency  7X/week    PT Plan Current plan remains appropriate    Co-evaluation             End of Session Equipment Utilized During Treatment: Gait belt;Right knee immobilizer Activity Tolerance: Patient limited by fatigue;Patient limited by pain Patient left: in chair;with call bell/phone within reach     Time: 1027-1102 PT Time Calculation (min) (ACUTE ONLY): 35 min  Charges:  $Gait Training: 8-22 mins $Therapeutic Exercise: 8-22 mins  G Codes:      Jade [name] 01-23-15, 12:34 PM

## 2015-01-20 NOTE — Progress Notes (Signed)
PT Cancellation Note  Patient Details Name: Jade [name] MRN: [id number] DOB: 11/13/1956   Cancelled Treatment:     Pt declined pm tx 2* c/o pain.  RN aware.  Will follow in am.   Raffaele Derise 01/20/2015, 4:59 PM

## 2015-01-20 NOTE — Discharge Instructions (Signed)
Elevate leg above heart 6x a day for 71minutes each Use knee immobilizer while walking until can SLR x 10 Use knee immobilizer in bed to keep knee in extension Aquacel dressing may remain in place until follow up. May shower with aquacel dressing in place. If the dressing becomes saturated or peels off, you may remove aquacel dressing. Do not remove steri-strips if they are present. Place new dressing with gauze and tape or ACE bandage which should be kept clean and dry and changed daily. =======================================================================================  Information on my medicine - XARELTO (Rivaroxaban)  This medication education was reviewed with me or my healthcare representative as part of my discharge preparation.   Why was Xarelto prescribed for you? Xarelto was prescribed for you to reduce the risk of blood clots forming after orthopedic surgery. The medical term for these abnormal blood clots is venous thromboembolism (VTE).  What do you need to know about xarelto ? Take your Xarelto ONCE DAILY at the same time every day. You may take it either with or without food.  If you have difficulty swallowing the tablet whole, you may crush it and mix in applesauce just prior to taking your dose.  Take Xarelto exactly as prescribed by your doctor and DO NOT stop taking Xarelto without talking to the doctor who prescribed the medication.  Stopping without other VTE prevention medication to take the place of Xarelto may increase your risk of developing a clot.  After discharge, you should have regular check-up appointments with your healthcare provider that is prescribing your Xarelto.    What do you do if you miss a dose? If you miss a dose, take it as soon as you remember on the same day then continue your regularly scheduled once daily regimen the next day. Do not take two doses of Xarelto on the same day.   Important Safety Information A possible side  effect of Xarelto is bleeding. You should call your healthcare provider right away if you experience any of the following: ? Bleeding from an injury or your nose that does not stop. ? Unusual colored urine (red or dark brown) or unusual colored stools (red or black). ? Unusual bruising for unknown reasons. ? A serious fall or if you hit your head (even if there is no bleeding).  Some medicines may interact with Xarelto and might increase your risk of bleeding while on Xarelto. To help avoid this, consult your healthcare provider or pharmacist prior to using any new prescription or non-prescription medications, including herbals, vitamins, non-steroidal anti-inflammatory drugs (NSAIDs) and supplements.  This website has more information on Xarelto: [email or web address]-benson.[email or web address]

## 2015-01-20 NOTE — Progress Notes (Signed)
CSW consulted for SNF placement. PN reviewed. Pt plans to d/c to her sister's home following hospital d/c. RNCM is assisting with d/c planning. CSW is available to assist with ST Rehab if pt's plans change.  Werner Lean LCSW [phone]

## 2015-01-20 NOTE — Progress Notes (Signed)
Patient ID: Jade [name], female   DOB: [birth year], 59 y.o.   MRN: [id number] Subjective: 1 Day Post-Op Procedure(s) (LRB): RIGHT TOTAL KNEE ARTHROPLASTY (Right) Patient reports pain as moderate.    Patient has complaints of R knee pain. Reports this feels more painful than left knee did last year post-op. Was limited by dizziness in PT yesterday. Has not been OOB yet today. Foley has been removed. She's been noting some soreness R lateral hip/buttock in the last few weeks which she attributed to her worsening antalgic gait, denies any injury to the hip or groin pain. We will start therapy today. Plan is to go home with HHPT after hospital stay.  Objective: Vital signs in last 24 hours: Temp:  [97.7 F (36.5 C)-99.6 F (37.6 C)] 99.6 F (37.6 C) (02/26 0654) Pulse Rate:  [74-95] 88 (02/26 0654) Resp:  [15-27] 18 (02/26 0654) BP: (87-159)/(50-83) 159/74 mmHg (02/26 0654) SpO2:  [96 %-100 %] 97 % (02/26 0654) Weight:  [94.802 kg (209 lb)] 94.802 kg (209 lb) (02/25 1145)  Intake/Output from previous day:  Intake/Output Summary (Last 24 hours) at 01/20/15 0733 Last data filed at 01/20/15 0654  Gross per 24 hour  Intake 4657.5 ml  Output   5250 ml  Net -592.5 ml    Intake/Output this shift:    Labs: Results for orders placed or performed during the hospital encounter of 01/19/15  Glucose, capillary  Result Value Ref Range   Glucose-Capillary 101 (H) 70 - 99 mg/dL   Comment 1 Notify RN    Comment 2 Document in Chart   CBC  Result Value Ref Range   WBC 16.3 (H) 4.0 - 10.5 K/uL   RBC 4.65 3.87 - 5.11 MIL/uL   Hemoglobin 12.5 12.0 - 15.0 g/dL   HCT 38.9 36.0 - 46.0 %   MCV 83.7 78.0 - 100.0 fL   MCH 26.9 26.0 - 34.0 pg   MCHC 32.1 30.0 - 36.0 g/dL   RDW 13.7 11.5 - 15.5 %   Platelets 313 150 - 400 K/uL  Basic metabolic panel  Result Value Ref Range   Sodium 140 135 - 145 mmol/L   Potassium 3.9 3.5 - 5.1 mmol/L   Chloride 105 96 - 112 mmol/L   CO2 24 19 - 32 mmol/L   Glucose, Bld 148 (H) 70 - 99 mg/dL   BUN 8 6 - 23 mg/dL   Creatinine, Ser 0.71 0.50 - 1.10 mg/dL   Calcium 9.2 8.4 - 10.5 mg/dL   GFR calc non Af Amer >90 >90 mL/min   GFR calc Af Amer >90 >90 mL/min   Anion gap 11 5 - 15  Type and screen  Result Value Ref Range   ABO/RH(D) O POS    Antibody Screen NEG    Sample Expiration 01/22/2015     Exam - Neurologically intact ABD soft Neurovascular intact Sensation intact distally Intact pulses distally Dorsiflexion/Plantar flexion intact Incision: dressing C/D/I and no drainage No cellulitis present Compartment soft no calf pain or sign of DVT Dressing - clean, dry, no drainage Motor function intact - moving foot and toes well on exam.    Assessment/Plan: 1 Day Post-Op Procedure(s) (LRB): RIGHT TOTAL KNEE ARTHROPLASTY (Right)  Advance diet Up with therapy D/C IV fluids Past Medical History  Diagnosis Date  . GERD (gastroesophageal reflux disease)   . Benign microscopic hematuria     neg cystoscopy 11/12- dr. Janice Norrie  . Hypertension   . Hyperlipidemia   . Shortness of breath  with  exertion  . Numbness and tingling in left arm   . DJD (degenerative joint disease)     L KNEE  . Family history of anesthesia complication     multiple family members with history of this  . Malignant hyperthermia     DVT Prophylaxis - Xarelto Protocol Weight-Bearing as tolerated to Right leg No vaccines. Up with PT today Likely secondary R hip bursitis due to her gait, expected to improve as her gait improves, could consider local steroid injection at office follow up if ongoing. She may use heat on the right hip/buttock as needed Plan D/C home with HHPT when ready, likely Sunday, depending on progress with PT Will discuss with Dr. Mliss Fritz, [place]. 01/20/2015, 7:33 AM

## 2015-01-20 NOTE — Progress Notes (Signed)
OT Cancellation Note  Patient Details Name: [name] MRN: [id number] DOB: Jul 19, 1956   Cancelled Treatment:    Reason Eval/Treat Not Completed: Other (comment).  Pt was limited by nausea earlier:  Did not have a chance to return. OT will check on pt in am.  Monish Haliburton 01/20/2015, 3:24 PM  Lesle Chris, OTR/L [phone] 01/20/2015

## 2015-01-20 NOTE — Progress Notes (Signed)
Utilization review completed.  

## 2015-01-20 NOTE — Op Note (Signed)
[name], [name]              ACCOUNT NO.:  [id number]  MEDICAL RECORD NO.:  [id number]  LOCATION:  6644                         FACILITY:  [facility]  PHYSICIAN:  Susa Day, M.D.    DATE OF BIRTH:  06/01/1956  DATE OF PROCEDURE:  01/19/2015 DATE OF DISCHARGE:                              OPERATIVE REPORT   PREOPERATIVE DIAGNOSES:  End-stage degenerative joint disease and osteoarthrosis, right knee.  POSTOPERATIVE DIAGNOSES:  End-stage degenerative joint disease and osteoarthrosis, right knee.  PROCEDURE PERFORMED:  Right total knee arthroplasty.  ANESTHESIA:  General.  ASSISTANT:  Cleophas Dunker, PA.  COMPONENTS:  DePuy rotating platform, 3 femur, 3 tibia, 15 insert, 38 patella.  HISTORY:  A 59 year old female with bone-on-bone arthrosis, refractory to conservative treatment, severe, negative effect her activities of daily living using the cane.  X-rays of bone-on-bone arthrosis, indicated for replacement of degenerated joint.  Risks and benefits were discussed including bleeding, infection, damage to neurovascular structure, DVT, PE, anesthetic complications, suboptimal range of motion, etc.  TECHNIQUE:  With the patient in supine position, after induction of adequate general anesthesia, 2 g of Kefzol and 900 of clindamycin, right lower extremity was prepped and draped and exsanguinated in the usual sterile fashion.  Thigh tourniquet was inflated to 300 mmHg.  Midline incision was made over the patella.  Full-thickness flaps developed. Median parapatellar arthrotomy performed.  Soft tissues elevated medially preserving the MCL.  Patellae everted and knee flexed. Tricompartmental severe osteoporosis with bone-on-bone was noted. Rongeur was utilized to remove the remnants of medial and lateral menisci and the ACL as well as performed the synovectomy and removed bone osteophytes.  A step drill was utilized to enter the femoral canal parallel with the shaft.  It was  irrigated and I inserted the 5-degree right with 11 off the distal femur due to a slight flexion contracture. This was pinned and we performed the distal femoral cut.  We then sized off the anterior cortex of the femur.  It was actually a 3.5, so we pinned it at a 3 and then lowered at 2 mm and was flushed to the anterior cortex, pinned it and then performed an anterior, posterior, and chamfer cuts with an oscillating saw, did not notch the femur. Turned our attention towards the tibia, subluxed the tibia.  With [facility], protecting the soft tissues at all times.  External alignment guide was used, 2 off the defect, which was medially, bisecting the tibiotalar joint parallel to the shaft, slight slope, this was pinned and we performed our cut without difficulty.  We tested our flexion and extension gaps, they were equivalent.  We used a 12.5 insert.  We next turned our attention towards completing the tibia.  Again, used a McHale retractor, was maximized with 3 just to the medial aspect of the tibial tubercle.  Pinned, drilled the central canal and then used our punch guide and then completed the femur with a box cut, bisecting the canal, flushed to the lateral aspect of the femur, pinned it, performed our box cut.  We put a trial femur and tibia and a 12.5 insert.  We had full extension and full flexion and good stability.  We then  turned our attention towards completing the patella, it was measured at a 24.  We used our patellar clamp.  We planed 9.5 from that.  Using our patellar jig, we then measured it to 14 after the patellar cut and then we used a 38 to drill our PEG holes, medializing them, placed a trial patella and we had excellent patellofemoral tracking following it.  We removed all instrumentation.  We checked posteriorly, cauterized the geniculate. Popliteus was intact.  We used pulsatile lavage, copiously irrigate the knee.  Knee was then flexed.  McHale retractor was used,  all surfaces were thoroughly dried.  Mixed the cement in the back table and injected it into the tibial canal under pressure, digitally pressurizing that and used our 3 tray and impacted it.  With redundant cement removed, we cemented the femur and impacted it.  Redundant cement removed.  We placed a trial 15 in their, reduced it and held an axial load throughout the curing of the cement with the knee straight.  We then cemented the patellar button as well.  We used a clamp.  We removed redundant cement. We injected the periarticular tissues with 0.25% Marcaine with epinephrine.  Copiously irrigated with antibiotic irrigation.  Following this, we had excellent patellofemoral tracking, good stability, full extension, full flexion, negative anterior drawer.  We selected the 15, removed the trial, checked posteriorly, and meticulously removed redundant cement.  Copiously irrigated with pulsatile lavage and then antibiotic irrigation.  Subluxed the tibia.  Placed a 15 permanent rotating platform insert, reduced it and again had excellent patellofemoral tracking next in full extension and full flexion.  Good stability of varus and valgus stressing 0-30 degrees.  Placed bone wax on the cancellous surfaces.  Very minimal cancellous surfaces that were used.  We therefore did not use a drain.  On slight flexion, we approximated the patellar arthrotomy with 1 Vicryl interrupted figure-of- eight sutures and then a running V-Loc over that.  We flexed it to 140 and straightened it, excellent patellofemoral tracking and excellent stability.  Subcu with multiple 2-0 layers and we irrigated it to the subcu and then 4-0 subcuticular Monocryl.  Sterile dressing was applied. Tourniquet was deflated.  There was adequate revascularization of lower extremity appreciated.  The patient tolerated the procedure well.  No complications.  Tourniquet time was 90 minutes.  Assistant, Cleophas Dunker, PA was  absolutely used throughout the case for positioning, closure and exposure.     Susa Day, M.D.     Jade [name]  D:  01/19/2015  T:  01/20/2015  Job:  [id number]

## 2015-01-21 LAB — CBC
HCT: 38.7 % (ref 36.0–46.0)
Hemoglobin: 12.4 g/dL (ref 12.0–15.0)
MCH: 26.7 pg (ref 26.0–34.0)
MCHC: 32 g/dL (ref 30.0–36.0)
MCV: 83.4 fL (ref 78.0–100.0)
Platelets: 310 10*3/uL (ref 150–400)
RBC: 4.64 MIL/uL (ref 3.87–5.11)
RDW: 13.8 % (ref 11.5–15.5)
WBC: 13.6 10*3/uL — ABNORMAL HIGH (ref 4.0–10.5)

## 2015-01-21 NOTE — Care Management Note (Signed)
CARE MANAGEMENT NOTE 01/21/2015  Patient:  Jade [name], Jade [name]   Account Number:  [id number]  Date Initiated:  01/19/2015  Documentation initiated by:  [facility]  Subjective/Objective Assessment:   adm: RIGHT TOTAL KNEE ARTHROPLASTY (Right)     Action/Plan:   discharge planning   Anticipated DC Date:  01/21/2015   Anticipated DC Plan:  [place]  CM consult      [facility] Choice  HOME HEALTH   Choice offered to / List presented to:  C-1 Patient        [place] arranged  [place] PT      [place].   Status of service:  Completed, signed off Medicare Important Message given?   (If response is "NO", the following Medicare IM given date fields will be blank) Date Medicare IM given:   Medicare IM given by:   Date Additional Medicare IM given:   Additional Medicare IM given by:    Discharge Disposition:  [place]  Per UR Regulation:    If discussed at Long Length of Stay Meetings, dates discussed:    Comments:  01/21/15  10:41  NCM spoke with patient and she says she already has all DME (walker, 3 in 1 and high chair) she needs at home from lasts year.  Patient also [facility], HH has been set up and confirmed with [place]. Marylyn Ishihara, RN, BSN, CCM  01/19/15 13:00 Cm spoke with pt who is refusing SNF and requests [facility] for HHPT.  Pt will be staying iwth her sister after discharge and will have the address and contact information at a later time (post-op and in pain).  CM will continue to follow for DME needs.  Referral called to [facility] rep, Kristen.  Mariane Masters, BSN, Cm [phone].

## 2015-01-21 NOTE — Progress Notes (Signed)
Physical Therapy Treatment Patient Details Name: Jade [name] MRN: [id number] DOB: Nov 19, 1956 Today's Date: 02/03/15    History of Present Illness R TKR    PT Comments    Pt cooperative but pain ltd.  Amb deferred to after bfast at pt request.  Follow Up Recommendations  Home health PT     Equipment Recommendations  None recommended by PT    Recommendations for Other Services OT consult     Precautions / Restrictions Precautions Precautions: Knee;Fall Required Braces or Orthoses: Knee Immobilizer - Right Knee Immobilizer - Right: Discontinue once straight leg raise with < 10 degree lag Restrictions Weight Bearing Restrictions: No Other Position/Activity Restrictions: WBAT    Mobility  Bed Mobility                  Transfers Overall transfer level: Needs assistance Equipment used: Rolling walker (2 wheeled) Transfers: Sit to/from Stand Sit to Stand: Min assist         General transfer comment: cues for LE management and use of UEs to self assist  Ambulation/Gait                 Stairs            Wheelchair Mobility    Modified Rankin (Stroke Patients Only)       Balance                                    Cognition Arousal/Alertness: Awake/alert Behavior During Therapy: WFL for tasks assessed/performed Overall Cognitive Status: Within Functional Limits for tasks assessed                      Exercises Total Joint Exercises Ankle Circles/Pumps: AROM;10 reps;Right;Supine Quad Sets: AAROM;Right;Supine;15 reps Heel Slides: AAROM;Right;10 reps;Supine Straight Leg Raises: AAROM;Right;10 reps;Supine Goniometric ROM: AAROM at R knee -10 - 40 - pain limited    General Comments        Pertinent Vitals/Pain Pain Assessment: 0-10 Pain Score: 6  Pain Location: R knee Pain Descriptors / Indicators: Aching;Sore Pain Intervention(s): Limited activity within patient's tolerance;Monitored during  session;Premedicated before session    Home Living                      Prior Function            PT Goals (current goals can now be found in the care plan section) Acute Rehab PT Goals Patient Stated Goal: Resume previous lifestyle with decreased pain PT Goal Formulation: With patient Time For Goal Achievement: 01/26/15 Potential to Achieve Goals: Good Progress towards PT goals: Progressing toward goals    Frequency  7X/week    PT Plan Current plan remains appropriate    Co-evaluation             End of Session Equipment Utilized During Treatment: Gait belt;Right knee immobilizer Activity Tolerance: Patient limited by fatigue;Patient limited by pain Patient left: in chair;with call bell/phone within reach     Time: 0847-0905 PT Time Calculation (min) (ACUTE ONLY): 18 min  Charges:  $Therapeutic Exercise: 8-22 mins                    G Codes:      Dagoberto Nealy 02-03-2015, 11:41 AM

## 2015-01-21 NOTE — Progress Notes (Signed)
Physical Therapy Treatment Patient Details Name: Jade [name] MRN: [id number] DOB: 1956/06/24 Today's Date: 02/04/2015    History of Present Illness R TKR    PT Comments    Pt progressing steadily with mobility.  Follow Up Recommendations  Home health PT     Equipment Recommendations  None recommended by PT    Recommendations for Other Services OT consult     Precautions / Restrictions Precautions Precautions: Knee;Fall Required Braces or Orthoses: Knee Immobilizer - Right Knee Immobilizer - Right: Discontinue once straight leg raise with < 10 degree lag Restrictions Weight Bearing Restrictions: No Other Position/Activity Restrictions: WBAT    Mobility  Bed Mobility Overal bed mobility: Needs Assistance Bed Mobility: Sit to Supine       Sit to supine: Min assist   General bed mobility comments: cues for sequence and use of L LE to self assist  Transfers Overall transfer level: Needs assistance Equipment used: Rolling walker (2 wheeled) Transfers: Sit to/from Stand Sit to Stand: Min guard         General transfer comment: cues for LE management and use of UEs to self assist  Ambulation/Gait Ambulation/Gait assistance: Min assist Ambulation Distance (Feet): 130 Feet Assistive device: Rolling walker (2 wheeled) Gait Pattern/deviations: Step-to pattern;Step-through pattern Gait velocity: decr   General Gait Details: cues for sequence, posture and position from [employer]            Wheelchair Mobility    Modified Rankin (Stroke Patients Only)       Balance                                    Cognition Arousal/Alertness: Awake/alert Behavior During Therapy: WFL for tasks assessed/performed Overall Cognitive Status: Within Functional Limits for tasks assessed                      Exercises      General Comments        Pertinent Vitals/Pain Pain Assessment: 0-10 Pain Score: 6  Pain Location: R  knee Pain Descriptors / Indicators: Aching;Sore Pain Intervention(s): Limited activity within patient's tolerance;Monitored during session;Premedicated before session;Ice applied    Home Living                      Prior Function            PT Goals (current goals can now be found in the care plan section) Acute Rehab PT Goals Patient Stated Goal: Resume previous lifestyle with decreased pain PT Goal Formulation: With patient Time For Goal Achievement: 01/26/15 Potential to Achieve Goals: Good Progress towards PT goals: Progressing toward goals    Frequency  7X/week    PT Plan Current plan remains appropriate    Co-evaluation             End of Session Equipment Utilized During Treatment: Gait belt;Right knee immobilizer Activity Tolerance: Patient limited by fatigue;Patient limited by pain Patient left: in bed;with call bell/phone within reach     Time: 0932-0952 PT Time Calculation (min) (ACUTE ONLY): 20 min  Charges:  $Gait Training: 8-22 mins                    G Codes:      Zakiyyah Savannah 02-04-2015, 1:15 PM

## 2015-01-21 NOTE — Care Management Note (Signed)
CARE MANAGEMENT NOTE 01/21/2015  Patient:  Jade [name], Jade [name]   Account Number:  [id number]  Date Initiated:  01/19/2015  Documentation initiated by:  [facility]  Subjective/Objective Assessment:   adm: RIGHT TOTAL KNEE ARTHROPLASTY (Right)     Action/Plan:   discharge planning   Anticipated DC Date:  01/21/2015   Anticipated DC Plan:  [place]  CM consult      [facility] Choice  HOME HEALTH   Choice offered to / List presented to:  C-1 Patient        [place] arranged  [place] [facility]      [place].   Status of service:  Completed, signed off Medicare Important Message given?   (If response is "NO", the following Medicare IM given date fields will be blank) Date Medicare IM given:   Medicare IM given by:   Date Additional Medicare IM given:   Additional Medicare IM given by:    Discharge Disposition:  [place]  Per UR Regulation:    If discussed at Long Length of Stay Meetings, dates discussed:    Comments:  01/21/15  10:41  NCM spoke with patient and she says she has all DME (walker, 3 in 1 and high chair) she needs at home from lasts year.  Marylyn Ishihara, RN, BSN, CCM  01/19/15 13:00 Cm spoke with pt who is refusing SNF and requests [facility] for HHPT.  Pt will be staying iwth her sister after discharge and will have the address and contact information at a later time (post-op and in pain).  CM will continue to follow for DME needs.  Referral called to [facility] rep, Kristen.  Mariane Masters, BSN, Cm [phone].

## 2015-01-21 NOTE — Progress Notes (Signed)
Physical Therapy Treatment Patient Details Name: Jade [name] MRN: [id number] DOB: [birth year] Today's Date: 01/21/2015    History of Present Illness R TKR    PT Comments    Pt progressing, wants to defer stair practice today and perform in the am. Possible D/C tomorrow.  Follow Up Recommendations  Home health PT     Equipment Recommendations  None recommended by PT    Recommendations for Other Services       Precautions / Restrictions Precautions Precautions: Knee;Fall Required Braces or Orthoses: Knee Immobilizer - Right Knee Immobilizer - Right: Discontinue once straight leg raise with < 10 degree lag Restrictions Weight Bearing Restrictions: No Other Position/Activity Restrictions: WBAT    Mobility  Bed Mobility Overal bed mobility: Needs Assistance Bed Mobility: Supine to Sit;Sit to Supine     Supine to sit: Min assist Sit to supine: Min assist   General bed mobility comments: cues for sequence and use of L LE to self assist  Transfers Overall transfer level: Needs assistance Equipment used: Rolling walker (2 wheeled) Transfers: Sit to/from Stand Sit to Stand: Min guard         General transfer comment: cues for hand placement and LE management.  Ambulation/Gait Ambulation/Gait assistance: Supervision;Min guard Ambulation Distance (Feet): 130 Feet (12') Assistive device: Rolling walker (2 wheeled) Gait Pattern/deviations: Step-through pattern;Step-to pattern;Antalgic Gait velocity: decr   General Gait Details: cues for sequence, posture and position from [employer]            Wheelchair Mobility    Modified Rankin (Stroke Patients Only)       Balance                                    Cognition Arousal/Alertness: Awake/alert Behavior During Therapy: WFL for tasks assessed/performed Overall Cognitive Status: Within Functional Limits for tasks assessed                      Exercises      General  Comments General comments (skin integrity, edema, etc.): min guard standing at the sink to wash hands.      Pertinent Vitals/Pain Pain Assessment: 0-10 Pain Score: 3  Pain Location: R knee Pain Descriptors / Indicators: Aching Pain Intervention(s): Limited activity within patient's tolerance;Monitored during session;Premedicated before session;Ice applied    Home Living Family/patient expects to be discharged to:: Private residence Living Arrangements: Spouse/significant other Available Help at Discharge: Family Type of Home: House Home Access: Stairs to enter Entrance Stairs-Rails: None Home Layout: One level Home Equipment: Environmental consultant - 2 wheels;Bedside commode;Tub bench;Adaptive equipment Additional Comments: Pt plans to stay with sister for several weeks    Prior Function Level of Independence: Independent;Independent with assistive device(s)          PT Goals (current goals can now be found in the care plan section) Acute Rehab PT Goals Patient Stated Goal: return to independence Time For Goal Achievement: 01/26/15 Potential to Achieve Goals: Good Progress towards PT goals: Progressing toward goals    Frequency  7X/week    PT Plan Current plan remains appropriate    Co-evaluation             End of Session Equipment Utilized During Treatment: Gait belt;Right knee immobilizer Activity Tolerance: Patient tolerated treatment well Patient left: in bed;with call bell/phone within reach     Time: 7253-6644 PT Time Calculation (min) (ACUTE ONLY): 14  min  Charges:  $Gait Training: 8-22 mins                    G Codes:      Carissa Musick 02-17-2015, 3:59 PM

## 2015-01-21 NOTE — Progress Notes (Signed)
Subjective: 2 Days Post-Op Procedure(s) (LRB): RIGHT TOTAL KNEE ARTHROPLASTY (Right) Patient reports pain as 4 on 0-10 scale.    Objective: Vital signs in last 24 hours: Temp:  [98.2 F (36.8 C)-99.8 F (37.7 C)] 99.8 F (37.7 C) (02/26 2128) Pulse Rate:  [89-92] 92 (02/26 2128) Resp:  [16-18] 18 (02/26 2128) BP: (156-160)/(64-73) 160/64 mmHg (02/26 2128) SpO2:  [97 %-99 %] 98 % (02/26 2128)  Intake/Output from previous day: 02/26 0701 - 02/27 0700 In: 960 [P.O.:360; I.V.:550; IV Piggyback:50] Out: 1600 [Urine:1600] Intake/Output this shift:     Recent Labs  01/20/15 0453 01/21/15 0426  HGB 12.5 12.4    Recent Labs  01/20/15 0453 01/21/15 0426  WBC 16.3* 13.6*  RBC 4.65 4.64  HCT 38.9 38.7  PLT 313 310    Recent Labs  01/18/15 0916 01/20/15 0453  NA 138 140  K 3.8 3.9  CL 106 105  CO2 24 24  BUN 10 8  CREATININE 0.76 0.71  GLUCOSE 95 148*  CALCIUM 9.9 9.2   No results for input(s): LABPT, INR in the last 72 hours.  Neurologically intact Neurovascular intact Dorsiflexion/Plantar flexion intact Incision: dressing C/D/I  Assessment/Plan: 2 Days Post-Op Procedure(s) (LRB): RIGHT TOTAL KNEE ARTHROPLASTY (Right) Advance diet Up with therapy Plan for discharge tomorrow  Slow progress [facility].   Taysha Majewski C 01/21/2015, 7:04 AM

## 2015-01-21 NOTE — Evaluation (Signed)
Occupational Therapy Evaluation Patient Details Name: TIEARRA COLWELL MRN: [id number] DOB: 12/15/1955 Today's Date: 01/21/2015    History of Present Illness R TKR   Clinical Impression   Pt sitting on commode when OT arrived. Pt didn't have KI on and educated pt on need to wear KI right now whenever up till she can SLR on the R. Placed KI on R LE and assisted pt back to bed. Also stood at the sink before exiting bathroom to stand at the sink and wash hands. Pt moving well but does need cues for safety with walker and hand placement. Will follow to progress ADL safety.    Follow Up Recommendations  No OT follow up;Supervision/Assistance - 24 hour    Equipment Recommendations  None recommended by OT    Recommendations for Other Services       Precautions / Restrictions Precautions Precautions: Knee;Fall Required Braces or Orthoses: Knee Immobilizer - Right Knee Immobilizer - Right: Discontinue once straight leg raise with < 10 degree lag Restrictions Weight Bearing Restrictions: No Other Position/Activity Restrictions: WBAT      Mobility Bed Mobility           Sit to supine: Min assist      Transfers Overall transfer level: Needs assistance Equipment used: Rolling walker (2 wheeled) Transfers: Sit to/from Stand Sit to Stand: Min guard         General transfer comment: cues for hand placement and LE management.    Balance                                            ADL Overall ADL's : Needs assistance/impaired Eating/Feeding: Independent;Sitting   Grooming: Wash/dry hands;Standing;Min guard   Upper Body Bathing: Set up;Sitting   Lower Body Bathing: Minimal assistance;Sit to/from stand   Upper Body Dressing : Set up;Sitting   Lower Body Dressing: Moderate assistance;Sit to/from stand   Toilet Transfer: Minimal assistance;Ambulation;BSC;RW   Toileting- Clothing Manipulation and Hygiene: Minimal assistance;Sit to/from stand          General ADL Comments: Pt states she has the tub transfer bench and is familiar with how to use it from her previous surgery as well as the AE. She declines need to practice with tub or AE. She does need occasional cues to reach back as she sits and also for rolling the walker and not picking it up.      Vision     Perception     Praxis      Pertinent Vitals/Pain Pain Assessment: 0-10 Pain Score: 4  Pain Location: R knee Pain Descriptors / Indicators: Aching Pain Intervention(s): Repositioned;Monitored during session     Hand Dominance     Extremity/Trunk Assessment Upper Extremity Assessment Upper Extremity Assessment: Overall WFL for tasks assessed           Communication Communication Communication: No difficulties   Cognition Arousal/Alertness: Awake/alert Behavior During Therapy: WFL for tasks assessed/performed Overall Cognitive Status: Within Functional Limits for tasks assessed                     General Comments       Exercises       Shoulder Instructions      Home Living Family/patient expects to be discharged to:: Private residence Living Arrangements: Spouse/significant other Available Help at Discharge: Family Type of Home: House Home Access: Stairs  to enter Entrance Stairs-Number of Steps: 2 Entrance Stairs-Rails: None Home Layout: One level     Bathroom Shower/Tub: Teacher, early years/pre: Standard     Home Equipment: Environmental consultant - 2 wheels;Bedside commode;Tub bench;Adaptive equipment Adaptive Equipment: Reacher;Sock aid;Long-handled shoe horn;Long-handled sponge Additional Comments: Pt plans to stay with sister for several weeks      Prior Functioning/Environment Level of Independence: Independent;Independent with assistive device(s)             OT Diagnosis: Generalized weakness   OT Problem List: Decreased strength;Decreased knowledge of use of DME or AE   OT Treatment/Interventions: Self-care/ADL  training;Patient/family education;Therapeutic activities;DME and/or AE instruction    OT Goals(Current goals can be found in the care plan section) Acute Rehab OT Goals Patient Stated Goal: return to independence OT Goal Formulation: With patient Time For Goal Achievement: 01/28/15 Potential to Achieve Goals: Good  OT Frequency: Min 2X/week   Barriers to D/C:            Co-evaluation              End of Session Equipment Utilized During Treatment: Rolling walker;Right knee immobilizer  Activity Tolerance: Patient tolerated treatment well Patient left: in bed;with call bell/phone within reach   Time: 1420-1440 OT Time Calculation (min): 20 min Charges:  OT General Charges $OT Visit: 1 Procedure OT Evaluation $Initial OT Evaluation Tier I: 1 Procedure G-Codes:    Jules Schick  [phone] 01/21/2015, 3:01 PM

## 2015-01-22 LAB — CBC
HCT: 38.3 % (ref 36.0–46.0)
Hemoglobin: 12.4 g/dL (ref 12.0–15.0)
MCH: 27.1 pg (ref 26.0–34.0)
MCHC: 32.4 g/dL (ref 30.0–36.0)
MCV: 83.8 fL (ref 78.0–100.0)
Platelets: 308 10*3/uL (ref 150–400)
RBC: 4.57 MIL/uL (ref 3.87–5.11)
RDW: 13.7 % (ref 11.5–15.5)
WBC: 11.7 10*3/uL — ABNORMAL HIGH (ref 4.0–10.5)

## 2015-01-22 NOTE — Plan of Care (Signed)
Problem: Phase I Progression Outcomes Goal: Pain controlled with appropriate interventions Outcome: Completed/Met Date Met:  01/22/15 Changed po meds to Hydrocodone which is more effective in pain control

## 2015-01-22 NOTE — Care Management Note (Signed)
CARE MANAGEMENT NOTE 01/22/2015  Patient:  Jade [name], Jade [name]   Account Number:  [id number]  Date Initiated:  01/19/2015  Documentation initiated by:  [facility]  Subjective/Objective Assessment:   adm: RIGHT TOTAL KNEE ARTHROPLASTY (Right)     Action/Plan:   discharge planning   Anticipated DC Date:  01/21/2015   Anticipated DC Plan:  [place]  CM consult      [facility] Choice  HOME HEALTH   Choice offered to / List presented to:  C-1 Patient        [place] arranged  [place] [facility]      [place].   Status of service:  Completed, signed off Medicare Important Message given?   (If response is "NO", the following Medicare IM given date fields will be blank) Date Medicare IM given:   Medicare IM given by:   Date Additional Medicare IM given:   Additional Medicare IM given by:    Discharge Disposition:  [place]  Per UR Regulation:    If discussed at Long Length of Stay Meetings, dates discussed:    Comments:  01/22/15 - Case reviewed. Venita Sheffield RN BSN CCM 01/22/15 [phone]  01/21/15  10:41  NCM spoke with patient and she says she already has all DME (walker, 3 in 1 and high chair) she needs at home from lasts year.  Patient also confirmed she wants [facility] for [facility] need, HH has been set up and confirmed with [place]. Marylyn Ishihara, RN, BSN, CCM  01/19/15 13:00 Cm spoke with pt who is refusing SNF and requests [facility] for HHPT.  Pt will be staying iwth her sister after discharge and will have the address and contact information at a later time (post-op and in pain).  CM will continue to follow for DME needs.  Referral called to [facility] rep, Kristen.  Mariane Masters, BSN, Cm [phone].

## 2015-01-22 NOTE — Progress Notes (Signed)
Physical Therapy Treatment Patient Details Name: Jade [name] MRN: [id number] DOB: 09-23-1956 Today's Date: 01/22/2015    History of Present Illness R TKR    PT Comments    Pt progressing well and eager for d/c.  Reviewed stairs and don/doff KI with pt and spouse  Follow Up Recommendations  Home health PT     Equipment Recommendations  None recommended by PT    Recommendations for Other Services OT consult     Precautions / Restrictions Precautions Precautions: Knee;Fall Required Braces or Orthoses: Knee Immobilizer - Right Knee Immobilizer - Right: Discontinue once straight leg raise with < 10 degree lag Restrictions Weight Bearing Restrictions: No Other Position/Activity Restrictions: WBAT    Mobility  Bed Mobility Overal bed mobility: Needs Assistance Bed Mobility: Supine to Sit;Sit to Supine     Supine to sit: Min guard Sit to supine: Min guard   General bed mobility comments: cues for sequence and use of L LE to self assist  Transfers Overall transfer level: Needs assistance Equipment used: Rolling walker (2 wheeled) Transfers: Sit to/from Stand Sit to Stand: Min guard         General transfer comment: cues for hand placement and LE management.  Ambulation/Gait Ambulation/Gait assistance: Min guard;Supervision Ambulation Distance (Feet): 111 Feet Assistive device: Rolling walker (2 wheeled) Gait Pattern/deviations: Step-to pattern;Decreased step length - right;Decreased step length - left;Shuffle;Trunk flexed Gait velocity: decr   General Gait Details: cues for sequence, posture and position from RW   Stairs Stairs: Yes Stairs assistance: Min assist Stair Management: No rails;Step to pattern;Forwards;Backwards;With walker Number of Stairs: 3 General stair comments: 1 step bkwd and single step twice fwd  Wheelchair Mobility    Modified Rankin (Stroke Patients Only)       Balance                                     Cognition Arousal/Alertness: Awake/alert Behavior During Therapy: WFL for tasks assessed/performed Overall Cognitive Status: Within Functional Limits for tasks assessed                      Exercises Total Joint Exercises Ankle Circles/Pumps: AROM;Right;Supine;15 reps Quad Sets: AAROM;Right;Supine;15 reps Heel Slides: AAROM;Right;Supine;20 reps Straight Leg Raises: AAROM;Right;Supine;20 reps    General Comments        Pertinent Vitals/Pain Pain Assessment: 0-10 Pain Score: 3  Pain Location: R knee Pain Descriptors / Indicators: Aching;Sore Pain Intervention(s): Limited activity within patient's tolerance;Monitored during session;Premedicated before session;Ice applied    Home Living                      Prior Function            PT Goals (current goals can now be found in the care plan section) Acute Rehab PT Goals Patient Stated Goal: return to independence PT Goal Formulation: With patient Time For Goal Achievement: 01/26/15 Potential to Achieve Goals: Good Progress towards PT goals: Progressing toward goals    Frequency  7X/week    PT Plan Current plan remains appropriate    Co-evaluation             End of Session Equipment Utilized During Treatment: Gait belt;Right knee immobilizer Activity Tolerance: Patient tolerated treatment well Patient left: in bed;with call bell/phone within reach     Time: 0953-1025 PT Time Calculation (min) (ACUTE ONLY): 32 min  Charges:  $  Gait Training: 8-22 mins $Therapeutic Exercise: 8-22 mins                    G Codes:      Jade [name] 2015-02-21, 12:44 PM

## 2015-01-22 NOTE — Discharge Summary (Signed)
Physician Discharge Summary   Patient ID: Jade [name] MRN: [id number] DOB/AGE: 61/13/1957 61 y.o.  Admit date: 01/19/2015 Discharge date: 01/22/2015  Primary Diagnosis: right knee DJD  Admission Diagnoses:  Past Medical History  Diagnosis Date  . GERD (gastroesophageal reflux disease)   . Benign microscopic hematuria     neg cystoscopy 11/12- dr. Janice Norrie  . Hypertension   . Hyperlipidemia   . Shortness of breath     with  exertion  . Numbness and tingling in left arm   . DJD (degenerative joint disease)     L KNEE  . Family history of anesthesia complication     multiple family members with history of this  . Malignant hyperthermia    Discharge Diagnoses:   Active Problems:   Right knee DJD  Estimated body mass index is 33.75 kg/(m^2) as calculated from the following:   Height as of this encounter: 5' 6"  (1.676 m).   Weight as of this encounter: 94.802 kg (209 lb).  Procedure:  Procedure(s) (LRB): RIGHT TOTAL KNEE ARTHROPLASTY (Right)   Consults: None  HPI: see H&P Laboratory Data: Admission on 01/19/2015, Discharged on 01/22/2015  Component Date Value Ref Range Status  . ABO/RH(D) 01/19/2015 O POS   Final  . Antibody Screen 01/19/2015 NEG   Final  . Sample Expiration 01/19/2015 01/22/2015   Final  . Glucose-Capillary 01/19/2015 101* 70 - 99 mg/dL Final  . Comment 1 01/19/2015 Notify RN   Final  . Comment 2 01/19/2015 Document in Chart   Final  . WBC 01/20/2015 16.3* 4.0 - 10.5 K/uL Final  . RBC 01/20/2015 4.65  3.87 - 5.11 MIL/uL Final  . Hemoglobin 01/20/2015 12.5  12.0 - 15.0 g/dL Final  . HCT 01/20/2015 38.9  36.0 - 46.0 % Final  . MCV 01/20/2015 83.7  78.0 - 100.0 fL Final  . MCH 01/20/2015 26.9  26.0 - 34.0 pg Final  . MCHC 01/20/2015 32.1  30.0 - 36.0 g/dL Final  . RDW 01/20/2015 13.7  11.5 - 15.5 % Final  . Platelets 01/20/2015 313  150 - 400 K/uL Final  . Sodium 01/20/2015 140  135 - 145 mmol/L Final  . Potassium 01/20/2015 3.9  3.5 - 5.1 mmol/L  Final  . Chloride 01/20/2015 105  96 - 112 mmol/L Final  . CO2 01/20/2015 24  19 - 32 mmol/L Final  . Glucose, Bld 01/20/2015 148* 70 - 99 mg/dL Final  . BUN 01/20/2015 8  6 - 23 mg/dL Final  . Creatinine, Ser 01/20/2015 0.71  0.50 - 1.10 mg/dL Final  . Calcium 01/20/2015 9.2  8.4 - 10.5 mg/dL Final  . GFR calc non Af Amer 01/20/2015 >90  >90 mL/min Final  . GFR calc Af Amer 01/20/2015 >90  >90 mL/min Final   Comment: (NOTE) The eGFR has been calculated using the CKD EPI equation. This calculation has not been validated in all clinical situations. eGFR's persistently <90 mL/min signify possible Chronic Kidney Disease.   . Anion gap 01/20/2015 11  5 - 15 Final  . WBC 01/21/2015 13.6* 4.0 - 10.5 K/uL Final  . RBC 01/21/2015 4.64  3.87 - 5.11 MIL/uL Final  . Hemoglobin 01/21/2015 12.4  12.0 - 15.0 g/dL Final  . HCT 01/21/2015 38.7  36.0 - 46.0 % Final  . MCV 01/21/2015 83.4  78.0 - 100.0 fL Final  . MCH 01/21/2015 26.7  26.0 - 34.0 pg Final  . MCHC 01/21/2015 32.0  30.0 - 36.0 g/dL Final  . RDW  01/21/2015 13.8  11.5 - 15.5 % Final  . Platelets 01/21/2015 310  150 - 400 K/uL Final  . WBC 01/22/2015 11.7* 4.0 - 10.5 K/uL Final  . RBC 01/22/2015 4.57  3.87 - 5.11 MIL/uL Final  . Hemoglobin 01/22/2015 12.4  12.0 - 15.0 g/dL Final  . HCT 01/22/2015 38.3  36.0 - 46.0 % Final  . MCV 01/22/2015 83.8  78.0 - 100.0 fL Final  . MCH 01/22/2015 27.1  26.0 - 34.0 pg Final  . MCHC 01/22/2015 32.4  30.0 - 36.0 g/dL Final  . RDW 01/22/2015 13.7  11.5 - 15.5 % Final  . Platelets 01/22/2015 308  150 - 400 K/uL Final  Lab on 01/18/2015  Component Date Value Ref Range Status  . Sodium 01/18/2015 138  135 - 145 mEq/L Final  . Potassium 01/18/2015 3.8  3.5 - 5.1 mEq/L Final  . Chloride 01/18/2015 106  96 - 112 mEq/L Final  . CO2 01/18/2015 24  19 - 32 mEq/L Final  . Glucose, Bld 01/18/2015 95  70 - 99 mg/dL Final  . BUN 01/18/2015 10  6 - 23 mg/dL Final  . Creatinine, Ser 01/18/2015 0.76  0.40 - 1.20  mg/dL Final  . Calcium 01/18/2015 9.9  8.4 - 10.5 mg/dL Final  . GFR 01/18/2015 99.68  >60.00 mL/min Final  Hospital Outpatient Visit on 01/13/2015  Component Date Value Ref Range Status  . MRSA, PCR 01/13/2015 NEGATIVE  NEGATIVE Final  . Staphylococcus aureus 01/13/2015 NEGATIVE  NEGATIVE Final   Comment:        The Xpert SA Assay (FDA approved for NASAL specimens in patients over 44 years of age), is one component of a comprehensive surveillance program.  Test performance has been validated by [facility] for patients greater than or equal to 54 year old. It is not intended to diagnose infection nor to guide or monitor treatment.   . Sodium 01/13/2015 140  135 - 145 mmol/L Final  . Potassium 01/13/2015 3.4* 3.5 - 5.1 mmol/L Final  . Chloride 01/13/2015 104  96 - 112 mmol/L Final  . CO2 01/13/2015 26  19 - 32 mmol/L Final  . Glucose, Bld 01/13/2015 99  70 - 99 mg/dL Final  . BUN 01/13/2015 7  6 - 23 mg/dL Final  . Creatinine, Ser 01/13/2015 0.69  0.50 - 1.10 mg/dL Final  . Calcium 01/13/2015 9.4  8.4 - 10.5 mg/dL Final  . GFR calc non Af Amer 01/13/2015 >90  >90 mL/min Final  . GFR calc Af Amer 01/13/2015 >90  >90 mL/min Final   Comment: (NOTE) The eGFR has been calculated using the CKD EPI equation. This calculation has not been validated in all clinical situations. eGFR's persistently <90 mL/min signify possible Chronic Kidney Disease.   . Anion gap 01/13/2015 10  5 - 15 Final  . WBC 01/13/2015 9.3  4.0 - 10.5 K/uL Final  . RBC 01/13/2015 4.93  3.87 - 5.11 MIL/uL Final  . Hemoglobin 01/13/2015 13.4  12.0 - 15.0 g/dL Final  . HCT 01/13/2015 41.0  36.0 - 46.0 % Final  . MCV 01/13/2015 83.2  78.0 - 100.0 fL Final  . MCH 01/13/2015 27.2  26.0 - 34.0 pg Final  . MCHC 01/13/2015 32.7  30.0 - 36.0 g/dL Final  . RDW 01/13/2015 13.7  11.5 - 15.5 % Final  . Platelets 01/13/2015 341  150 - 400 K/uL Final  . Prothrombin Time 01/13/2015 13.3  11.6 - 15.2 seconds Final  . INR  01/13/2015 1.00  0.00 - 1.49 Final  . Color, Urine 01/13/2015 YELLOW  YELLOW Final  . APPearance 01/13/2015 CLEAR  CLEAR Final  . Specific Gravity, Urine 01/13/2015 1.004* 1.005 - 1.030 Final  . pH 01/13/2015 6.0  5.0 - 8.0 Final  . Glucose, UA 01/13/2015 NEGATIVE  NEGATIVE mg/dL Final  . Hgb urine dipstick 01/13/2015 NEGATIVE  NEGATIVE Final  . Bilirubin Urine 01/13/2015 NEGATIVE  NEGATIVE Final  . Ketones, ur 01/13/2015 NEGATIVE  NEGATIVE mg/dL Final  . Protein, ur 01/13/2015 NEGATIVE  NEGATIVE mg/dL Final  . Urobilinogen, UA 01/13/2015 0.2  0.0 - 1.0 mg/dL Final  . Nitrite 01/13/2015 NEGATIVE  NEGATIVE Final  . Leukocytes, UA 01/13/2015 NEGATIVE  NEGATIVE Final   MICROSCOPIC NOT DONE ON URINES WITH NEGATIVE PROTEIN, BLOOD, LEUKOCYTES, NITRITE, OR GLUCOSE <1000 mg/dL.  Marland Kitchen aPTT 01/13/2015 32  24 - 37 seconds Final  Office Visit on 01/11/2015  Component Date Value Ref Range Status  . Sodium 01/11/2015 141  135 - 145 mEq/L Final  . Potassium 01/11/2015 3.3* 3.5 - 5.1 mEq/L Final  . Chloride 01/11/2015 108  96 - 112 mEq/L Final  . CO2 01/11/2015 27  19 - 32 mEq/L Final  . Glucose, Bld 01/11/2015 117* 70 - 99 mg/dL Final  . BUN 01/11/2015 10  6 - 23 mg/dL Final  . Creatinine, Ser 01/11/2015 0.91  0.40 - 1.20 mg/dL Final  . Calcium 01/11/2015 9.2  8.4 - 10.5 mg/dL Final  . GFR 01/11/2015 80.98  >60.00 mL/min Final  . Color, UA 01/11/2015 yellow   Final  . Clarity, UA 01/11/2015 clear   Final  . Glucose, UA 01/11/2015 negative   Final  . Bilirubin, UA 01/11/2015 1+   Final  . Ketones, UA 01/11/2015 1+   Final  . Spec Grav, UA 01/11/2015 >=1.030   Final  . Blood, UA 01/11/2015 1+   Final  . pH, UA 01/11/2015 6.0   Final  . Protein, UA 01/11/2015 1+   Final  . Urobilinogen, UA 01/11/2015 negative   Final  . Nitrite, UA 01/11/2015 negative   Final  . Leukocytes, UA 01/11/2015 Negative   Final  . Colony Count 01/11/2015 NO GROWTH   Final  . Organism ID, Bacteria 01/11/2015 NO GROWTH    Final  Lab on 12/20/2014  Component Date Value Ref Range Status  . Cholesterol 12/20/2014 223* 0 - 200 mg/dL Final   ATP III Classification       Desirable:  < 200 mg/dL               Borderline High:  200 - 239 mg/dL          High:  > = 240 mg/dL  . Triglycerides 12/20/2014 223.0* 0.0 - 149.0 mg/dL Final   Normal:  <150 mg/dLBorderline High:  150 - 199 mg/dL  . HDL 12/20/2014 53.70  >39.00 mg/dL Final  . VLDL 12/20/2014 44.6* 0.0 - 40.0 mg/dL Final  . Total CHOL/HDL Ratio 12/20/2014 4   Final                  Men          Women1/2 Average Risk     3.4          3.3Average Risk          5.0          4.42X Average Risk          9.6  7.13X Average Risk          15.0          11.0                      . NonHDL 12/20/2014 169.30   Final   NOTE:  Non-HDL goal should be 30 mg/dL higher than patient's LDL goal (i.e. LDL goal of < 70 mg/dL, would have non-HDL goal of < 100 mg/dL)  . Direct LDL 12/20/2014 154.0   Final   Optimal:  <100 mg/dLNear or Above Optimal:  100-129 mg/dLBorderline High:  130-159 mg/dLHigh:  160-189 mg/dLVery High:  >190 mg/dL     X-Rays:Dg Chest 2 View  01/13/2015   CLINICAL DATA:  Congestion, nonproductive cough, 19.5 pack years of tobacco use  EXAM: CHEST  2 VIEW  COMPARISON:  PA and lateral chest x-ray of January 11, 2015 and September 30, 2014  FINDINGS: The lungs are adequately inflated. There is no focal infiltrate. The perihilar lung markings are mildly prominent though stable. There is no pleural effusion or pneumothorax. The heart and pulmonary vascularity are normal. There is mild tortuosity of the descending thoracic aorta. There is multilevel degenerative disc disease of the thoracic spine.  IMPRESSION: There is no evidence of pneumonia, CHF, nor other active cardiopulmonary disease. Mild pulmonary interstitial prominence bilaterally likely reflects the patient's smoking history.   Electronically Signed   By: David  Martinique   On: 01/13/2015 15:28   Dg Chest 2  View  01/11/2015   CLINICAL DATA:  Chest congestion for 2 weeks.  EXAM: CHEST  2 VIEW  COMPARISON:  None.  FINDINGS: Mediastinum and hilar structures are normal. Mild interstitial prominence noted bilaterally suggesting pneumonitis. No pleural effusion or pneumothorax. Heart size normal.  IMPRESSION: Mild bilateral pulmonary interstitial prominence suggesting pneumonitis.   Electronically Signed   By: Marcello Moores  Register   On: 01/11/2015 16:01   Dg Knee 1-2 Views Right  01/13/2015   CLINICAL DATA:  Preop for right total knee joint replacement, known degenerative change  EXAM: RIGHT KNEE - 1-2 VIEW  COMPARISON:  None.  FINDINGS: There is severe narrowing of the medial joint compartment. There is no eburnation of the articular surfaces. There is minimal chondrocalcinosis of the lateral meniscus. There is beaking of the tibial spines. There is a large spur from the superior articular margin of the patella. There is no joint effusion.  IMPRESSION: There is severe joint space loss involving the medial joint compartment. There are milder osteoarthritic changes in the lateral and patellofemoral compartments. There is no acute bony abnormality.   Electronically Signed   By: David  Martinique   On: 01/13/2015 15:26   Dg Knee Right Port  01/19/2015   CLINICAL DATA:  Status post right total knee replacement.  EXAM: PORTABLE RIGHT KNEE - 1-2 VIEW  COMPARISON:  January 13, 2015.  FINDINGS: Status post right total knee arthroplasty. The femoral and tibial components appear to be well situated. Postsurgical changes are noted in the anterior soft tissues of the knee.  IMPRESSION: Status post right total knee arthroplasty These results were called by telephone at the time of interpretation on 01/19/2015 at 10:33 am to [facility], the patient's nurse in PACU, who verbally acknowledged these results.   Electronically Signed   By: Marijo Conception, M.D.   On: 01/19/2015 10:34    EKG: Orders placed or performed in visit on  09/30/14  . EKG [facility]  Course: JAHNIYA DUZAN is a 59 y.o. who was admitted to [facility]. They were brought to the operating room on 01/19/2015 and underwent Procedure(s): RIGHT TOTAL KNEE ARTHROPLASTY.  Patient tolerated the procedure well and was later transferred to the recovery room and then to the orthopaedic floor for postoperative care.  They were given PO and IV analgesics for pain control following their surgery.  They were given 24 hours of postoperative antibiotics of  Anti-infectives    Start     Dose/Rate Route Frequency Ordered Stop   01/19/15 1400  ceFAZolin (ANCEF) IVPB 2 g/50 mL premix     2 g 100 mL/hr over 30 Minutes Intravenous Every 6 hours 01/19/15 1149 01/20/15 0225   01/19/15 0818  polymyxin B 500,000 Units, bacitracin 50,000 Units in sodium chloride irrigation 0.9 % 500 mL irrigation  Status:  Discontinued       As needed 01/19/15 0818 01/19/15 0956   01/19/15 0600  ceFAZolin (ANCEF) IVPB 2 g/50 mL premix     2 g 100 mL/hr over 30 Minutes Intravenous On call to O.R. 01/19/15 1610 01/19/15 0745     and started on DVT prophylaxis in the form of Xarelto, TED hose and SCD's.   PT and OT were ordered for total joint protocol.  Discharge planning consulted to help with postop disposition and equipment needs.  Patient had a fair night on the evening of surgery.  They started to get up OOB with therapy on day one.  Continued to work with therapy into day two.  By day three, the patient had progressed with therapy and meeting their goals.  Incision was healing well.  Patient was seen in rounds and was ready to go home.   Diet: Regular diet Activity:WBAT Follow-up:in 10-14 days Disposition - Home Discharged Condition: good      Medication List    STOP taking these medications        meloxicam 7.5 MG tablet  Commonly known as:  MOBIC      TAKE these medications        amLODipine 10 MG tablet  Commonly known as:  NORVASC  TAKE 1 TABLET  BY MOUTH EVERY MORNING.     docusate sodium 100 MG capsule  Commonly known as:  COLACE  Take 1 capsule (100 mg total) by mouth 2 (two) times daily as needed for mild constipation.     gabapentin 300 MG capsule  Commonly known as:  NEURONTIN  Takes 1 in the morning 1 at noon and 2 at bedtime     guaiFENesin 600 MG 12 hr tablet  Commonly known as:  MUCINEX  Take 1 mg by mouth 2 (two) times daily.     Linaclotide 145 MCG Caps capsule  Commonly known as:  LINZESS  Take 1 capsule (145 mcg total) by mouth daily.     methocarbamol 500 MG tablet  Commonly known as:  ROBAXIN  Take 1 tablet (500 mg total) by mouth 3 (three) times daily between meals as needed for muscle spasms.     omeprazole 40 MG capsule  Commonly known as:  PRILOSEC  TAKE 1 CAPSULE BY MOUTH DAILY.     oxyCODONE-acetaminophen 5-325 MG per tablet  Commonly known as:  PERCOCET  Take 1 tablet by mouth every 4 (four) hours as needed.     potassium chloride SA 20 MEQ tablet  Commonly known as:  K-DUR,KLOR-CON  Take 1 tablet (20 mEq total) by mouth daily.  rivaroxaban 10 MG Tabs tablet  Commonly known as:  XARELTO  Take 1 tablet (10 mg total) by mouth daily.     rosuvastatin 20 MG tablet  Commonly known as:  CRESTOR  Take 1 tablet (20 mg total) by mouth daily.           Follow-up Information    Follow up with BEANE,JEFFREY C, MD In 2 weeks.   Specialty:  Orthopedic Surgery   Why:  For suture removal   Contact information:   [address] [place] [address] [phone]       Follow up with [facility].   Why:  home health physical therapy   Contact information:   [place] [address] [phone]       Signed: Lacie Draft, PA-C Orthopaedic Surgery 01/22/2015, 8:03 PM

## 2015-01-22 NOTE — Progress Notes (Signed)
Jade [name]  MRN: [id number] DOB/Age: November 29, 1955 59 y.o. Physician: Ander Slade, M.D. 3 Days Post-Op Procedure(s) (LRB): RIGHT TOTAL KNEE ARTHROPLASTY (Right)  Subjective: Rested well last night, good appetite Vital Signs Temp:  [98.4 F (36.9 C)-99.3 F (37.4 C)] 98.8 F (37.1 C) (02/28 0417) Pulse Rate:  [81-92] 92 (02/28 0417) Resp:  [16] 16 (02/28 0417) BP: (130-150)/(63-81) 130/73 mmHg (02/28 0417) SpO2:  [100 %] 100 % (02/28 0417)  Lab Results  Recent Labs  01/21/15 0426 01/22/15 0455  WBC 13.6* 11.7*  HGB 12.4 12.4  HCT 38.7 38.3  PLT 310 308   BMET  Recent Labs  01/20/15 0453  NA 140  K 3.9  CL 105  CO2 24  GLUCOSE 148*  BUN 8  CREATININE 0.71  CALCIUM 9.2   INR  Date Value Ref Range Status  01/13/2015 1.00 0.00 - 1.49 Final     Exam  Dressing dry, fair quad tone, n/v intact  Plan D/c home today, TKA protocol  Vasilia Dise M 01/22/2015, 9:10 AM

## 2015-01-23 NOTE — Progress Notes (Signed)
Discharge summary sent to payer through [facility]  

## 2015-02-06 ENCOUNTER — Telehealth: Payer: Self-pay | Admitting: Family

## 2015-02-06 NOTE — Telephone Encounter (Signed)
Caller name: Cory, Rama Relation to pt: self  Call back number: [phone]   Reason for call:  Dr Maxie Better knee surgeon advised pt to take multi vitamin. Pt in need of clinical advice.

## 2015-02-06 NOTE — Telephone Encounter (Signed)
Notified pt and she voices understanding. 

## 2015-02-06 NOTE — Telephone Encounter (Signed)
I recommend centrum.

## 2015-02-06 NOTE — Telephone Encounter (Signed)
Spoke with pt.  She wants to know what multivitamin you recommend with iron as this is what Dr Maxie Better advised her to start?

## 2015-02-23 ENCOUNTER — Other Ambulatory Visit: Payer: Self-pay | Admitting: Family

## 2015-03-28 ENCOUNTER — Other Ambulatory Visit: Payer: Self-pay | Admitting: Family Medicine

## 2015-03-28 NOTE — Telephone Encounter (Signed)
Please advise refills:   Medication name:  Name from pharmacy:  meloxicam (MOBIC) 7.5 MG tablet MELOXICAM 7.5 MG TABLET 7.5 MG TAB    Sig: TAKE 1 TABLET BY MOUTH DAILY.    Dispense: 30 tablet   Refills: 0   Start: 03/28/2015   Class: Normal    Requested on: 02/23/2015    Originally ordered on: 03/04/2014 02/23/2015 Order History and Details           Medication name:  Name from pharmacy:  methocarbamol (ROBAXIN) 500 MG tablet METHOCARBAMOL 500 MG TABLET 500 MG TAB    Sig: TAKE 1 TABLET BY MOUTH 3 TIMES A DAY BETWEEN MEALS AS NEEDED FOR MUSCLE SPASMS.    Dispense: 40 tablet   Refills: 0   Start: 03/28/2015   Class: Normal    Requested on: 02/23/2015    Originally ordered on: 01/20/2014 02/23/2015

## 2015-03-29 NOTE — Telephone Encounter (Signed)
Please let pt know that I sent 1 refill of meloxicam. We should not continue long term due to her being on xarelto. She should try to transition from meloxicam to tylenol. I will defer robaxin refills to Dr. Maxie Better her ortho doctor.

## 2015-03-29 NOTE — Telephone Encounter (Signed)
Notified pt and she voices understanding. Pt requests that f/u on 04/11/15 be r/s to the pm as she has appt with ortho at 10:30 in the am. Appt r/s for same day at 1:45pm.

## 2015-04-11 ENCOUNTER — Encounter: Payer: Self-pay | Admitting: Family

## 2015-04-11 ENCOUNTER — Ambulatory Visit: Payer: Medicare HMO | Admitting: Family

## 2015-04-11 ENCOUNTER — Ambulatory Visit (INDEPENDENT_AMBULATORY_CARE_PROVIDER_SITE_OTHER): Payer: Medicare HMO | Admitting: Family

## 2015-04-11 VITALS — BP 130/70 | HR 87 | Temp 97.5°F | Resp 16 | Ht 66.0 in | Wt 214.8 lb

## 2015-04-11 DIAGNOSIS — K589 Irritable bowel syndrome without diarrhea: Secondary | ICD-10-CM

## 2015-04-11 DIAGNOSIS — Z1231 Encounter for screening mammogram for malignant neoplasm of breast: Secondary | ICD-10-CM

## 2015-04-11 DIAGNOSIS — I10 Essential (primary) hypertension: Secondary | ICD-10-CM | POA: Diagnosis not present

## 2015-04-11 DIAGNOSIS — E785 Hyperlipidemia, unspecified: Secondary | ICD-10-CM

## 2015-04-11 DIAGNOSIS — R739 Hyperglycemia, unspecified: Secondary | ICD-10-CM | POA: Diagnosis not present

## 2015-04-11 DIAGNOSIS — R7303 Prediabetes: Secondary | ICD-10-CM

## 2015-04-11 DIAGNOSIS — Z1239 Encounter for other screening for malignant neoplasm of breast: Secondary | ICD-10-CM

## 2015-04-11 DIAGNOSIS — R7309 Other abnormal glucose: Secondary | ICD-10-CM

## 2015-04-11 MED ORDER — LINACLOTIDE 145 MCG PO CAPS: 145.0000 ug | ORAL_CAPSULE | Freq: Every day | ORAL | Status: DC

## 2015-04-11 MED ORDER — GABAPENTIN 300 MG PO CAPS: ORAL_CAPSULE | ORAL | Status: DC

## 2015-04-11 MED ORDER — AMLODIPINE BESYLATE 10 MG PO TABS: 10.0000 mg | ORAL_TABLET | Freq: Every morning | ORAL | Status: DC

## 2015-04-11 MED ORDER — ROSUVASTATIN CALCIUM 20 MG PO TABS: 20.0000 mg | ORAL_TABLET | Freq: Every day | ORAL | Status: DC

## 2015-04-11 MED ORDER — POTASSIUM CHLORIDE CRYS ER 20 MEQ PO TBCR: 20.0000 meq | EXTENDED_RELEASE_TABLET | Freq: Every day | ORAL | Status: DC

## 2015-04-11 NOTE — Assessment & Plan Note (Signed)
Obtain follow up lipid panel, continue crestor.

## 2015-04-11 NOTE — Progress Notes (Signed)
Subjective:    Patient ID: Jade [name], female    DOB: Jun 17, 1956, 59 y.o.   MRN: [id number]  HPI   Jade [name] is a 59 yr old female who presents today for follow up.  Patient presents today for follow up of multiple medical problems.  Hyperglycemia   Lab Results  Component Value Date   HGBA1C 6.1 08/29/2014   HGBA1C 6.4* 05/30/2014   HGBA1C 6.1* 03/04/2014    Lab Results  Component Value Date   LDLCALC 119* 10/18/2014   CREATININE 0.71 01/20/2015   Hyperlipidemia  Patient is currently maintained on the following medication for hyperlipidemia: crestor 20mg  Last lipid panel as follows:  Lab Results  Component Value Date   CHOL 223* 12/20/2014   HDL 53.70 12/20/2014   LDLCALC 119* 10/18/2014   LDLDIRECT 154.0 12/20/2014   TRIG 223.0* 12/20/2014   CHOLHDL 4 12/20/2014   Patient denies myalgia. Patient reports good compliance with low fat/low cholesterol diet.   IBS  Maintained on linzess.  Reports that she has daily BM's on linzess.   Hypertension  Patient is currently maintained on the following medications for blood pressure: amlodipine.  Patient reports good compliance with blood pressure medications. Patient denies chest pain, shortness of breath.  Notes mild LE edema bilaterally Last 3 blood pressure readings in our office are as follows: BP Readings from Last 3 Encounters:  04/11/15 130/70  01/22/15 130/73  01/11/15 138/70    Review of Systems See HPI  Past Medical History  Diagnosis Date  . GERD (gastroesophageal reflux disease)   . Benign microscopic hematuria     neg cystoscopy 11/12- dr. Janice Norrie  . Hypertension   . Hyperlipidemia   . Shortness of breath     with  exertion  . Numbness and tingling in left arm   . DJD (degenerative joint disease)     L KNEE  . Family history of anesthesia complication     multiple family members with history of this  . Malignant hyperthermia     History   Social History  . Marital Status:  Married    Spouse Name: N/A  . Number of Children: 3  . Years of Education: N/A   Occupational History  . Not on file.   Social History Main Topics  . Smoking status: Current Every Day Smoker -- 0.50 packs/day for 39 years    Types: Cigarettes  . Smokeless tobacco: Never Used     Comment: 1 1/2 cigarettes daily  . Alcohol Use: No  . Drug Use: No  . Sexual Activity: Not on file   Other Topics Concern  . Not on file   Social History Narrative   Regular exercise:  No   Caffeine Use:  2 cups coffee and 3-4 glasses of soda daily.   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    Past Surgical History  Procedure Laterality Date  . Knee cartilage surgery  1999    right knee  . Abdominal hysterectomy  1996  . Cervical disc surgery  2013    Dr. Arnoldo Morale  . Rotator cuff repair Right 05/25/13  . Tubal ligation    . Total knee arthroplasty Left 01/20/2014    Procedure: LEFT TOTAL KNEE ARTHROPLASTY;  Surgeon: Johnn Hai, MD;  Location: [facility];  Service: Orthopedics;  Laterality: Left;  . Dilation and curettage of uterus    . Total knee arthroplasty  Right 01/19/2015    Procedure: RIGHT TOTAL KNEE ARTHROPLASTY;  Surgeon: Johnn Hai, MD;  Location: [facility];  Service: Orthopedics;  Laterality: Right;    Family History  Problem Relation Age of Onset  . Hypertension Mother   . Hyperlipidemia Mother   . Heart disease Father     CAD; stent at age 69  . Diabetes Father   . Hypertension Father   . Hyperlipidemia Father   . Cancer Father     prostate  . Cancer Paternal Aunt     BREAST    Allergies  Allergen Reactions  . Shrimp [Shellfish Allergy] Itching    Rash and lips itching  . Ace Inhibitors     Cough  . Aspirin Nausea And Vomiting  . Azithromycin Rash    Current Outpatient Prescriptions on File Prior to Visit  Medication Sig Dispense Refill  . amLODipine (NORVASC) 10 MG tablet TAKE 1 TABLET BY MOUTH EVERY  MORNING 30 tablet 5  . gabapentin (NEURONTIN) 300 MG capsule TAKE 1 CAPSULE BY MOUTH IN THE MORNING, 1 AT NOON, AND 2 AT BEDTIME 120 capsule 2  . Linaclotide (LINZESS) 145 MCG CAPS capsule Take 1 capsule (145 mcg total) by mouth daily. 30 capsule 5  . meloxicam (MOBIC) 7.5 MG tablet TAKE 1 TABLET BY MOUTH DAILY. 30 tablet 0  . methocarbamol (ROBAXIN) 500 MG tablet Take 1 tablet (500 mg total) by mouth 3 (three) times daily between meals as needed for muscle spasms. (Patient taking differently: Take 500 mg by mouth 2 (two) times daily. ) 40 tablet 1  . omeprazole (PRILOSEC) 40 MG capsule TAKE 1 CAPSULE BY MOUTH DAILY 30 capsule 5  . oxyCODONE-acetaminophen (PERCOCET) 5-325 MG per tablet Take 1 tablet by mouth every 4 (four) hours as needed. 60 tablet 0  . potassium chloride SA (K-DUR,KLOR-CON) 20 MEQ tablet Take 1 tablet (20 mEq total) by mouth daily. 30 tablet 3  . rosuvastatin (CRESTOR) 20 MG tablet Take 1 tablet (20 mg total) by mouth daily. 30 tablet 2   No current facility-administered medications on file prior to visit.    BP 130/70 mmHg  Pulse 87  Temp(Src) 97.5 F (36.4 C) (Oral)  Resp 16  Ht 5\' 6"  (1.676 m)  Wt 214 lb 12.8 oz (97.433 kg)  BMI 34.69 kg/m2  SpO2 99%  LMP 11/25/1994       Objective:   Physical Exam  Constitutional: She is oriented to person, place, and time. She appears well-developed and well-nourished.  HENT:  Head: Normocephalic and atraumatic.  Cardiovascular: Normal rate, regular rhythm and normal heart sounds.   No murmur heard. LLE 1+ edema RLE 1-2+ edema  Pulmonary/Chest: Effort normal and breath sounds normal. No respiratory distress. She has no wheezes.  Lymphadenopathy:    She has no cervical adenopathy.  Neurological: She is alert and oriented to person, place, and time.  Skin: Skin is warm and dry.  Psychiatric: She has a normal mood and affect. Her behavior is normal. Judgment and thought content normal.          Assessment & Plan:

## 2015-04-11 NOTE — Assessment & Plan Note (Signed)
Stable on Linzess.  Continue same.

## 2015-04-11 NOTE — Assessment & Plan Note (Signed)
Obtain a1c.  

## 2015-04-11 NOTE — Patient Instructions (Addendum)
Please complete lab work prior to leaving. You will be contacted about your mammogram, call if have not heard back in 1 week. Follow up in 4 months.

## 2015-04-11 NOTE — Assessment & Plan Note (Signed)
Stable on amlodipine, continue same, obtain bmet.

## 2015-04-11 NOTE — Assessment & Plan Note (Signed)
>>  ASSESSMENT AND PLAN FOR DYSLIPIDEMIA WRITTEN ON 04/11/2015  2:26 PM BY O'SULLIVAN, Peace Jost, NP  Obtain follow up lipid panel, continue crestor .

## 2015-04-11 NOTE — Progress Notes (Signed)
Pre visit review using our clinic review tool, if applicable. No additional management support is needed unless otherwise documented below in the visit note. 

## 2015-04-12 LAB — BASIC METABOLIC PANEL
BUN: 10 mg/dL (ref 6–23)
CO2: 26 mEq/L (ref 19–32)
Calcium: 9.6 mg/dL (ref 8.4–10.5)
Chloride: 107 mEq/L (ref 96–112)
Creatinine, Ser: 0.69 mg/dL (ref 0.40–1.20)
GFR: 111.35 mL/min (ref >60.00)
Glucose, Bld: 100 mg/dL — ABNORMAL HIGH (ref 70–99)
Potassium: 3.4 mEq/L — ABNORMAL LOW (ref 3.5–5.1)
Sodium: 140 mEq/L (ref 135–145)

## 2015-04-12 LAB — LIPID PANEL
Cholesterol: 236 mg/dL — ABNORMAL HIGH (ref 0–200)
HDL: 52.4 mg/dL (ref >39.00)
LDL Cholesterol: 154 mg/dL — ABNORMAL HIGH (ref 0–99)
NonHDL: 183.6
Total CHOL/HDL Ratio: 5
Triglycerides: 150 mg/dL — ABNORMAL HIGH (ref 0.0–149.0)
VLDL: 30 mg/dL (ref 0.0–40.0)

## 2015-04-12 LAB — HEMOGLOBIN A1C: Hgb A1c MFr Bld: 6.1 % (ref 4.6–6.5)

## 2015-04-14 ENCOUNTER — Telehealth: Payer: Self-pay | Admitting: Family

## 2015-04-14 NOTE — Telephone Encounter (Signed)
Cholesterol is elevated.  Is she taking crestor regularly? If so, then we need to increase from 20mg  to 40mg  and repeat FLP in 6 weeks. Sugar is stable. Potassium is a little low. Is she taking Kdur?  If so we should increase kdur from 7meq once daily to 73meq bid and repeat bmet in 1 week.

## 2015-04-14 NOTE — Telephone Encounter (Signed)
Notified pt. She has only been taking Crestor about 3 times a week (can't remember to take it). Has not been taking potassium but will restart it. Advised pt to change time of day to take Crestor to help her remember to take it on a regular basis.

## 2015-04-14 NOTE — Telephone Encounter (Signed)
Noted  

## 2015-04-17 ENCOUNTER — Ambulatory Visit (HOSPITAL_BASED_OUTPATIENT_CLINIC_OR_DEPARTMENT_OTHER)
Admission: RE | Admit: 2015-04-17 | Discharge: 2015-04-17 | Disposition: A | Discharge status: Home / Self Care | Payer: Medicare HMO | Source: Ambulatory Visit | Attending: Family | Admitting: Family

## 2015-04-17 DIAGNOSIS — Z1231 Encounter for screening mammogram for malignant neoplasm of breast: Secondary | ICD-10-CM | POA: Insufficient documentation

## 2015-04-17 DIAGNOSIS — R928 Other abnormal and inconclusive findings on diagnostic imaging of breast: Secondary | ICD-10-CM | POA: Insufficient documentation

## 2015-04-17 DIAGNOSIS — Z1239 Encounter for other screening for malignant neoplasm of breast: Secondary | ICD-10-CM

## 2015-04-18 ENCOUNTER — Telehealth: Payer: Self-pay | Admitting: Family

## 2015-04-18 ENCOUNTER — Other Ambulatory Visit: Payer: Self-pay | Admitting: Family

## 2015-04-18 DIAGNOSIS — R928 Other abnormal and inconclusive findings on diagnostic imaging of breast: Secondary | ICD-10-CM

## 2015-04-18 NOTE — Telephone Encounter (Signed)
Please advise pt that the radiologist would like some additional breast imaging.  Please let me know if she has not been contacted in 1 week about scheduling the additional imaging.

## 2015-04-18 NOTE — Telephone Encounter (Signed)
Notified pt and she voices understanding. 

## 2015-04-20 ENCOUNTER — Ambulatory Visit
Admission: RE | Admit: 2015-04-20 | Discharge: 2015-04-20 | Disposition: A | Discharge status: Home / Self Care | Payer: BLUE CROSS/BLUE SHIELD | Source: Ambulatory Visit | Attending: Family | Admitting: Family

## 2015-04-20 ENCOUNTER — Other Ambulatory Visit: Payer: Self-pay | Admitting: Family

## 2015-04-20 ENCOUNTER — Encounter (INDEPENDENT_AMBULATORY_CARE_PROVIDER_SITE_OTHER): Payer: Self-pay

## 2015-04-20 DIAGNOSIS — R928 Other abnormal and inconclusive findings on diagnostic imaging of breast: Secondary | ICD-10-CM

## 2015-05-08 ENCOUNTER — Ambulatory Visit
Admission: RE | Admit: 2015-05-08 | Discharge: 2015-05-08 | Disposition: A | Discharge status: Home / Self Care | Payer: BLUE CROSS/BLUE SHIELD | Source: Ambulatory Visit | Attending: Family | Admitting: Family

## 2015-05-08 DIAGNOSIS — R928 Other abnormal and inconclusive findings on diagnostic imaging of breast: Secondary | ICD-10-CM

## 2015-06-07 ENCOUNTER — Telehealth: Payer: Self-pay

## 2015-06-07 NOTE — Telephone Encounter (Signed)
Called to schedule a Medical Wellness Visit with Health Coach.  Left a message for call back.

## 2015-06-27 NOTE — Telephone Encounter (Signed)
Appointment scheduled 08/08/15 @ 10:15 am.

## 2015-08-08 ENCOUNTER — Encounter: Payer: Self-pay | Admitting: Family

## 2015-08-08 ENCOUNTER — Ambulatory Visit (INDEPENDENT_AMBULATORY_CARE_PROVIDER_SITE_OTHER): Payer: BLUE CROSS/BLUE SHIELD | Admitting: Family

## 2015-08-08 VITALS — BP 128/78 | HR 77 | Temp 98.0°F | Resp 16 | Ht 66.0 in | Wt 219.4 lb

## 2015-08-08 DIAGNOSIS — Z23 Encounter for immunization: Secondary | ICD-10-CM | POA: Diagnosis not present

## 2015-08-08 DIAGNOSIS — I10 Essential (primary) hypertension: Secondary | ICD-10-CM | POA: Diagnosis not present

## 2015-08-08 DIAGNOSIS — E785 Hyperlipidemia, unspecified: Secondary | ICD-10-CM

## 2015-08-08 DIAGNOSIS — R739 Hyperglycemia, unspecified: Secondary | ICD-10-CM | POA: Diagnosis not present

## 2015-08-08 DIAGNOSIS — Z0000 Encounter for general adult medical examination without abnormal findings: Secondary | ICD-10-CM

## 2015-08-08 DIAGNOSIS — N6452 Nipple discharge: Secondary | ICD-10-CM

## 2015-08-08 DIAGNOSIS — M25552 Pain in left hip: Secondary | ICD-10-CM

## 2015-08-08 LAB — BASIC METABOLIC PANEL
BUN: 11 mg/dL (ref 6–23)
CO2: 28 mEq/L (ref 19–32)
Calcium: 9.4 mg/dL (ref 8.4–10.5)
Chloride: 105 mEq/L (ref 96–112)
Creatinine, Ser: 0.63 mg/dL (ref 0.40–1.20)
GFR: 123.55 mL/min (ref >60.00)
Glucose, Bld: 94 mg/dL (ref 70–99)
Potassium: 3.2 mEq/L — ABNORMAL LOW (ref 3.5–5.1)
Sodium: 143 mEq/L (ref 135–145)

## 2015-08-08 LAB — LIPID PANEL
Cholesterol: 255 mg/dL — ABNORMAL HIGH (ref 0–200)
HDL: 52.5 mg/dL (ref >39.00)
LDL Cholesterol: 179 mg/dL — ABNORMAL HIGH (ref 0–99)
NonHDL: 202.27
Total CHOL/HDL Ratio: 5
Triglycerides: 118 mg/dL (ref 0.0–149.0)
VLDL: 23.6 mg/dL (ref 0.0–40.0)

## 2015-08-08 LAB — HEMOGLOBIN A1C: Hgb A1c MFr Bld: 6 % (ref 4.6–6.5)

## 2015-08-08 NOTE — Patient Instructions (Addendum)
Please complete lab work prior to leaving. Take crestor every night.  Follow up in 3 months.   Follow with Debbrah Alar as needed and/or scheduled.   Pt encouraged to eat heart healthy diet and to begin exercise regimen (Silver Sneakers and gym).  Remembering to read food labels and monitor calorie intake.

## 2015-08-08 NOTE — Progress Notes (Signed)
Subjective:    Patient ID: Jade [name], female    DOB: 07/03/1956, 59 y.o.   MRN: [id number]  HPI  Jade [name] is a 59 yr old old female who presents today for follow up.  HTN- current meds:  Amlodipine 10mg  BP Readings from Last 3 Encounters:  08/08/15 147/84  04/11/15 130/70  01/22/15 130/73   Hyperlipidemia- on crestor 20mg . Reports that she sometimes forgets crestor- was taking regularly, then got forgetful.  Lab Results  Component Value Date   CHOL 236* 04/11/2015   HDL 52.40 04/11/2015   LDLCALC 154* 04/11/2015   LDLDIRECT 154.0 12/20/2014   TRIG 150.0* 04/11/2015   CHOLHDL 5 04/11/2015   Hyperglycemia-  Lab Results  Component Value Date   HGBA1C 6.1 04/11/2015   Left Hip Pain-  X 1 year.  Requesting referral back to Dr. Maxie Better.   Notes milky d/c left breast- has been present since prior to her diagnostic mammogram.    Reports left hand weakness, left arm pain, was seeing [facility], wants to return. Reports gabapentin helps her pain.  Review of Systems    see HPI  Past Medical History  Diagnosis Date  . GERD (gastroesophageal reflux disease)   . Benign microscopic hematuria     neg cystoscopy 11/12- dr. Janice Norrie  . Hypertension   . Hyperlipidemia   . Shortness of breath     with  exertion  . Numbness and tingling in left arm   . DJD (degenerative joint disease)     L KNEE  . Family history of anesthesia complication     multiple family members with history of this  . Malignant hyperthermia     Social History   Social History  . Marital Status: Married    Spouse Name: N/A  . Number of Children: 3  . Years of Education: N/A   Occupational History  . Not on file.   Social History Main Topics  . Smoking status: Current Every Day Smoker -- 0.50 packs/day for 39 years    Types: Cigarettes  . Smokeless tobacco: Never Used     Comment: 1 1/2 cigarettes daily  . Alcohol Use: No  . Drug Use: No  . Sexual Activity: Not on file    Other Topics Concern  . Not on file   Social History Narrative   Regular exercise:  No   Caffeine Use:  2 cups coffee and 3-4 glasses of soda daily.   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    Past Surgical History  Procedure Laterality Date  . Knee cartilage surgery  1999    right knee  . Abdominal hysterectomy  1996  . Cervical disc surgery  2013    Dr. Arnoldo Morale  . Rotator cuff repair Right 05/25/13  . Tubal ligation    . Total knee arthroplasty Left 01/20/2014    Procedure: LEFT TOTAL KNEE ARTHROPLASTY;  Surgeon: Johnn Hai, MD;  Location: [facility];  Service: Orthopedics;  Laterality: Left;  . Dilation and curettage of uterus    . Total knee arthroplasty Right 01/19/2015    Procedure: RIGHT TOTAL KNEE ARTHROPLASTY;  Surgeon: Johnn Hai, MD;  Location: [facility];  Service: Orthopedics;  Laterality: Right;    Family History  Problem Relation Age of Onset  . Hypertension Mother   . Hyperlipidemia Mother   . Heart disease Father     CAD; stent  at age 1  . Diabetes Father   . Hypertension Father   . Hyperlipidemia Father   . Cancer Father     prostate  . Cancer Paternal Aunt     BREAST    Allergies  Allergen Reactions  . Shrimp [Shellfish Allergy] Itching    Rash and lips itching  . Ace Inhibitors     Cough  . Aspirin Nausea And Vomiting  . Azithromycin Rash    Current Outpatient Prescriptions on File Prior to Visit  Medication Sig Dispense Refill  . amLODipine (NORVASC) 10 MG tablet Take 1 tablet (10 mg total) by mouth every morning. 30 tablet 5  . gabapentin (NEURONTIN) 300 MG capsule TAKE 1 CAPSULE BY MOUTH IN THE MORNING, 1 AT NOON, AND 2 AT BEDTIME 120 capsule 2  . Linaclotide (LINZESS) 145 MCG CAPS capsule Take 1 capsule (145 mcg total) by mouth daily. 30 capsule 5  . methocarbamol (ROBAXIN) 500 MG tablet Take 1 tablet (500 mg total) by mouth 3 (three) times daily between meals as needed for  muscle spasms. (Patient taking differently: Take 500 mg by mouth 2 (two) times daily. ) 40 tablet 1  . omeprazole (PRILOSEC) 40 MG capsule TAKE 1 CAPSULE BY MOUTH DAILY 30 capsule 5  . potassium chloride SA (K-DUR,KLOR-CON) 20 MEQ tablet Take 1 tablet (20 mEq total) by mouth daily. 30 tablet 5  . rosuvastatin (CRESTOR) 20 MG tablet Take 1 tablet (20 mg total) by mouth daily. 30 tablet 5   No current facility-administered medications on file prior to visit.    BP 147/84 mmHg  Pulse 77  Temp(Src) 98 F (36.7 C) (Oral)  Resp 16  Ht 5\' 6"  (1.676 m)  Wt 219 lb 6.4 oz (99.519 kg)  BMI 35.43 kg/m2  SpO2 98%  LMP 11/25/1994    Objective:   Physical Exam  Constitutional: She appears well-developed and well-nourished.  Cardiovascular: Normal rate, regular rhythm and normal heart sounds.   No murmur heard. Pulmonary/Chest: Effort normal and breath sounds normal. No respiratory distress. She has no wheezes.  Musculoskeletal: She exhibits no edema.  Psychiatric: She has a normal mood and affect. Her behavior is normal. Judgment and thought content normal.          Assessment & Plan:  Breast Discharge- negative breast imaging, obtained prolactin level (normal) no further work up planned at this time.

## 2015-08-08 NOTE — Assessment & Plan Note (Signed)
BP up slightly today.  Continue current meds. Obtain bmet.

## 2015-08-08 NOTE — Progress Notes (Addendum)
Subjective:   Jade [name] is a 59 y.o. female who presents for Medicare Annual (Subsequent) preventive examination.  Review of Systems: No ROS  Sleep patterns:  Sleeps at least 8 hours each night/takes naps during the day/wakes up twice to use the bathroom at night. Home Safety/Smoke Alarms:  Feels safe at home. Lives at home with husband. Smoke alarms present.   Firearm Safety:  Keeps firearm in safe place.   Seat Belt Safety/Bike Helmet:  Always wears seat belt.    Counseling:   Eye Exam- May 2016 Dental- Has dentures, does not go routinely.   Female:  Pap-last year per patient      Mammo- 05/08/15-see report     Dexa scan-11/25/10     CCS-11/25/08, repeat in 10 years.     Objective:    Vitals: BP 128/78 mmHg  Pulse 77  Temp(Src) 98 F (36.7 C) (Oral)  Resp 16  Ht 5\' 6"  (1.676 m)  Wt 219 lb 6.4 oz (99.519 kg)  BMI 35.43 kg/m2  SpO2 98%  LMP 11/25/1994  Tobacco History  Smoking status  . Current Every Day Smoker -- 0.50 packs/day for 39 years  . Types: Cigarettes  Smokeless tobacco  . Never Used    Comment: 1 1/2 cigarettes daily     Ready to quit: No Counseling given: Yes   Past Medical History  Diagnosis Date  . GERD (gastroesophageal reflux disease)   . Benign microscopic hematuria     neg cystoscopy 11/12- dr. Janice Norrie  . Hypertension   . Hyperlipidemia   . Shortness of breath     with  exertion  . Numbness and tingling in left arm   . DJD (degenerative joint disease)     L KNEE  . Family history of anesthesia complication     multiple family members with history of this  . Malignant hyperthermia    Past Surgical History  Procedure Laterality Date  . Knee cartilage surgery  1999    right knee  . Abdominal hysterectomy  1996  . Cervical disc surgery  2013    Dr. Arnoldo Morale  . Rotator cuff repair Right 05/25/13  . Tubal ligation    . Total knee arthroplasty Left 01/20/2014    Procedure: LEFT TOTAL KNEE ARTHROPLASTY;  Surgeon: Johnn Hai, MD;   Location: [facility];  Service: Orthopedics;  Laterality: Left;  . Dilation and curettage of uterus    . Total knee arthroplasty Right 01/19/2015    Procedure: RIGHT TOTAL KNEE ARTHROPLASTY;  Surgeon: Johnn Hai, MD;  Location: [facility];  Service: Orthopedics;  Laterality: Right;   Family History  Problem Relation Age of Onset  . Hypertension Mother   . Hyperlipidemia Mother   . Heart disease Father     CAD; stent at age 71  . Diabetes Father   . Hypertension Father   . Hyperlipidemia Father   . Cancer Father     prostate  . Cancer Paternal Aunt     BREAST  . Dementia Father    History  Sexual Activity  . Sexual Activity:  . Partners: Male    Outpatient Encounter Prescriptions as of 08/08/2015  Medication Sig  . amLODipine (NORVASC) 10 MG tablet Take 1 tablet (10 mg total) by mouth every morning.  . gabapentin (NEURONTIN) 300 MG capsule TAKE 1 CAPSULE BY MOUTH IN THE MORNING, 1 AT NOON, AND 2 AT BEDTIME  . Linaclotide (LINZESS) 145 MCG CAPS capsule Take 1 capsule (145 mcg total)  by mouth daily.  . methocarbamol (ROBAXIN) 500 MG tablet Take 1 tablet (500 mg total) by mouth 3 (three) times daily between meals as needed for muscle spasms. (Patient taking differently: Take 500 mg by mouth 2 (two) times daily. )  . omeprazole (PRILOSEC) 40 MG capsule TAKE 1 CAPSULE BY MOUTH DAILY  . potassium chloride SA (K-DUR,KLOR-CON) 20 MEQ tablet Take 1 tablet (20 mEq total) by mouth daily.  . rosuvastatin (CRESTOR) 20 MG tablet Take 1 tablet (20 mg total) by mouth daily.  . [DISCONTINUED] meloxicam (MOBIC) 7.5 MG tablet Take 7.5 mg by mouth 2 (two) times daily.  . [DISCONTINUED] oxyCODONE-acetaminophen (PERCOCET) 5-325 MG per tablet Take 1 tablet by mouth every 4 (four) hours as needed. (Patient not taking: Reported on 08/08/2015)   No facility-administered encounter medications on file as of 08/08/2015.    Activities of Daily Living In your present state of health, do you have any difficulty  performing the following activities: 08/08/2015 01/20/2015  Hearing? N N  Vision? N N  Difficulty concentrating or making decisions? N N  Walking or climbing stairs? Y Y  Dressing or bathing? N N  Doing errands, shopping? N N  Preparing Food and eating ? N -  Using the Toilet? N -  In the past six months, have you accidently leaked urine? N -  Do you have problems with loss of bowel control? N -  Managing your Medications? N -  Managing your Finances? N -  Housekeeping or managing your Housekeeping? N -    Patient Care Team: Debbrah Alar, NP as PCP - General (Internal Medicine) Calvert Cantor, MD as Consulting Physician (Ophthalmology) Lelon Perla, MD as Consulting Physician (Cardiology)    Assessment:  Assessment completed by Debbrah Alar, NP.    Exercise Activities and Dietary recommendations  Exercise:  No exercise routine.  Eligible for Silver Sneakers.  Considered joining a gym.    Diet:  Describes diet as "junky."  Plans to have sausage and gravy biscuit for breakfast this morning.  Eats take out a lot.  Drinks sodas and some water.    Discussed the importance of healthy diet and exercise.  Handouts given.    Goals    . Increase physical activity     [employer] and join gym.    Marland Kitchen Lose 20 lbs by next year.        Fall Risk Fall Risk  08/08/2015  Falls in the past year? No   Depression Screen PHQ 2/9 Scores 08/08/2015  PHQ - 2 Score 0     Cognitive Testing MMSE - Mini Mental State Exam 08/08/2015  Not completed: (No Data)  AD8 screening completed.  Score 0/8.     Immunization History  Administered Date(s) Administered  . Influenza Split 10/11/2011  . Influenza,inj,Quad PF,36+ Mos 08/17/2013, 08/29/2014, 08/08/2015  . Pneumococcal Polysaccharide-23 04/20/2013  . Td 11/25/2006  . Tdap 07/15/2011   Screening Tests Health Maintenance  Topic Date Due  . Hepatitis C Screening  1956-03-15  . HIV Screening  08/06/1969  . ZOSTAVAX   08/06/2014  . INFLUENZA VACCINE  06/26/2015  . PAP SMEAR  01/11/2025 (Originally 02/23/2014)  . MAMMOGRAM  05/07/2017  . COLONOSCOPY  11/25/2018  . TETANUS/TDAP  07/14/2021      Plan:  Follow with Debbrah Alar as needed and/or scheduled.   Pt encouraged to eat heart healthy diet and to begin exercise regimen (Silver Sneakers and gym).  Remembering to read food labels and monitor calorie intake.  During the course of the visit the patient was educated and counseled about the following appropriate screening and preventive services:   Vaccines to include Pneumoccal, Influenza, Hepatitis B, Td, Zostavax, HCV  Electrocardiogram  Cardiovascular Disease  Colorectal cancer screening  Bone density screening  Diabetes screening  Glaucoma screening  Mammography/PAP  Nutrition counseling   Patient Instructions (the written plan) was given to the patient.   Rudene Anda, RN  08/08/2015     Annual wellness visit is reviewed.  Debbrah Alar NP

## 2015-08-08 NOTE — Assessment & Plan Note (Signed)
>>  ASSESSMENT AND PLAN FOR DYSLIPIDEMIA WRITTEN ON 08/08/2015 10:04 AM BY O'SULLIVAN, Kahlen Boyde, NP  Obtain follow up lipid panel.  Contin statin.  Reinforced importance of statin compliance.

## 2015-08-08 NOTE — Progress Notes (Signed)
Pre visit review using our clinic review tool, if applicable. No additional management support is needed unless otherwise documented below in the visit note. 

## 2015-08-08 NOTE — Assessment & Plan Note (Signed)
Obtain follow up lipid panel.  Contin statin.  Reinforced importance of statin compliance.

## 2015-08-09 LAB — PROLACTIN: Prolactin: 10.5 ng/mL

## 2015-08-10 ENCOUNTER — Telehealth: Payer: Self-pay | Admitting: Family

## 2015-08-10 DIAGNOSIS — E876 Hypokalemia: Secondary | ICD-10-CM

## 2015-08-10 NOTE — Telephone Encounter (Signed)
Sugar is stable. Cholesterol very high.  Advise pt on importance of taking cholesterol every day. Also, K+ is low. Is she taking k dur every day?  If not please resume and repeat bmet in 2 weeks dx hypokalemia.

## 2015-08-11 NOTE — Addendum Note (Signed)
Addended by: Debbrah Alar on: 08/11/2015 08:14 AM   Modules accepted: Level of Service

## 2015-08-11 NOTE — Telephone Encounter (Signed)
Notified pt. She has not been taking cholesterol and potassium meds daily. She will resume. Lab appt scheduled for 08/25/15 at 9:30am. Future order entered. Pt requests hormone result?  Please advise.

## 2015-08-13 NOTE — Telephone Encounter (Signed)
Prolactin level is normal.

## 2015-08-14 NOTE — Telephone Encounter (Signed)
Notitifed pt.

## 2015-08-24 ENCOUNTER — Other Ambulatory Visit: Payer: Self-pay | Admitting: Family

## 2015-08-25 ENCOUNTER — Other Ambulatory Visit (INDEPENDENT_AMBULATORY_CARE_PROVIDER_SITE_OTHER): Payer: BLUE CROSS/BLUE SHIELD

## 2015-08-25 DIAGNOSIS — E876 Hypokalemia: Secondary | ICD-10-CM

## 2015-08-25 LAB — BASIC METABOLIC PANEL
BUN: 13 mg/dL (ref 6–23)
CO2: 28 mEq/L (ref 19–32)
Calcium: 9.3 mg/dL (ref 8.4–10.5)
Chloride: 106 mEq/L (ref 96–112)
Creatinine, Ser: 0.68 mg/dL (ref 0.40–1.20)
GFR: 113.11 mL/min (ref >60.00)
Glucose, Bld: 100 mg/dL — ABNORMAL HIGH (ref 70–99)
Potassium: 4.2 mEq/L (ref 3.5–5.1)
Sodium: 141 mEq/L (ref 135–145)

## 2015-08-27 ENCOUNTER — Encounter: Payer: Self-pay | Admitting: Family

## 2015-09-22 ENCOUNTER — Other Ambulatory Visit: Payer: Self-pay | Admitting: Family

## 2015-11-03 ENCOUNTER — Other Ambulatory Visit: Payer: Self-pay | Admitting: Family

## 2015-11-10 ENCOUNTER — Encounter: Payer: Self-pay | Admitting: Family

## 2015-11-10 ENCOUNTER — Ambulatory Visit: Payer: BLUE CROSS/BLUE SHIELD | Admitting: Family

## 2015-11-10 ENCOUNTER — Ambulatory Visit (INDEPENDENT_AMBULATORY_CARE_PROVIDER_SITE_OTHER): Payer: BLUE CROSS/BLUE SHIELD | Admitting: Family

## 2015-11-10 VITALS — BP 117/44 | HR 80 | Temp 98.4°F | Resp 16 | Ht 66.0 in | Wt 212.6 lb

## 2015-11-10 DIAGNOSIS — E785 Hyperlipidemia, unspecified: Secondary | ICD-10-CM

## 2015-11-10 DIAGNOSIS — R42 Dizziness and giddiness: Secondary | ICD-10-CM

## 2015-11-10 DIAGNOSIS — I499 Cardiac arrhythmia, unspecified: Secondary | ICD-10-CM | POA: Diagnosis not present

## 2015-11-10 DIAGNOSIS — J309 Allergic rhinitis, unspecified: Secondary | ICD-10-CM | POA: Diagnosis not present

## 2015-11-10 DIAGNOSIS — I10 Essential (primary) hypertension: Secondary | ICD-10-CM | POA: Diagnosis not present

## 2015-11-10 DIAGNOSIS — K219 Gastro-esophageal reflux disease without esophagitis: Secondary | ICD-10-CM

## 2015-11-10 LAB — CBC WITH DIFFERENTIAL/PLATELET
Basophils Absolute: 0.1 10*3/uL (ref 0.0–0.1)
Basophils Relative: 1 % (ref 0–1)
Eosinophils Absolute: 0.2 10*3/uL (ref 0.0–0.7)
Eosinophils Relative: 2 % (ref 0–5)
HCT: 42.2 % (ref 36.0–46.0)
Hemoglobin: 14.2 g/dL (ref 12.0–15.0)
Lymphocytes Relative: 54 % — ABNORMAL HIGH (ref 12–46)
Lymphs Abs: 4.3 10*3/uL — ABNORMAL HIGH (ref 0.7–4.0)
MCH: 27 pg (ref 26.0–34.0)
MCHC: 33.6 g/dL (ref 30.0–36.0)
MCV: 80.4 fL (ref 78.0–100.0)
MPV: 10.4 fL (ref 8.6–12.4)
Monocytes Absolute: 0.8 10*3/uL (ref 0.1–1.0)
Monocytes Relative: 10 % (ref 3–12)
Neutro Abs: 2.6 10*3/uL (ref 1.7–7.7)
Neutrophils Relative %: 33 % — ABNORMAL LOW (ref 43–77)
Platelets: 357 10*3/uL (ref 150–400)
RBC: 5.25 MIL/uL — ABNORMAL HIGH (ref 3.87–5.11)
RDW: 13.9 % (ref 11.5–15.5)
WBC: 8 10*3/uL (ref 4.0–10.5)

## 2015-11-10 LAB — COMPREHENSIVE METABOLIC PANEL
ALT: 15 U/L (ref 6–29)
AST: 14 U/L (ref 10–35)
Albumin: 4.2 g/dL (ref 3.6–5.1)
Alkaline Phosphatase: 102 U/L (ref 33–130)
BUN: 11 mg/dL (ref 7–25)
CO2: 25 mmol/L (ref 20–31)
Calcium: 9.3 mg/dL (ref 8.6–10.4)
Chloride: 105 mmol/L (ref 98–110)
Creat: 0.69 mg/dL (ref 0.50–0.99)
Glucose, Bld: 101 mg/dL — ABNORMAL HIGH (ref 65–99)
Potassium: 3.8 mmol/L (ref 3.5–5.3)
Sodium: 139 mmol/L (ref 135–146)
Total Bilirubin: 0.4 mg/dL (ref 0.2–1.2)
Total Protein: 6.8 g/dL (ref 6.1–8.1)

## 2015-11-10 LAB — LIPID PANEL
Cholesterol: 242 mg/dL — ABNORMAL HIGH (ref 125–200)
HDL: 42 mg/dL — ABNORMAL LOW (ref >=46)
LDL Cholesterol: 150 mg/dL — ABNORMAL HIGH (ref <130)
Total CHOL/HDL Ratio: 5.8 Ratio — ABNORMAL HIGH (ref <=5.0)
Triglycerides: 249 mg/dL — ABNORMAL HIGH (ref <150)
VLDL: 50 mg/dL — ABNORMAL HIGH (ref <30)

## 2015-11-10 MED ORDER — FLUTICASONE PROPIONATE 50 MCG/ACT NA SUSP: 2.0000 | Freq: Every day | NASAL | Status: DC

## 2015-11-10 NOTE — Assessment & Plan Note (Signed)
Stable on PPI 

## 2015-11-10 NOTE — Progress Notes (Signed)
Pre visit review using our clinic review tool, if applicable. No additional management support is needed unless otherwise documented below in the visit note. 

## 2015-11-10 NOTE — Assessment & Plan Note (Signed)
Tolerating statin, obtain follow-up lipid panel. 

## 2015-11-10 NOTE — Progress Notes (Signed)
Subjective:    Patient ID: Jade [name], female    DOB: 1956/08/14, 59 y.o.   MRN: [id number]  HPI  Jade [name] is a 59 yr old female who presents today for follow up.  1) HTN-  On amlodipine.   BP Readings from Last 3 Encounters:  11/10/15 117/44  08/08/15 128/78  04/11/15 130/70   2) Hyperlipidemia-  She reports fair diet.  Trying to better about taking her crestor.  Denies myalgia.  Lab Results  Component Value Date   CHOL 255* 08/08/2015   HDL 52.50 08/08/2015   LDLCALC 179* 08/08/2015   LDLDIRECT 154.0 12/20/2014   TRIG 118.0 08/08/2015   CHOLHDL 5 08/08/2015   3) Nasal congestion- x 1 month Not improved with zyrtec.  Nasal drainage clear.  Denies ever.    4) GERD- stable on prilosec.     Review of Systems  HENT: Positive for rhinorrhea.   Respiratory: Negative for shortness of breath.   Cardiovascular: Negative for chest pain.   See HPI  Past Medical History  Diagnosis Date  . GERD (gastroesophageal reflux disease)   . Benign microscopic hematuria     neg cystoscopy 11/12- dr. Janice Norrie  . Hypertension   . Hyperlipidemia   . Shortness of breath     with  exertion  . Numbness and tingling in left arm   . DJD (degenerative joint disease)     L KNEE  . Family history of anesthesia complication     multiple family members with history of this  . Malignant hyperthermia     Social History   Social History  . Marital Status: Married    Spouse Name: N/A  . Number of Children: 3  . Years of Education: N/A   Occupational History  . Not on file.   Social History Main Topics  . Smoking status: Current Every Day Smoker -- 0.50 packs/day for 39 years    Types: Cigarettes  . Smokeless tobacco: Never Used     Comment: 1 1/2 cigarettes daily  . Alcohol Use: No  . Drug Use: No  . Sexual Activity:    Partners: Male   Other Topics Concern  . Not on file   Social History Narrative   Regular exercise:  No   Caffeine Use:  2 cups coffee and 3-4 glasses  of soda daily.   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    Past Surgical History  Procedure Laterality Date  . Knee cartilage surgery  1999    right knee  . Abdominal hysterectomy  1996  . Cervical disc surgery  2013    Dr. Arnoldo Morale  . Rotator cuff repair Right 05/25/13  . Tubal ligation    . Total knee arthroplasty Left 01/20/2014    Procedure: LEFT TOTAL KNEE ARTHROPLASTY;  Surgeon: Johnn Hai, MD;  Location: [facility];  Service: Orthopedics;  Laterality: Left;  . Dilation and curettage of uterus    . Total knee arthroplasty Right 01/19/2015    Procedure: RIGHT TOTAL KNEE ARTHROPLASTY;  Surgeon: Johnn Hai, MD;  Location: [facility];  Service: Orthopedics;  Laterality: Right;    Family History  Problem Relation Age of Onset  . Hypertension Mother   . Hyperlipidemia Mother   . Heart disease Father     CAD; stent at age 59  . Diabetes Father   . Hypertension Father   . Hyperlipidemia Father   .  Cancer Father     prostate  . Cancer Paternal Aunt     BREAST  . Dementia Father     Allergies  Allergen Reactions  . Shrimp [Shellfish Allergy] Itching    Rash and lips itching  . Ace Inhibitors     Cough  . Aspirin Nausea And Vomiting  . Azithromycin Rash    Current Outpatient Prescriptions on File Prior to Visit  Medication Sig Dispense Refill  . amLODipine (NORVASC) 10 MG tablet Take 1 tablet (10 mg total) by mouth every morning. 30 tablet 5  . gabapentin (NEURONTIN) 300 MG capsule TAKE 1 CAPSULE BY MOUTH IN THE MORNING, 1 AT NOON, AND 2 AT BEDTIME 120 capsule 2  . LINZESS 145 MCG CAPS capsule TAKE 1 CAPSULE (145 MCG TOTAL) BY MOUTH DAILY. 30 capsule 5  . omeprazole (PRILOSEC) 40 MG capsule TAKE 1 CAPSULE BY MOUTH DAILY 30 capsule 5  . potassium chloride SA (K-DUR,KLOR-CON) 20 MEQ tablet Take 1 tablet (20 mEq total) by mouth daily. 30 tablet 5  . rosuvastatin (CRESTOR) 20 MG tablet Take 1 tablet (20 mg total)  by mouth daily. 30 tablet 5   No current facility-administered medications on file prior to visit.    BP 117/44 mmHg  Pulse 80  Temp(Src) 98.4 F (36.9 C) (Oral)  Resp 16  Ht 5\' 6"  (1.676 m)  Wt 212 lb 9.6 oz (96.435 kg)  BMI 34.33 kg/m2  SpO2 99%  LMP 11/25/1994       Objective:   Physical Exam  Constitutional: She is oriented to person, place, and time. She appears well-developed and well-nourished.  HENT:  Head: Normocephalic and atraumatic.  Right Ear: Tympanic membrane and ear canal normal.  Left Ear: Tympanic membrane and ear canal normal.  Cardiovascular: Normal rate, regular rhythm and normal heart sounds.   No murmur heard. Pulmonary/Chest: Effort normal and breath sounds normal. No respiratory distress. She has no wheezes.  Musculoskeletal: She exhibits no edema.  Lymphadenopathy:    She has no cervical adenopathy.  Neurological: She is alert and oriented to person, place, and time.  Skin: Skin is warm and dry.  Psychiatric: She has a normal mood and affect. Her behavior is normal. Judgment and thought content normal.          Assessment & Plan:  EKG performed today in office and personally reviewed.  Appears essentially unchanged as compared to previous EKG 10/07/13.

## 2015-11-10 NOTE — Patient Instructions (Signed)
Please complete lab work prior to leaving. Call us if you develop recurrent dizziness.   Start flonase 2 sprays each nostril once daily,

## 2015-11-10 NOTE — Assessment & Plan Note (Signed)
>>  ASSESSMENT AND PLAN FOR DYSLIPIDEMIA WRITTEN ON 11/10/2015  2:25 PM BY O'SULLIVAN, Jovann Luse, NP  Tolerating statin, obtain follow up lipid panel.

## 2015-11-10 NOTE — Assessment & Plan Note (Signed)
Stable on amlodipine. Continue same.  

## 2015-11-10 NOTE — Assessment & Plan Note (Signed)
Uncontrolled,  Add trial of flonase.

## 2015-11-11 LAB — TSH: TSH: 1.14 u[IU]/mL (ref 0.350–4.500)

## 2015-11-12 ENCOUNTER — Telehealth: Payer: Self-pay | Admitting: Family

## 2015-11-12 DIAGNOSIS — E785 Hyperlipidemia, unspecified: Secondary | ICD-10-CM

## 2015-11-12 MED ORDER — ROSUVASTATIN CALCIUM 40 MG PO TABS: 40.0000 mg | ORAL_TABLET | Freq: Every day | ORAL | Status: DC

## 2015-11-12 NOTE — Telephone Encounter (Signed)
Cholesterol is still elevated, rec crestor 40mg  once daily.  Repeat flp in 6 weeks. Dx hyperlipidemia

## 2015-11-13 NOTE — Telephone Encounter (Signed)
Patient returning your call best #  Mobile

## 2015-11-13 NOTE — Telephone Encounter (Signed)
Left message for pt to return my call.

## 2015-11-13 NOTE — Telephone Encounter (Signed)
Left message on cell# for pt to return my call. 

## 2015-11-15 NOTE — Telephone Encounter (Signed)
Notified pt. Lab appt scheduled for 12/27/15 at 10:30am.  Future order entered.

## 2015-12-04 MED FILL — OMEPRAZOLE DR 40 MG CAPSULE: 40 | 30 days supply | Qty: 30 | Fill #2

## 2015-12-19 ENCOUNTER — Telehealth: Payer: Self-pay | Admitting: Family

## 2015-12-19 MED ORDER — FLUCONAZOLE 150 MG PO TABS: ORAL_TABLET | ORAL | Status: DC

## 2015-12-19 MED FILL — FLUCONAZOLE 150 MG TABLET: 150 | 3 days supply | Qty: 2 | Fill #0

## 2015-12-19 NOTE — Telephone Encounter (Signed)
rx sent for diflucan, needs OV if symptoms fail to improve with diflucan.

## 2015-12-19 NOTE — Telephone Encounter (Signed)
Caller name: Elonna   Relationship to patient: Self   Can be reached: [phone]  Pharmacy: [facility] [place]  Reason for call: Pt says that she has been on a lot of antibiotics and want something called in for a yeast infection. Please assist further.

## 2015-12-19 NOTE — Telephone Encounter (Signed)
Notified pt and she voices understanding. 

## 2015-12-27 ENCOUNTER — Other Ambulatory Visit (INDEPENDENT_AMBULATORY_CARE_PROVIDER_SITE_OTHER): Payer: BLUE CROSS/BLUE SHIELD

## 2015-12-27 DIAGNOSIS — E785 Hyperlipidemia, unspecified: Secondary | ICD-10-CM

## 2015-12-27 LAB — LIPID PANEL
Cholesterol: 232 mg/dL — ABNORMAL HIGH (ref 0–200)
HDL: 41.4 mg/dL (ref >39.00)
LDL Cholesterol: 163 mg/dL — ABNORMAL HIGH (ref 0–99)
NonHDL: 190.64
Total CHOL/HDL Ratio: 6
Triglycerides: 140 mg/dL (ref 0.0–149.0)
VLDL: 28 mg/dL (ref 0.0–40.0)

## 2015-12-28 ENCOUNTER — Telehealth: Payer: Self-pay | Admitting: Family

## 2015-12-28 DIAGNOSIS — E7800 Pure hypercholesterolemia, unspecified: Secondary | ICD-10-CM

## 2015-12-28 NOTE — Telephone Encounter (Signed)
LDL still above goal but trigs look better. Is she taking crestor every night?  If so, I will add zetia and plan to repeat Lipids again in 6 weeks.

## 2015-12-29 NOTE — Telephone Encounter (Signed)
Left message for pt to return my call.

## 2016-01-03 MED FILL — OMEPRAZOLE DR 40 MG CAPSULE: 40 | 30 days supply | Qty: 30 | Fill #3

## 2016-01-04 NOTE — Telephone Encounter (Addendum)
Pt notified and made aware.  She stated understanding and agrees with plan.  Pt scheduled to follow up with Melissa on 02/13/16.  Labs to be repeated during this visit.  Future lab ordered.    Pt states she is taking Crestor every night.  Please clarify dosage for Zetia.

## 2016-01-05 MED ORDER — EZETIMIBE 10 MG PO TABS: 10.0000 mg | ORAL_TABLET | Freq: Every day | ORAL | Status: DC

## 2016-01-05 NOTE — Telephone Encounter (Signed)
rx sent for zetia.

## 2016-01-24 HISTORY — PX: BREAST EXCISIONAL BIOPSY: SUR124

## 2016-01-25 ENCOUNTER — Other Ambulatory Visit: Payer: Self-pay | Admitting: Family Medicine

## 2016-01-25 MED FILL — AMLODIPINE BESYLATE 10 MG T: 10 | 30 days supply | Qty: 30 | Fill #2

## 2016-01-25 MED FILL — GABAPENTIN 300 MG CAPSULE: 300 | 30 days supply | Qty: 120 | Fill #0

## 2016-01-26 ENCOUNTER — Other Ambulatory Visit: Payer: Self-pay | Admitting: Family

## 2016-01-26 MED FILL — LINZESS 145 MCG CAPSULE: 145 | 30 days supply | Qty: 30 | Fill #1

## 2016-01-26 MED FILL — OMEPRAZOLE DR 40 MG CAPSULE: 40 | 30 days supply | Qty: 30 | Fill #4

## 2016-01-26 MED FILL — FLUTICASONE PROP 50 MCG SPR: 50 | 30 days supply | Qty: 16 | Fill #1

## 2016-01-26 MED FILL — POTASSIUM CL ER 20 MEQ TABL: 20 | 30 days supply | Qty: 30 | Fill #0

## 2016-02-13 ENCOUNTER — Ambulatory Visit (INDEPENDENT_AMBULATORY_CARE_PROVIDER_SITE_OTHER): Payer: BLUE CROSS/BLUE SHIELD | Admitting: Family

## 2016-02-13 ENCOUNTER — Encounter: Payer: Self-pay | Admitting: Family

## 2016-02-13 VITALS — BP 140/62 | HR 74 | Temp 98.0°F | Resp 16 | Ht 66.0 in | Wt 201.4 lb

## 2016-02-13 DIAGNOSIS — E785 Hyperlipidemia, unspecified: Secondary | ICD-10-CM

## 2016-02-13 DIAGNOSIS — M5441 Lumbago with sciatica, right side: Secondary | ICD-10-CM | POA: Diagnosis not present

## 2016-02-13 DIAGNOSIS — N611 Abscess of the breast and nipple: Secondary | ICD-10-CM

## 2016-02-13 DIAGNOSIS — R634 Abnormal weight loss: Secondary | ICD-10-CM | POA: Diagnosis not present

## 2016-02-13 DIAGNOSIS — R55 Syncope and collapse: Secondary | ICD-10-CM | POA: Diagnosis not present

## 2016-02-13 LAB — LIPID PANEL
Cholesterol: 245 mg/dL — ABNORMAL HIGH (ref 0–200)
HDL: 44.2 mg/dL (ref >39.00)
LDL Cholesterol: 172 mg/dL — ABNORMAL HIGH (ref 0–99)
NonHDL: 200.82
Total CHOL/HDL Ratio: 6
Triglycerides: 144 mg/dL (ref 0.0–149.0)
VLDL: 28.8 mg/dL (ref 0.0–40.0)

## 2016-02-13 LAB — TSH: TSH: 1.23 u[IU]/mL (ref 0.35–4.50)

## 2016-02-13 MED ORDER — FLUCONAZOLE 150 MG PO TABS: ORAL_TABLET | ORAL | Status: DC

## 2016-02-13 MED ORDER — PANTOPRAZOLE SODIUM 40 MG PO TBEC: 40.0000 mg | DELAYED_RELEASE_TABLET | Freq: Every day | ORAL | Status: DC

## 2016-02-13 MED ORDER — DOXYCYCLINE HYCLATE 100 MG PO TABS: 100.0000 mg | ORAL_TABLET | Freq: Two times a day (BID) | ORAL | Status: DC

## 2016-02-13 MED FILL — PANTOPRAZOLE SOD DR 40 MG T: 40 | 30 days supply | Qty: 30 | Fill #0

## 2016-02-13 MED FILL — FLUCONAZOLE 150 MG TABLET: 150 | 6 days supply | Qty: 2 | Fill #0

## 2016-02-13 MED FILL — DOXYCYCLINE 100 MG TABLET: 100 | 20 days supply | Qty: 20 | Fill #0

## 2016-02-13 NOTE — Patient Instructions (Addendum)
Complete lab work prior to leaving.  Start doxycycline (antibiotic) for breast abscess/infection. Contact Dr. Adair Laundry for appointment today or tomorrow. Let me know if you have any trouble getting in to be seen. Stop omeprazole, start protonix for reflux. Start diflucan for possible yeast infection. You will be contacted about your referral to Dr. Maxie Better for your back.   Call if any recurrent falls/dizziness.      Gastroesophageal Reflux Disease, Adult Normally, food travels down the esophagus and stays in the stomach to be digested. If a person has gastroesophageal reflux disease (GERD), food and stomach acid move back up into the esophagus. When this happens, the esophagus becomes sore and swollen (inflamed). Over time, GERD can make small holes (ulcers) in the lining of the esophagus. HOME CARE Diet  Follow a diet as told by your doctor. You may need to avoid foods and drinks such as:  Coffee and tea (with or without caffeine).  Drinks that contain alcohol.  Energy drinks and sports drinks.  Carbonated drinks or sodas.  Chocolate and cocoa.  Peppermint and mint flavorings.  Garlic and onions.  Horseradish.  Spicy and acidic foods, such as peppers, chili powder, curry powder, vinegar, hot sauces, and BBQ sauce.  Citrus fruit juices and citrus fruits, such as oranges, lemons, and limes.  Tomato-based foods, such as red sauce, chili, salsa, and pizza with red sauce.  Fried and fatty foods, such as donuts, french fries, potato chips, and high-fat dressings.  High-fat meats, such as hot dogs, rib eye steak, sausage, ham, and bacon.  High-fat dairy items, such as whole milk, butter, and cream cheese.  Eat small meals often. Avoid eating large meals.  Avoid drinking large amounts of liquid with your meals.  Avoid eating meals during the 2-3 hours before bedtime.  Avoid lying down right after you eat.  Do not exercise right after you eat. General  Instructions  Pay attention to any changes in your symptoms.  Take over-the-counter and prescription medicines only as told by your doctor. Do not take aspirin, ibuprofen, or other NSAIDs unless your doctor says it is okay.  Do not use any tobacco products, including cigarettes, chewing tobacco, and e-cigarettes. If you need help quitting, ask your doctor.  Wear loose clothes. Do not wear anything tight around your waist.  Raise (elevate) the head of your bed about 6 inches (15 cm).  Try to lower your stress. If you need help doing this, ask your doctor.  If you are overweight, lose an amount of weight that is healthy for you. Ask your doctor about a safe weight loss goal.  Keep all follow-up visits as told by your doctor. This is important. GET HELP IF:  You have new symptoms.  You lose weight and you do not know why it is happening.  You have trouble swallowing, or it hurts to swallow.  You have wheezing or a cough that keeps happening.  Your symptoms do not get better with treatment.  You have a hoarse voice. GET HELP RIGHT AWAY IF:  You have pain in your arms, neck, jaw, teeth, or back.  You feel sweaty, dizzy, or light-headed.  You have chest pain or shortness of breath.  You throw up (vomit) and your throw up looks like blood or coffee grounds.  You pass out (faint).  Your poop (stool) is bloody or black.  You cannot swallow, drink, or eat.   This information is not intended to replace advice given to you by your health  care provider. Make sure you discuss any questions you have with your health care provider.   Document Released: 04/29/2008 Document Revised: 08/02/2015 Document Reviewed: 03/08/2015 Elsevier Interactive Patient Education [employer].

## 2016-02-13 NOTE — Progress Notes (Signed)
Subjective:    Patient ID: Jade [name], female    DOB: [birth year], 60 y.o.   MRN: [id number]  HPI  Pt presents today for follow up.   Hyperlipidemia- on crestor and zetia.  Reports tolerating without difficulty.  Lab Results  Component Value Date   CHOL 232* 12/27/2015   HDL 41.40 12/27/2015   LDLCALC 163* 12/27/2015   LDLDIRECT 154.0 12/20/2014   TRIG 140.0 12/27/2015   CHOLHDL 6 12/27/2015   2) HTN- Reports compliance with amlodipine.  BP Readings from Last 3 Encounters:  02/13/16 140/62  11/10/15 117/44  08/08/15 128/78   3) Weight loss- reports that she lost weight initially during treatment for breast abscess, but since that time she has had stress and attributes weight loss to stress.  No formal exercise, walks at work though.  Wt Readings from Last 3 Encounters:  02/13/16 201 lb 6.4 oz (91.354 kg)  11/10/15 212 lb 9.6 oz (96.435 kg)  08/08/15 219 lb 6.4 oz (99.519 kg)   4) allergic rhinitis- now on flonase which seems to be helping.   5) GERD- on prilosec, does not feel that symptoms are not well controlled.    Breast Abscess- Had biopsy in June- consistent with cyst. Area began draining.  GYN referred her to Dr. Adair Laundry.   Had healed but she reports that last night it began to drain again.  (Dr. Adair Laundry- reviewed office note from 02/05/16- which noted near resolution of I and D site.  I and d was performed on 2/17. Path was negative. Reports no fever.  Area was tender until it began draining.   Back Pain- would like referral to Dr. Maxie Better for back pain. Was using advil without improvement. Pain radiates around the abdomen and also has some pain into the right leg.   She does report 2 falls in January, both occurred in the shower.  Thinks she may have some light headedness with standing.  Not sure if she passed out during these falls.    Vaginitis-  Reports vaginal itching which she attributes to recent abx use.  Review of Systems See HPI  Past Medical  History  Diagnosis Date  . GERD (gastroesophageal reflux disease)   . Benign microscopic hematuria     neg cystoscopy 11/12- dr. Janice Norrie  . Hypertension   . Hyperlipidemia   . Shortness of breath     with  exertion  . Numbness and tingling in left arm   . DJD (degenerative joint disease)     L KNEE  . Family history of anesthesia complication     multiple family members with history of this  . Malignant hyperthermia     Social History   Social History  . Marital Status: Married    Spouse Name: N/A  . Number of Children: 3  . Years of Education: N/A   Occupational History  . Not on file.   Social History Main Topics  . Smoking status: Current Every Day Smoker -- 0.50 packs/day for 39 years    Types: Cigarettes  . Smokeless tobacco: Never Used     Comment: 1 1/2 cigarettes daily  . Alcohol Use: No  . Drug Use: No  . Sexual Activity:    Partners: Male   Other Topics Concern  . Not on file   Social History Narrative   Regular exercise:  No   Caffeine Use:  2 cups coffee and 3-4 glasses of soda daily.   Married, 3 children- grown  Works as a Quarry manager at [employer].   Completed 12th grade.    Past Surgical History  Procedure Laterality Date  . Knee cartilage surgery  1999    right knee  . Abdominal hysterectomy  1996  . Cervical disc surgery  2013    Dr. Arnoldo Morale  . Rotator cuff repair Right 05/25/13  . Tubal ligation    . Total knee arthroplasty Left 01/20/2014    Procedure: LEFT TOTAL KNEE ARTHROPLASTY;  Surgeon: Johnn Hai, MD;  Location: [facility];  Service: Orthopedics;  Laterality: Left;  . Dilation and curettage of uterus    . Total knee arthroplasty Right 01/19/2015    Procedure: RIGHT TOTAL KNEE ARTHROPLASTY;  Surgeon: Johnn Hai, MD;  Location: [facility];  Service: Orthopedics;  Laterality: Right;    Family History  Problem Relation Age of Onset  . Hypertension Mother   . Hyperlipidemia Mother   . Heart disease Father     CAD; stent  at age 27  . Diabetes Father   . Hypertension Father   . Hyperlipidemia Father   . Cancer Father     prostate  . Cancer Paternal Aunt     BREAST  . Dementia Father     Allergies  Allergen Reactions  . Shrimp [Shellfish Allergy] Itching    Rash and lips itching  . Ace Inhibitors     Cough  . Aspirin Nausea And Vomiting  . Azithromycin Rash    Current Outpatient Prescriptions on File Prior to Visit  Medication Sig Dispense Refill  . amLODipine (NORVASC) 10 MG tablet Take 1 tablet (10 mg total) by mouth every morning. 30 tablet 5  . ezetimibe (ZETIA) 10 MG tablet Take 1 tablet (10 mg total) by mouth daily. 30 tablet 5  . fluticasone (FLONASE) 50 MCG/ACT nasal spray Place 2 sprays into both nostrils daily. 16 g 2  . gabapentin (NEURONTIN) 300 MG capsule TAKE 1 CAPSULE BY MOUTH IN THE MORNING, 1 AT NOON, AND 2 AT BEDTIME 120 capsule 2  . LINZESS 145 MCG CAPS capsule TAKE 1 CAPSULE (145 MCG TOTAL) BY MOUTH DAILY. 30 capsule 5  . omeprazole (PRILOSEC) 40 MG capsule TAKE 1 CAPSULE BY MOUTH DAILY 30 capsule 5  . potassium chloride SA (K-DUR,KLOR-CON) 20 MEQ tablet TAKE 1 TABLET (20 MEQ TOTAL) BY MOUTH DAILY. 30 tablet 5  . rosuvastatin (CRESTOR) 40 MG tablet Take 1 tablet (40 mg total) by mouth daily. 30 tablet 3   No current facility-administered medications on file prior to visit.    BP 140/62 mmHg  Pulse 74  Temp(Src) 98 F (36.7 C) (Oral)  Resp 16  Ht 5\' 6"  (1.676 m)  Wt 201 lb 6.4 oz (91.354 kg)  BMI 32.52 kg/m2  SpO2 99%  LMP 11/25/1994       Objective:   Physical Exam  Constitutional: She is oriented to person, place, and time. She appears well-developed and well-nourished.  HENT:  Head: Normocephalic and atraumatic.  Cardiovascular: Normal rate, regular rhythm and normal heart sounds.   No murmur heard. Pulmonary/Chest: Effort normal and breath sounds normal. No respiratory distress. She has no wheezes.  Musculoskeletal: She exhibits no edema.    Lymphadenopathy:    She has no cervical adenopathy.  Neurological: She is alert and oriented to person, place, and time.  Skin: Skin is warm and dry.  Psychiatric: She has a normal mood and affect. Her behavior is normal. Judgment and thought content normal.  Breasts: + abscess  left breast with drainage in center.  + associated induration/erythema, approximately 2 inch diameter.       Assessment & Plan:  Low back pain- refer to Dr. Maxie Better at pt request.  HTN- fair bp, continue current meds  Hyperlipidemia- tolerating meds, obtain lipid panel.   Weight loss- obtain tsh  GERD- d/c omeprazole, start protonix, given info on gerd diet.   EKG tracing is personally reviewed.  EKG notes NSR.  No acute changes.   Breast Abscess- recurrent. start doxycycline, pt to follow back up with surgery today or tomorrow for further evaluation and possible I and D.

## 2016-02-15 ENCOUNTER — Telehealth: Payer: Self-pay | Admitting: Family

## 2016-02-15 DIAGNOSIS — E785 Hyperlipidemia, unspecified: Secondary | ICD-10-CM

## 2016-02-15 NOTE — Telephone Encounter (Signed)
Please let pt know that her cholesterol remains uncontrolled.  I would like to refer her to the lipid clinic.

## 2016-02-15 NOTE — Telephone Encounter (Signed)
Attempted to contact patient to see how she is doing and make sure that she was able to get back in to see the surgeon re: breast abscess.  No answer on home or cell. Left detailed message requesting call back to update [place] on how she is doing and also notified her to expect call re: lipid clinic.

## 2016-02-16 NOTE — Telephone Encounter (Signed)
Could you please contact pt today to follow up? Thanks.

## 2016-02-16 NOTE — Telephone Encounter (Signed)
Left detailed message on pt's voicemail re: below instructions and to call us with a status update.

## 2016-02-19 NOTE — Telephone Encounter (Signed)
Left message for pt to return my call.

## 2016-02-23 ENCOUNTER — Telehealth: Payer: Self-pay | Admitting: Family

## 2016-02-23 NOTE — Telephone Encounter (Signed)
New Message  This message is to inform you that we have made 3 consecutive attempts to contact the patient. We have also mailed a letter to the patient to inform them to call in and schedule. Although we were unsuccessful in these attempts we wanted you to be aware of our efforts. Will remove the patient from our work queue at this time.    [facility]

## 2016-02-27 NOTE — Telephone Encounter (Signed)
Pt returned my call and states she just completed u/s and mammogram and will likely have to surgically remove the cyst since it will not clear up with antibiotics. She sees the surgeon again on Monday. Pt is agreeable to lipid clinic referral and phone # was given to pt to contact them for appt.

## 2016-03-12 ENCOUNTER — Encounter: Payer: Self-pay | Admitting: Family

## 2016-03-12 ENCOUNTER — Ambulatory Visit (INDEPENDENT_AMBULATORY_CARE_PROVIDER_SITE_OTHER): Payer: BLUE CROSS/BLUE SHIELD | Admitting: Family

## 2016-03-12 VITALS — BP 130/70 | HR 73 | Temp 98.5°F | Resp 18 | Ht 66.0 in | Wt 200.0 lb

## 2016-03-12 DIAGNOSIS — R002 Palpitations: Secondary | ICD-10-CM

## 2016-03-12 DIAGNOSIS — N611 Abscess of the breast and nipple: Secondary | ICD-10-CM | POA: Diagnosis not present

## 2016-03-12 DIAGNOSIS — K219 Gastro-esophageal reflux disease without esophagitis: Secondary | ICD-10-CM

## 2016-03-12 DIAGNOSIS — R110 Nausea: Secondary | ICD-10-CM | POA: Diagnosis not present

## 2016-03-12 DIAGNOSIS — R350 Frequency of micturition: Secondary | ICD-10-CM | POA: Diagnosis not present

## 2016-03-12 LAB — COMPREHENSIVE METABOLIC PANEL
ALT: 14 U/L (ref 0–35)
AST: 12 U/L (ref 0–37)
Albumin: 4.3 g/dL (ref 3.5–5.2)
Alkaline Phosphatase: 92 U/L (ref 39–117)
BUN: 9 mg/dL (ref 6–23)
CO2: 30 mEq/L (ref 19–32)
Calcium: 9.6 mg/dL (ref 8.4–10.5)
Chloride: 103 mEq/L (ref 96–112)
Creatinine, Ser: 0.75 mg/dL (ref 0.40–1.20)
GFR: 100.83 mL/min (ref >60.00)
Glucose, Bld: 93 mg/dL (ref 70–99)
Potassium: 3.3 mEq/L — ABNORMAL LOW (ref 3.5–5.1)
Sodium: 141 mEq/L (ref 135–145)
Total Bilirubin: 0.5 mg/dL (ref 0.2–1.2)
Total Protein: 7.4 g/dL (ref 6.0–8.3)

## 2016-03-12 LAB — CBC WITH DIFFERENTIAL/PLATELET
Basophils Absolute: 0.1 10*3/uL (ref 0.0–0.1)
Basophils Relative: 0.9 % (ref 0.0–3.0)
Eosinophils Absolute: 0.1 10*3/uL (ref 0.0–0.7)
Eosinophils Relative: 1.7 % (ref 0.0–5.0)
HCT: 42.4 % (ref 36.0–46.0)
Hemoglobin: 14.1 g/dL (ref 12.0–15.0)
Lymphocytes Relative: 26.4 % (ref 12.0–46.0)
Lymphs Abs: 2.1 10*3/uL (ref 0.7–4.0)
MCHC: 33.2 g/dL (ref 30.0–36.0)
MCV: 80.3 fl (ref 78.0–100.0)
Monocytes Absolute: 0.7 10*3/uL (ref 0.1–1.0)
Monocytes Relative: 8.2 % (ref 3.0–12.0)
Neutro Abs: 5.1 10*3/uL (ref 1.4–7.7)
Neutrophils Relative %: 62.8 % (ref 43.0–77.0)
Platelets: 300 10*3/uL (ref 150.0–400.0)
RBC: 5.28 Mil/uL — ABNORMAL HIGH (ref 3.87–5.11)
RDW: 14.5 % (ref 11.5–15.5)
WBC: 8.1 10*3/uL (ref 4.0–10.5)

## 2016-03-12 LAB — URINALYSIS, ROUTINE W REFLEX MICROSCOPIC
Hgb urine dipstick: NEGATIVE
Leukocytes, UA: NEGATIVE
Nitrite: NEGATIVE
RBC / HPF: NONE SEEN (ref 0–?)
Specific Gravity, Urine: 1.02 (ref 1.000–1.030)
Urine Glucose: NEGATIVE
Urobilinogen, UA: 0.2 (ref 0.0–1.0)
WBC, UA: NONE SEEN (ref 0–?)
pH: 5.5 (ref 5.0–8.0)

## 2016-03-12 MED ORDER — ONDANSETRON HCL 4 MG/2ML IJ SOLN
4.0000 mg | Freq: Once | INTRAMUSCULAR | Status: AC
  Administered 2016-03-12: 4 mg via INTRAMUSCULAR

## 2016-03-12 MED ORDER — ONDANSETRON 4 MG PO TBDP: 4.0000 mg | ORAL_TABLET | Freq: Three times a day (TID) | ORAL | Status: DC | PRN

## 2016-03-12 MED FILL — ONDANSETRON ODT 4 MG TABLET: 4 | 7 days supply | Qty: 20 | Fill #0

## 2016-03-12 MED FILL — PANTOPRAZOLE SOD DR 40 MG T: 40 | 30 days supply | Qty: 30 | Fill #1

## 2016-03-12 MED FILL — LINZESS 145 MCG CAPSULE: 145 | 30 days supply | Qty: 30 | Fill #2

## 2016-03-12 MED FILL — POTASSIUM CL ER 20 MEQ TABL: 20 | 30 days supply | Qty: 30 | Fill #1

## 2016-03-12 MED FILL — FLUTICASONE PROP 50 MCG SPR: 50 | 30 days supply | Qty: 16 | Fill #2

## 2016-03-12 MED FILL — AMLODIPINE BESYLATE 10 MG T: 10 | 30 days supply | Qty: 30 | Fill #3

## 2016-03-12 MED FILL — GABAPENTIN 300 MG CAPSULE: 300 | 30 days supply | Qty: 120 | Fill #1

## 2016-03-12 NOTE — Progress Notes (Signed)
Subjective:    Patient ID: Jade [name], female    DOB: Jul 09, 1956, 60 y.o.   MRN: [id number]  HPI   Jade [name] is a 60 yr old female who was initially scheduled for routine follow up, but who woke this AM with dizziness/nausea.  She began her shift at 6 AM. At 7AM began to feel "real light headed/dizzy."  Felt "like in a daze."  She then developed some brief palpitations "when I got real hot."  Denies LOC.  She reports mild nausea without vomiting. Denies fever, denies diarrhea. Denies dysuria, denies hematuria, + urinary frequency.  She reports co-worker had strep throat.  She denies sore throat    GERD- currently maintained on PPI. Denies GERD symptoms.    Breast Abscess-  Last visit she was noted to have recurrent breast abscess and was referred back to her surgeon. Surgery notes reviewed in Care everywhere.  He opted to continue on oral antibiotics and monitor. A bilateral diagnostic mammogram was ordered. Mammogram/US noted defect in the dermis connecting via a thin tract to an irregular oval hypoechoic mass measuring 5 x 11 x 15 mm. It does not shadow and it is markedly Hypervascular. Surgery recommended re-excision lumpectomy.  Wt Readings from Last 3 Encounters:  03/12/16 200 lb (90.719 kg)  02/13/16 201 lb 6.4 oz (91.354 kg)  11/10/15 212 lb 9.6 oz (96.435 kg)     Review of Systems    see HPI  Past Medical History  Diagnosis Date  . GERD (gastroesophageal reflux disease)   . Benign microscopic hematuria     neg cystoscopy 11/12- dr. Janice Norrie  . Hypertension   . Hyperlipidemia   . Shortness of breath     with  exertion  . Numbness and tingling in left arm   . DJD (degenerative joint disease)     L KNEE  . Family history of anesthesia complication     multiple family members with history of this  . Malignant hyperthermia      Social History   Social History  . Marital Status: Married    Spouse Name: N/A  . Number of Children: 3  . Years of Education: N/A     Occupational History  . Not on file.   Social History Main Topics  . Smoking status: Current Every Day Smoker -- 0.50 packs/day for 39 years    Types: Cigarettes  . Smokeless tobacco: Never Used     Comment: 1 1/2 cigarettes daily  . Alcohol Use: No  . Drug Use: No  . Sexual Activity:    Partners: Male   Other Topics Concern  . Not on file   Social History Narrative   Regular exercise:  No   Caffeine Use:  2 cups coffee and 3-4 glasses of soda daily.   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    Past Surgical History  Procedure Laterality Date  . Knee cartilage surgery  1999    right knee  . Abdominal hysterectomy  1996  . Cervical disc surgery  2013    Dr. Arnoldo Morale  . Rotator cuff repair Right 05/25/13  . Tubal ligation    . Total knee arthroplasty Left 01/20/2014    Procedure: LEFT TOTAL KNEE ARTHROPLASTY;  Surgeon: Johnn Hai, MD;  Location: [facility];  Service: Orthopedics;  Laterality: Left;  . Dilation and curettage of uterus    . Total knee arthroplasty  Right 01/19/2015    Procedure: RIGHT TOTAL KNEE ARTHROPLASTY;  Surgeon: Johnn Hai, MD;  Location: [facility];  Service: Orthopedics;  Laterality: Right;    Family History  Problem Relation Age of Onset  . Hypertension Mother   . Hyperlipidemia Mother   . Heart disease Father     CAD; stent at age 71  . Diabetes Father   . Hypertension Father   . Hyperlipidemia Father   . Cancer Father     prostate  . Cancer Paternal Aunt     BREAST  . Dementia Father     Allergies  Allergen Reactions  . Shrimp [Shellfish Allergy] Itching    Rash and lips itching  . Ace Inhibitors     Cough  . Aspirin Nausea And Vomiting  . Azithromycin Rash    Current Outpatient Prescriptions on File Prior to Visit  Medication Sig Dispense Refill  . ezetimibe (ZETIA) 10 MG tablet Take 1 tablet (10 mg total) by mouth daily. 30 tablet 5  . fluticasone (FLONASE) 50  MCG/ACT nasal spray Place 2 sprays into both nostrils daily. 16 g 2  . gabapentin (NEURONTIN) 300 MG capsule TAKE 1 CAPSULE BY MOUTH IN THE MORNING, 1 AT NOON, AND 2 AT BEDTIME 120 capsule 2  . Ibuprofen-Famotidine (DUEXIS) 800-26.6 MG TABS Take 1 tablet by mouth at bedtime as needed.    Marland Kitchen LINZESS 145 MCG CAPS capsule TAKE 1 CAPSULE (145 MCG TOTAL) BY MOUTH DAILY. 30 capsule 5  . pantoprazole (PROTONIX) 40 MG tablet Take 1 tablet (40 mg total) by mouth daily. 30 tablet 3  . potassium chloride SA (K-DUR,KLOR-CON) 20 MEQ tablet TAKE 1 TABLET (20 MEQ TOTAL) BY MOUTH DAILY. 30 tablet 5  . rosuvastatin (CRESTOR) 40 MG tablet Take 1 tablet (40 mg total) by mouth daily. 30 tablet 3  . amLODipine (NORVASC) 10 MG tablet Take 1 tablet (10 mg total) by mouth every morning. 30 tablet 5  . fluconazole (DIFLUCAN) 150 MG tablet 1 tab by mouth today, may repeat in 3 days if symptoms are not improved. (Patient not taking: Reported on 03/12/2016) 2 tablet 0   No current facility-administered medications on file prior to visit.    BP 130/70 mmHg  Pulse 73  Temp(Src) 98.5 F (36.9 C) (Oral)  Resp 18  Ht 5\' 6"  (1.676 m)  Wt 200 lb (90.719 kg)  BMI 32.30 kg/m2  SpO2 100%  LMP 11/25/1994    Objective:   Physical Exam  Constitutional: She is oriented to person, place, and time. She appears well-developed and well-nourished.  HENT:  Head: Normocephalic and atraumatic.  Right Ear: Tympanic membrane and ear canal normal.  Mouth/Throat: No oropharyngeal exudate, posterior oropharyngeal edema or posterior oropharyngeal erythema.  L TM- decreased bony landmarks  Cardiovascular: Normal rate, regular rhythm and normal heart sounds.   No murmur heard. Pulmonary/Chest: Effort normal and breath sounds normal. No respiratory distress. She has no wheezes.  Abdominal: Soft. Bowel sounds are normal.  Musculoskeletal: She exhibits no edema.  Neurological: She is alert and oriented to person, place, and time.    Psychiatric: She has a normal mood and affect. Her behavior is normal. Judgment and thought content normal.  breast:  Left breast abscess is nearly resolved.  Small amount of induration noted beneath left areola       Assessment & Plan:  Nausea- ? Mild viral gastroenteritis. Will give dose of IM zofran here in the office. Will follow up with prn PO zofran. Advised continued  hydration. Call if symptoms worsen or do not improve in 24 hrs. Advised pt if not feeling better tomorrow she should contact Dr. Sharin Grave office and cancel her upcoming breast excision. Will obtain cmet, CBC, UA/culture to further assess.  Palpitations- EKG is performed and personally reviewed.  Notes some ectopic ventricular beats, LVH, otherwise no acute findings noted.   Breast abscess- improved. Pt is scheduled for surgical excision.

## 2016-03-12 NOTE — Progress Notes (Signed)
Pre visit review using our clinic review tool, if applicable. No additional management support is needed unless otherwise documented below in the visit note. 

## 2016-03-12 NOTE — Patient Instructions (Signed)
Please complete lab work prior to leaving. For nausea, you may use zofran every 8 hours as needed. If you are not feeling back to normal tomorrow, I would recommend that you contact Dr. Adair Laundry to cancel your upcoming procedure. Drink plenty of fluids.   Call if new/worsening symptoms, or if symptoms are not improved in 24 hours.

## 2016-03-14 ENCOUNTER — Telehealth: Payer: Self-pay | Admitting: Family

## 2016-03-14 DIAGNOSIS — E876 Hypokalemia: Secondary | ICD-10-CM

## 2016-03-14 LAB — URINE CULTURE
Colony Count: NO GROWTH
Organism ID, Bacteria: NO GROWTH

## 2016-03-14 NOTE — Assessment & Plan Note (Signed)
Stable on PPI, continue same.  

## 2016-03-14 NOTE — Telephone Encounter (Signed)
Urine culture is negative.  Potassium is low. Has she missed doses of K+?  I would like her to take 2 tabs by mouth today, then one tab  By mouth daily, repeat bmet in 1 week, dx hypokalemia.  How is she feeling?

## 2016-03-15 NOTE — Telephone Encounter (Signed)
Notified pt of below and she has been forgetting to take potassium. She was told by her surgeon that it was low and she states she already resumed daily dose. Pt states she had lumpectomy yesterday and feels ok except for a productive cough x 1 week with clear sputum. Wellsite geologist gave her a breathing treatment yesterday and cough was improved until today.  Gave pt options for contacting urgent care over the weekend if cough worsens / not improved and she voices understanding.

## 2016-03-20 ENCOUNTER — Encounter: Payer: Self-pay | Admitting: Family

## 2016-03-20 ENCOUNTER — Other Ambulatory Visit (INDEPENDENT_AMBULATORY_CARE_PROVIDER_SITE_OTHER): Payer: BLUE CROSS/BLUE SHIELD

## 2016-03-20 DIAGNOSIS — E876 Hypokalemia: Secondary | ICD-10-CM | POA: Diagnosis not present

## 2016-03-20 LAB — BASIC METABOLIC PANEL
BUN: 9 mg/dL (ref 6–23)
CO2: 24 mEq/L (ref 19–32)
Calcium: 9.4 mg/dL (ref 8.4–10.5)
Chloride: 106 mEq/L (ref 96–112)
Creatinine, Ser: 0.8 mg/dL (ref 0.40–1.20)
GFR: 93.59 mL/min (ref >60.00)
Glucose, Bld: 119 mg/dL — ABNORMAL HIGH (ref 70–99)
Potassium: 4.2 mEq/L (ref 3.5–5.1)
Sodium: 139 mEq/L (ref 135–145)

## 2016-03-25 HISTORY — PX: BREAST EXCISIONAL BIOPSY: SUR124

## 2016-03-29 IMAGING — CR DG CHEST 2V
2 series · 2 of 2 positions shown · non-contrast
Comparison: 08/20/2013.

CLINICAL DATA: 58-year-old female preoperative study for knee
surgery. Current history of smoking and hypertension. Initial
encounter.

EXAM:
CHEST  2 VIEW

[w chest pa]
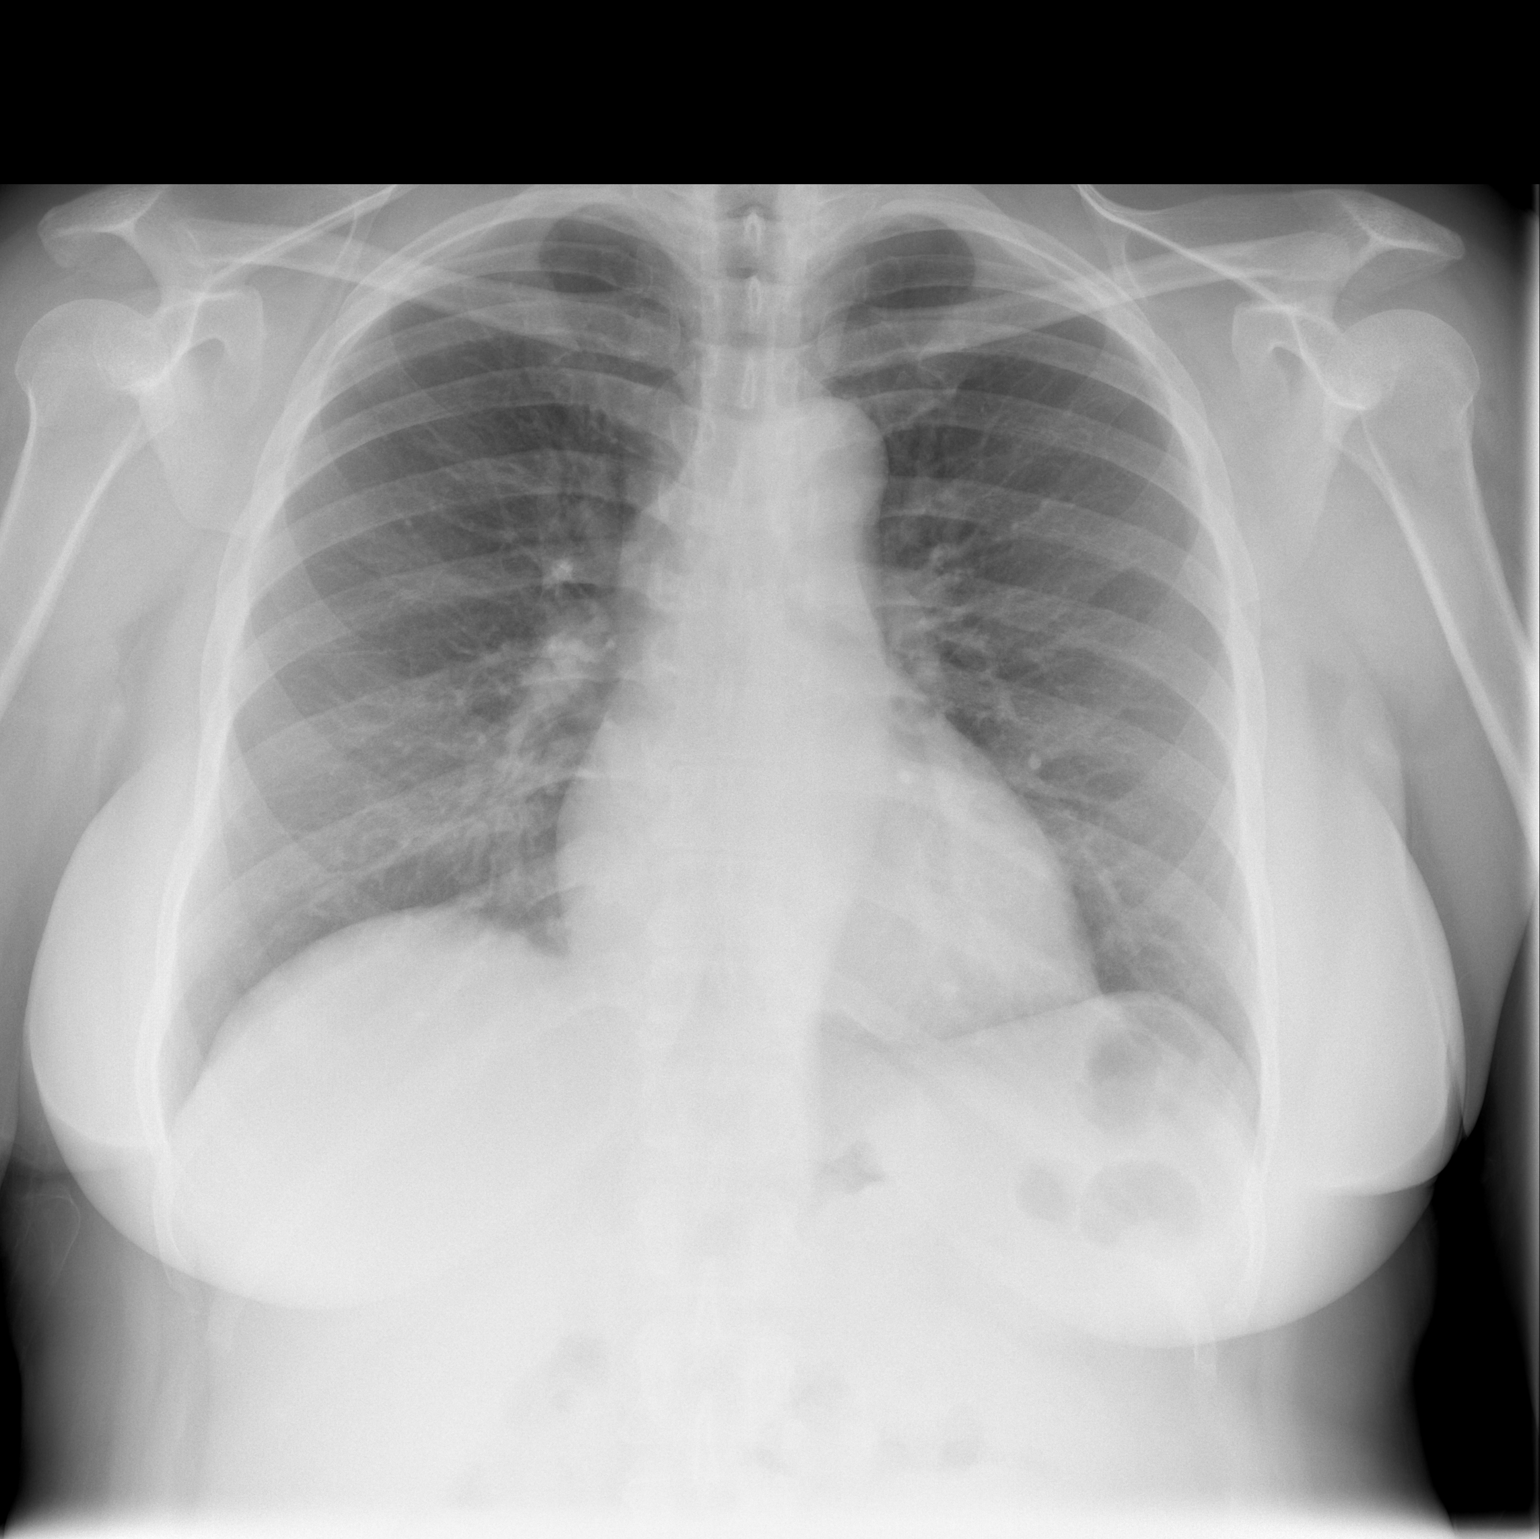

[w chest lat]
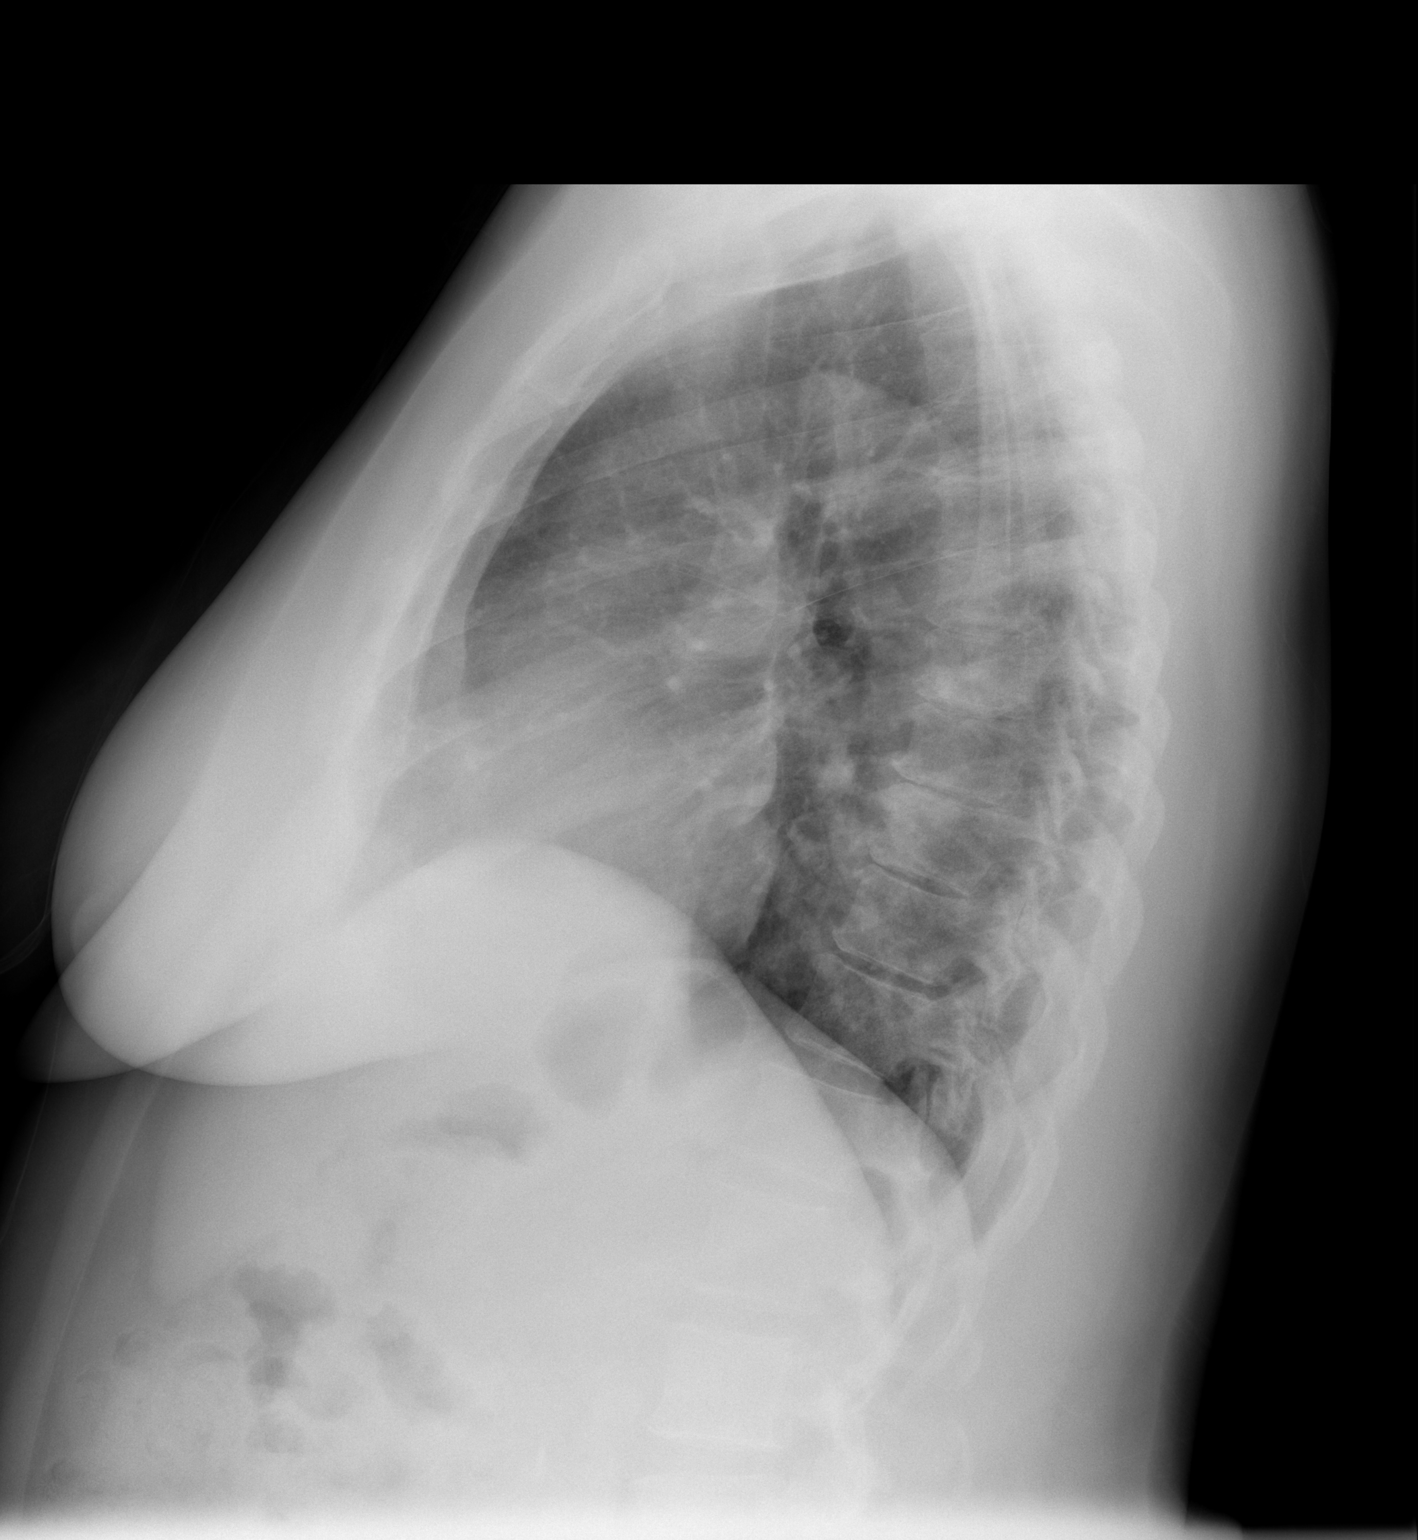

[2 of 2 positions shown; findings below may reference images not displayed]

FINDINGS: Lung volumes are within normal limits. Cardiac size at the upper
limits of normal. Other mediastinal contours are within normal
limits. Visualized tracheal air column is within normal limits. No
pneumothorax, pulmonary edema, pleural effusion or confluent
pulmonary opacity. Stable mild perihilar increased interstitial
markings. Cervical ACDF hardware re - identified. No acute osseous
abnormality identified. Calcified atherosclerosis of the abdominal
aorta.
IMPRESSION: No acute cardiopulmonary abnormality.

## 2016-04-03 ENCOUNTER — Encounter: Payer: Self-pay | Admitting: Family

## 2016-04-03 ENCOUNTER — Ambulatory Visit (INDEPENDENT_AMBULATORY_CARE_PROVIDER_SITE_OTHER): Payer: BLUE CROSS/BLUE SHIELD | Admitting: Family

## 2016-04-03 VITALS — BP 130/88 | HR 80 | Temp 97.7°F | Ht 66.0 in | Wt 201.8 lb

## 2016-04-03 DIAGNOSIS — M25512 Pain in left shoulder: Secondary | ICD-10-CM | POA: Diagnosis not present

## 2016-04-03 MED ORDER — MELOXICAM 7.5 MG PO TABS: 7.5000 mg | ORAL_TABLET | Freq: Every day | ORAL | Status: DC

## 2016-04-03 MED FILL — MELOXICAM 7.5 MG TABLET: 7.5 | 14 days supply | Qty: 14 | Fill #0

## 2016-04-03 NOTE — Patient Instructions (Addendum)
Start meloxicam once daily. Call if symptoms worsen or if symptoms do not improve in 2 weeks.

## 2016-04-03 NOTE — Progress Notes (Signed)
Pre visit review using our clinic review tool, if applicable. No additional management support is needed unless otherwise documented below in the visit note. 

## 2016-04-03 NOTE — Progress Notes (Signed)
Subjective:    Patient ID: Jade [name], female    DOB: [birth year], 60 y.o.   MRN: [id number]  HPI  [name] is a 60 yr old female who presents today with chief complaint of left arm pain. Pain can be "shooting" in nature.  She reports that the arm pain has been going on for about a month.  Heating pad helps some.  Worse if she moves her arm in certain positions. Denies injury to the left arm    Review of Systems See HPI  Past Medical History  Diagnosis Date  . GERD (gastroesophageal reflux disease)   . Benign microscopic hematuria     neg cystoscopy 11/12- dr. Janice Norrie  . Hypertension   . Hyperlipidemia   . Shortness of breath     with  exertion  . Numbness and tingling in left arm   . DJD (degenerative joint disease)     L KNEE  . Family history of anesthesia complication     multiple family members with history of this  . Malignant hyperthermia      Social History   Social History  . Marital Status: Married    Spouse Name: N/A  . Number of Children: 3  . Years of Education: N/A   Occupational History  . Not on file.   Social History Main Topics  . Smoking status: Current Every Day Smoker -- 0.50 packs/day for 39 years    Types: Cigarettes  . Smokeless tobacco: Never Used     Comment: 1 1/2 cigarettes daily  . Alcohol Use: No  . Drug Use: No  . Sexual Activity:    Partners: Male   Other Topics Concern  . Not on file   Social History Narrative   Regular exercise:  No   Caffeine Use:  2 cups coffee and 3-4 glasses of soda daily.   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    Past Surgical History  Procedure Laterality Date  . Knee cartilage surgery  1999    right knee  . Abdominal hysterectomy  1996  . Cervical disc surgery  2013    Dr. Arnoldo Morale  . Rotator cuff repair Right 05/25/13  . Tubal ligation    . Total knee arthroplasty Left 01/20/2014    Procedure: LEFT TOTAL KNEE ARTHROPLASTY;   Surgeon: Johnn Hai, MD;  Location: [facility];  Service: Orthopedics;  Laterality: Left;  . Dilation and curettage of uterus    . Total knee arthroplasty Right 01/19/2015    Procedure: RIGHT TOTAL KNEE ARTHROPLASTY;  Surgeon: Johnn Hai, MD;  Location: [facility];  Service: Orthopedics;  Laterality: Right;    Family History  Problem Relation Age of Onset  . Hypertension Mother   . Hyperlipidemia Mother   . Heart disease Father     CAD; stent at age 62  . Diabetes Father   . Hypertension Father   . Hyperlipidemia Father   . Cancer Father     prostate  . Cancer Paternal Aunt     BREAST  . Dementia Father     Allergies  Allergen Reactions  . Shrimp [Shellfish Allergy] Itching    Rash and lips itching  . Ace Inhibitors     Cough  . Aspirin Nausea And Vomiting  . Azithromycin Rash    Current Outpatient Prescriptions on File Prior to Visit  Medication Sig Dispense Refill  . amLODipine (  NORVASC) 10 MG tablet Take 1 tablet (10 mg total) by mouth every morning. 30 tablet 5  . fluticasone (FLONASE) 50 MCG/ACT nasal spray Place 2 sprays into both nostrils daily. 16 g 2  . gabapentin (NEURONTIN) 300 MG capsule TAKE 1 CAPSULE BY MOUTH IN THE MORNING, 1 AT NOON, AND 2 AT BEDTIME 120 capsule 2  . Ibuprofen-Famotidine (DUEXIS) 800-26.6 MG TABS Take 1 tablet by mouth at bedtime as needed.    Marland Kitchen LINZESS 145 MCG CAPS capsule TAKE 1 CAPSULE (145 MCG TOTAL) BY MOUTH DAILY. 30 capsule 5  . ondansetron (ZOFRAN ODT) 4 MG disintegrating tablet Take 1 tablet (4 mg total) by mouth every 8 (eight) hours as needed for nausea or vomiting. 20 tablet 0  . pantoprazole (PROTONIX) 40 MG tablet Take 1 tablet (40 mg total) by mouth daily. 30 tablet 3  . potassium chloride SA (K-DUR,KLOR-CON) 20 MEQ tablet TAKE 1 TABLET (20 MEQ TOTAL) BY MOUTH DAILY. 30 tablet 5  . rosuvastatin (CRESTOR) 40 MG tablet Take 1 tablet (40 mg total) by mouth daily. 30 tablet 3  . ezetimibe (ZETIA) 10 MG tablet Take 1 tablet (10 mg  total) by mouth daily. (Patient not taking: Reported on 04/03/2016) 30 tablet 5   No current facility-administered medications on file prior to visit.    BP 130/88 mmHg  Pulse 80  Temp(Src) 97.7 F (36.5 C) (Oral)  Ht 5\' 6"  (1.676 m)  Wt 201 lb 12.8 oz (91.536 kg)  BMI 32.59 kg/m2  SpO2 100%  LMP 11/25/1994       Objective:   Physical Exam  Constitutional: She appears well-developed and well-nourished.  Cardiovascular: Normal rate, regular rhythm and normal heart sounds.   No murmur heard. Pulmonary/Chest: Effort normal and breath sounds normal. No respiratory distress. She has no wheezes.  Musculoskeletal:  Left shoulder pain to palpation No shoulder swelling noted.  + pain with left arm extension + pain with "empty can" left  Psychiatric: She has a normal mood and affect. Her behavior is normal. Judgment and thought content normal.          Assessment & Plan:  Shoulder pain- will rx with meloxicam. Advised pt to let me know if symptoms worsen or if symptoms are not improved in 2 weeks. Will plan referral to orthopedics at that time.

## 2016-04-11 ENCOUNTER — Other Ambulatory Visit: Payer: Self-pay | Admitting: Family

## 2016-04-11 MED FILL — GABAPENTIN 300 MG CAPSULE: 300 | 30 days supply | Qty: 120 | Fill #2

## 2016-04-11 MED FILL — FLUTICASONE PROP 50 MCG SPR: 50 | 30 days supply | Qty: 16 | Fill #0

## 2016-04-11 MED FILL — LINZESS 145 MCG CAPSULE: 145 | 30 days supply | Qty: 30 | Fill #3

## 2016-04-11 MED FILL — AMLODIPINE BESYLATE 10 MG T: 10 | 30 days supply | Qty: 30 | Fill #0

## 2016-04-11 MED FILL — PANTOPRAZOLE SOD DR 40 MG T: 40 | 30 days supply | Qty: 30 | Fill #2

## 2016-04-11 MED FILL — POTASSIUM CL ER 20 MEQ TABL: 20 | 30 days supply | Qty: 30 | Fill #2

## 2016-05-09 ENCOUNTER — Other Ambulatory Visit: Payer: Self-pay | Admitting: Family

## 2016-05-09 MED FILL — POTASSIUM CL ER 20 MEQ TABL: 20 | 30 days supply | Qty: 30 | Fill #3

## 2016-05-09 MED FILL — FLUTICASONE PROP 50 MCG SPR: 50 | 30 days supply | Qty: 16 | Fill #1

## 2016-05-09 MED FILL — AMLODIPINE BESYLATE 10 MG T: 10 | 30 days supply | Qty: 30 | Fill #1

## 2016-05-09 MED FILL — GABAPENTIN 300 MG CAPSULE: 300 | 30 days supply | Qty: 120 | Fill #0

## 2016-05-09 MED FILL — LINZESS 145 MCG CAPSULE: 145 | 30 days supply | Qty: 30 | Fill #4

## 2016-05-09 MED FILL — PANTOPRAZOLE SOD DR 40 MG T: 40 | 30 days supply | Qty: 30 | Fill #3

## 2016-06-06 ENCOUNTER — Other Ambulatory Visit: Payer: Self-pay | Admitting: Family

## 2016-06-06 MED FILL — FLUTICASONE PROP 50 MCG SPR: 50 | 30 days supply | Qty: 16 | Fill #0

## 2016-06-06 MED FILL — POTASSIUM CL ER 20 MEQ TABL: 20 | 30 days supply | Qty: 30 | Fill #4

## 2016-06-06 MED FILL — GABAPENTIN 300 MG CAPSULE: 300 | 30 days supply | Qty: 120 | Fill #1

## 2016-06-06 MED FILL — LINZESS 145 MCG CAPSULE: 145 | 30 days supply | Qty: 30 | Fill #5

## 2016-06-06 MED FILL — PANTOPRAZOLE SOD DR 40 MG T: 40 | 30 days supply | Qty: 30 | Fill #0

## 2016-06-06 MED FILL — AMLODIPINE BESYLATE 10 MG T: 10 | 30 days supply | Qty: 30 | Fill #2

## 2016-06-19 ENCOUNTER — Other Ambulatory Visit (HOSPITAL_COMMUNITY)
Admission: RE | Admit: 2016-06-19 | Discharge: 2016-06-19 | Disposition: A | Discharge status: Home / Self Care | Payer: BLUE CROSS/BLUE SHIELD | Source: Ambulatory Visit | Attending: Family | Admitting: Family

## 2016-06-19 ENCOUNTER — Ambulatory Visit (INDEPENDENT_AMBULATORY_CARE_PROVIDER_SITE_OTHER): Payer: BLUE CROSS/BLUE SHIELD | Admitting: Family

## 2016-06-19 ENCOUNTER — Encounter: Payer: Self-pay | Admitting: Family

## 2016-06-19 VITALS — BP 118/60 | HR 77 | Temp 98.1°F | Resp 18 | Ht 66.0 in

## 2016-06-19 DIAGNOSIS — N898 Other specified noninflammatory disorders of vagina: Secondary | ICD-10-CM | POA: Diagnosis not present

## 2016-06-19 DIAGNOSIS — R739 Hyperglycemia, unspecified: Secondary | ICD-10-CM

## 2016-06-19 DIAGNOSIS — R300 Dysuria: Secondary | ICD-10-CM

## 2016-06-19 DIAGNOSIS — Z113 Encounter for screening for infections with a predominantly sexual mode of transmission: Secondary | ICD-10-CM | POA: Diagnosis not present

## 2016-06-19 DIAGNOSIS — N760 Acute vaginitis: Secondary | ICD-10-CM | POA: Diagnosis present

## 2016-06-19 DIAGNOSIS — N10 Acute tubulo-interstitial nephritis: Secondary | ICD-10-CM | POA: Diagnosis not present

## 2016-06-19 LAB — POCT URINALYSIS DIPSTICK
Bilirubin, UA: NEGATIVE
Blood, UA: NEGATIVE
Glucose, UA: NEGATIVE
Ketones, UA: NEGATIVE
Leukocytes, UA: NEGATIVE
Nitrite, UA: NEGATIVE
Protein, UA: NEGATIVE
Spec Grav, UA: >=1.03
Urobilinogen, UA: NEGATIVE
pH, UA: 5.5

## 2016-06-19 MED ORDER — CIPROFLOXACIN HCL 500 MG PO TABS: 500.0000 mg | ORAL_TABLET | Freq: Two times a day (BID) | ORAL | 0 refills | Status: DC

## 2016-06-19 MED FILL — CIPROFLOXACIN HCL 500 MG TA: 500 | 7 days supply | Qty: 14 | Fill #0

## 2016-06-19 NOTE — Progress Notes (Signed)
Pre visit review using our clinic review tool, if applicable. No additional management support is needed unless otherwise documented below in the visit note. 

## 2016-06-19 NOTE — Progress Notes (Signed)
Subjective:    Patient ID: Jade [name], female    DOB: 05-Nov-1956, 60 y.o.   MRN: [id number]  HPI  [name] is a 60 yr old female who presents today with chief complaint of left sided low back pain.  Back pain has been present for 1 week.  Reports associated dysuria.  Reports that she has suprapubic tenderness. She reports mild vaginal discharge.   Lab Results  Component Value Date   HGBA1C 6.0 08/08/2015    Review of Systems    see HPI  Past Medical History:  Diagnosis Date  . Benign microscopic hematuria    neg cystoscopy 11/12- dr. Janice Norrie  . DJD (degenerative joint disease)    L KNEE  . Family history of anesthesia complication    multiple family members with history of this  . GERD (gastroesophageal reflux disease)   . Hyperlipidemia   . Hypertension   . Malignant hyperthermia   . Numbness and tingling in left arm   . Shortness of breath    with  exertion     Social History   Social History  . Marital status: Married    Spouse name: N/A  . Number of children: 3  . Years of education: N/A   Occupational History  . Not on file.   Social History Main Topics  . Smoking status: Current Every Day Smoker    Packs/day: 0.50    Years: 39.00    Types: Cigarettes  . Smokeless tobacco: Never Used     Comment: 1 1/2 cigarettes daily  . Alcohol use No  . Drug use: No  . Sexual activity: Yes    Partners: Male   Other Topics Concern  . Not on file   Social History Narrative   Regular exercise:  No   Caffeine Use:  2 cups coffee and 3-4 glasses of soda daily.   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    Past Surgical History:  Procedure Laterality Date  . ABDOMINAL HYSTERECTOMY  1996  . CERVICAL DISC SURGERY  2013   Dr. Arnoldo Morale  . DILATION AND CURETTAGE OF UTERUS    . KNEE CARTILAGE SURGERY  1999   right knee  . ROTATOR CUFF REPAIR Right 05/25/13  . TOTAL KNEE ARTHROPLASTY Left 01/20/2014    Procedure: LEFT TOTAL KNEE ARTHROPLASTY;  Surgeon: Johnn Hai, MD;  Location: [facility];  Service: Orthopedics;  Laterality: Left;  . TOTAL KNEE ARTHROPLASTY Right 01/19/2015   Procedure: RIGHT TOTAL KNEE ARTHROPLASTY;  Surgeon: Johnn Hai, MD;  Location: [facility];  Service: Orthopedics;  Laterality: Right;  . TUBAL LIGATION      Family History  Problem Relation Age of Onset  . Hypertension Mother   . Hyperlipidemia Mother   . Heart disease Father     CAD; stent at age 11  . Diabetes Father   . Hypertension Father   . Hyperlipidemia Father   . Cancer Father     prostate  . Cancer Paternal Aunt     BREAST  . Dementia Father     Allergies  Allergen Reactions  . Shrimp [Shellfish Allergy] Itching    Rash and lips itching  . Ace Inhibitors     Cough  . Aspirin Nausea And Vomiting  . Azithromycin Rash    Current Outpatient Prescriptions on File Prior to Visit  Medication Sig Dispense Refill  . amLODipine (NORVASC) 10  MG tablet TAKE 1 TABLET (10 MG TOTAL) BY MOUTH EVERY MORNING. 30 tablet 5  . cetirizine (ZYRTEC) 10 MG tablet Take 10 mg by mouth as needed.    . fluticasone (FLONASE) 50 MCG/ACT nasal spray PLACE 2 SPRAYS INTO BOTH NOSTRILS DAILY. 16 g 1  . gabapentin (NEURONTIN) 300 MG capsule TAKE 1 CAPSULE BY MOUTH DAILY IN THE MORNING, 1 AT NOON, AND 2 AT BEDTIME 120 capsule 5  . Ibuprofen-Famotidine (DUEXIS) 800-26.6 MG TABS Take 1 tablet by mouth at bedtime as needed.    Marland Kitchen LINZESS 145 MCG CAPS capsule TAKE 1 CAPSULE (145 MCG TOTAL) BY MOUTH DAILY. 30 capsule 5  . ondansetron (ZOFRAN ODT) 4 MG disintegrating tablet Take 1 tablet (4 mg total) by mouth every 8 (eight) hours as needed for nausea or vomiting. 20 tablet 0  . pantoprazole (PROTONIX) 40 MG tablet TAKE 1 TABLET (40 MG TOTAL) BY MOUTH DAILY. 30 tablet 3  . potassium chloride SA (K-DUR,KLOR-CON) 20 MEQ tablet TAKE 1 TABLET (20 MEQ TOTAL) BY MOUTH DAILY. 30 tablet 5  . ezetimibe (ZETIA) 10 MG tablet Take 1 tablet  (10 mg total) by mouth daily. (Patient not taking: Reported on 06/19/2016) 30 tablet 5   No current facility-administered medications on file prior to visit.     BP 118/60   Pulse 77   Temp 98.1 F (36.7 C) (Oral)   Resp 18   Ht 5\' 6"  (1.676 m)   LMP 11/25/1994   SpO2 98% Comment: room air   Objective:   Physical Exam  Constitutional: She appears well-developed and well-nourished.  Cardiovascular: Normal rate, regular rhythm and normal heart sounds.   No murmur heard. Pulmonary/Chest: Effort normal and breath sounds normal. No respiratory distress. She has no wheezes.  Abdominal: Soft. She exhibits no distension. There is tenderness in the suprapubic area.  Psychiatric: She has a normal mood and affect. Her behavior is normal. Judgment and thought content normal.  GU: + left CVAT      Assessment & Plan:  Acute pyelonephritis. Will rx with Cipro. Pt is advised as follows:   Call if symptoms worsen, if you you develop fever, if blood in the urine or if not improved in 2-3 days.  Complete lab work prior to leaving.

## 2016-06-19 NOTE — Patient Instructions (Signed)
Please begin cipro (antibiotic) for urinary tract infection. Call if symptoms worsen, if you you develop fever, if blood in the urine or if not improved in 2-3 days.  Complete lab work prior to leaving.

## 2016-06-20 ENCOUNTER — Encounter: Payer: Self-pay | Admitting: Family

## 2016-06-20 LAB — HEMOGLOBIN A1C: Hgb A1c MFr Bld: 6.1 % (ref 4.6–6.5)

## 2016-06-21 LAB — URINE CULTURE

## 2016-06-21 LAB — URINE CYTOLOGY ANCILLARY ONLY
Chlamydia: NEGATIVE
Neisseria Gonorrhea: NEGATIVE
Trichomonas: NEGATIVE

## 2016-06-24 ENCOUNTER — Telehealth: Payer: Self-pay | Admitting: *Deleted

## 2016-06-24 LAB — URINE CYTOLOGY ANCILLARY ONLY
Bacterial vaginitis: NEGATIVE
Candida vaginitis: NEGATIVE

## 2016-06-24 NOTE — Telephone Encounter (Signed)
-----   Message from Debbrah Alar, NP sent at 06/23/2016  8:56 AM EDT ----- Lab work is negative for chlamydia, gonorrhea or trichomonas.

## 2016-06-24 NOTE — Telephone Encounter (Signed)
Notified pt and she states she is still having vaginal burning and wonders if she needs medication for yeast?  Please advise?

## 2016-06-25 NOTE — Telephone Encounter (Signed)
NOtified pt and she voices understanding.

## 2016-06-25 NOTE — Telephone Encounter (Signed)
We tested for yeast and that was negative.  Let me know if vaginal burning does not improve in the next week or so and we can repeat testing.

## 2016-06-26 ENCOUNTER — Encounter: Payer: Self-pay | Admitting: Family

## 2016-07-05 ENCOUNTER — Other Ambulatory Visit: Payer: Self-pay | Admitting: Family

## 2016-07-05 MED FILL — AMLODIPINE BESYLATE 10 MG T: 10 | 30 days supply | Qty: 30 | Fill #3

## 2016-07-05 MED FILL — LINZESS 145 MCG CAPSULE: 145 | 30 days supply | Qty: 30 | Fill #0

## 2016-07-05 MED FILL — GABAPENTIN 300 MG CAPSULE: 300 | 30 days supply | Qty: 120 | Fill #2

## 2016-07-05 MED FILL — POTASSIUM CL ER 20 MEQ TABL: 20 | 30 days supply | Qty: 30 | Fill #5

## 2016-07-05 MED FILL — FLUTICASONE PROP 50 MCG SPR: 50 | 30 days supply | Qty: 16 | Fill #1

## 2016-07-05 MED FILL — PANTOPRAZOLE SOD DR 40 MG T: 40 | 30 days supply | Qty: 30 | Fill #1

## 2016-08-06 MED FILL — PANTOPRAZOLE SOD DR 40 MG T: 40 | 30 days supply | Qty: 30 | Fill #2

## 2016-08-06 MED FILL — AMLODIPINE BESYLATE 10 MG T: 10 | 30 days supply | Qty: 30 | Fill #4

## 2016-08-06 MED FILL — GABAPENTIN 300 MG CAPSULE: 300 | 30 days supply | Qty: 120 | Fill #3

## 2016-08-06 MED FILL — LINZESS 145 MCG CAPSULE: 145 | 30 days supply | Qty: 30 | Fill #1

## 2016-09-06 ENCOUNTER — Other Ambulatory Visit: Payer: Self-pay | Admitting: Family

## 2016-09-06 MED FILL — PANTOPRAZOLE SOD DR 40 MG T: 40 | 30 days supply | Qty: 30 | Fill #3

## 2016-09-06 MED FILL — LINZESS 145 MCG CAPSULE: 145 | 30 days supply | Qty: 30 | Fill #2

## 2016-09-06 MED FILL — AMLODIPINE BESYLATE 10 MG T: 10 | 30 days supply | Qty: 30 | Fill #5

## 2016-09-06 MED FILL — GABAPENTIN 300 MG CAPSULE: 300 | 30 days supply | Qty: 120 | Fill #4

## 2016-10-01 ENCOUNTER — Ambulatory Visit: Payer: BLUE CROSS/BLUE SHIELD

## 2016-10-04 ENCOUNTER — Other Ambulatory Visit: Payer: Self-pay | Admitting: Family

## 2016-10-04 MED FILL — PANTOPRAZOLE SOD DR 40 MG T: 40 | 30 days supply | Qty: 30 | Fill #0

## 2016-10-04 MED FILL — LINZESS 145 MCG CAPSULE: 145 | 30 days supply | Qty: 30 | Fill #3

## 2016-10-04 MED FILL — GABAPENTIN 300 MG CAPSULE: 300 | 30 days supply | Qty: 120 | Fill #5

## 2016-10-04 MED FILL — POTASSIUM CL ER 20 MEQ TABL: 20 | 30 days supply | Qty: 30 | Fill #0

## 2016-10-04 MED FILL — AMLODIPINE BESYLATE 10 MG T: 10 | 30 days supply | Qty: 30 | Fill #0

## 2016-10-09 ENCOUNTER — Ambulatory Visit: Payer: BLUE CROSS/BLUE SHIELD

## 2016-10-09 ENCOUNTER — Ambulatory Visit (INDEPENDENT_AMBULATORY_CARE_PROVIDER_SITE_OTHER): Payer: BLUE CROSS/BLUE SHIELD | Admitting: Family

## 2016-10-09 ENCOUNTER — Encounter: Payer: Self-pay | Admitting: Family

## 2016-10-09 VITALS — BP 150/78 | HR 76 | Temp 98.6°F | Resp 16 | Ht 66.0 in | Wt 204.4 lb

## 2016-10-09 DIAGNOSIS — H6693 Otitis media, unspecified, bilateral: Secondary | ICD-10-CM | POA: Diagnosis not present

## 2016-10-09 DIAGNOSIS — E010 Iodine-deficiency related diffuse (endemic) goiter: Secondary | ICD-10-CM

## 2016-10-09 DIAGNOSIS — I10 Essential (primary) hypertension: Secondary | ICD-10-CM | POA: Diagnosis not present

## 2016-10-09 LAB — T4, FREE: Free T4: 0.93 ng/dL (ref 0.60–1.60)

## 2016-10-09 LAB — T3, FREE: T3, Free: 3.4 pg/mL (ref 2.3–4.2)

## 2016-10-09 LAB — TSH: TSH: 1.11 u[IU]/mL (ref 0.35–4.50)

## 2016-10-09 MED ORDER — AMOXICILLIN-POT CLAVULANATE 875-125 MG PO TABS: 1.0000 | ORAL_TABLET | Freq: Two times a day (BID) | ORAL | 0 refills | Status: DC

## 2016-10-09 MED ORDER — FLUTICASONE PROPIONATE 50 MCG/ACT NA SUSP: NASAL | 2 refills | Status: DC

## 2016-10-09 MED FILL — AMOX-CLAV 875-125 MG TABLET: 875-125 | 20 days supply | Qty: 20 | Fill #0

## 2016-10-09 MED FILL — FLUTICASONE PROP 50 MCG SPR: 50 | 30 days supply | Qty: 16 | Fill #0

## 2016-10-09 NOTE — Patient Instructions (Signed)
Please complete lab work prior to leaving. You will be contacted about scheduling your neck ultrasound. Begin augmentin for your ear infections. Call if new/worsening symptoms or if your symptoms are not improved in 1 week.

## 2016-10-09 NOTE — Progress Notes (Signed)
Subjective:    Patient ID: Jade [name], female    DOB: [birth year], 60 y.o.   MRN: [id number]  HPI  Jade [name] is a 60 yr old female who presents today with c/o nasal congestion and bilateral ear congestion. Symptoms have been present x 1 week. Denies associated fever.  Reports that her grandson had URI recently.  She notes that she has had some discharge from her eyes in the AM.  Denies fever. Nasal drainage is thick and colored.    Review of Systems    see HPI  Past Medical History:  Diagnosis Date  . Benign microscopic hematuria    neg cystoscopy 11/12- dr. Janice Norrie  . DJD (degenerative joint disease)    L KNEE  . Family history of anesthesia complication    multiple family members with history of this  . GERD (gastroesophageal reflux disease)   . Hyperlipidemia   . Hypertension   . Malignant hyperthermia   . Numbness and tingling in left arm   . Shortness of breath    with  exertion     Social History   Social History  . Marital status: Married    Spouse name: N/A  . Number of children: 3  . Years of education: N/A   Occupational History  . Not on file.   Social History Main Topics  . Smoking status: Current Every Day Smoker    Packs/day: 0.50    Years: 39.00    Types: Cigarettes  . Smokeless tobacco: Never Used     Comment: 1 1/2 cigarettes daily  . Alcohol use No  . Drug use: No  . Sexual activity: Yes    Partners: Male   Other Topics Concern  . Not on file   Social History Narrative   Regular exercise:  No   Caffeine Use:  2 cups coffee and 3-4 glasses of soda daily.   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    Past Surgical History:  Procedure Laterality Date  . ABDOMINAL HYSTERECTOMY  1996  . CERVICAL DISC SURGERY  2013   Dr. Arnoldo Morale  . DILATION AND CURETTAGE OF UTERUS    . KNEE CARTILAGE SURGERY  1999   right knee  . ROTATOR CUFF REPAIR Right 05/25/13  . TOTAL KNEE  ARTHROPLASTY Left 01/20/2014   Procedure: LEFT TOTAL KNEE ARTHROPLASTY;  Surgeon: Jade Hai, MD;  Location: [facility];  Service: Orthopedics;  Laterality: Left;  . TOTAL KNEE ARTHROPLASTY Right 01/19/2015   Procedure: RIGHT TOTAL KNEE ARTHROPLASTY;  Surgeon: Jade Hai, MD;  Location: [facility];  Service: Orthopedics;  Laterality: Right;  . TUBAL LIGATION      Family History  Problem Relation Age of Onset  . Hypertension Mother   . Hyperlipidemia Mother   . Heart disease Father     CAD; stent at age 2  . Diabetes Father   . Hypertension Father   . Hyperlipidemia Father   . Cancer Father     prostate  . Cancer Paternal Aunt     BREAST  . Dementia Father     Allergies  Allergen Reactions  . Shrimp [Shellfish Allergy] Itching    Rash and lips itching  . Ace Inhibitors     Cough  . Aspirin Nausea And Vomiting  . Azithromycin Rash    Current Outpatient Prescriptions on File Prior to Visit  Medication Sig Dispense Refill  .  amLODipine (NORVASC) 10 MG tablet TAKE 1 TABLET (10 MG TOTAL) BY MOUTH EVERY MORNING. 30 tablet 3  . cetirizine (ZYRTEC) 10 MG tablet Take 10 mg by mouth as needed.    . fluticasone (FLONASE) 50 MCG/ACT nasal spray PLACE 2 SPRAYS INTO BOTH NOSTRILS DAILY. 16 g 1  . gabapentin (NEURONTIN) 300 MG capsule TAKE 1 CAPSULE BY MOUTH DAILY IN THE MORNING, 1 AT NOON, AND 2 AT BEDTIME 120 capsule 5  . Ibuprofen-Famotidine (DUEXIS) 800-26.6 MG TABS Take 1 tablet by mouth at bedtime as needed.    Marland Kitchen LINZESS 145 MCG CAPS capsule TAKE 1 CAPSULE (145 MCG TOTAL) BY MOUTH DAILY. 30 capsule 5  . ondansetron (ZOFRAN ODT) 4 MG disintegrating tablet Take 1 tablet (4 mg total) by mouth every 8 (eight) hours as needed for nausea or vomiting. 20 tablet 0  . oxyCODONE-acetaminophen (PERCOCET/ROXICET) 5-325 MG tablet Take 1 tablet by mouth at bedtime as needed. Knee pain  0  . pantoprazole (PROTONIX) 40 MG tablet TAKE 1 TABLET (40 MG TOTAL) BY MOUTH DAILY. 30 tablet 3  . potassium  chloride SA (K-DUR,KLOR-CON) 20 MEQ tablet TAKE 1 TABLET (20 MEQ TOTAL) BY MOUTH DAILY. 30 tablet 5   No current facility-administered medications on file prior to visit.     BP (!) 150/78   Pulse 76   Temp 98.6 F (37 C) (Oral)   Resp 16   Ht 5\' 6"  (1.676 m)   Wt 204 lb 6.4 oz (92.7 kg)   LMP 11/25/1994   SpO2 100% Comment: RA  BMI 32.99 kg/m    Objective:   Physical Exam  Constitutional: She is oriented to person, place, and time. She appears well-developed and well-nourished.  HENT:  Right Ear: Ear canal normal. Tympanic membrane is injected.  Left Ear: Ear canal normal. Tympanic membrane is injected.  Bilateral TM's are dull  Eyes: Right eye exhibits no discharge. Left eye exhibits no discharge.  Cardiovascular: Normal rate, regular rhythm and normal heart sounds.   No murmur heard. Pulmonary/Chest: Effort normal and breath sounds normal. No respiratory distress. She has no wheezes.  Musculoskeletal: She exhibits no edema.  Lymphadenopathy:    She has no cervical adenopathy.  Neurological: She is alert and oriented to person, place, and time.  Skin: Skin is warm and dry.  Psychiatric: She has a normal mood and affect. Her behavior is normal. Judgment and thought content normal.          Assessment & Plan:  Bilateral OM- rx with augmentin. She may also have a sinus infection.   Thyromegaly- New. Will obtain thyroid US and TFT's.   HTN- BP a bit elevated. She has taken some recent decongestants. Will have her follow back up in 1 month for BP recheck.

## 2016-10-09 NOTE — Progress Notes (Signed)
Pre visit review using our clinic review tool, if applicable. No additional management support is needed unless otherwise documented below in the visit note. 

## 2016-10-11 ENCOUNTER — Ambulatory Visit (HOSPITAL_BASED_OUTPATIENT_CLINIC_OR_DEPARTMENT_OTHER)
Admission: RE | Admit: 2016-10-11 | Discharge: 2016-10-11 | Disposition: A | Discharge status: Home / Self Care | Payer: BLUE CROSS/BLUE SHIELD | Source: Ambulatory Visit | Attending: Family | Admitting: Family

## 2016-10-11 DIAGNOSIS — E010 Iodine-deficiency related diffuse (endemic) goiter: Secondary | ICD-10-CM | POA: Diagnosis present

## 2016-10-11 DIAGNOSIS — E042 Nontoxic multinodular goiter: Secondary | ICD-10-CM | POA: Insufficient documentation

## 2016-10-12 ENCOUNTER — Telehealth: Payer: Self-pay | Admitting: Family

## 2016-10-12 ENCOUNTER — Encounter: Payer: Self-pay | Admitting: Family

## 2016-10-12 DIAGNOSIS — E042 Nontoxic multinodular goiter: Secondary | ICD-10-CM

## 2016-10-12 NOTE — Telephone Encounter (Signed)
Please let patient know that her blood work shows thyroid function is normal, but her ultrasound shows some nodules.  I would like to refer her to endocrinology for further evaluation.

## 2016-10-14 NOTE — Telephone Encounter (Signed)
Notified pt and she is agreeable to referral. 

## 2016-10-16 ENCOUNTER — Ambulatory Visit (INDEPENDENT_AMBULATORY_CARE_PROVIDER_SITE_OTHER): Payer: BLUE CROSS/BLUE SHIELD

## 2016-10-16 DIAGNOSIS — Z23 Encounter for immunization: Secondary | ICD-10-CM

## 2016-11-04 ENCOUNTER — Other Ambulatory Visit: Payer: Self-pay | Admitting: Family

## 2016-11-04 MED FILL — PANTOPRAZOLE SOD DR 40 MG T: 40 | 30 days supply | Qty: 30 | Fill #1

## 2016-11-04 MED FILL — GABAPENTIN 300 MG CAPSULE: 300 | 30 days supply | Qty: 120 | Fill #0

## 2016-11-04 MED FILL — LINZESS 145 MCG CAPSULE: 145 | 30 days supply | Qty: 30 | Fill #4

## 2016-11-04 MED FILL — POTASSIUM CL ER 20 MEQ TABL: 20 | 30 days supply | Qty: 30 | Fill #1

## 2016-11-04 MED FILL — FLUTICASONE PROP 50 MCG SPR: 50 | 30 days supply | Qty: 16 | Fill #1

## 2016-11-04 MED FILL — AMLODIPINE BESYLATE 10 MG T: 10 | 30 days supply | Qty: 30 | Fill #1

## 2016-11-06 ENCOUNTER — Encounter: Payer: Self-pay | Admitting: Family

## 2016-11-06 ENCOUNTER — Other Ambulatory Visit (HOSPITAL_COMMUNITY)
Admission: RE | Admit: 2016-11-06 | Discharge: 2016-11-06 | Disposition: A | Discharge status: Home / Self Care | Payer: BLUE CROSS/BLUE SHIELD | Source: Ambulatory Visit | Attending: Family | Admitting: Family

## 2016-11-06 ENCOUNTER — Encounter: Payer: Self-pay | Admitting: Endocrinology

## 2016-11-06 ENCOUNTER — Ambulatory Visit (INDEPENDENT_AMBULATORY_CARE_PROVIDER_SITE_OTHER): Payer: BLUE CROSS/BLUE SHIELD | Admitting: Family

## 2016-11-06 VITALS — BP 125/82 | HR 77 | Temp 98.2°F | Resp 18 | Ht 66.0 in | Wt 204.2 lb

## 2016-11-06 DIAGNOSIS — L298 Other pruritus: Secondary | ICD-10-CM | POA: Insufficient documentation

## 2016-11-06 DIAGNOSIS — R300 Dysuria: Secondary | ICD-10-CM

## 2016-11-06 DIAGNOSIS — N898 Other specified noninflammatory disorders of vagina: Secondary | ICD-10-CM

## 2016-11-06 DIAGNOSIS — E785 Hyperlipidemia, unspecified: Secondary | ICD-10-CM

## 2016-11-06 DIAGNOSIS — I10 Essential (primary) hypertension: Secondary | ICD-10-CM

## 2016-11-06 DIAGNOSIS — N390 Urinary tract infection, site not specified: Secondary | ICD-10-CM

## 2016-11-06 DIAGNOSIS — N760 Acute vaginitis: Secondary | ICD-10-CM

## 2016-11-06 DIAGNOSIS — Z2911 Encounter for prophylactic immunotherapy for respiratory syncytial virus (RSV): Secondary | ICD-10-CM

## 2016-11-06 DIAGNOSIS — Z23 Encounter for immunization: Secondary | ICD-10-CM | POA: Diagnosis not present

## 2016-11-06 LAB — POC URINALSYSI DIPSTICK (AUTOMATED)
Blood, UA: NEGATIVE
Glucose, UA: NEGATIVE
Leukocytes, UA: NEGATIVE
Nitrite, UA: NEGATIVE
Spec Grav, UA: >=1.03
Urobilinogen, UA: NEGATIVE
pH, UA: 5

## 2016-11-06 MED ORDER — PRAVASTATIN SODIUM 40 MG PO TABS: 40.0000 mg | ORAL_TABLET | Freq: Every day | ORAL | 3 refills | Status: DC

## 2016-11-06 MED ORDER — CEPHALEXIN 500 MG PO CAPS: 500.0000 mg | ORAL_CAPSULE | Freq: Four times a day (QID) | ORAL | 0 refills | Status: DC

## 2016-11-06 MED FILL — CEPHALEXIN 500 MG CAPSULE: 500 | 5 days supply | Qty: 10 | Fill #0

## 2016-11-06 MED FILL — PRAVASTATIN NA 40 MG TAB: 40 | 30 days supply | Qty: 30 | Fill #0

## 2016-11-06 NOTE — Addendum Note (Signed)
Addended by: Caffie Pinto on: 11/06/2016 04:02 PM   Modules accepted: Orders

## 2016-11-06 NOTE — Progress Notes (Signed)
Subjective:    Patient ID: Jade [name], female    DOB: 25-Nov-1956, 60 y.o.   MRN: [id number]  HPI  Jade [name] is a 60 yr old female who presents today for follow up.  1) HTN- patient is maintained on amlodipine.   BP Readings from Last 3 Encounters:  11/06/16 125/82  10/09/16 (!) 150/78  06/19/16 118/60   2) Vaginal itching- reports + vaginal itching x 1 week. Denies known discharge.  + dysuria. Foul smelling urine. + Suprapubic discomfort. No new sexual partners.  3) Hyperlipidemia- has tried lipitor in the past. Not currently on statin.    Review of Systems  See HPI  Past Medical History:  Diagnosis Date  . Benign microscopic hematuria    neg cystoscopy 11/12- dr. Janice [name]  . DJD (degenerative joint disease)    L KNEE  . Family history of anesthesia complication    multiple family members with history of this  . GERD (gastroesophageal reflux disease)   . Hyperlipidemia   . Hypertension   . Malignant hyperthermia   . Numbness and tingling in left arm   . Shortness of breath    with  exertion     Social History   Social History  . Marital status: Married    Spouse name: N/A  . Number of children: 3  . Years of education: N/A   Occupational History  . Not on file.   Social History Main Topics  . Smoking status: Current Every Day Smoker    Packs/day: 0.50    Years: 39.00    Types: Cigarettes  . Smokeless tobacco: Never Used     Comment: 1 1/2 cigarettes daily  . Alcohol use No  . Drug use: No  . Sexual activity: Yes    Partners: Male   Other Topics Concern  . Not on file   Social History Narrative   Regular exercise:  No   Caffeine Use:  2 cups coffee and 3-4 glasses of soda daily.   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    Past Surgical History:  Procedure Laterality Date  . ABDOMINAL HYSTERECTOMY  1996  . CERVICAL DISC SURGERY  2013   Dr. Arnoldo [name]  . DILATION AND CURETTAGE OF  UTERUS    . KNEE CARTILAGE SURGERY  1999   right knee  . ROTATOR CUFF REPAIR Right 05/25/13  . TOTAL KNEE ARTHROPLASTY Left 01/20/2014   Procedure: LEFT TOTAL KNEE ARTHROPLASTY;  Surgeon: Jade Hai, MD;  Location: [facility];  Service: Orthopedics;  Laterality: Left;  . TOTAL KNEE ARTHROPLASTY Right 01/19/2015   Procedure: RIGHT TOTAL KNEE ARTHROPLASTY;  Surgeon: Jade Hai, MD;  Location: [facility];  Service: Orthopedics;  Laterality: Right;  . TUBAL LIGATION      Family History  Problem Relation Age of Onset  . Hypertension Mother   . Hyperlipidemia Mother   . Heart disease Father     CAD; stent at age 34  . Diabetes Father   . Hypertension Father   . Hyperlipidemia Father   . Cancer Father     prostate  . Cancer Paternal Aunt     BREAST  . Dementia Father     Allergies  Allergen Reactions  . Shrimp [Shellfish Allergy] Itching    Rash and lips itching  . Ace Inhibitors     Cough  . Aspirin Nausea And Vomiting  . Azithromycin Rash  Current Outpatient Prescriptions on File Prior to Visit  Medication Sig Dispense Refill  . amLODipine (NORVASC) 10 MG tablet TAKE 1 TABLET (10 MG TOTAL) BY MOUTH EVERY MORNING. 30 tablet 3  . cetirizine (ZYRTEC) 10 MG tablet Take 10 mg by mouth as needed.    . fluticasone (FLONASE) 50 MCG/ACT nasal spray PLACE 2 SPRAYS INTO BOTH NOSTRILS DAILY. 16 g 2  . gabapentin (NEURONTIN) 300 MG capsule TAKE 1 CAPSULE BY MOUTH DAILY IN THE MORNING, 1 AT NOON, AND 2 AT BEDTIME 120 capsule 5  . LINZESS 145 MCG CAPS capsule TAKE 1 CAPSULE (145 MCG TOTAL) BY MOUTH DAILY. 30 capsule 5  . ondansetron (ZOFRAN ODT) 4 MG disintegrating tablet Take 1 tablet (4 mg total) by mouth every 8 (eight) hours as needed for nausea or vomiting. 20 tablet 0  . pantoprazole (PROTONIX) 40 MG tablet TAKE 1 TABLET (40 MG TOTAL) BY MOUTH DAILY. 30 tablet 3  . potassium chloride SA (K-DUR,KLOR-CON) 20 MEQ tablet TAKE 1 TABLET (20 MEQ TOTAL) BY MOUTH DAILY. 30 tablet 5   No  current facility-administered medications on file prior to visit.     BP 125/82 (BP Location: Right Arm, Cuff Size: Normal)   Pulse 77   Temp 98.2 F (36.8 C) (Oral)   Resp 18   Ht 5\' 6"  (1.676 m)   Wt 204 lb 3.2 oz (92.6 kg)   LMP 11/25/1994   SpO2 98% Comment: room air  BMI 32.96 kg/m        Objective:   Physical Exam  Constitutional: She is oriented to person, place, and time. She appears well-developed and well-nourished.  HENT:  Head: Normocephalic and atraumatic.  Cardiovascular: Normal rate, regular rhythm and normal heart sounds.   No murmur heard. Pulmonary/Chest: Effort normal and breath sounds normal. No respiratory distress. She has no wheezes.  Genitourinary: There is no rash on the right labia. There is no rash on the left labia. No vaginal discharge found.  Musculoskeletal: She exhibits no edema.  Neurological: She is alert and oriented to person, place, and time.  Psychiatric: She has a normal mood and affect. Her behavior is normal. Judgment and thought content normal.          Assessment & Plan:  UTI- will send urine for culture, initiate keflex due to symptoms.   Vaginitis- wet prep sent to the lab for further evaluation, await results for further treatment options.

## 2016-11-06 NOTE — Progress Notes (Signed)
Pre visit review using our clinic review tool, if applicable. No additional management support is needed unless otherwise documented below in the visit note. 

## 2016-11-06 NOTE — Assessment & Plan Note (Signed)
>>  ASSESSMENT AND PLAN FOR DYSLIPIDEMIA WRITTEN ON 11/06/2016  3:46 PM BY O'SULLIVAN, Louis Ivery, NP  Uncontrolled. Will give a trial of pravastatin  to see if she can better tolerat.

## 2016-11-06 NOTE — Assessment & Plan Note (Signed)
BP stable on current medication, continue same.

## 2016-11-06 NOTE — Patient Instructions (Addendum)
Please begin keflex for urinary tract infection. Begin pravastatin for your cholesterol. Let me know if you have muscle/joint pain after starting pravastatin.  We will contact you with the results of the vaginal swab we are sending to the lab.

## 2016-11-06 NOTE — Assessment & Plan Note (Signed)
Uncontrolled. Will give a trial of pravastatin to see if she can better tolerat.

## 2016-11-07 LAB — CERVICOVAGINAL ANCILLARY ONLY: Wet Prep (BD Affirm): POSITIVE — AB

## 2016-11-07 LAB — URINE CULTURE: Organism ID, Bacteria: NO GROWTH

## 2016-11-08 ENCOUNTER — Telehealth: Payer: Self-pay | Admitting: Family

## 2016-11-08 MED ORDER — FLUCONAZOLE 150 MG PO TABS: ORAL_TABLET | ORAL | 0 refills | Status: DC

## 2016-11-08 MED FILL — FLUCONAZOLE 150 MG TABLET: 150 | 6 days supply | Qty: 2 | Fill #0

## 2016-11-08 NOTE — Telephone Encounter (Signed)
Please let pt know that lab work shows Yeast infection, rx sent for diflucan. Urine culture was neg. She can stop keflex.

## 2016-11-08 NOTE — Telephone Encounter (Signed)
Left message to return my call.  

## 2016-11-11 NOTE — Telephone Encounter (Signed)
Noted  

## 2016-11-11 NOTE — Telephone Encounter (Signed)
Called patient to discuss results and provider's recommendations. Pt states understanding and agreed with plan.  While on the phone she also said since she received the shingles vaccine she has been feeling kind of tired, congested, achy.  No fever, chest pain, or shortness of breath.  Pt was encouraged to be seen.  Offered several appts with our office and [facility].  Pt declined each.  Stating she doesn't do change well and would rather see [facility].  She was made aware that [facility] did not have any openings today or tomorrow.  She said instead she would go to [facility] in the morning per her preference.    Message routed to PCP for FYI.

## 2016-12-04 MED FILL — PANTOPRAZOLE SOD DR 40 MG T: 40 | 30 days supply | Qty: 30 | Fill #2

## 2016-12-04 MED FILL — AMLODIPINE BESYLATE 10 MG T: 10 | 30 days supply | Qty: 30 | Fill #2

## 2016-12-23 ENCOUNTER — Telehealth: Payer: Self-pay | Admitting: Family

## 2016-12-23 MED ORDER — AMOXICILLIN 500 MG PO CAPS: ORAL_CAPSULE | ORAL | 0 refills | Status: DC

## 2016-12-23 MED FILL — AMOXICILLIN 500 MG CAPSULE: 500 | 1 days supply | Qty: 4 | Fill #0

## 2016-12-23 NOTE — Telephone Encounter (Signed)
Caller name: Makaiya Relation to pt: self Call back number: [phone] [place]  Reason for call: Pt came in office and mentioned that she has an appt with her dentist today at 2:00 pm and that she had 2 knee replacements done, her dentist mentioned that she needs to get antibiotics before having her treatment at the dentist today, pt states dentist told her to get the antibiotics with her pcp. Please advise

## 2016-12-23 NOTE — Telephone Encounter (Signed)
rx sent. Pt notified by front desk staff.

## 2016-12-26 ENCOUNTER — Telehealth: Payer: Self-pay | Admitting: Family

## 2016-12-26 NOTE — Telephone Encounter (Signed)
Patient called requesting an appointment with Melissa. She states she is having pain in her left arm. She states it is not radiating to her chest but the pain is in her whole arm, not just one spot. Sent to [facility] to be evaluated by a nurse.

## 2016-12-27 NOTE — Telephone Encounter (Signed)
I do not see that pt connected to [facility]. Attempted to reach pt and mailbox was full. Unable to leave message.

## 2016-12-30 NOTE — Telephone Encounter (Signed)
Attempted to reach pt and left detailed message on voicemail to call if still needing appt and to give further information regarding her arm pain.

## 2017-01-02 MED FILL — AMLODIPINE BESYLATE 10 MG T: 10 | 30 days supply | Qty: 30 | Fill #3

## 2017-01-02 MED FILL — GABAPENTIN 300 MG CAPSULE: 300 | 30 days supply | Qty: 120 | Fill #1

## 2017-01-02 MED FILL — PANTOPRAZOLE SOD DR 40 MG T: 40 | 30 days supply | Qty: 30 | Fill #3

## 2017-01-02 MED FILL — FLUTICASONE PROP 50 MCG SPR: 50 | 30 days supply | Qty: 16 | Fill #2

## 2017-01-28 ENCOUNTER — Other Ambulatory Visit: Payer: Self-pay | Admitting: Physician Assistant

## 2017-01-28 MED FILL — AMLODIPINE BESYLATE 10 MG T: 10 | 30 days supply | Qty: 30 | Fill #0

## 2017-01-28 MED FILL — PANTOPRAZOLE SOD DR 40 MG T: 40 | 30 days supply | Qty: 30 | Fill #0

## 2017-02-04 ENCOUNTER — Encounter: Payer: Self-pay | Admitting: Family

## 2017-02-04 ENCOUNTER — Ambulatory Visit (INDEPENDENT_AMBULATORY_CARE_PROVIDER_SITE_OTHER): Payer: BLUE CROSS/BLUE SHIELD | Admitting: Family

## 2017-02-04 ENCOUNTER — Telehealth: Payer: Self-pay | Admitting: Family

## 2017-02-04 VITALS — BP 118/70 | HR 84 | Temp 98.3°F | Resp 16 | Ht 66.0 in | Wt 204.6 lb

## 2017-02-04 DIAGNOSIS — I498 Other specified cardiac arrhythmias: Secondary | ICD-10-CM

## 2017-02-04 DIAGNOSIS — I493 Ventricular premature depolarization: Secondary | ICD-10-CM

## 2017-02-04 DIAGNOSIS — M545 Low back pain, unspecified: Secondary | ICD-10-CM

## 2017-02-04 DIAGNOSIS — R0602 Shortness of breath: Secondary | ICD-10-CM | POA: Diagnosis not present

## 2017-02-04 DIAGNOSIS — R232 Flushing: Secondary | ICD-10-CM | POA: Diagnosis not present

## 2017-02-04 DIAGNOSIS — E785 Hyperlipidemia, unspecified: Secondary | ICD-10-CM

## 2017-02-04 DIAGNOSIS — I951 Orthostatic hypotension: Secondary | ICD-10-CM

## 2017-02-04 DIAGNOSIS — R739 Hyperglycemia, unspecified: Secondary | ICD-10-CM

## 2017-02-04 DIAGNOSIS — E876 Hypokalemia: Secondary | ICD-10-CM

## 2017-02-04 LAB — BASIC METABOLIC PANEL
BUN: 14 mg/dL (ref 6–23)
CO2: 26 mEq/L (ref 19–32)
Calcium: 9.3 mg/dL (ref 8.4–10.5)
Chloride: 105 mEq/L (ref 96–112)
Creatinine, Ser: 0.98 mg/dL (ref 0.40–1.20)
GFR: 73.84 mL/min (ref >60.00)
Glucose, Bld: 91 mg/dL (ref 70–99)
Potassium: 3.4 mEq/L — ABNORMAL LOW (ref 3.5–5.1)
Sodium: 138 mEq/L (ref 135–145)

## 2017-02-04 LAB — CBC WITH DIFFERENTIAL/PLATELET
Basophils Absolute: 0.1 10*3/uL (ref 0.0–0.1)
Basophils Relative: 0.8 % (ref 0.0–3.0)
Eosinophils Absolute: 0.1 10*3/uL (ref 0.0–0.7)
Eosinophils Relative: 1 % (ref 0.0–5.0)
HCT: 43.3 % (ref 36.0–46.0)
Hemoglobin: 14.3 g/dL (ref 12.0–15.0)
Lymphocytes Relative: 45.9 % (ref 12.0–46.0)
Lymphs Abs: 4.6 10*3/uL — ABNORMAL HIGH (ref 0.7–4.0)
MCHC: 33 g/dL (ref 30.0–36.0)
MCV: 82 fl (ref 78.0–100.0)
Monocytes Absolute: 0.8 10*3/uL (ref 0.1–1.0)
Monocytes Relative: 7.9 % (ref 3.0–12.0)
Neutro Abs: 4.5 10*3/uL (ref 1.4–7.7)
Neutrophils Relative %: 44.4 % (ref 43.0–77.0)
Platelets: 302 10*3/uL (ref 150.0–400.0)
RBC: 5.28 Mil/uL — ABNORMAL HIGH (ref 3.87–5.11)
RDW: 14.2 % (ref 11.5–15.5)
WBC: 10.1 10*3/uL (ref 4.0–10.5)

## 2017-02-04 MED ORDER — AMLODIPINE BESYLATE 10 MG PO TABS: ORAL_TABLET | ORAL | 3 refills | Status: DC

## 2017-02-04 NOTE — Progress Notes (Signed)
Pre visit review using our clinic review tool, if applicable. No additional management support is needed unless otherwise documented below in the visit note. 

## 2017-02-04 NOTE — Telephone Encounter (Signed)
lvm for pt to call back.  

## 2017-02-04 NOTE — Assessment & Plan Note (Signed)
Tolerating statin, obtain follow-up lipid panel. 

## 2017-02-04 NOTE — Progress Notes (Signed)
Subjective:    Patient ID: Jade [name], female    DOB: [birth year], 61 y.o.   MRN: [id number]  HPI  Jade [name] is a 61 yr old female who presents today for follow up.  Hypertension- she is currently maintained on amlodipine 10mg .  Notes that sometimes when she bends down she is "light headed."  She reports some left ear pain.  Has been hurting for several months.  She denies CP.  Notes that she has some SOB with exertion.  BP Readings from Last 3 Encounters:  02/04/17 118/70  11/06/16 125/82  10/09/16 (!) 150/78   Hot flashes- continues gabapentin which is helping significantly.  IBS- using linzess on a prn basis if she feels bloated. This helps her if she takes it.    GERD- stable on protonix. Reports well controlled. Symptoms return if she misses a does.    Hyperlipidemia- maintained on pravastatin. Denies myalgia.   Low back pain- She is following with Dr. Maxie Better. She is following with pain clinic who is providing her percocet. She is seeing Dr. Kyung Rudd for pain management. She is also maintained on meloxicam which she reports she only takes once every 3 days or so.    Borderlines DM2-  Lab Results  Component Value Date   HGBA1C 6.1 06/19/2016   HGBA1C 6.0 08/08/2015   HGBA1C 6.1 04/11/2015   Lab Results  Component Value Date   LDLCALC 172 (H) 02/13/2016   CREATININE 0.80 03/20/2016   .     Review of Systems See HPI  Past Medical History:  Diagnosis Date  . Benign microscopic hematuria    neg cystoscopy 11/12- dr. Janice Norrie  . DJD (degenerative joint disease)    L KNEE  . Family history of anesthesia complication    multiple family members with history of this  . GERD (gastroesophageal reflux disease)   . Hyperlipidemia   . Hypertension   . Malignant hyperthermia   . Numbness and tingling in left arm   . Shortness of breath    with  exertion     Social History   Social History  . Marital status: Married    Spouse name: N/A  . Number of children: 3    . Years of education: N/A   Occupational History  . Not on file.   Social History Main Topics  . Smoking status: Current Every Day Smoker    Packs/day: 0.50    Years: 39.00    Types: Cigarettes  . Smokeless tobacco: Never Used     Comment: 1 1/2 cigarettes daily  . Alcohol use No  . Drug use: No  . Sexual activity: Yes    Partners: Male   Other Topics Concern  . Not on file   Social History Narrative   Regular exercise:  No   Caffeine Use:  2 cups coffee and 3-4 glasses of soda daily.   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    Past Surgical History:  Procedure Laterality Date  . ABDOMINAL HYSTERECTOMY  1996  . CERVICAL DISC SURGERY  2013   Dr. Arnoldo Morale  . DILATION AND CURETTAGE OF UTERUS    . KNEE CARTILAGE SURGERY  1999   right knee  . ROTATOR CUFF REPAIR Right 05/25/13  . 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 TOTAL KNEE ARTHROPLASTY Right 01/19/2015   Procedure: RIGHT TOTAL KNEE ARTHROPLASTY;  Surgeon: Johnn Hai, MD;  Location: [facility];  Service: Orthopedics;  Laterality: Right;  . TUBAL LIGATION      Family History  Problem Relation Age of Onset  . Hypertension Mother   . Hyperlipidemia Mother   . Heart disease Father     CAD; stent at age 85  . Diabetes Father   . Hypertension Father   . Hyperlipidemia Father   . Cancer Father     prostate  . Cancer Paternal Aunt     BREAST  . Dementia Father     Allergies  Allergen Reactions  . Shrimp [Shellfish Allergy] Itching    Rash and lips itching  . Ace Inhibitors     Cough  . Aspirin Nausea And Vomiting  . Azithromycin Rash    Current Outpatient Prescriptions on File Prior to Visit  Medication Sig Dispense Refill  . amLODipine (NORVASC) 10 MG tablet TAKE 1 TABLET (10 MG TOTAL) BY MOUTH EVERY  MORNING. 30 tablet 3  . fluticasone (FLONASE) 50 MCG/ACT nasal spray PLACE 2 SPRAYS INTO BOTH NOSTRILS DAILY. 16 g 2  . gabapentin (NEURONTIN) 300 MG capsule TAKE 1 CAPSULE BY MOUTH DAILY IN THE MORNING, 1 AT NOON, AND 2 AT BEDTIME 120 capsule 5  . LINZESS 145 MCG CAPS capsule TAKE 1 CAPSULE (145 MCG TOTAL) BY MOUTH DAILY. 30 capsule 5  . meloxicam (MOBIC) 15 MG tablet Take 15 mg by mouth daily.  0  . methocarbamol (ROBAXIN) 500 MG tablet Take 500 mg by mouth 2 (two) times daily as needed.  2  . oxyCODONE-acetaminophen (PERCOCET) 7.5-325 MG tablet Take 1 tablet by mouth at bedtime as needed.  0  . pantoprazole (PROTONIX) 40 MG tablet TAKE 1 TABLET (40 MG TOTAL) BY MOUTH DAILY. 30 tablet 3  . potassium chloride SA (K-DUR,KLOR-CON) 20 MEQ tablet TAKE 1 TABLET (20 MEQ TOTAL) BY MOUTH DAILY. 30 tablet 5  . pravastatin (PRAVACHOL) 40 MG tablet Take 1 tablet (40 mg total) by mouth daily. 30 tablet 3   No current facility-administered medications on file prior to visit.     BP 118/70 (BP Location: Left Arm, Patient Position: Sitting, Cuff Size: Large)   Pulse 84   Temp 98.3 F (36.8 C) (Oral)   Resp 16   Ht 5\' 6"  (1.676 m)   Wt 204 lb 9.6 oz (92.8 kg)   LMP 11/25/1994   SpO2 96%   BMI 33.02 kg/m       Objective:   Physical Exam  Constitutional: She is oriented to person, place, and time. She appears well-developed and well-nourished.  Cardiovascular: Normal rate, regular rhythm and normal heart sounds.   No murmur heard. Pulmonary/Chest: Effort normal and breath sounds normal. No respiratory distress. She has no wheezes.  Musculoskeletal: She exhibits no edema.  Neurological: She is alert and oriented to person, place, and time.  Skin: Skin is warm and dry.  Psychiatric: She has a normal mood and affect. Her behavior is normal. Judgment and thought content normal.          Assessment & Plan:  Orthostasis- decrease amlodipine from 10mg  to 5mg .  PVC- reviewed EKG (notes  ventricular bigeminy). Reviewed with Dr. Irish Lack and he recommended repletion of electrolytes and repeat EKG. K+ is low will replete, see phone note.Due to c/o DOE, will obtain 2D echo.  However, I suspect that this is due to deconditioning.  Hot flashes- stable on effexor. Continue same.  IBS-stable with prn linzess.  Low back pain- followed by pain management.   Hyperlipidemia- maintained on statin, will return fasting for lipid panel.   Borderline DM2- obtain follow up lipid panel.

## 2017-02-04 NOTE — Assessment & Plan Note (Signed)
Being managed by

## 2017-02-04 NOTE — Telephone Encounter (Signed)
Spoke with pt about results pt voiced understanding. Scheduled lab and nurse visit for 02/11/2017

## 2017-02-04 NOTE — Telephone Encounter (Signed)
K+ is low.  Take an additional potassium pill today and tomorrow, then return to one pill daily. Repeat bmet in 1 week.  Dx hypokalemia. Lets book a nurse visit for a repeat EKG that day as well please, dx PVC's.

## 2017-02-04 NOTE — Assessment & Plan Note (Signed)
Obtain follow up A1C.   

## 2017-02-04 NOTE — Patient Instructions (Addendum)
Please complete lab work prior to leaving.  Decrease amlodipine from 10 mg to 5mg .   Schedule fasting lab visit for remaining labs.

## 2017-02-04 NOTE — Assessment & Plan Note (Signed)
>>  ASSESSMENT AND PLAN FOR DYSLIPIDEMIA WRITTEN ON 02/04/2017 11:20 AM BY O'SULLIVAN, Raziah Funnell, NP  Tolerating statin, obtain follow up lipid panel.

## 2017-02-11 ENCOUNTER — Other Ambulatory Visit: Payer: Self-pay | Admitting: Family

## 2017-02-11 ENCOUNTER — Telehealth: Payer: Self-pay | Admitting: Family

## 2017-02-11 ENCOUNTER — Other Ambulatory Visit (INDEPENDENT_AMBULATORY_CARE_PROVIDER_SITE_OTHER): Payer: BLUE CROSS/BLUE SHIELD

## 2017-02-11 ENCOUNTER — Ambulatory Visit (INDEPENDENT_AMBULATORY_CARE_PROVIDER_SITE_OTHER): Payer: BLUE CROSS/BLUE SHIELD | Admitting: Family

## 2017-02-11 DIAGNOSIS — R9431 Abnormal electrocardiogram [ECG] [EKG]: Secondary | ICD-10-CM

## 2017-02-11 DIAGNOSIS — I493 Ventricular premature depolarization: Secondary | ICD-10-CM | POA: Diagnosis not present

## 2017-02-11 DIAGNOSIS — E785 Hyperlipidemia, unspecified: Secondary | ICD-10-CM

## 2017-02-11 DIAGNOSIS — E876 Hypokalemia: Secondary | ICD-10-CM

## 2017-02-11 DIAGNOSIS — R739 Hyperglycemia, unspecified: Secondary | ICD-10-CM | POA: Diagnosis not present

## 2017-02-11 LAB — BASIC METABOLIC PANEL
BUN: 15 mg/dL (ref 6–23)
CO2: 24 mEq/L (ref 19–32)
Calcium: 9.3 mg/dL (ref 8.4–10.5)
Chloride: 106 mEq/L (ref 96–112)
Creatinine, Ser: 0.81 mg/dL (ref 0.40–1.20)
GFR: 91.99 mL/min (ref >60.00)
Glucose, Bld: 103 mg/dL — ABNORMAL HIGH (ref 70–99)
Potassium: 4 mEq/L (ref 3.5–5.1)
Sodium: 138 mEq/L (ref 135–145)

## 2017-02-11 LAB — LIPID PANEL
Cholesterol: 256 mg/dL — ABNORMAL HIGH (ref 0–200)
HDL: 43.9 mg/dL (ref >39.00)
NonHDL: 212.43
Total CHOL/HDL Ratio: 6
Triglycerides: 205 mg/dL — ABNORMAL HIGH (ref 0.0–149.0)
VLDL: 41 mg/dL — ABNORMAL HIGH (ref 0.0–40.0)

## 2017-02-11 LAB — LDL CHOLESTEROL, DIRECT: Direct LDL: 178 mg/dL

## 2017-02-11 LAB — HEMOGLOBIN A1C: Hgb A1c MFr Bld: 6.2 % (ref 4.6–6.5)

## 2017-02-11 NOTE — Telephone Encounter (Signed)
Sugar remains in the borderline diabetes range.  Cholesterol remains high. I would like to increase her pravastatin dose from 40mg  to 80 mg. Repeat lipids fasting in 6 weeks.

## 2017-02-11 NOTE — Telephone Encounter (Signed)
Patient came in today for follow up EKG.  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  Will refer to cardiology for further evaluation.

## 2017-02-11 NOTE — Progress Notes (Signed)
Pt here for repeat EKG from 02/04/17 due to PVCs. EKG repeated and reviewed by PCP. Pt should complete echo as scheduled on 02/20/17 and will arrange consult with cardiologist for further evaluation of PVCs. Referral pended.

## 2017-02-11 NOTE — Progress Notes (Signed)
Noted and agree. Referral signed elsewhere.

## 2017-02-12 MED ORDER — PRAVASTATIN SODIUM 80 MG PO TABS: 80.0000 mg | ORAL_TABLET | Freq: Every day | ORAL | 1 refills | Status: DC

## 2017-02-12 MED FILL — PRAVASTATIN SODIUM 80 MG TA: 80 | 90 days supply | Qty: 90 | Fill #0

## 2017-02-12 NOTE — Telephone Encounter (Signed)
Spoke with pt about results pravastatin change.Pt voiced understanding. Rx sent to pharmacy.

## 2017-02-20 ENCOUNTER — Other Ambulatory Visit: Payer: Self-pay

## 2017-02-20 ENCOUNTER — Ambulatory Visit (HOSPITAL_COMMUNITY): Payer: BLUE CROSS/BLUE SHIELD | Attending: Cardiovascular Disease

## 2017-02-20 DIAGNOSIS — I119 Hypertensive heart disease without heart failure: Secondary | ICD-10-CM | POA: Insufficient documentation

## 2017-02-20 DIAGNOSIS — R0602 Shortness of breath: Secondary | ICD-10-CM | POA: Diagnosis not present

## 2017-02-20 DIAGNOSIS — E785 Hyperlipidemia, unspecified: Secondary | ICD-10-CM | POA: Insufficient documentation

## 2017-02-20 DIAGNOSIS — Z720 Tobacco use: Secondary | ICD-10-CM | POA: Diagnosis not present

## 2017-02-20 DIAGNOSIS — I493 Ventricular premature depolarization: Secondary | ICD-10-CM | POA: Insufficient documentation

## 2017-02-20 DIAGNOSIS — R0600 Dyspnea, unspecified: Secondary | ICD-10-CM | POA: Diagnosis present

## 2017-02-20 DIAGNOSIS — Z8249 Family history of ischemic heart disease and other diseases of the circulatory system: Secondary | ICD-10-CM | POA: Insufficient documentation

## 2017-02-20 DIAGNOSIS — R42 Dizziness and giddiness: Secondary | ICD-10-CM | POA: Insufficient documentation

## 2017-02-20 DIAGNOSIS — R002 Palpitations: Secondary | ICD-10-CM | POA: Diagnosis not present

## 2017-02-27 MED FILL — PANTOPRAZOLE SOD DR 40 MG T: 40 | 30 days supply | Qty: 30 | Fill #1

## 2017-03-04 ENCOUNTER — Ambulatory Visit: Payer: BLUE CROSS/BLUE SHIELD | Admitting: Family

## 2017-03-27 ENCOUNTER — Encounter: Payer: Self-pay | Admitting: Family Medicine

## 2017-03-27 ENCOUNTER — Ambulatory Visit (INDEPENDENT_AMBULATORY_CARE_PROVIDER_SITE_OTHER): Payer: BLUE CROSS/BLUE SHIELD | Admitting: Family Medicine

## 2017-03-27 VITALS — BP 120/50 | HR 82 | Temp 98.0°F | Ht 66.0 in | Wt 201.8 lb

## 2017-03-27 DIAGNOSIS — J309 Allergic rhinitis, unspecified: Secondary | ICD-10-CM

## 2017-03-27 MED ORDER — ALBUTEROL SULFATE HFA 108 (90 BASE) MCG/ACT IN AERS: 2.0000 | INHALATION_SPRAY | Freq: Four times a day (QID) | RESPIRATORY_TRACT | 0 refills | Status: DC | PRN

## 2017-03-27 MED ORDER — LEVOCETIRIZINE DIHYDROCHLORIDE 5 MG PO TABS: 5.0000 mg | ORAL_TABLET | Freq: Every evening | ORAL | 2 refills | Status: DC

## 2017-03-27 MED FILL — LEVOCETIRIZINE 5 MG TABLET: 5 | 30 days supply | Qty: 30 | Fill #0

## 2017-03-27 MED FILL — VENTOLIN HFA 90 MCG INHALER: 108 (90 BAS | 25 days supply | Qty: 18 | Fill #0

## 2017-03-27 NOTE — Progress Notes (Signed)
Pre visit review using our clinic review tool, if applicable. No additional management support is needed unless otherwise documented below in the visit note. 

## 2017-03-27 NOTE — Progress Notes (Signed)
Chief Complaint  Patient presents with  . Cough    dry-started on Mon,along with runny nose and eyes    Adriana Simas here for URI complaints.  Duration: 3 days  Associated symptoms: subjective fever, sinus congestion, rhinorrhea, itchy watery eyes, SOB (is a smoker) and cough Denies: ear pain, ear drainage, sore throat, wheezing and rigors Treatment to date: INCS, Zyrtec, Mucinex-DM Feels better today Sick contacts: No  ROS:  HEENT: As noted in HPI Lungs: +SOB  Past Medical History:  Diagnosis Date  . Benign microscopic hematuria    neg cystoscopy 11/12- dr. Janice Norrie  . DJD (degenerative joint disease)    L KNEE  . Family history of anesthesia complication    multiple family members with history of this  . GERD (gastroesophageal reflux disease)   . Hyperlipidemia   . Hypertension   . Malignant hyperthermia   . Numbness and tingling in left arm    Family History  Problem Relation Age of Onset  . Hypertension Mother   . Hyperlipidemia Mother   . Heart disease Father     CAD; stent at age 57  . Diabetes Father   . Hypertension Father   . Hyperlipidemia Father   . Cancer Father     prostate  . Cancer Paternal Aunt     BREAST  . Dementia Father     BP (!) 120/50 (BP Location: Left Arm, Patient Position: Sitting, Cuff Size: Large)   Pulse 82   Temp 98 F (36.7 C) (Oral)   Ht 5\' 6"  (1.676 m)   Wt 201 lb 12.8 oz (91.5 kg)   LMP 11/25/1994   SpO2 98%   BMI 32.57 kg/m  General: Awake, alert, appears stated age HEENT: AT, [place], ears patent b/l and TM's neg, nares patent w/o discharge, pharynx pink and without exudates, MMM Neck: No masses or asymmetry Heart: Decreased breath sounds, RRR, no murmurs Lungs: CTAB, no accessory muscle use, no wheezing Psych: Age appropriate judgment and insight, normal mood and affect  Allergic rhinitis, unspecified seasonality, unspecified trigger - Plan: levocetirizine (XYZAL) 5 MG tablet, albuterol (PROVENTIL HFA;VENTOLIN HFA) 108  (90 Base) MCG/ACT inhaler  Orders as above. Continue to push fluids, practice good hand hygiene, cover mouth when coughing. F/u prn. If starting to experience fevers, shaking, or shortness of breath, seek immediate care. Pt voiced understanding and agreement to the plan.  Roberts, DO 03/27/17 2:00 PM

## 2017-03-27 NOTE — Patient Instructions (Addendum)
Continue to push fluids, practice good hand hygiene, and cover your mouth if you cough.  If you start having fevers, shaking or shortness of breath, seek immediate care.  Xyzal (levocetirizine) 5 mg daily is available over the counter. Compare it to the prescription price and get whichever is cheaper. Don't take with Zyrtec.   Stop smoking.

## 2017-04-01 MED FILL — PANTOPRAZOLE SOD DR 40 MG T: 40 | 30 days supply | Qty: 30 | Fill #2

## 2017-04-11 MED FILL — AMLODIPINE BESYLATE 10 MG T: 10 | 30 days supply | Qty: 30 | Fill #1

## 2017-04-28 ENCOUNTER — Emergency Department (HOSPITAL_BASED_OUTPATIENT_CLINIC_OR_DEPARTMENT_OTHER): Payer: BLUE CROSS/BLUE SHIELD

## 2017-04-28 ENCOUNTER — Encounter (HOSPITAL_BASED_OUTPATIENT_CLINIC_OR_DEPARTMENT_OTHER): Payer: Self-pay | Admitting: *Deleted

## 2017-04-28 ENCOUNTER — Emergency Department (HOSPITAL_BASED_OUTPATIENT_CLINIC_OR_DEPARTMENT_OTHER)
Admission: EM | Admit: 2017-04-28 | Discharge: 2017-04-28 | Disposition: A | Discharge status: Home / Self Care | Payer: BLUE CROSS/BLUE SHIELD | Attending: Emergency Medicine | Admitting: Emergency Medicine

## 2017-04-28 DIAGNOSIS — Y92410 Unspecified street and highway as the place of occurrence of the external cause: Secondary | ICD-10-CM | POA: Insufficient documentation

## 2017-04-28 DIAGNOSIS — Y939 Activity, unspecified: Secondary | ICD-10-CM | POA: Insufficient documentation

## 2017-04-28 DIAGNOSIS — I10 Essential (primary) hypertension: Secondary | ICD-10-CM | POA: Insufficient documentation

## 2017-04-28 DIAGNOSIS — M542 Cervicalgia: Secondary | ICD-10-CM

## 2017-04-28 DIAGNOSIS — M545 Low back pain, unspecified: Secondary | ICD-10-CM

## 2017-04-28 DIAGNOSIS — F17210 Nicotine dependence, cigarettes, uncomplicated: Secondary | ICD-10-CM | POA: Diagnosis not present

## 2017-04-28 DIAGNOSIS — Y999 Unspecified external cause status: Secondary | ICD-10-CM | POA: Insufficient documentation

## 2017-04-28 DIAGNOSIS — S3992XA Unspecified injury of lower back, initial encounter: Secondary | ICD-10-CM | POA: Diagnosis present
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
## 2017-04-28 NOTE — Discharge Instructions (Signed)
Take ibuprofen as directed for inflammation and your pain medications as prescribed for breakthrough pain and methocarbomol  for muscle relaxation but do not drive, drink alcohol, or operate machinery. Ice to areas of soreness for the next few days and then may move to heat. Expect to be sore for the next few day and follow up with primary care physician for recheck of ongoing symptoms but return to ER for emergent changing or worsening of symptoms. Follow-up with your pain management physician and orthopedics doctor for reevaluation of your symptoms.

## 2017-04-28 NOTE — ED Notes (Signed)
ED Provider at bedside. 
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
## 2017-05-21 ENCOUNTER — Ambulatory Visit (INDEPENDENT_AMBULATORY_CARE_PROVIDER_SITE_OTHER): Payer: BLUE CROSS/BLUE SHIELD | Admitting: Family

## 2017-05-21 ENCOUNTER — Encounter: Payer: Self-pay | Admitting: Family

## 2017-05-21 VITALS — BP 136/68 | HR 74 | Temp 98.4°F | Resp 16 | Ht 66.0 in | Wt 205.8 lb

## 2017-05-21 DIAGNOSIS — N6452 Nipple discharge: Secondary | ICD-10-CM

## 2017-05-21 DIAGNOSIS — H9203 Otalgia, bilateral: Secondary | ICD-10-CM

## 2017-05-21 MED ORDER — CEPHALEXIN 500 MG PO CAPS: 500.0000 mg | ORAL_CAPSULE | Freq: Three times a day (TID) | ORAL | 0 refills | Status: DC

## 2017-05-21 MED FILL — CEPHALEXIN 500 MG CAPSULE: 500 | 7 days supply | Qty: 21 | Fill #0

## 2017-05-21 NOTE — Progress Notes (Signed)
Subjective:    Patient ID: Jade [name], female    DOB: [birth year], 61 y.o.   MRN: [id number]  HPI  Pt reports bilateral ear pain x 2 weeks.  L>R. She is using flonase and xyzal without improvement. Occasional pain behind her eye and headache.     She reports drainage from left breast, nearly every day.  Has previous hx of chronic left Mastitis and had breast surgery back in 2017 for this.   Review of Systems Past Medical History:  Diagnosis Date  . Benign microscopic hematuria    neg cystoscopy 11/12- dr. Janice Norrie  . DJD (degenerative joint disease)    L KNEE  . Family history of anesthesia complication    multiple family members with history of this  . GERD (gastroesophageal reflux disease)   . Hyperlipidemia   . Hypertension   . Malignant hyperthermia   . Numbness and tingling in left arm      Social History   Social History  . Marital status: Married    Spouse name: N/A  . Number of children: 3  . Years of education: N/A   Occupational History  . Not on file.   Social History Main Topics  . Smoking status: Current Every Day Smoker    Packs/day: 0.50    Years: 39.00    Types: Cigarettes  . Smokeless tobacco: Never Used     Comment: 1 1/2 cigarettes daily  . Alcohol use No  . Drug use: No  . Sexual activity: Yes    Partners: Male   Other Topics Concern  . Not on file   Social History Narrative   Regular exercise:  No   Caffeine Use:  2 cups coffee and 3-4 glasses of soda daily.   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    Past Surgical History:  Procedure Laterality Date  . ABDOMINAL HYSTERECTOMY  1996  . CERVICAL DISC SURGERY  2013   Dr. Arnoldo Morale  . DILATION AND CURETTAGE OF UTERUS    . KNEE CARTILAGE SURGERY  1999   right knee  . ROTATOR CUFF REPAIR Right 05/25/13  . TOTAL KNEE ARTHROPLASTY Left 01/20/2014   Procedure: LEFT TOTAL KNEE ARTHROPLASTY;  Surgeon: Johnn Hai, MD;   Location: [facility];  Service: Orthopedics;  Laterality: Left;  . TOTAL KNEE ARTHROPLASTY Right 01/19/2015   Procedure: RIGHT TOTAL KNEE ARTHROPLASTY;  Surgeon: Johnn Hai, MD;  Location: [facility];  Service: Orthopedics;  Laterality: Right;  . TUBAL LIGATION      Family History  Problem Relation Age of Onset  . Hypertension Mother   . Hyperlipidemia Mother   . Heart disease Father        CAD; stent at age 71  . Diabetes Father   . Hypertension Father   . Hyperlipidemia Father   . Cancer Father        prostate  . Dementia Father   . Cancer Paternal Aunt        BREAST    Allergies  Allergen Reactions  . Shrimp [Shellfish Allergy] Itching    Rash and lips itching  . Ace Inhibitors     Cough  . Aspirin Nausea And Vomiting  . Azithromycin Rash    Current Outpatient Prescriptions on File Prior to Visit  Medication Sig Dispense Refill  . albuterol (PROVENTIL HFA;VENTOLIN HFA) 108 (90 Base) MCG/ACT inhaler Inhale 2 puffs into the lungs  every 6 (six) hours as needed for wheezing or shortness of breath. 1 Inhaler 0  . amLODipine (NORVASC) 10 MG tablet TAKE 1/2 tablet by mouth once daily 30 tablet 3  . fluticasone (FLONASE) 50 MCG/ACT nasal spray PLACE 2 SPRAYS INTO BOTH NOSTRILS DAILY. 16 g 2  . gabapentin (NEURONTIN) 300 MG capsule TAKE 1 CAPSULE BY MOUTH DAILY IN THE MORNING, 1 AT NOON, AND 2 AT BEDTIME 120 capsule 5  . ibuprofen (ADVIL,MOTRIN) 600 MG tablet Take 1 tablet twice a day as needed with food.  0  . levocetirizine (XYZAL) 5 MG tablet Take 1 tablet (5 mg total) by mouth every evening. 30 tablet 2  . LINZESS 145 MCG CAPS capsule TAKE 1 CAPSULE (145 MCG TOTAL) BY MOUTH DAILY. 30 capsule 5  . meloxicam (MOBIC) 15 MG tablet Take 15 mg by mouth daily.  0  . methocarbamol (ROBAXIN) 500 MG tablet Take 500 mg by mouth 2 (two) times daily as needed.  2  . oxyCODONE-acetaminophen (PERCOCET) 7.5-325 MG tablet Take 1 tablet by mouth at bedtime as needed.  0  . pantoprazole (PROTONIX)  40 MG tablet TAKE 1 TABLET (40 MG TOTAL) BY MOUTH DAILY. 30 tablet 3  . potassium chloride SA (K-DUR,KLOR-CON) 20 MEQ tablet TAKE 1 TABLET (20 MEQ TOTAL) BY MOUTH DAILY. 30 tablet 5  . pravastatin (PRAVACHOL) 80 MG tablet Take 1 tablet (80 mg total) by mouth daily. 90 tablet 1   No current facility-administered medications on file prior to visit.     BP 136/68 (BP Location: Left Arm, Cuff Size: Large)   Pulse 74   Temp 98.4 F (36.9 C) (Oral)   Resp 16   Ht 5\' 6"  (1.676 m)   Wt 205 lb 12.8 oz (93.4 kg)   LMP 11/25/1994   SpO2 100%   BMI 33.22 kg/m       Objective:   Physical Exam  Constitutional: She appears well-developed and well-nourished.  HENT:  Head: Normocephalic and atraumatic.  Right Ear: Tympanic membrane and ear canal normal.  Left Ear: Tympanic membrane and ear canal normal.  Mouth/Throat: No oropharyngeal exudate, posterior oropharyngeal edema or posterior oropharyngeal erythema.  Cardiovascular: Normal rate, regular rhythm and normal heart sounds.   No murmur heard. Pulmonary/Chest: Effort normal and breath sounds normal. No respiratory distress. She has no wheezes.  Psychiatric: She has a normal mood and affect. Her behavior is normal. Judgment and thought content normal.  Breast- + yellow discharge expressed from left nipple, no obvious masses but has some thickening from previous surgery.         Assessment & Plan:  Breast drainage- rx with keflex, obtain diagnostic mammo, advised pt to schedule follow up with her surgeon.  Otalgia- ? Eustachian tub dysfunction.  Mild sinusitis is a possibility. Will see how she feels in 1 week after abx for above. If no improvement, consider ENT referral next visit. Continue zyxal and flonase.

## 2017-05-21 NOTE — Patient Instructions (Addendum)
Please call Dr Arredondo's office to schedule your mammogram and yearly follow up. [phone]. Start Keflex, antibiotic. You willl be contacted about your breast imaging at the Breast center.

## 2017-05-27 ENCOUNTER — Encounter: Payer: Self-pay | Admitting: Family

## 2017-05-27 ENCOUNTER — Ambulatory Visit (INDEPENDENT_AMBULATORY_CARE_PROVIDER_SITE_OTHER): Payer: BLUE CROSS/BLUE SHIELD | Admitting: Family

## 2017-05-27 VITALS — BP 126/51 | HR 78 | Temp 98.4°F | Resp 18 | Ht 66.0 in | Wt 205.0 lb

## 2017-05-27 DIAGNOSIS — M549 Dorsalgia, unspecified: Secondary | ICD-10-CM

## 2017-05-27 DIAGNOSIS — M542 Cervicalgia: Secondary | ICD-10-CM

## 2017-05-27 DIAGNOSIS — N611 Abscess of the breast and nipple: Secondary | ICD-10-CM

## 2017-05-27 DIAGNOSIS — H6692 Otitis media, unspecified, left ear: Secondary | ICD-10-CM | POA: Diagnosis not present

## 2017-05-27 MED ORDER — AMOXICILLIN-POT CLAVULANATE 875-125 MG PO TABS: 1.0000 | ORAL_TABLET | Freq: Two times a day (BID) | ORAL | 0 refills | Status: DC
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
## 2017-05-27 NOTE — Patient Instructions (Addendum)
Please begin Augmentin for your left ear infection. Please let us know if pain is not improved in 1 week. Your breast imaging has been scheduled for this Friday at the breast center. Please arrive by 2:40pm. Let us know if you have increased pain redness or drainage from the left breast. Also let us know if you develop fever. We will work on getting you in to see Dr. Tonita Cong and Dr.Arredondo.

## 2017-05-30 ENCOUNTER — Ambulatory Visit
Admission: RE | Admit: 2017-05-30 | Discharge: 2017-05-30 | Disposition: A | Discharge status: Home / Self Care | Payer: BLUE CROSS/BLUE SHIELD | Source: Ambulatory Visit | Attending: Family | Admitting: Family

## 2017-05-30 ENCOUNTER — Ambulatory Visit: Payer: BLUE CROSS/BLUE SHIELD

## 2017-05-30 DIAGNOSIS — N6452 Nipple discharge: Secondary | ICD-10-CM

## 2017-07-02 ENCOUNTER — Ambulatory Visit (INDEPENDENT_AMBULATORY_CARE_PROVIDER_SITE_OTHER): Payer: BLUE CROSS/BLUE SHIELD | Admitting: Family

## 2017-07-02 VITALS — BP 103/58 | HR 81 | Temp 98.0°F | Ht 66.0 in | Wt 206.2 lb

## 2017-07-02 DIAGNOSIS — I10 Essential (primary) hypertension: Secondary | ICD-10-CM

## 2017-07-02 DIAGNOSIS — Z720 Tobacco use: Secondary | ICD-10-CM | POA: Diagnosis not present

## 2017-07-02 DIAGNOSIS — K219 Gastro-esophageal reflux disease without esophagitis: Secondary | ICD-10-CM

## 2017-07-02 DIAGNOSIS — J309 Allergic rhinitis, unspecified: Secondary | ICD-10-CM

## 2017-07-02 DIAGNOSIS — K589 Irritable bowel syndrome without diarrhea: Secondary | ICD-10-CM

## 2017-07-02 LAB — BASIC METABOLIC PANEL
BUN: 11 mg/dL (ref 6–23)
CO2: 28 mEq/L (ref 19–32)
Calcium: 8.7 mg/dL (ref 8.4–10.5)
Chloride: 106 mEq/L (ref 96–112)
Creatinine, Ser: 0.82 mg/dL (ref 0.40–1.20)
GFR: 90.58 mL/min (ref >60.00)
Glucose, Bld: 103 mg/dL — ABNORMAL HIGH (ref 70–99)
Potassium: 2.8 mEq/L — CL (ref 3.5–5.1)
Sodium: 141 mEq/L (ref 135–145)

## 2017-07-02 NOTE — Assessment & Plan Note (Signed)
Appears overtreated. Will give trial off of amlodipine. Hopefully this will also help with her lower extremity edema. Will have her follow-up in 2 weeks with when necessary for blood pressure recheck.

## 2017-07-02 NOTE — Progress Notes (Signed)
Pre visit review using our clinic tool,if applicable. No additional management support is needed unless otherwise documented below in the visit note.  

## 2017-07-02 NOTE — Progress Notes (Signed)
Subjective:    Patient ID: Jade [name], female    DOB: November 15, 1956, 61 y.o.   MRN: [id number]  HPI  Patient presents today for follow-up. Last visit we were evaluating her breast drainage. She had been treated with Keflex. Diagnostic mammogram and ultrasound were performed. Results were consistent with postsurgical changes in the left breast.  She also saw her breast surgeon in follow-up for this and no further workup was pursued.Patient reports resolution of drainage following antibiotics.  HTN she is maintained on amlodipine 5 mg once daily. She reports LE edema.   BP Readings from Last 3 Encounters:  07/02/17 (!) 103/58  05/27/17 (!) 126/51  05/21/17 136/68   GERD-  she is maintained on Protonix 40 mg once daily. Reports that symptoms well controlled. Has recurrent sxs if she stops the rx.    Irritable bowel syndrome-continues Linzess 145 g capsule once daily. Reports that this helps her chronic constipation.   Tobacco abuse- trying to quit.  1/2 PPD.  Used to smoke 1 PPD.  Allergic rhinitis- using zyrtec prn, flonase.  Ran out of xyzal. Mild frontal HA.  Denies congestion.   Review of Systems See HPI  Past Medical History:  Diagnosis Date  . Benign microscopic hematuria    neg cystoscopy 11/12- dr. Janice Norrie  . DJD (degenerative joint disease)    L KNEE  . Family history of anesthesia complication    multiple family members with history of this  . GERD (gastroesophageal reflux disease)   . Hyperlipidemia   . Hypertension   . Malignant hyperthermia   . Numbness and tingling in left arm      Social History   Social History  . Marital status: Married    Spouse name: N/A  . Number of children: 3  . Years of education: N/A   Occupational History  . Not on file.   Social History Main Topics  . Smoking status: Current Every Day Smoker    Packs/day: 0.50    Years: 39.00    Types: Cigarettes  . Smokeless tobacco: Never Used     Comment: 1 1/2 cigarettes daily  .  Alcohol use No  . Drug use: No  . Sexual activity: Yes    Partners: Male   Other Topics Concern  . Not on file   Social History Narrative   Regular exercise:  No   Caffeine Use:  2 cups coffee and 3-4 glasses of soda daily.   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    Past Surgical History:  Procedure Laterality Date  . ABDOMINAL HYSTERECTOMY  1996  . BREAST EXCISIONAL BIOPSY Left 01/2016  . BREAST EXCISIONAL BIOPSY Left 03/2016  . CERVICAL DISC SURGERY  2013   Dr. Arnoldo Morale  . DILATION AND CURETTAGE OF UTERUS    . KNEE CARTILAGE SURGERY  1999   right knee  . ROTATOR CUFF REPAIR Right 05/25/13  . 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 TOTAL KNEE ARTHROPLASTY Right 01/19/2015   Procedure: RIGHT TOTAL KNEE ARTHROPLASTY;  Surgeon: Johnn Hai, MD;  Location: [facility];  Service: Orthopedics;  Laterality: Right;  . TUBAL LIGATION      Family History  Problem Relation Age of Onset  . Hypertension Mother   . Hyperlipidemia Mother   .  Heart disease Father        CAD; stent at age 97  . Diabetes Father   . Hypertension Father   . Hyperlipidemia Father   . Cancer Father        prostate  . Dementia Father   . Cancer Paternal Aunt        BREAST  . Breast cancer Paternal Aunt     Allergies  Allergen Reactions  . Shrimp [Shellfish Allergy] Itching    Rash and lips itching  . Ace Inhibitors     Cough  . Aspirin Nausea And Vomiting  . Azithromycin Rash    Current Outpatient Prescriptions on File Prior to Visit  Medication Sig Dispense Refill  . albuterol (PROVENTIL HFA;VENTOLIN HFA) 108 (90 Base) MCG/ACT inhaler Inhale 2 puffs into the lungs every 6 (six) hours as needed for wheezing or shortness of breath. 1 Inhaler 0  . amLODipine (NORVASC) 10 MG tablet TAKE 1/2 tablet by  mouth once daily 30 tablet 3  . fluticasone (FLONASE) 50 MCG/ACT nasal spray PLACE 2 SPRAYS INTO BOTH NOSTRILS DAILY. 16 g 2  . gabapentin (NEURONTIN) 300 MG capsule TAKE 1 CAPSULE BY MOUTH DAILY IN THE MORNING, 1 AT NOON, AND 2 AT BEDTIME 120 capsule 5  . ibuprofen (ADVIL,MOTRIN) 600 MG tablet Take 1 tablet twice a day as needed with food.  0  . levocetirizine (XYZAL) 5 MG tablet Take 1 tablet (5 mg total) by mouth every evening. 30 tablet 2  . LINZESS 145 MCG CAPS capsule TAKE 1 CAPSULE (145 MCG TOTAL) BY MOUTH DAILY. 30 capsule 5  . methocarbamol (ROBAXIN) 500 MG tablet Take 500 mg by mouth 2 (two) times daily as needed.  2  . oxyCODONE-acetaminophen (PERCOCET) 7.5-325 MG tablet Take 1 tablet by mouth at bedtime as needed.  0  . pantoprazole (PROTONIX) 40 MG tablet TAKE 1 TABLET (40 MG TOTAL) BY MOUTH DAILY. 30 tablet 3  . potassium chloride SA (K-DUR,KLOR-CON) 20 MEQ tablet TAKE 1 TABLET (20 MEQ TOTAL) BY MOUTH DAILY. 30 tablet 5  . pravastatin (PRAVACHOL) 80 MG tablet Take 1 tablet (80 mg total) by mouth daily. 90 tablet 1   No current facility-administered medications on file prior to visit.     BP (!) 103/58   Pulse 81   Temp 98 F (36.7 C) (Oral)   Ht 5\' 6"  (1.676 m)   Wt 206 lb 3.2 oz (93.5 kg)   LMP 11/25/1994   SpO2 100%   BMI 33.28 kg/m       Objective:   Physical Exam  Constitutional: She is oriented to person, place, and time. She appears well-developed and well-nourished.  Cardiovascular: Normal rate, regular rhythm and normal heart sounds.   No murmur heard. Pulmonary/Chest: Effort normal and breath sounds normal. No respiratory distress. She has no wheezes.  Musculoskeletal:  2+ bilateral LE edema  Neurological: She is alert and oriented to person, place, and time.  Psychiatric: She has a normal mood and affect. Her behavior is normal. Judgment and thought content normal.          Assessment & Plan:

## 2017-07-02 NOTE — Assessment & Plan Note (Signed)
Stable, continue antihistamine and nasal steroid.

## 2017-07-02 NOTE — Assessment & Plan Note (Signed)
She is interested in quitting. She would like to try the nicotine patch. I advised her to start with the 14 g patch daily.

## 2017-07-02 NOTE — Assessment & Plan Note (Signed)
Stable on Protonix continue same. 

## 2017-07-02 NOTE — Patient Instructions (Addendum)
Stop amlodipine. Please complete lab work prior to leaving. Good luck quitting smoking.

## 2017-07-02 NOTE — Assessment & Plan Note (Signed)
Stable on Linzess continue same.

## 2017-07-03 ENCOUNTER — Telehealth: Payer: Self-pay

## 2017-07-03 DIAGNOSIS — E876 Hypokalemia: Secondary | ICD-10-CM

## 2017-07-03 NOTE — Telephone Encounter (Signed)
Lab appt scheduled.  Future lab ordered.

## 2017-07-07 ENCOUNTER — Other Ambulatory Visit (INDEPENDENT_AMBULATORY_CARE_PROVIDER_SITE_OTHER): Payer: BLUE CROSS/BLUE SHIELD

## 2017-07-07 DIAGNOSIS — E876 Hypokalemia: Secondary | ICD-10-CM | POA: Diagnosis not present

## 2017-07-07 LAB — BASIC METABOLIC PANEL
BUN: 11 mg/dL (ref 6–23)
CO2: 24 mEq/L (ref 19–32)
Calcium: 9 mg/dL (ref 8.4–10.5)
Chloride: 106 mEq/L (ref 96–112)
Creatinine, Ser: 0.69 mg/dL (ref 0.40–1.20)
GFR: 110.54 mL/min (ref >60.00)
Glucose, Bld: 121 mg/dL — ABNORMAL HIGH (ref 70–99)
Potassium: 3.4 mEq/L — ABNORMAL LOW (ref 3.5–5.1)
Sodium: 139 mEq/L (ref 135–145)

## 2017-07-10 ENCOUNTER — Telehealth: Payer: Self-pay | Admitting: Family

## 2017-07-10 DIAGNOSIS — E876 Hypokalemia: Secondary | ICD-10-CM

## 2017-07-10 MED ORDER — POTASSIUM CHLORIDE CRYS ER 20 MEQ PO TBCR: 20.0000 meq | EXTENDED_RELEASE_TABLET | Freq: Two times a day (BID) | ORAL | 5 refills | Status: DC

## 2017-07-10 NOTE — Telephone Encounter (Signed)
Please let patient know that her follow-up potassium is improved. However it is still little bit low. I would recommend that she increase her K Dur tablet to twice daily and plan to repeat basic metabolic panel in 1 week please

## 2017-07-11 NOTE — Telephone Encounter (Signed)
Notified pt and scheduled lab appt for 07/18/17 at 10:30am. Future order entered.

## 2017-07-16 ENCOUNTER — Ambulatory Visit (INDEPENDENT_AMBULATORY_CARE_PROVIDER_SITE_OTHER): Payer: BLUE CROSS/BLUE SHIELD | Admitting: Family Medicine

## 2017-07-16 VITALS — BP 146/75 | HR 75

## 2017-07-16 DIAGNOSIS — I10 Essential (primary) hypertension: Secondary | ICD-10-CM | POA: Diagnosis not present

## 2017-07-16 MED ORDER — AMLODIPINE BESYLATE 2.5 MG PO TABS: 2.5000 mg | ORAL_TABLET | Freq: Every day | ORAL | 0 refills | Status: DC

## 2017-07-16 NOTE — Progress Notes (Signed)
Pre visit review using our clinic review tool, if applicable. No additional management support is needed unless otherwise documented below in the visit note.  Patient presents in office for blood pressure check per OV note 07/02/17. Currently, the patient is not taking medication for blood pressure. Patient is asymptomatic. The following readings were obtained during today's visit: BP 155/84 P 73 & BP 146/75 P 75.  Per Dr. Lorelei Pont: START Amlodipine (Norvasc) 2.5 MG once daily. Follow-up with PCP in 1 month.  Informed patient of the provider's recommendations. She verbalized understanding. Next appointment 08/18/17 at 10:45 AM.

## 2017-07-16 NOTE — Patient Instructions (Signed)
Per Dr. Lorelei Pont: START Amlodipine (Norvasc) 2.5 MG once daily. Follow-up with PCP in 1 month.

## 2017-07-18 ENCOUNTER — Other Ambulatory Visit (INDEPENDENT_AMBULATORY_CARE_PROVIDER_SITE_OTHER): Payer: BLUE CROSS/BLUE SHIELD

## 2017-07-18 DIAGNOSIS — E876 Hypokalemia: Secondary | ICD-10-CM | POA: Diagnosis not present

## 2017-07-18 LAB — BASIC METABOLIC PANEL
BUN: 14 mg/dL (ref 6–23)
CO2: 24 mEq/L (ref 19–32)
Calcium: 9.4 mg/dL (ref 8.4–10.5)
Chloride: 106 mEq/L (ref 96–112)
Creatinine, Ser: 0.96 mg/dL (ref 0.40–1.20)
GFR: 75.5 mL/min (ref >60.00)
Glucose, Bld: 122 mg/dL — ABNORMAL HIGH (ref 70–99)
Potassium: 3.5 mEq/L (ref 3.5–5.1)
Sodium: 139 mEq/L (ref 135–145)

## 2017-08-18 ENCOUNTER — Ambulatory Visit (INDEPENDENT_AMBULATORY_CARE_PROVIDER_SITE_OTHER): Payer: BLUE CROSS/BLUE SHIELD | Admitting: Family

## 2017-08-18 ENCOUNTER — Encounter: Payer: Self-pay | Admitting: Family

## 2017-08-18 ENCOUNTER — Telehealth: Payer: Self-pay | Admitting: Family

## 2017-08-18 VITALS — BP 134/61 | HR 78 | Temp 98.2°F | Resp 18 | Ht 66.0 in | Wt 210.2 lb

## 2017-08-18 DIAGNOSIS — Z23 Encounter for immunization: Secondary | ICD-10-CM

## 2017-08-18 DIAGNOSIS — E876 Hypokalemia: Secondary | ICD-10-CM | POA: Diagnosis not present

## 2017-08-18 LAB — BASIC METABOLIC PANEL
BUN: 13 mg/dL (ref 6–23)
CO2: 26 mEq/L (ref 19–32)
Calcium: 9.4 mg/dL (ref 8.4–10.5)
Chloride: 108 mEq/L (ref 96–112)
Creatinine, Ser: 0.76 mg/dL (ref 0.40–1.20)
GFR: 98.84 mL/min (ref >60.00)
Glucose, Bld: 101 mg/dL — ABNORMAL HIGH (ref 70–99)
Potassium: 3.2 mEq/L — ABNORMAL LOW (ref 3.5–5.1)
Sodium: 143 mEq/L (ref 135–145)

## 2017-08-18 MED ORDER — AMLODIPINE BESYLATE 2.5 MG PO TABS: 2.5000 mg | ORAL_TABLET | Freq: Every day | ORAL | 1 refills | Status: DC

## 2017-08-18 NOTE — Progress Notes (Signed)
Subjective:    Patient ID: Jade [name], female    DOB: 1956-07-09, 61 y.o.   MRN: [id number]  HPI  Jade [name] Is a 61 year old female who presents today for follow-up.  HTN- last visit blood pressure was mildly elevated. We added amlodipine 2.5 mg once daily. Patient continued her 10 mg tab half tab every other day instead. BP Readings from Last 3 Encounters:  08/18/17 134/61  07/16/17 (!) 146/75  07/02/17 (!) 103/58   Hypokalemia-   Reports last colo was ? 2009 or 2010. This was at [facility].    Review of Systems See HPI  Past Medical History:  Diagnosis Date  . Benign microscopic hematuria    neg cystoscopy 11/12- dr. Janice Norrie  . DJD (degenerative joint disease)    L KNEE  . Family history of anesthesia complication    multiple family members with history of this  . GERD (gastroesophageal reflux disease)   . Hyperlipidemia   . Hypertension   . Malignant hyperthermia   . Numbness and tingling in left arm      Social History   Social History  . Marital status: Married    Spouse name: N/A  . Number of children: 3  . Years of education: N/A   Occupational History  . Not on file.   Social History Main Topics  . Smoking status: Current Every Day Smoker    Packs/day: 0.50    Years: 39.00    Types: Cigarettes  . Smokeless tobacco: Never Used     Comment: 1 1/2 cigarettes daily  . Alcohol use No  . Drug use: No  . Sexual activity: Yes    Partners: Male   Other Topics Concern  . Not on file   Social History Narrative   Regular exercise:  No   Caffeine Use:  2 cups coffee and 3-4 glasses of soda daily.   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    Past Surgical History:  Procedure Laterality Date  . ABDOMINAL HYSTERECTOMY  1996  . BREAST EXCISIONAL BIOPSY Left 01/2016  . BREAST EXCISIONAL BIOPSY Left 03/2016  . CERVICAL DISC SURGERY  2013   Dr. Arnoldo Morale  . DILATION AND CURETTAGE OF  UTERUS    . KNEE CARTILAGE SURGERY  1999   right knee  . ROTATOR CUFF REPAIR Right 05/25/13  . 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 TOTAL KNEE ARTHROPLASTY Right 01/19/2015   Procedure: RIGHT TOTAL KNEE ARTHROPLASTY;  Surgeon: Johnn Hai, MD;  Location: [facility];  Service: Orthopedics;  Laterality: Right;  . TUBAL LIGATION      Family History  Problem Relation Age of Onset  . Hypertension Mother   . Hyperlipidemia Mother   . Heart disease Father        CAD; stent at age 21  . Diabetes Father   . Hypertension Father   . Hyperlipidemia Father   . Cancer Father        prostate  . Dementia Father   . Cancer Paternal Aunt        BREAST  . Breast cancer Paternal Aunt     Allergies  Allergen Reactions  . Shrimp [Shellfish Allergy] Itching    Rash and lips itching  . Ace Inhibitors     Cough  .  Aspirin Nausea And Vomiting  . Azithromycin Rash    Current Outpatient Prescriptions on File Prior to Visit  Medication Sig Dispense Refill  . albuterol (PROVENTIL HFA;VENTOLIN HFA) 108 (90 Base) MCG/ACT inhaler Inhale 2 puffs into the lungs every 6 (six) hours as needed for wheezing or shortness of breath. 1 Inhaler 0  . fluticasone (FLONASE) 50 MCG/ACT nasal spray PLACE 2 SPRAYS INTO BOTH NOSTRILS DAILY. 16 g 2  . gabapentin (NEURONTIN) 300 MG capsule TAKE 1 CAPSULE BY MOUTH DAILY IN THE MORNING, 1 AT NOON, AND 2 AT BEDTIME 120 capsule 5  . ibuprofen (ADVIL,MOTRIN) 600 MG tablet Take 1 tablet twice a day as needed with food.  0  . levocetirizine (XYZAL) 5 MG tablet Take 1 tablet (5 mg total) by mouth every evening. 30 tablet 2  . LINZESS 145 MCG CAPS capsule TAKE 1 CAPSULE (145 MCG TOTAL) BY MOUTH DAILY. 30 capsule 5  . methocarbamol (ROBAXIN) 500 MG tablet Take 500 mg by mouth 2 (two) times daily as needed.  2  . oxyCODONE-acetaminophen (PERCOCET)  7.5-325 MG tablet Take 1 tablet by mouth at bedtime as needed.  0  . pantoprazole (PROTONIX) 40 MG tablet TAKE 1 TABLET (40 MG TOTAL) BY MOUTH DAILY. 30 tablet 3  . potassium chloride SA (K-DUR,KLOR-CON) 20 MEQ tablet Take 1 tablet (20 mEq total) by mouth 2 (two) times daily. (Patient taking differently: Take 20 mEq by mouth daily. ) 60 tablet 5  . pravastatin (PRAVACHOL) 80 MG tablet Take 1 tablet (80 mg total) by mouth daily. 90 tablet 1   No current facility-administered medications on file prior to visit.     BP 134/61 (BP Location: Right Arm, Cuff Size: Large)   Pulse 78   Temp 98.2 F (36.8 C) (Oral)   Resp 18   Ht 5\' 6"  (1.676 m)   Wt 210 lb 3.2 oz (95.3 kg)   LMP 11/25/1994   SpO2 100%   BMI 33.93 kg/m       Objective:   Physical Exam  Constitutional: She is oriented to person, place, and time. She appears well-developed and well-nourished.  Cardiovascular: Normal rate, regular rhythm and normal heart sounds.   No murmur heard. Pulmonary/Chest: Effort normal and breath sounds normal. No respiratory distress. She has no wheezes.  Musculoskeletal: She exhibits no edema.  Neurological: She is alert and oriented to person, place, and time.  Psychiatric: She has a normal mood and affect. Her behavior is normal. Judgment and thought content normal.          Assessment & Plan:  HTN- stable. Advised pt to start the 2.5mg  once daily.  Hypokalemia- reports that she is only taking kdur once daily. Repeat bmet. If still low will need to bump back up to bid.

## 2017-08-18 NOTE — Telephone Encounter (Signed)
Patient would like to complete cologuard please.

## 2017-08-18 NOTE — Patient Instructions (Signed)
Please complete lab work prior to leaving. Start amlodipine 2.5 mg once daily.

## 2017-08-21 ENCOUNTER — Telehealth: Payer: Self-pay | Admitting: Family

## 2017-08-21 DIAGNOSIS — E876 Hypokalemia: Secondary | ICD-10-CM

## 2017-08-21 NOTE — Telephone Encounter (Signed)
Pt aware to increase K to bid and she is scheduled for lab visit 1 week for BMP with Hypokalemia/future order placed/thx dmf

## 2017-08-21 NOTE — Telephone Encounter (Signed)
Please advise patient that her potassium is low. I would like her to go back to the twice daily dosing of her potassium supplement. Repeat basic metabolic panel in 1 week. Diagnosis is hypokalemia

## 2017-08-22 NOTE — Telephone Encounter (Signed)
MO-Patient asked about doing the Cologuard test/Per the chart last Colonoscopy was 1.1.2010 and not due till 2020/Plz advise if you want to have her do this/thx dmf

## 2017-08-22 NOTE — Telephone Encounter (Signed)
We can order if pt is willing.

## 2017-08-25 ENCOUNTER — Telehealth: Payer: Self-pay | Admitting: *Deleted

## 2017-08-25 MED ORDER — AMLODIPINE BESYLATE 2.5 MG PO TABS: 2.5000 mg | ORAL_TABLET | Freq: Every day | ORAL | 1 refills | Status: DC

## 2017-08-25 MED ORDER — PRAVASTATIN SODIUM 80 MG PO TABS: 80.0000 mg | ORAL_TABLET | Freq: Every day | ORAL | 1 refills | Status: DC

## 2017-08-25 NOTE — Telephone Encounter (Signed)
Rx was sent to [facility] on 08/18/17. We received fax on Friday stating they are unable to verify pt's eligibility and wanted [place] to contact pt to verify Member ID,and demographic info. Left message for pt to return my call. If pt has not set up an account with [facility], she needs to call them and set that up so they can process Rx.

## 2017-08-25 NOTE — Telephone Encounter (Signed)
Pt is agreeable to proceed with cologuard.

## 2017-08-25 NOTE — Telephone Encounter (Signed)
Notified pt and she states she uses [facility] order. Pharmacy updated and rxs re-sent.

## 2017-08-26 NOTE — Telephone Encounter (Signed)
Order placed

## 2017-08-28 ENCOUNTER — Other Ambulatory Visit: Payer: BLUE CROSS/BLUE SHIELD

## 2017-10-13 ENCOUNTER — Telehealth: Payer: Self-pay | Admitting: Family

## 2017-10-13 MED ORDER — GABAPENTIN 300 MG PO CAPS: ORAL_CAPSULE | ORAL | 1 refills | Status: DC

## 2017-10-13 MED ORDER — POTASSIUM CHLORIDE CRYS ER 20 MEQ PO TBCR: 20.0000 meq | EXTENDED_RELEASE_TABLET | Freq: Two times a day (BID) | ORAL | 1 refills | Status: DC

## 2017-10-13 MED ORDER — PANTOPRAZOLE SODIUM 40 MG PO TBEC: DELAYED_RELEASE_TABLET | ORAL | 1 refills | Status: DC

## 2017-10-13 NOTE — Telephone Encounter (Signed)
Patient requesting refills for gabapentin, potassium, pantoprazole fax to [facility].

## 2017-10-13 NOTE — Telephone Encounter (Signed)
Refills sent

## 2017-11-04 ENCOUNTER — Ambulatory Visit: Payer: BLUE CROSS/BLUE SHIELD | Admitting: Family

## 2017-11-07 ENCOUNTER — Encounter: Payer: Self-pay | Admitting: Family

## 2017-11-07 ENCOUNTER — Ambulatory Visit (INDEPENDENT_AMBULATORY_CARE_PROVIDER_SITE_OTHER): Payer: BLUE CROSS/BLUE SHIELD | Admitting: Family

## 2017-11-07 VITALS — BP 140/72 | HR 82 | Temp 98.5°F | Resp 16 | Ht 66.0 in | Wt 205.0 lb

## 2017-11-07 DIAGNOSIS — R0789 Other chest pain: Secondary | ICD-10-CM

## 2017-11-07 DIAGNOSIS — E876 Hypokalemia: Secondary | ICD-10-CM

## 2017-11-07 DIAGNOSIS — M25512 Pain in left shoulder: Secondary | ICD-10-CM | POA: Diagnosis not present

## 2017-11-07 DIAGNOSIS — K219 Gastro-esophageal reflux disease without esophagitis: Secondary | ICD-10-CM | POA: Diagnosis not present

## 2017-11-07 MED ORDER — AMLODIPINE BESYLATE 5 MG PO TABS: 5.0000 mg | ORAL_TABLET | Freq: Every day | ORAL | 0 refills | Status: DC

## 2017-11-07 MED ORDER — AMLODIPINE BESYLATE 5 MG PO TABS: 5.0000 mg | ORAL_TABLET | Freq: Every day | ORAL | 1 refills | Status: DC

## 2017-11-07 MED FILL — AMLODIPINE BESYLATE 5 MG TA: 5 | 30 days supply | Qty: 30 | Fill #0

## 2017-11-07 NOTE — Progress Notes (Signed)
Subjective:    Patient ID: Jade [name], female    DOB: Apr 17, 1956, 60 y.o.   MRN: [id number]  HPI  Jade [name] is a 61 yr old female who presents today for follow up.  1) HTN-reports that she has been taking 2 of the 2.5 mg tabs once daily. BP Readings from Last 3 Encounters:  11/07/17 140/72  08/18/17 134/61  07/16/17 (!) 146/75   2) Shoulder pain-reports some morning she has some left hand stiffness. Only occurs in the cold weather.   3) gerd- uses protonix which helps sometimes.  Has occasional breakthrough symptoms which resolve with alkaselzer prn.    Review of Systems    see HPI  Past Medical History:  Diagnosis Date  . Benign microscopic hematuria    neg cystoscopy 11/12- dr. Janice [name]  . DJD (degenerative joint disease)    L KNEE  . Family history of anesthesia complication    multiple family members with history of this  . GERD (gastroesophageal reflux disease)   . Hyperlipidemia   . Hypertension   . Malignant hyperthermia   . Numbness and tingling in left arm      Social History   Socioeconomic History  . Marital status: Married    Spouse name: Not on file  . Number of children: 3  . Years of education: Not on file  . Highest education level: Not on file  Social Needs  . Financial resource strain: Not on file  . Food insecurity - worry: Not on file  . Food insecurity - inability: Not on file  . Transportation needs - medical: Not on file  . Transportation needs - non-medical: Not on file  Occupational History  . Not on file  Tobacco Use  . Smoking status: Current Every Day Smoker    Packs/day: 0.50    Years: 39.00    Pack years: 19.50    Types: Cigarettes  . Smokeless tobacco: Never Used  . Tobacco comment: 1 1/2 cigarettes daily  Substance and Sexual Activity  . Alcohol use: No    Alcohol/week: 0.0 oz  . Drug use: No  . Sexual activity: Yes    Partners: Male  Other Topics Concern  . Not on file  Social History Narrative   Regular  exercise:  No   Caffeine Use:  2 cups coffee and 3-4 glasses of soda daily.   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    Past Surgical History:  Procedure Laterality Date  . ABDOMINAL HYSTERECTOMY  1996  . BREAST EXCISIONAL BIOPSY Left 01/2016  . BREAST EXCISIONAL BIOPSY Left 03/2016  . CERVICAL DISC SURGERY  2013   Dr. Arnoldo [name]  . DILATION AND CURETTAGE OF UTERUS    . KNEE CARTILAGE SURGERY  1999   right knee  . ROTATOR CUFF REPAIR Right 05/25/13  . TOTAL KNEE ARTHROPLASTY Left 01/20/2014   Procedure: LEFT TOTAL KNEE ARTHROPLASTY;  Surgeon: Jade Hai, MD;  Location: [facility];  Service: Orthopedics;  Laterality: Left;  . TOTAL KNEE ARTHROPLASTY Right 01/19/2015   Procedure: RIGHT TOTAL KNEE ARTHROPLASTY;  Surgeon: Jade Hai, MD;  Location: [facility];  Service: Orthopedics;  Laterality: Right;  . TUBAL LIGATION      Family History  Problem Relation Age of Onset  . Hypertension Mother   . Hyperlipidemia Mother   . Heart disease Father        CAD; stent at  age 51  . Diabetes Father   . Hypertension Father   . Hyperlipidemia Father   . Cancer Father        prostate  . Dementia Father   . Cancer Paternal Aunt        BREAST  . Breast cancer Paternal Aunt     Allergies  Allergen Reactions  . Shrimp [Shellfish Allergy] Itching    Rash and lips itching  . Ace Inhibitors     Cough  . Aspirin Nausea And Vomiting  . Azithromycin Rash    Current Outpatient Medications on File Prior to Visit  Medication Sig Dispense Refill  . albuterol (PROVENTIL HFA;VENTOLIN HFA) 108 (90 Base) MCG/ACT inhaler Inhale 2 puffs into the lungs every 6 (six) hours as needed for wheezing or shortness of breath. 1 Inhaler 0  . fluticasone (FLONASE) 50 MCG/ACT nasal spray PLACE 2 SPRAYS INTO BOTH NOSTRILS DAILY. 16 g 2  . gabapentin (NEURONTIN) 300 MG capsule TAKE 1 CAPSULE BY MOUTH DAILY IN THE MORNING, 1 AT NOON, AND 2 AT BEDTIME 360  capsule 1  . ibuprofen (ADVIL,MOTRIN) 600 MG tablet Take 1 tablet twice a day as needed with food.  0  . levocetirizine (XYZAL) 5 MG tablet Take 1 tablet (5 mg total) by mouth every evening. 30 tablet 2  . LINZESS 145 MCG CAPS capsule TAKE 1 CAPSULE (145 MCG TOTAL) BY MOUTH DAILY. 30 capsule 5  . methocarbamol (ROBAXIN) 500 MG tablet Take 500 mg by mouth 2 (two) times daily as needed.  2  . oxyCODONE-acetaminophen (PERCOCET) 7.5-325 MG tablet Take 1 tablet by mouth at bedtime as needed.  0  . pantoprazole (PROTONIX) 40 MG tablet TAKE 1 TABLET (40 MG TOTAL) BY MOUTH DAILY. 90 tablet 1  . potassium chloride SA (K-DUR,KLOR-CON) 20 MEQ tablet Take 1 tablet (20 mEq total) 2 (two) times daily by mouth. 180 tablet 1  . pravastatin (PRAVACHOL) 80 MG tablet Take 1 tablet (80 mg total) by mouth daily. 90 tablet 1   No current facility-administered medications on file prior to visit.     BP 140/72 (BP Location: Right Arm, Patient Position: Sitting, Cuff Size: Large)   Pulse 82   Temp 98.5 F (36.9 C) (Oral)   Resp 16   Ht 5\' 6"  (1.676 m)   Wt 205 lb (93 kg)   LMP 11/25/1994   SpO2 100%   BMI 33.09 kg/m    Objective:   Physical Exam  Constitutional: She appears well-developed and well-nourished.  Cardiovascular: Normal rate, regular rhythm and normal heart sounds.  No murmur heard. Pulmonary/Chest: Effort normal and breath sounds normal. No respiratory distress. She has no wheezes.  Psychiatric: She has a normal mood and affect. Her behavior is normal. Judgment and thought content normal.  Musculoskeletal: Left shoulder is tender to palpation and tender with passive ROM.         Assessment & Plan:  Atypical CP-  EKG appears unchanged compared to prior EKG- notes NSR.  I really think her symptoms are related to her GERD as they are intermittent and resolved with antacid.  Left shoulder pain-she has history of rotator cuff repair on the right.  We discussed referral to orthopedics however  she declines at this time.  Recommended Tylenol as needed.  Hypertension-blood pressure is acceptable.  Will change to 5 mg tablets of amlodipine since she is doubling her 2.5mg s  GERD-continue Protonix fair control.  Hypokalemia- last K+ was low, check bmet.

## 2017-11-07 NOTE — Patient Instructions (Addendum)
Please change your amlodipine from 2.5 mg (two tabs) to 5mg  (one tab) once daily. Please schedule follow up with Leavy Heatherly in 6 months.

## 2017-11-08 LAB — BASIC METABOLIC PANEL
BUN: 13 mg/dL (ref 7–25)
CO2: 25 mmol/L (ref 20–32)
Calcium: 9.5 mg/dL (ref 8.6–10.4)
Chloride: 105 mmol/L (ref 98–110)
Creat: 0.83 mg/dL (ref 0.50–0.99)
Glucose, Bld: 89 mg/dL (ref 65–99)
Potassium: 3.8 mmol/L (ref 3.5–5.3)
Sodium: 142 mmol/L (ref 135–146)

## 2017-11-10 ENCOUNTER — Encounter: Payer: Self-pay | Admitting: Family

## 2017-11-25 ENCOUNTER — Emergency Department (HOSPITAL_BASED_OUTPATIENT_CLINIC_OR_DEPARTMENT_OTHER)
Admission: EM | Admit: 2017-11-25 | Discharge: 2017-11-25 | Disposition: A | Discharge status: Home / Self Care | Payer: BLUE CROSS/BLUE SHIELD | Attending: Emergency Medicine | Admitting: Emergency Medicine

## 2017-11-25 ENCOUNTER — Emergency Department (HOSPITAL_BASED_OUTPATIENT_CLINIC_OR_DEPARTMENT_OTHER): Payer: BLUE CROSS/BLUE SHIELD

## 2017-11-25 ENCOUNTER — Encounter (HOSPITAL_BASED_OUTPATIENT_CLINIC_OR_DEPARTMENT_OTHER): Payer: Self-pay | Admitting: *Deleted

## 2017-11-25 ENCOUNTER — Other Ambulatory Visit: Payer: Self-pay

## 2017-11-25 DIAGNOSIS — F17210 Nicotine dependence, cigarettes, uncomplicated: Secondary | ICD-10-CM | POA: Diagnosis not present

## 2017-11-25 DIAGNOSIS — M7122 Synovial cyst of popliteal space [Baker], left knee: Secondary | ICD-10-CM | POA: Diagnosis not present

## 2017-11-25 DIAGNOSIS — Z79899 Other long term (current) drug therapy: Secondary | ICD-10-CM | POA: Insufficient documentation

## 2017-11-25 DIAGNOSIS — Z96653 Presence of artificial knee joint, bilateral: Secondary | ICD-10-CM | POA: Insufficient documentation

## 2017-11-25 DIAGNOSIS — M79605 Pain in left leg: Secondary | ICD-10-CM

## 2017-11-25 DIAGNOSIS — I10 Essential (primary) hypertension: Secondary | ICD-10-CM | POA: Insufficient documentation

## 2017-11-25 NOTE — ED Triage Notes (Signed)
She woke with pain in her left calf. No injury.

## 2017-11-25 NOTE — ED Provider Notes (Signed)
[facility] Provider Note   CSN: [id number] Arrival date & time: 11/25/17  1039     History   Chief Complaint Chief Complaint  Patient presents with  . Leg Pain    HPI JESELLE [name] is a 62 y.o. female.  Patient is a 62 year old female with a history of hypertension, hyperlipidemia, degenerative joint disease status post bilateral knee replacements, chronic pain from disc disease in her back who currently takes OxyContin, gabapentin, Flexeril and ibuprofen presenting today after sudden onset of pain in her left calf.  Patient states she was fine when she woke up this morning and after getting dressed started developing a severe pain in her left calf which worsened after working for 4 hours today.  She initially can walk on it but now even walking makes the pain worse.  She is never had anything quite like this and does not feel that it is typical sciatic pain.  She denies any prior DVTs or clotting disorders in her family.  She has had no trauma.  No redness or swelling noted.  No numbness or tingling of her feet.   The history is provided by the patient.  Leg Pain   This is a new problem. The current episode started 3 to 5 hours ago. The problem occurs constantly. The problem has been gradually worsening. The pain is present in the left lower leg. The quality of the pain is described as aching, sharp and constant. The pain is at a severity of 9/10. The pain is severe. Associated symptoms include stiffness. Associated symptoms comments: Sharp pain. The symptoms are aggravated by standing and activity. Treatments tried: took an extra muscle relaxer. The treatment provided no relief. There has been no history of extremity trauma.    Past Medical History:  Diagnosis Date  . Benign microscopic hematuria    neg cystoscopy 11/12- dr. Janice Norrie  . DJD (degenerative joint disease)    L KNEE  . Family history of anesthesia complication    multiple family members  with history of this  . GERD (gastroesophageal reflux disease)   . Hyperlipidemia   . Hypertension   . Malignant hyperthermia   . Numbness and tingling in left arm     Patient Active Problem List   Diagnosis Date Noted  . 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 IBS (irritable bowel syndrome) 08/29/2014  . Pelvic pain in female 07/04/2014  . DJD (degenerative joint disease) of knee 01/20/2014  . Back pain 12/27/2013  . Dyslipidemia 12/27/2013  . Hot flashes 12/27/2013  . Daytime somnolence 07/28/2013  . Borderline diabetes 03/26/2013  . Knee pain, bilateral 03/26/2013  . Cervical disc disease 03/26/2013  . Allergic rhinitis 03/04/2012  . HTN (hypertension) 10/11/2011  . General medical examination 07/16/2011  . Tobacco abuse 07/16/2011  . Joint pain 07/16/2011  . Abnormal EKG 07/16/2011  . Microscopic hematuria 07/16/2011  . GERD (gastroesophageal reflux disease) 06/24/2011    Past Surgical History:  Procedure Laterality Date  . ABDOMINAL HYSTERECTOMY  1996  . BREAST EXCISIONAL BIOPSY Left 01/2016  . BREAST EXCISIONAL BIOPSY Left 03/2016  . CERVICAL DISC SURGERY  2013   Dr. Arnoldo Morale  . DILATION AND CURETTAGE OF UTERUS    . KNEE CARTILAGE SURGERY  1999   right knee  . ROTATOR CUFF REPAIR Right 05/25/13  . TOTAL KNEE ARTHROPLASTY Left 01/20/2014   Procedure: LEFT TOTAL KNEE ARTHROPLASTY;  Surgeon: Johnn Hai, MD;  Location: [facility];  Service: Orthopedics;  Laterality:  Left;  . TOTAL KNEE ARTHROPLASTY Right 01/19/2015   Procedure: RIGHT TOTAL KNEE ARTHROPLASTY;  Surgeon: Johnn Hai, MD;  Location: [facility];  Service: Orthopedics;  Laterality: Right;  . TUBAL LIGATION      OB History    No data available       Home Medications    Prior to Admission medications   Medication Sig Start Date End Date Taking? Authorizing Provider  albuterol (PROVENTIL HFA;VENTOLIN HFA) 108 (90 Base) MCG/ACT inhaler Inhale 2 puffs into the lungs every 6 (six) hours as needed for wheezing or  shortness of breath. 03/27/17   Shelda Pal, DO  amLODipine (NORVASC) 5 MG tablet Take 1 tablet (5 mg total) by mouth daily. 11/07/17   Debbrah Alar, NP  fluticasone (FLONASE) 50 MCG/ACT nasal spray PLACE 2 SPRAYS INTO BOTH NOSTRILS DAILY. 10/09/16   Debbrah Alar, NP  gabapentin (NEURONTIN) 300 MG capsule TAKE 1 CAPSULE BY MOUTH DAILY IN THE MORNING, 1 AT NOON, AND 2 AT BEDTIME 10/13/17   Debbrah Alar, NP  ibuprofen (ADVIL,MOTRIN) 600 MG tablet Take 1 tablet twice a day as needed with food. 03/21/17   [provider]  levocetirizine (XYZAL) 5 MG tablet Take 1 tablet (5 mg total) by mouth every evening. 03/27/17   Wendling, Crosby Oyster, DO  LINZESS 145 MCG CAPS capsule TAKE 1 CAPSULE (145 MCG TOTAL) BY MOUTH DAILY. 07/05/16   Debbrah Alar, NP  methocarbamol (ROBAXIN) 500 MG tablet Take 500 mg by mouth 2 (two) times daily as needed. 10/21/16   [provider]  oxyCODONE-acetaminophen (PERCOCET) 7.5-325 MG tablet Take 1 tablet by mouth at bedtime as needed. 10/23/16   [provider]  pantoprazole (PROTONIX) 40 MG tablet TAKE 1 TABLET (40 MG TOTAL) BY MOUTH DAILY. 10/13/17   Debbrah Alar, NP  potassium chloride SA (K-DUR,KLOR-CON) 20 MEQ tablet Take 1 tablet (20 mEq total) 2 (two) times daily by mouth. 10/13/17   Debbrah Alar, NP  pravastatin (PRAVACHOL) 80 MG tablet Take 1 tablet (80 mg total) by mouth daily. 08/25/17   Debbrah Alar, NP    Family History Family History  Problem Relation Age of Onset  . Hypertension Mother   . Hyperlipidemia Mother   . Heart disease Father        CAD; stent at age 57  . Diabetes Father   . Hypertension Father   . Hyperlipidemia Father   . Cancer Father        prostate  . Dementia Father   . Cancer Paternal Aunt        BREAST  . Breast cancer Paternal Aunt     Social History Social History   Tobacco Use  . Smoking status: Current Every Day Smoker    Packs/day: 0.50     Years: 39.00    Pack years: 19.50    Types: Cigarettes  . Smokeless tobacco: Never Used  . Tobacco comment: 1 1/2 cigarettes daily  Substance Use Topics  . Alcohol use: No    Alcohol/week: 0.0 oz  . Drug use: No     Allergies   Shrimp [shellfish allergy]; Ace inhibitors; Aspirin; and Azithromycin   Review of Systems Review of Systems  Musculoskeletal: Positive for stiffness.  All other systems reviewed and are negative.    Physical Exam Updated Vital Signs BP 139/79   Pulse 72   Temp 98.1 F (36.7 C) (Oral)   Resp 18   Ht 5\' 6"  (1.676 m)   Wt 93 kg (205 lb)  LMP 11/25/1994   SpO2 100%   BMI 33.09 kg/m   Physical Exam  Constitutional: She is oriented to person, place, and time. She appears well-developed and well-nourished. No distress.  HENT:  Head: Normocephalic and atraumatic.  Eyes: Pupils are equal, round, and reactive to light.  Cardiovascular: Normal rate.  Pulmonary/Chest: Effort normal.  Musculoskeletal: She exhibits tenderness.       Legs: Neurological: She is alert and oriented to person, place, and time.  Skin: Skin is warm. Capillary refill takes less than 2 seconds.  Psychiatric: She has a normal mood and affect. Her behavior is normal.  Nursing note and vitals reviewed.    ED Treatments / Results  Labs (all labs ordered are listed, but only abnormal results are displayed) Labs Reviewed - No data to display  EKG  EKG Interpretation None       Radiology US Venous Img Lower  Left (dvt Study)  Result Date: 11/25/2017 CLINICAL DATA:  62 year old female with a history of left calf cramps. EXAM: LEFT LOWER EXTREMITY VENOUS DOPPLER ULTRASOUND TECHNIQUE: Gray-scale sonography with graded compression, as well as color Doppler and duplex ultrasound were performed to evaluate the lower extremity deep venous systems from the level of the common femoral vein and including the common femoral, femoral, profunda femoral, popliteal and calf veins  including the posterior tibial, peroneal and gastrocnemius veins when visible. The superficial great saphenous vein was also interrogated. Spectral Doppler was utilized to evaluate flow at rest and with distal augmentation maneuvers in the common femoral, femoral and popliteal veins. COMPARISON:  None. FINDINGS: Contralateral Common Femoral Vein: Respiratory phasicity is normal and symmetric with the symptomatic side. No evidence of thrombus. Normal compressibility. Common Femoral Vein: No evidence of thrombus. Normal compressibility, respiratory phasicity and response to augmentation. Saphenofemoral Junction: No evidence of thrombus. Normal compressibility and flow on color Doppler imaging. Profunda Femoral Vein: No evidence of thrombus. Normal compressibility and flow on color Doppler imaging. Femoral Vein: No evidence of thrombus. Normal compressibility, respiratory phasicity and response to augmentation. Popliteal Vein: No evidence of thrombus. Normal compressibility, respiratory phasicity and response to augmentation. Calf Veins: No evidence of thrombus. Normal compressibility and flow on color Doppler imaging. Superficial Great Saphenous Vein: No evidence of thrombus. Normal compressibility and flow on color Doppler imaging. Other Findings: Complex cystic structure in the popliteal region of the left knee with internal septations measures 5.9 cm x 2.3 cm x 6.3 cm. No internal color flow. IMPRESSION: Sonographic survey of the left lower extremity negative for DVT. Baker's cyst of the left knee, measuring as large is 6.3 cm. Electronically Signed   By: Corrie Mckusick D.O.   On: 11/25/2017 13:03    Procedures Procedures (including critical care time)  Medications Ordered in ED Medications - No data to display   Initial Impression / Assessment and Plan / ED Course  I have reviewed the triage vital signs and the nursing notes.  Pertinent labs & imaging results that were available during my care of the  patient were reviewed by me and considered in my medical decision making (see chart for details).     It was sudden onset of pain in her left calf area without trauma.  Could be a ruptured Baker's cyst versus DVT versus phlebitis.  No evidence of abscess or cellulitis.  Patient denies any chest pain or shortness of breath.  She has no evidence of CHF or fluid overload.  We will do an ultrasound to rule out above.  1:49  PM Ultrasound showing no DVT but is showing a large complex Baker's cyst.  This could be with the result of the patient's pain.  Patient placed in a knee sleeve.  She already takes pain medication and muscle relaxers at home.  She will follow-up with her orthopedist. Final Clinical Impressions(s) / ED Diagnoses   Final diagnoses:  Left leg pain  Baker's cyst of knee, left    ED Discharge Orders    None       Blanchie Dessert, MD 11/25/17 1350

## 2018-01-05 ENCOUNTER — Other Ambulatory Visit: Payer: Self-pay | Admitting: Family

## 2018-02-06 ENCOUNTER — Ambulatory Visit (INDEPENDENT_AMBULATORY_CARE_PROVIDER_SITE_OTHER): Payer: BLUE CROSS/BLUE SHIELD | Admitting: Family Medicine

## 2018-02-06 ENCOUNTER — Encounter: Payer: Self-pay | Admitting: Family Medicine

## 2018-02-06 VITALS — BP 132/76 | HR 84 | Temp 98.0°F | Ht 66.0 in | Wt 202.5 lb

## 2018-02-06 DIAGNOSIS — B379 Candidiasis, unspecified: Secondary | ICD-10-CM

## 2018-02-06 DIAGNOSIS — N3000 Acute cystitis without hematuria: Secondary | ICD-10-CM | POA: Diagnosis not present

## 2018-02-06 LAB — POC URINALSYSI DIPSTICK (AUTOMATED)
Bilirubin, UA: NEGATIVE
Blood, UA: NEGATIVE
Glucose, UA: NEGATIVE
Ketones, UA: NEGATIVE
Leukocytes, UA: NEGATIVE
Nitrite, UA: NEGATIVE
Protein, UA: NEGATIVE
Spec Grav, UA: >=1.03 — AB (ref 1.010–1.025)
Urobilinogen, UA: 0.2 E.U./dL
pH, UA: 6 (ref 5.0–8.0)

## 2018-02-06 MED ORDER — FLUCONAZOLE 150 MG PO TABS: 150.0000 mg | ORAL_TABLET | Freq: Once | ORAL | 0 refills | Status: AC

## 2018-02-06 MED ORDER — SULFAMETHOXAZOLE-TRIMETHOPRIM 800-160 MG PO TABS: 1.0000 | ORAL_TABLET | Freq: Two times a day (BID) | ORAL | 0 refills | Status: DC

## 2018-02-06 MED FILL — SULFAMETHOXAZOLE-TMP DS TAB: 800-160 | 3 days supply | Qty: 6 | Fill #0

## 2018-02-06 MED FILL — FLUCONAZOLE 150 MG TABS: 150 | 1 days supply | Qty: 1 | Fill #0

## 2018-02-06 NOTE — Addendum Note (Signed)
Addended by: Sharon Seller B on: 02/06/2018 11:15 AM   Modules accepted: Orders

## 2018-02-06 NOTE — Patient Instructions (Signed)
Continue staying well hydrated. We will be in contact regarding urine results.   Let us know if you need anything.

## 2018-02-06 NOTE — Progress Notes (Signed)
Pre visit review using our clinic review tool, if applicable. No additional management support is needed unless otherwise documented below in the visit note. 

## 2018-02-06 NOTE — Progress Notes (Signed)
Chief Complaint  Patient presents with  . Dysuria    one week    Jade [name] is a 62 y.o. female here for possible UTI.  Duration: 1 week. Symptoms: abd pressure and pain with urination Denies: urinary frequency, hematuria, urinary hesitancy, urinary retention, fever, nausea and vomiting, vaginal discharge Hx of recurrent UTI? No Denies new sexual partners. She thinks she may have a yeast infection also as she was on an abx a few weeks ago and is having some itching.   ROS:  Constitutional: denies fever GU: As noted in HPI  Past Medical History:  Diagnosis Date  . Benign microscopic hematuria    neg cystoscopy 11/12- dr. Janice Norrie  . DJD (degenerative joint disease)    L KNEE  . Family history of anesthesia complication    multiple family members with history of this  . GERD (gastroesophageal reflux disease)   . Hyperlipidemia   . Hypertension   . Malignant hyperthermia   . Numbness and tingling in left arm     BP 132/76 (BP Location: Left Arm, Patient Position: Sitting, Cuff Size: Large)   Pulse 84   Temp 98 F (36.7 C) (Oral)   Ht 5\' 6"  (1.676 m)   Wt 202 lb 8 oz (91.9 kg)   LMP 11/25/1994   SpO2 97%   BMI 32.68 kg/m  General: Awake, alert, appears stated age 11: MMM Heart: RRR, no murmurs Lungs: CTAB, normal respiratory effort, no accessory muscle usage Abd: BS+, soft, +TTP over suprapubic region, ND, no masses or organomegaly MSK: No CVA tenderness, neg Lloyd's sign Psych: Age appropriate judgment and insight  Acute cystitis without hematuria - Plan: sulfamethoxazole-trimethoprim (BACTRIM DS,SEPTRA DS) 800-160 MG tablet  Yeast infection - Plan: fluconazole (DIFLUCAN) 150 MG tablet  Orders as above. Tx s/s's until cx returns, ua neg.  Stay hydrated. Seek immediate care if pt starts to develop fevers, new/worsening symptoms, uncontrollable N/V. F/u prn. The patient voiced understanding and agreement to the plan.  [place],  DO 02/06/18 11:06 AM

## 2018-02-07 LAB — URINE CULTURE
MICRO NUMBER:: 90331564
SPECIMEN QUALITY:: ADEQUATE

## 2018-02-09 ENCOUNTER — Ambulatory Visit: Payer: BLUE CROSS/BLUE SHIELD | Admitting: Family

## 2018-04-09 ENCOUNTER — Other Ambulatory Visit: Payer: Self-pay | Admitting: Family

## 2018-04-21 ENCOUNTER — Other Ambulatory Visit: Payer: Self-pay | Admitting: Family

## 2018-05-08 ENCOUNTER — Encounter: Payer: Self-pay | Admitting: Family

## 2018-05-08 ENCOUNTER — Ambulatory Visit (INDEPENDENT_AMBULATORY_CARE_PROVIDER_SITE_OTHER): Payer: BLUE CROSS/BLUE SHIELD | Admitting: Family

## 2018-05-08 VITALS — BP 129/70 | HR 77 | Temp 97.9°F | Resp 18 | Ht 66.0 in | Wt 200.0 lb

## 2018-05-08 DIAGNOSIS — I10 Essential (primary) hypertension: Secondary | ICD-10-CM

## 2018-05-08 DIAGNOSIS — E785 Hyperlipidemia, unspecified: Secondary | ICD-10-CM | POA: Diagnosis not present

## 2018-05-08 DIAGNOSIS — N643 Galactorrhea not associated with childbirth: Secondary | ICD-10-CM | POA: Diagnosis not present

## 2018-05-08 LAB — BASIC METABOLIC PANEL
BUN: 10 mg/dL (ref 6–23)
CO2: 29 mEq/L (ref 19–32)
Calcium: 9.4 mg/dL (ref 8.4–10.5)
Chloride: 104 mEq/L (ref 96–112)
Creatinine, Ser: 0.63 mg/dL (ref 0.40–1.20)
GFR: 122.45 mL/min (ref >60.00)
Glucose, Bld: 91 mg/dL (ref 70–99)
Potassium: 3.7 mEq/L (ref 3.5–5.1)
Sodium: 141 mEq/L (ref 135–145)

## 2018-05-08 LAB — TSH: TSH: 1.71 u[IU]/mL (ref 0.35–4.50)

## 2018-05-08 MED ORDER — ATORVASTATIN CALCIUM 20 MG PO TABS
20.0000 mg | ORAL_TABLET | Freq: Every day | ORAL | 3 refills | Status: DC
Start: 2018-05-08 — End: 2018-08-13

## 2018-05-08 MED ORDER — CEPHALEXIN 500 MG PO CAPS: 500.0000 mg | ORAL_CAPSULE | Freq: Four times a day (QID) | ORAL | 0 refills | Status: DC

## 2018-05-08 MED FILL — ATORVASTATIN 20 MG TABLET: 20 | 30 days supply | Qty: 30 | Fill #0

## 2018-05-08 MED FILL — CEPHALEXIN 500 MG CAPSULE: 500 | 5 days supply | Qty: 21 | Fill #0

## 2018-05-08 NOTE — Patient Instructions (Signed)
Please complete lab work prior to leaving. Schedule mammogram at the [facility]. Begin Keflex (antibiotic) to see if this helps with your symptoms. Start atorvastatin (cholesterol medication). Let me know if you develop palpitations on this medicine .

## 2018-05-08 NOTE — Progress Notes (Signed)
Subjective:    Patient ID: Jade [name], female    DOB: 04-01-1956, 62 y.o.   MRN: [id number]  HPI  Jade [name] is a 62 yr old female who presents today for follow up and also has concern of breast discharge.  Breast discharge- noted in the left breast. Last mammogram was 05/30/17.    HTN- mainatined on amlodipine 5mg .  BP Readings from Last 3 Encounters:  05/08/18 129/70  02/06/18 132/76  11/25/17 129/90   Hyperlipidemia- stopped due palpitations.  Lab Results  Component Value Date   CHOL 256 (H) 02/11/2017   HDL 43.90 02/11/2017   LDLCALC 172 (H) 02/13/2016   LDLDIRECT 178.0 02/11/2017   TRIG 205.0 (H) 02/11/2017   CHOLHDL 6 02/11/2017   Wt Readings from Last 3 Encounters:  05/08/18 200 lb (90.7 kg)  02/06/18 202 lb 8 oz (91.9 kg)  11/25/17 205 lb (93 kg)     Review of Systems See HPI  Past Medical History:  Diagnosis Date  . Benign microscopic hematuria    neg cystoscopy 11/12- dr. Janice Norrie  . DJD (degenerative joint disease)    L KNEE  . Family history of anesthesia complication    multiple family members with history of this  . GERD (gastroesophageal reflux disease)   . Hyperlipidemia   . Hypertension   . Malignant hyperthermia   . Numbness and tingling in left arm      Social History   Socioeconomic History  . Marital status: Married    Spouse name: Not on file  . Number of children: 3  . Years of education: Not on file  . Highest education level: Not on file  Occupational History  . Not on file  Social Needs  . Financial resource strain: Not on file  . Food insecurity:    Worry: Not on file    Inability: Not on file  . Transportation needs:    Medical: Not on file    Non-medical: Not on file  Tobacco Use  . Smoking status: Current Every Day Smoker    Packs/day: 0.50    Years: 39.00    Pack years: 19.50    Types: Cigarettes  . Smokeless tobacco: Never Used  . Tobacco comment: 1 1/2 cigarettes daily  Substance and Sexual Activity  .  Alcohol use: No    Alcohol/week: 0.0 oz  . Drug use: No  . Sexual activity: Yes    Partners: Male  Lifestyle  . Physical activity:    Days per week: Not on file    Minutes per session: Not on file  . Stress: Not on file  Relationships  . Social connections:    Talks on phone: Not on file    Gets together: Not on file    Attends religious service: Not on file    Active member of club or organization: Not on file    Attends meetings of clubs or organizations: Not on file    Relationship status: Not on file  . Intimate partner violence:    Fear of current or ex partner: Not on file    Emotionally abused: Not on file    Physically abused: Not on file    Forced sexual activity: Not on file  Other Topics Concern  . Not on file  Social History Narrative   Regular exercise:  No   Caffeine Use:  2 cups coffee and 3-4 glasses of soda daily.   Married, 3 children- grown   Works as a  CNA at [employer].   Completed 12th grade.    Past Surgical History:  Procedure Laterality Date  . ABDOMINAL HYSTERECTOMY  1996  . BREAST EXCISIONAL BIOPSY Left 01/2016  . BREAST EXCISIONAL BIOPSY Left 03/2016  . CERVICAL DISC SURGERY  2013   Dr. Arnoldo Morale  . DILATION AND CURETTAGE OF UTERUS    . KNEE CARTILAGE SURGERY  1999   right knee  . ROTATOR CUFF REPAIR Right 05/25/13  . 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 TOTAL KNEE ARTHROPLASTY Right 01/19/2015   Procedure: RIGHT TOTAL KNEE ARTHROPLASTY;  Surgeon: Johnn Hai, MD;  Location: [facility];  Service: Orthopedics;  Laterality: Right;  . TUBAL LIGATION      Family History  Problem Relation Age of Onset  . Hypertension Mother   . Hyperlipidemia Mother   . Heart disease Father        CAD; stent at age 24  . Diabetes Father   . Hypertension Father   . Hyperlipidemia Father   . Cancer  Father        prostate  . Dementia Father   . Cancer Paternal Aunt        BREAST  . Breast cancer Paternal Aunt     Allergies  Allergen Reactions  . Shrimp [Shellfish Allergy] Itching    Rash and lips itching  . Ace Inhibitors     Cough  . Aspirin Nausea And Vomiting  . Azithromycin Rash    Current Outpatient Medications on File Prior to Visit  Medication Sig Dispense Refill  . albuterol (PROVENTIL HFA;VENTOLIN HFA) 108 (90 Base) MCG/ACT inhaler Inhale 2 puffs into the lungs every 6 (six) hours as needed for wheezing or shortness of breath. 1 Inhaler 0  . amLODipine (NORVASC) 5 MG tablet Take 1 tablet (5 mg total) by mouth daily. 90 tablet 1  . fluticasone (FLONASE) 50 MCG/ACT nasal spray USE 2 SPRAYS IN EACH NOSTRIL EVERY DAY 48 g 1  . gabapentin (NEURONTIN) 300 MG capsule TAKE 1 CAPSULE BY MOUTH DAILY IN THE MORNING, 1 AT NOON, AND 2 AT BEDTIME 360 capsule 1  . ibuprofen (ADVIL,MOTRIN) 600 MG tablet Take 1 tablet twice a day as needed with food.  0  . LINZESS 145 MCG CAPS capsule TAKE 1 CAPSULE (145 MCG TOTAL) BY MOUTH DAILY. 30 capsule 5  . methocarbamol (ROBAXIN) 500 MG tablet Take 500 mg by mouth 2 (two) times daily as needed.  2  . oxyCODONE-acetaminophen (PERCOCET) 7.5-325 MG tablet Take 1 tablet by mouth at bedtime as needed.  0  . pantoprazole (PROTONIX) 40 MG tablet TAKE 1 TABLET (40 MG TOTAL) BY MOUTH DAILY. 90 tablet 1  . potassium chloride SA (K-DUR,KLOR-CON) 20 MEQ tablet TAKE 1 TABLET (20 MEQ TOTAL) 2 (TWO) TIMES DAILY  180 tablet 1  . pravastatin (PRAVACHOL) 80 MG tablet Take 1 tablet (80 mg total) by mouth daily. (Patient not taking: Reported on 05/08/2018) 90 tablet 1   No current facility-administered medications on file prior to visit.     BP 129/70 (BP Location: Right Arm, Patient Position: Sitting, Cuff Size: Normal)   Pulse 77   Temp 97.9 F (36.6 C) (Oral)   Resp 18   Ht 5\' 6"  (1.676 m)   Wt 200 lb (90.7 kg)   LMP 11/25/1994   SpO2 99%   BMI 32.28  kg/m       Objective:   Physical Exam  Constitutional: She is oriented to person, place, and time. She appears well-developed and well-nourished.  Cardiovascular: Normal rate, regular rhythm and normal heart sounds.  No murmur heard. Pulmonary/Chest: Effort normal and breath sounds normal. No respiratory distress. She has no wheezes.  Neurological: She is alert and oriented to person, place, and time.  Psychiatric: She has a normal mood and affect. Her behavior is normal. Judgment and thought content normal.  Breast- scarring at 11 oclock from previous biopsy. Foul smelling milky discharge is expressed.         Assessment & Plan:  Galactorrhea- had diagnostic mammo for same breast/symptoms. Imaging negative 7/18. Radiology recommended annual routine mammogram. Will obtain TSH, bmet, prolactin. Rx sent for keflex to cover for any infection. Pt advised to arrange her follow up annual mammogram in July.   Hyperlipidemia- did not tolerate pravastatin, will give trial of atorvastatin. She will let me know if she has side effects.  HTN- bp stable on current meds. Continue same.

## 2018-05-09 ENCOUNTER — Encounter: Payer: Self-pay | Admitting: Family

## 2018-05-09 LAB — PROLACTIN: Prolactin: 10.9 ng/mL

## 2018-05-12 ENCOUNTER — Other Ambulatory Visit: Payer: Self-pay | Admitting: Family

## 2018-05-12 DIAGNOSIS — Z1231 Encounter for screening mammogram for malignant neoplasm of breast: Secondary | ICD-10-CM

## 2018-06-01 ENCOUNTER — Ambulatory Visit
Admission: RE | Admit: 2018-06-01 | Discharge: 2018-06-01 | Disposition: A | Discharge status: Home / Self Care | Payer: BLUE CROSS/BLUE SHIELD | Source: Ambulatory Visit | Attending: Family | Admitting: Family

## 2018-06-01 DIAGNOSIS — Z1231 Encounter for screening mammogram for malignant neoplasm of breast: Secondary | ICD-10-CM

## 2018-07-20 ENCOUNTER — Other Ambulatory Visit: Payer: Self-pay

## 2018-07-20 MED ORDER — AMLODIPINE BESYLATE 5 MG PO TABS: 5.0000 mg | ORAL_TABLET | Freq: Every day | ORAL | 1 refills | Status: DC

## 2018-07-22 ENCOUNTER — Telehealth: Payer: Self-pay | Admitting: *Deleted

## 2018-07-22 MED ORDER — AMLODIPINE BESYLATE 5 MG PO TABS: 5.0000 mg | ORAL_TABLET | Freq: Every day | ORAL | 1 refills | Status: DC

## 2018-07-22 NOTE — Telephone Encounter (Signed)
Received fax from [facility] order requesting new Rx for amlodipine 5mg . Refills sent.

## 2018-07-30 ENCOUNTER — Other Ambulatory Visit: Payer: Self-pay | Admitting: Family

## 2018-07-30 MED ORDER — PANTOPRAZOLE SODIUM 40 MG PO TBEC: DELAYED_RELEASE_TABLET | ORAL | 1 refills | Status: DC

## 2018-07-30 MED FILL — PANTOPRAZOLE SOD DR 40 MG T: 40 | 90 days supply | Qty: 90 | Fill #0

## 2018-07-30 NOTE — Telephone Encounter (Signed)
Copied from [place] [id number]. Topic: Quick Communication - Rx Refill/Question >> Jul 30, 2018 11:45 AM Antonieta Iba C wrote: Medication: pantoprazole (PROTONIX) 40 MG tablet  Has the patient contacted their pharmacy?  (Agent: If no, request that the patient contact the pharmacy for the refill.) (Agent: If yes, when and what did the pharmacy advise?)  Preferred Pharmacy (with phone number or street name): [place], [place] - [address] [phone] (Phone) [phone] (Fax)    Agent: Please be advised that RX refills may take up to 3 business days. We ask that you follow-up with your pharmacy.

## 2018-08-10 ENCOUNTER — Encounter: Payer: Self-pay | Admitting: Family

## 2018-08-10 ENCOUNTER — Ambulatory Visit (INDEPENDENT_AMBULATORY_CARE_PROVIDER_SITE_OTHER): Payer: BLUE CROSS/BLUE SHIELD | Admitting: Family

## 2018-08-10 VITALS — BP 130/80 | HR 81 | Temp 98.3°F | Resp 18 | Ht 66.0 in | Wt 194.4 lb

## 2018-08-10 DIAGNOSIS — K589 Irritable bowel syndrome without diarrhea: Secondary | ICD-10-CM

## 2018-08-10 DIAGNOSIS — Z23 Encounter for immunization: Secondary | ICD-10-CM

## 2018-08-10 DIAGNOSIS — E785 Hyperlipidemia, unspecified: Secondary | ICD-10-CM | POA: Diagnosis not present

## 2018-08-10 DIAGNOSIS — Z0000 Encounter for general adult medical examination without abnormal findings: Secondary | ICD-10-CM

## 2018-08-10 DIAGNOSIS — K219 Gastro-esophageal reflux disease without esophagitis: Secondary | ICD-10-CM

## 2018-08-10 DIAGNOSIS — R739 Hyperglycemia, unspecified: Secondary | ICD-10-CM | POA: Diagnosis not present

## 2018-08-10 LAB — LIPID PANEL
Cholesterol: 235 mg/dL — ABNORMAL HIGH (ref 0–200)
HDL: 35.3 mg/dL — ABNORMAL LOW (ref >39.00)
NonHDL: 200.06
Total CHOL/HDL Ratio: 7
Triglycerides: 257 mg/dL — ABNORMAL HIGH (ref 0.0–149.0)
VLDL: 51.4 mg/dL — ABNORMAL HIGH (ref 0.0–40.0)

## 2018-08-10 LAB — BASIC METABOLIC PANEL
BUN: 10 mg/dL (ref 6–23)
CO2: 31 mEq/L (ref 19–32)
Calcium: 9.2 mg/dL (ref 8.4–10.5)
Chloride: 104 mEq/L (ref 96–112)
Creatinine, Ser: 0.77 mg/dL (ref 0.40–1.20)
GFR: 97.06 mL/min (ref >60.00)
Glucose, Bld: 72 mg/dL (ref 70–99)
Potassium: 3.6 mEq/L (ref 3.5–5.1)
Sodium: 140 mEq/L (ref 135–145)

## 2018-08-10 LAB — LDL CHOLESTEROL, DIRECT: Direct LDL: 175 mg/dL

## 2018-08-10 LAB — HEMOGLOBIN A1C: Hgb A1c MFr Bld: 6.1 % (ref 4.6–6.5)

## 2018-08-10 NOTE — Progress Notes (Signed)
Subjective:    Patient ID: Jade [name], female    DOB: 1956-11-17, 62 y.o.   MRN: [id number]  HPI  Patient is a 62 yr old female who presents today for follow up.  She is following with Dr. Nelva Bush for her chronic low back pain and she is maintained on oxycodone.  Continues gabapentin for pain and hot flashes.    Allergic rhinitis-  Reports some congestion. She is using flonase and zyrtec.   HTN- maintained on amlodipine 5mg  once daily.   BP Readings from Last 3 Encounters:  08/10/18 130/80  05/08/18 129/70  02/06/18 132/76   Hyperlipidemia- reports that she had palpitations on statin so she discontinued.  Lab Results  Component Value Date   CHOL 256 (H) 02/11/2017   HDL 43.90 02/11/2017   LDLCALC 172 (H) 02/13/2016   LDLDIRECT 178.0 02/11/2017   TRIG 205.0 (H) 02/11/2017   CHOLHDL 6 02/11/2017   gerd- maintained on protonix 40mg . Reports gerd is well controlled.   IBS- continues linzess.  Reports that she has had daily BM's.    Lab Results  Component Value Date   HGBA1C 6.2 02/11/2017    Review of Systems    see HPI  Past Medical History:  Diagnosis Date  . Benign microscopic hematuria    neg cystoscopy 11/12- dr. Janice Norrie  . DJD (degenerative joint disease)    L KNEE  . Family history of anesthesia complication    multiple family members with history of this  . GERD (gastroesophageal reflux disease)   . Hyperlipidemia   . Hypertension   . Malignant hyperthermia   . Numbness and tingling in left arm      Social History   Socioeconomic History  . Marital status: Married    Spouse name: Not on file  . Number of children: 3  . Years of education: Not on file  . Highest education level: Not on file  Occupational History  . Not on file  Social Needs  . Financial resource strain: Not on file  . Food insecurity:    Worry: Not on file    Inability: Not on file  . Transportation needs:    Medical: Not on file    Non-medical: Not on file  Tobacco Use    . Smoking status: Current Every Day Smoker    Packs/day: 1.00    Years: 39.00    Pack years: 39.00    Types: Cigarettes  . Smokeless tobacco: Never Used  Substance and Sexual Activity  . Alcohol use: No    Alcohol/week: 0.0 standard drinks  . Drug use: No  . Sexual activity: Yes    Partners: Male  Lifestyle  . Physical activity:    Days per week: Not on file    Minutes per session: Not on file  . Stress: Not on file  Relationships  . Social connections:    Talks on phone: Not on file    Gets together: Not on file    Attends religious service: Not on file    Active member of club or organization: Not on file    Attends meetings of clubs or organizations: Not on file    Relationship status: Not on file  . Intimate partner violence:    Fear of current or ex partner: Not on file    Emotionally abused: Not on file    Physically abused: Not on file    Forced sexual activity: Not on file  Other Topics Concern  .  Not on file  Social History Narrative   Regular exercise:  No   Caffeine Use:  2 cups coffee and 3-4 glasses of soda daily.   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    Past Surgical History:  Procedure Laterality Date  . ABDOMINAL HYSTERECTOMY  1996  . BREAST EXCISIONAL BIOPSY Left 01/2016  . BREAST EXCISIONAL BIOPSY Left 03/2016  . CERVICAL DISC SURGERY  2013   Dr. Arnoldo Morale  . DILATION AND CURETTAGE OF UTERUS    . KNEE CARTILAGE SURGERY  1999   right knee  . ROTATOR CUFF REPAIR Right 05/25/13  . 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 TOTAL KNEE ARTHROPLASTY Right 01/19/2015   Procedure: RIGHT TOTAL KNEE ARTHROPLASTY;  Surgeon: Johnn Hai, MD;  Location: [facility];  Service: Orthopedics;  Laterality: Right;  . TUBAL LIGATION      Family History  Problem Relation  Age of Onset  . Hypertension Mother   . Hyperlipidemia Mother   . Heart disease Father        CAD; stent at age 87  . Diabetes Father   . Hypertension Father   . Hyperlipidemia Father   . Cancer Father        prostate  . Dementia Father   . Cancer Paternal Aunt        BREAST  . Breast cancer Paternal Aunt     Allergies  Allergen Reactions  . Shrimp [Shellfish Allergy] Itching    Rash and lips itching  . Ace Inhibitors     Cough  . Aspirin Nausea And Vomiting  . Azithromycin Rash    Current Outpatient Medications on File Prior to Visit  Medication Sig Dispense Refill  . albuterol (PROVENTIL HFA;VENTOLIN HFA) 108 (90 Base) MCG/ACT inhaler Inhale 2 puffs into the lungs every 6 (six) hours as needed for wheezing or shortness of breath. 1 Inhaler 0  . amLODipine (NORVASC) 5 MG tablet Take 1 tablet (5 mg total) by mouth daily. 90 tablet 1  . atorvastatin (LIPITOR) 20 MG tablet Take 1 tablet (20 mg total) by mouth daily. 30 tablet 3  . fluticasone (FLONASE) 50 MCG/ACT nasal spray USE 2 SPRAYS IN EACH NOSTRIL EVERY DAY 48 g 1  . gabapentin (NEURONTIN) 300 MG capsule TAKE 1 CAPSULE BY MOUTH DAILY IN THE MORNING, 1 AT NOON, AND 2 AT BEDTIME 360 capsule 1  . ibuprofen (ADVIL,MOTRIN) 600 MG tablet Take 1 tablet twice a day as needed with food.  0  . LINZESS 145 MCG CAPS capsule TAKE 1 CAPSULE (145 MCG TOTAL) BY MOUTH DAILY. 30 capsule 5  . methocarbamol (ROBAXIN) 500 MG tablet Take 500 mg by mouth 2 (two) times daily as needed.  2  . oxyCODONE-acetaminophen (PERCOCET) 7.5-325 MG tablet Take 1 tablet by mouth at bedtime as needed.  0  . pantoprazole (PROTONIX) 40 MG tablet Use previous signature 90 tablet 1  . potassium chloride SA (K-DUR,KLOR-CON) 20 MEQ tablet TAKE 1 TABLET (20 MEQ TOTAL) 2 (TWO) TIMES DAILY  180 tablet 1   No current facility-administered medications on file prior to visit.     BP 130/80 (BP Location: Right Arm, Cuff Size: Normal)   Pulse 81   Temp 98.3 F (36.8  C) (Oral)   Resp 18   Ht  5\' 6"  (1.676 m)   Wt 194 lb 6.4 oz (88.2 kg)   LMP 11/25/1994   SpO2 97%   BMI 31.38 kg/m    Objective:   Physical Exam  Constitutional: She is oriented to person, place, and time. She appears well-developed and well-nourished.  Cardiovascular: Normal rate, regular rhythm and normal heart sounds.  No murmur heard. Pulmonary/Chest: Effort normal and breath sounds normal. No respiratory distress. She has no wheezes.  Musculoskeletal: She exhibits no edema.  Neurological: She is alert and oriented to person, place, and time.  Skin: Skin is warm and dry.  Psychiatric: She has a normal mood and affect. Her behavior is normal. Judgment and thought content normal.          Assessment & Plan:  HTN- bp stable, continue amlodipine.  GERD- stable on PPI. Continue same.  Hyperlipidemia- intolerant to statins, obtain follow up lipid panel.   IBS- stable on linzess, continue same.   Hyperglycemia- obtain follow up A1c.   Flu shot today.

## 2018-08-10 NOTE — Patient Instructions (Signed)
Please complete lab work prior to leaving.   

## 2018-08-13 ENCOUNTER — Telehealth: Payer: Self-pay | Admitting: Family

## 2018-08-13 MED ORDER — ROSUVASTATIN CALCIUM 20 MG PO TABS: 20.0000 mg | ORAL_TABLET | Freq: Every day | ORAL | 3 refills | Status: DC

## 2018-08-13 NOTE — Telephone Encounter (Signed)
Sugar is at goal.

## 2018-08-13 NOTE — Telephone Encounter (Signed)
Cholesterol is very high. Since she did not tolerate atorvastatin, let's give her a trial of crestor. Let me know if she has any side effects with crestor.

## 2018-08-13 NOTE — Addendum Note (Signed)
Addended by: Debbrah Alar on: 08/13/2018 05:08 PM   Modules accepted: Orders

## 2018-08-14 NOTE — Telephone Encounter (Signed)
Notified pt and she voices understanding. 

## 2018-08-26 ENCOUNTER — Ambulatory Visit (HOSPITAL_BASED_OUTPATIENT_CLINIC_OR_DEPARTMENT_OTHER)
Admission: RE | Admit: 2018-08-26 | Discharge: 2018-08-26 | Disposition: A | Discharge status: Home / Self Care | Payer: BLUE CROSS/BLUE SHIELD | Source: Ambulatory Visit | Attending: Specialist | Admitting: Specialist

## 2018-08-26 ENCOUNTER — Ambulatory Visit (HOSPITAL_COMMUNITY): Admission: RE | Admit: 2018-08-26 | Payer: BLUE CROSS/BLUE SHIELD | Source: Ambulatory Visit

## 2018-08-26 ENCOUNTER — Other Ambulatory Visit (HOSPITAL_COMMUNITY): Payer: Self-pay | Admitting: Specialist

## 2018-08-26 DIAGNOSIS — M79604 Pain in right leg: Secondary | ICD-10-CM | POA: Diagnosis not present

## 2018-08-26 DIAGNOSIS — M25461 Effusion, right knee: Secondary | ICD-10-CM | POA: Diagnosis not present

## 2018-08-26 DIAGNOSIS — M7989 Other specified soft tissue disorders: Secondary | ICD-10-CM

## 2018-08-28 ENCOUNTER — Telehealth: Payer: Self-pay | Admitting: *Deleted

## 2018-08-28 NOTE — Telephone Encounter (Signed)
Received Cologuard Order Cancellation: [id number]; order has been Cancelled because it has been Inactive, and has exceeded the 365 days from the Initial order; forwarded to provider/SLS 10/04

## 2018-09-17 ENCOUNTER — Telehealth: Payer: Self-pay

## 2018-09-17 NOTE — Telephone Encounter (Signed)
Refill requests for pantaprozole, amlodopine, and gabapentin sent via fax from [facility]. Too soon to fill, rx requests denied.

## 2018-09-29 MED ORDER — AMLODIPINE BESYLATE 5 MG PO TABS: 5.0000 mg | ORAL_TABLET | Freq: Every day | ORAL | 1 refills | Status: DC

## 2018-09-29 MED ORDER — PANTOPRAZOLE SODIUM 40 MG PO TBEC: DELAYED_RELEASE_TABLET | ORAL | 1 refills | Status: DC

## 2018-09-29 MED ORDER — GABAPENTIN 300 MG PO CAPS: ORAL_CAPSULE | ORAL | 1 refills | Status: DC

## 2018-09-29 MED ORDER — AMLODIPINE BESYLATE 5 MG PO TABS: 5.0000 mg | ORAL_TABLET | Freq: Every day | ORAL | 0 refills | Status: DC

## 2018-09-29 MED FILL — AMLODIPINE BESYLATE 5 MG TA: 5 | 30 days supply | Qty: 30 | Fill #0

## 2018-09-29 NOTE — Telephone Encounter (Signed)
Spoke with pt and apologized for the misunderstanding. Pt is changing to a new mail order pharmacy. Requested RXs sent and 30 day supply of amlodipine has been sent to local pharmacy as pt states she has been out for a few days.

## 2018-09-29 NOTE — Telephone Encounter (Signed)
Pt states she is trying to get her medication switched over to [facility] order pharmacy and she wants to know why this can't go ahead and be refilled.

## 2018-09-29 NOTE — Addendum Note (Signed)
Addended by: Kelle Darting A on: 09/29/2018 02:33 PM   Modules accepted: Orders

## 2018-10-26 ENCOUNTER — Other Ambulatory Visit: Payer: Self-pay | Admitting: Family

## 2018-10-26 NOTE — Telephone Encounter (Signed)
Copied from [place] [phone]. Topic: Quick Communication - See Telephone Encounter >> Oct 26, 2018 10:32 AM Conception Chancy, NT wrote: CRM for notification. See Telephone encounter for: 10/26/18.  Patient is calling and requesting a refill on the following medications.  amLODipine (NORVASC) 5 MG tablet pantoprazole (PROTONIX) 40 MG tablet (needing a 30 day supply, still waiting to get approved with [facility], they told her she needed to get a 30 day supply at local pharmacy.)  [place], [place] [address] Phone: [phone] Fax: [phone]

## 2018-10-27 MED ORDER — AMLODIPINE BESYLATE 5 MG PO TABS: 5.0000 mg | ORAL_TABLET | Freq: Every day | ORAL | 0 refills | Status: DC

## 2018-10-27 MED ORDER — PANTOPRAZOLE SODIUM 40 MG PO TBEC: DELAYED_RELEASE_TABLET | ORAL | 0 refills | Status: DC

## 2018-10-27 MED FILL — AMLODIPINE BESYLATE 5 MG TA: 5 | 90 days supply | Qty: 90 | Fill #0

## 2018-10-27 MED FILL — PANTOPRAZOLE SOD DR 40 MG T: 40 | 90 days supply | Qty: 90 | Fill #0

## 2018-10-27 NOTE — Telephone Encounter (Signed)
Requested Prescriptions  Pending Prescriptions Disp Refills  . amLODipine (NORVASC) 5 MG tablet 90 tablet 0    Sig: Take 1 tablet (5 mg total) by mouth daily.     Cardiovascular:  Calcium Channel Blockers Passed - 10/26/2018  2:58 PM      Passed - Last BP in normal range    BP Readings from Last 1 Encounters:  08/10/18 130/80         Passed - Valid encounter within last 6 months    Recent Outpatient Visits          2 months ago Irritable bowel syndrome, unspecified type   [facility] [place], NP   5 months ago [facility] [place], NP   8 months ago Acute cystitis without hematuria   [facility] [place], [place]   11 months ago Atypical chest pain   [facility] [place], NP   1 year ago Hypokalemia   [facility] [place], NP      Future Appointments            In 3 months Debbrah Alar, NP [facility], [place]         . pantoprazole (PROTONIX) 40 MG tablet 90 tablet 0    Sig: Use previous signature     Gastroenterology: Proton Pump Inhibitors Passed - 10/26/2018  2:58 PM      Passed - Valid encounter within last 12 months    Recent Outpatient Visits          2 months ago Irritable bowel syndrome, unspecified type   [facility] [place], NP   5 months ago [facility] [place], NP   8 months ago Acute cystitis without hematuria   [facility] [place], [place]   11 months ago Atypical chest pain   [facility] [place], NP   1 year  ago Hypokalemia   [facility] [place], NP      Future Appointments            In 3 months Debbrah Alar, NP [facility] at [facility], [facility]

## 2018-11-11 ENCOUNTER — Encounter: Payer: Self-pay | Admitting: Family

## 2019-01-20 MED FILL — PANTOPRAZOLE SOD DR 40 MG T: 40 | 90 days supply | Qty: 90 | Fill #1

## 2019-01-20 MED FILL — AMLODIPINE BESYLATE 5 MG TA: 5 | 90 days supply | Qty: 90 | Fill #0

## 2019-01-26 ENCOUNTER — Ambulatory Visit (INDEPENDENT_AMBULATORY_CARE_PROVIDER_SITE_OTHER): Payer: BC Managed Care – PPO | Admitting: Family

## 2019-01-26 ENCOUNTER — Encounter: Payer: Self-pay | Admitting: Family

## 2019-01-26 VITALS — BP 124/67 | HR 77 | Temp 98.7°F | Resp 16 | Ht 66.0 in | Wt 187.0 lb

## 2019-01-26 DIAGNOSIS — E042 Nontoxic multinodular goiter: Secondary | ICD-10-CM | POA: Diagnosis not present

## 2019-01-26 DIAGNOSIS — N644 Mastodynia: Secondary | ICD-10-CM

## 2019-01-26 DIAGNOSIS — J309 Allergic rhinitis, unspecified: Secondary | ICD-10-CM | POA: Diagnosis not present

## 2019-01-26 DIAGNOSIS — I10 Essential (primary) hypertension: Secondary | ICD-10-CM

## 2019-01-26 DIAGNOSIS — E785 Hyperlipidemia, unspecified: Secondary | ICD-10-CM

## 2019-01-26 LAB — TSH: TSH: 1.97 u[IU]/mL (ref 0.35–4.50)

## 2019-01-26 LAB — BASIC METABOLIC PANEL
BUN: 11 mg/dL (ref 6–23)
CO2: 27 mEq/L (ref 19–32)
Calcium: 9 mg/dL (ref 8.4–10.5)
Chloride: 102 mEq/L (ref 96–112)
Creatinine, Ser: 0.71 mg/dL (ref 0.40–1.20)
GFR: 100.13 mL/min (ref >60.00)
Glucose, Bld: 80 mg/dL (ref 70–99)
Potassium: 3.4 mEq/L — ABNORMAL LOW (ref 3.5–5.1)
Sodium: 139 mEq/L (ref 135–145)

## 2019-01-26 LAB — T4, FREE: Free T4: 0.97 ng/dL (ref 0.60–1.60)

## 2019-01-26 LAB — T3, FREE: T3, Free: 4 pg/mL (ref 2.3–4.2)

## 2019-01-26 MED ORDER — CEPHALEXIN 500 MG PO CAPS: 500.0000 mg | ORAL_CAPSULE | Freq: Two times a day (BID) | ORAL | 0 refills | Status: DC

## 2019-01-26 MED FILL — CEPHALEXIN 500 MG CAPSULE: 500 | 7 days supply | Qty: 14 | Fill #0

## 2019-01-26 NOTE — Patient Instructions (Signed)
Please complete lab work prior to leaving.  Schedule thyroid ultrasound on the first floor. Begin keflex for left breast infection. You should be contacted about your appointment at the breast center for left sided breast imaging.               [facility]

## 2019-01-26 NOTE — Progress Notes (Signed)
Subjective:    Patient ID: Jade [name], female    DOB: [birth year], 63 y.o.   MRN: [id number]  HPI  Patient is a 63 yr old female who presents today for follow up.  Allergic rhinitis-  Reports that she uses zyrtec as needed which helps along with flonase.   HTN- maintained on amlodipine 5mg .  BP Readings from Last 3 Encounters:  01/26/19 124/67  08/10/18 130/80  05/08/18 129/70   Hyperlipidemia- stopped statin. Still not taking.  Lab Results  Component Value Date   CHOL 235 (H) 08/10/2018   HDL 35.30 (L) 08/10/2018   LDLCALC 172 (H) 02/13/2016   LDLDIRECT 175.0 08/10/2018   TRIG 257.0 (H) 08/10/2018   CHOLHDL 7 08/10/2018   IBS- continues linzess but for ibs. Reports that she has a daily BM.  Takes in the AM.   Reports that she developed an infection again in the left breast. Reports that she is able to express a small amount of foul smelling drainage.    Wt Readings from Last 3 Encounters:  01/26/19 187 lb (84.8 kg)  08/10/18 194 lb 6.4 oz (88.2 kg)  05/08/18 200 lb (90.7 kg)      Review of Systems  See HPI  Past Medical History:  Diagnosis Date  . Benign microscopic hematuria    neg cystoscopy 11/12- dr. Janice Norrie  . DJD (degenerative joint disease)    L KNEE  . Family history of anesthesia complication    multiple family members with history of this  . GERD (gastroesophageal reflux disease)   . Hyperlipidemia   . Hypertension   . Malignant hyperthermia   . Numbness and tingling in left arm      Social History   Socioeconomic History  . Marital status: Married    Spouse name: Not on file  . Number of children: 3  . Years of education: Not on file  . Highest education level: Not on file  Occupational History  . Not on file  Social Needs  . Financial resource strain: Not on file  . Food insecurity:    Worry: Not on file    Inability: Not on file  . Transportation needs:    Medical: Not on file    Non-medical: Not on file  Tobacco Use  .  Smoking status: Current Every Day Smoker    Packs/day: 1.00    Years: 39.00    Pack years: 39.00    Types: Cigarettes  . Smokeless tobacco: Never Used  Substance and Sexual Activity  . Alcohol use: No    Alcohol/week: 0.0 standard drinks  . Drug use: No  . Sexual activity: Yes    Partners: Male  Lifestyle  . Physical activity:    Days per week: Not on file    Minutes per session: Not on file  . Stress: Not on file  Relationships  . Social connections:    Talks on phone: Not on file    Gets together: Not on file    Attends religious service: Not on file    Active member of club or organization: Not on file    Attends meetings of clubs or organizations: Not on file    Relationship status: Not on file  . Intimate partner violence:    Fear of current or ex partner: Not on file    Emotionally abused: Not on file    Physically abused: Not on file    Forced sexual activity: Not on file  Other Topics  Concern  . Not on file  Social History Narrative   Regular exercise:  No   Caffeine Use:  2 cups coffee and 3-4 glasses of soda daily.   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    Past Surgical History:  Procedure Laterality Date  . ABDOMINAL HYSTERECTOMY  1996  . BREAST EXCISIONAL BIOPSY Left 01/2016  . BREAST EXCISIONAL BIOPSY Left 03/2016  . CERVICAL DISC SURGERY  2013   Dr. Arnoldo Morale  . DILATION AND CURETTAGE OF UTERUS    . KNEE CARTILAGE SURGERY  1999   right knee  . ROTATOR CUFF REPAIR Right 05/25/13  . 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 TOTAL KNEE ARTHROPLASTY Right 01/19/2015   Procedure: RIGHT TOTAL KNEE ARTHROPLASTY;  Surgeon: Johnn Hai, MD;  Location: [facility];  Service: Orthopedics;  Laterality: Right;  . TUBAL LIGATION      Family History  Problem Relation Age  of Onset  . Hypertension Mother   . Hyperlipidemia Mother   . Heart disease Father        CAD; stent at age 30  . Diabetes Father   . Hypertension Father   . Hyperlipidemia Father   . Cancer Father        prostate  . Dementia Father   . Cancer Paternal Aunt        BREAST  . Breast cancer Paternal Aunt     Allergies  Allergen Reactions  . Shrimp [Shellfish Allergy] Itching    Rash and lips itching  . Ace Inhibitors     Cough  . Aspirin Nausea And Vomiting  . Azithromycin Rash    Current Outpatient Medications on File Prior to Visit  Medication Sig Dispense Refill  . albuterol (PROVENTIL HFA;VENTOLIN HFA) 108 (90 Base) MCG/ACT inhaler Inhale 2 puffs into the lungs every 6 (six) hours as needed for wheezing or shortness of breath. 1 Inhaler 0  . amLODipine (NORVASC) 5 MG tablet Take 1 tablet (5 mg total) by mouth daily. 90 tablet 0  . fluticasone (FLONASE) 50 MCG/ACT nasal spray USE 2 SPRAYS IN EACH NOSTRIL EVERY DAY 48 g 1  . gabapentin (NEURONTIN) 300 MG capsule Take 1 capsule by mouth daily in the morning, 1 at noon and 2 at bedtime. 360 capsule 1  . ibuprofen (ADVIL,MOTRIN) 600 MG tablet Take 1 tablet twice a day as needed with food.  0  . LINZESS 145 MCG CAPS capsule TAKE 1 CAPSULE (145 MCG TOTAL) BY MOUTH DAILY. 30 capsule 5  . methocarbamol (ROBAXIN) 500 MG tablet Take 500 mg by mouth 2 (two) times daily as needed.  2  . oxyCODONE-acetaminophen (PERCOCET) 7.5-325 MG tablet Take 1 tablet by mouth at bedtime as needed.  0  . pantoprazole (PROTONIX) 40 MG tablet Use previous signature 90 tablet 0  . potassium chloride SA (K-DUR,KLOR-CON) 20 MEQ tablet TAKE 1 TABLET (20 MEQ TOTAL) 2 (TWO) TIMES DAILY  180 tablet 1   No current facility-administered medications on file prior to visit.     BP 124/67 (BP Location: Right Arm, Patient Position: Sitting, Cuff Size: Small)   Pulse 77   Temp 98.7 F (37.1 C) (Oral)   Resp 16   Ht 5\' 6"  (1.676 m)   Wt 187 lb (84.8 kg)   LMP  11/25/1994   SpO2 100%   BMI 30.18 kg/m        Objective:   Physical Exam Constitutional:      Appearance: She is well-developed.  Neck:     Musculoskeletal: Neck supple.     Thyroid: Thyromegaly present.  Cardiovascular:     Rate and Rhythm: Normal rate and regular rhythm.     Heart sounds: Normal heart sounds. No murmur.  Pulmonary:     Effort: Pulmonary effort is normal. No respiratory distress.     Breath sounds: Normal breath sounds. No wheezing.  Skin:    General: Skin is warm and dry.  Neurological:     Mental Status: She is alert and oriented to person, place, and time.  Psychiatric:        Behavior: Behavior normal.        Thought Content: Thought content normal.        Judgment: Judgment normal.   Breast: left breast with some scarring and induration above the nipple at 1 oclock. Able to express some yellow foul smelling fluid from center of nipple        Assessment & Plan:  Allergic rhinitis- stable with antihistamine/flonase.  Hyperlipidemia- declines statin. She has been working on weight loss though which I commended her on.  Mastitis/mastalgia-has had previous surgery on this breast for recurrent infection/abscess. Will  rx with keflex. Obtain diagnostic mammo/US. Follow up in 2 weeks. If not improved refer back to surgeon.  HTN- bp stable. Continue current medication.  Multinodular goiter- we had referred her to endocrinology some time back and it appears that they called her but she never followed through with appointment. Check TFT and [place]. Likely will refer to endo after review of these results.

## 2019-01-28 ENCOUNTER — Telehealth: Payer: Self-pay | Admitting: Family

## 2019-01-28 NOTE — Telephone Encounter (Signed)
Potassium is a bit low. Please confirm that she is taking kdur bid and repeat bmet in 1 week, dx hypokalemia.

## 2019-01-29 NOTE — Telephone Encounter (Signed)
Lm for patient to call today for lab results

## 2019-01-29 NOTE — Telephone Encounter (Signed)
Ok

## 2019-01-29 NOTE — Telephone Encounter (Signed)
Talked to patient she reported she has not been taking the kdur. Advised patient to start taking the medication bid as indicated and she needs to repeat bmet in one week.  She will prefer to have bmet at her scheduled appointment on 02-08-19 if this is ok.

## 2019-01-30 ENCOUNTER — Ambulatory Visit (HOSPITAL_BASED_OUTPATIENT_CLINIC_OR_DEPARTMENT_OTHER)
Admission: RE | Admit: 2019-01-30 | Discharge: 2019-01-30 | Disposition: A | Discharge status: Home / Self Care | Payer: BLUE CROSS/BLUE SHIELD | Source: Ambulatory Visit | Attending: Family | Admitting: Family

## 2019-01-30 DIAGNOSIS — E042 Nontoxic multinodular goiter: Secondary | ICD-10-CM | POA: Diagnosis present

## 2019-02-01 ENCOUNTER — Telehealth: Payer: Self-pay | Admitting: Family

## 2019-02-01 DIAGNOSIS — E041 Nontoxic single thyroid nodule: Secondary | ICD-10-CM

## 2019-02-01 NOTE — Telephone Encounter (Signed)
Please let pt know that I reviewed her thyroid ultrasound. Notes enlarging nodule. I recommend biopsy of this nodule and referral to endocrinology. I have placed both orders.

## 2019-02-01 NOTE — Telephone Encounter (Signed)
Patient advised of results and referrals, she verbalized understanding.

## 2019-02-02 ENCOUNTER — Ambulatory Visit (HOSPITAL_BASED_OUTPATIENT_CLINIC_OR_DEPARTMENT_OTHER): Payer: Medicare Other

## 2019-02-08 ENCOUNTER — Ambulatory Visit (INDEPENDENT_AMBULATORY_CARE_PROVIDER_SITE_OTHER): Payer: BC Managed Care – PPO | Admitting: Family

## 2019-02-08 ENCOUNTER — Encounter: Payer: Self-pay | Admitting: Family

## 2019-02-08 ENCOUNTER — Ambulatory Visit: Payer: BLUE CROSS/BLUE SHIELD | Admitting: Family

## 2019-02-08 ENCOUNTER — Other Ambulatory Visit: Payer: Self-pay

## 2019-02-08 ENCOUNTER — Other Ambulatory Visit: Payer: Medicare Other

## 2019-02-08 VITALS — BP 134/68 | HR 91 | Temp 97.9°F | Resp 16 | Ht 66.0 in | Wt 189.0 lb

## 2019-02-08 DIAGNOSIS — R0989 Other specified symptoms and signs involving the circulatory and respiratory systems: Secondary | ICD-10-CM | POA: Diagnosis not present

## 2019-02-08 DIAGNOSIS — E876 Hypokalemia: Secondary | ICD-10-CM

## 2019-02-08 DIAGNOSIS — E041 Nontoxic single thyroid nodule: Secondary | ICD-10-CM

## 2019-02-08 LAB — BASIC METABOLIC PANEL
BUN: 16 mg/dL (ref 6–23)
CO2: 24 mEq/L (ref 19–32)
Calcium: 9.3 mg/dL (ref 8.4–10.5)
Chloride: 104 mEq/L (ref 96–112)
Creatinine, Ser: 0.83 mg/dL (ref 0.40–1.20)
GFR: 83.61 mL/min (ref >60.00)
Glucose, Bld: 112 mg/dL — ABNORMAL HIGH (ref 70–99)
Potassium: 4.2 mEq/L (ref 3.5–5.1)
Sodium: 138 mEq/L (ref 135–145)

## 2019-02-08 NOTE — Progress Notes (Signed)
Subjective:    Patient ID: Jade [name], female    DOB: Jul 25, 1956, 63 y.o.   MRN: [id number]  HPI  Patient is a 63 yr old female who presents today for follow up.  Hypokalemia- last potassium level on 3/3 was 3.4. reports that she has restarted potassium  Bid previously taking 25meq once daily instead of twice daily.   Thyroid nodule- biopsy has been ordered.   Reports some chest congestion since Friday.  She continues to smoke.  Having some nasal drainage and sneezing. Review of Systems Past Medical History:  Diagnosis Date  . Benign microscopic hematuria    neg cystoscopy 11/12- dr. Janice Norrie  . DJD (degenerative joint disease)    L KNEE  . Family history of anesthesia complication    multiple family members with history of this  . GERD (gastroesophageal reflux disease)   . Hyperlipidemia   . Hypertension   . Malignant hyperthermia   . Numbness and tingling in left arm      Social History   Socioeconomic History  . Marital status: Married    Spouse name: Not on file  . Number of children: 3  . Years of education: Not on file  . Highest education level: Not on file  Occupational History  . Not on file  Social Needs  . Financial resource strain: Not on file  . Food insecurity:    Worry: Not on file    Inability: Not on file  . Transportation needs:    Medical: Not on file    Non-medical: Not on file  Tobacco Use  . Smoking status: Current Every Day Smoker    Packs/day: 1.00    Years: 39.00    Pack years: 39.00    Types: Cigarettes  . Smokeless tobacco: Never Used  Substance and Sexual Activity  . Alcohol use: No    Alcohol/week: 0.0 standard drinks  . Drug use: No  . Sexual activity: Yes    Partners: Male  Lifestyle  . Physical activity:    Days per week: Not on file    Minutes per session: Not on file  . Stress: Not on file  Relationships  . Social connections:    Talks on phone: Not on file    Gets together: Not on file    Attends religious  service: Not on file    Active member of club or organization: Not on file    Attends meetings of clubs or organizations: Not on file    Relationship status: Not on file  . Intimate partner violence:    Fear of current or ex partner: Not on file    Emotionally abused: Not on file    Physically abused: Not on file    Forced sexual activity: Not on file  Other Topics Concern  . Not on file  Social History Narrative   Regular exercise:  No   Caffeine Use:  2 cups coffee and 3-4 glasses of soda daily.   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    Past Surgical History:  Procedure Laterality Date  . ABDOMINAL HYSTERECTOMY  1996  . BREAST EXCISIONAL BIOPSY Left 01/2016  . BREAST EXCISIONAL BIOPSY Left 03/2016  . CERVICAL DISC SURGERY  2013   Dr. Arnoldo Morale  . DILATION AND CURETTAGE OF UTERUS    . KNEE CARTILAGE SURGERY  1999   right knee  . ROTATOR CUFF REPAIR Right  05/25/13  . 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 TOTAL KNEE ARTHROPLASTY Right 01/19/2015   Procedure: RIGHT TOTAL KNEE ARTHROPLASTY;  Surgeon: Johnn Hai, MD;  Location: [facility];  Service: Orthopedics;  Laterality: Right;  . TUBAL LIGATION      Family History  Problem Relation Age of Onset  . Hypertension Mother   . Hyperlipidemia Mother   . Heart disease Father        CAD; stent at age 5  . Diabetes Father   . Hypertension Father   . Hyperlipidemia Father   . Cancer Father        prostate  . Dementia Father   . Cancer Paternal Aunt        BREAST  . Breast cancer Paternal Aunt     Allergies  Allergen Reactions  . Shrimp [Shellfish Allergy] Itching    Rash and lips itching  . Ace Inhibitors     Cough  . Aspirin Nausea And Vomiting  . Azithromycin Rash    Current Outpatient Medications on File Prior to  Visit  Medication Sig Dispense Refill  . albuterol (PROVENTIL HFA;VENTOLIN HFA) 108 (90 Base) MCG/ACT inhaler Inhale 2 puffs into the lungs every 6 (six) hours as needed for wheezing or shortness of breath. 1 Inhaler 0  . amLODipine (NORVASC) 5 MG tablet Take 1 tablet (5 mg total) by mouth daily. 90 tablet 0  . fluticasone (FLONASE) 50 MCG/ACT nasal spray USE 2 SPRAYS IN EACH NOSTRIL EVERY DAY 48 g 1  . gabapentin (NEURONTIN) 300 MG capsule Take 1 capsule by mouth daily in the morning, 1 at noon and 2 at bedtime. 360 capsule 1  . ibuprofen (ADVIL,MOTRIN) 600 MG tablet Take 1 tablet twice a day as needed with food.  0  . LINZESS 145 MCG CAPS capsule TAKE 1 CAPSULE (145 MCG TOTAL) BY MOUTH DAILY. 30 capsule 5  . methocarbamol (ROBAXIN) 500 MG tablet Take 500 mg by mouth 2 (two) times daily as needed.  2  . oxyCODONE-acetaminophen (PERCOCET) 7.5-325 MG tablet Take 1 tablet by mouth at bedtime as needed.  0  . pantoprazole (PROTONIX) 40 MG tablet Use previous signature 90 tablet 0  . potassium chloride SA (K-DUR,KLOR-CON) 20 MEQ tablet TAKE 1 TABLET (20 MEQ TOTAL) 2 (TWO) TIMES DAILY  180 tablet 1   No current facility-administered medications on file prior to visit.     BP 134/68 (BP Location: Right Arm, Patient Position: Sitting, Cuff Size: Small)   Pulse 91   Temp 97.9 F (36.6 C) (Oral)   Resp 16   Ht 5\' 6"  (1.676 m)   Wt 189 lb (85.7 kg)   LMP 11/25/1994   SpO2 100%   BMI 30.51 kg/m       Objective:   Physical Exam Constitutional:      Appearance: She is well-developed.  Neck:     Musculoskeletal: Neck supple.     Thyroid: No thyromegaly.  Cardiovascular:     Rate and Rhythm: Normal rate and regular rhythm.     Heart sounds: Normal heart sounds. No murmur.  Pulmonary:     Effort: Pulmonary effort is normal. No respiratory distress.     Breath sounds: Normal breath sounds. No wheezing.  Skin:    General: Skin is warm and dry.  Neurological:     Mental Status: She is  alert and oriented to person, place, and time.  Psychiatric:        Behavior: Behavior normal.        Thought Content: Thought content normal.        Judgment: Judgment normal.           Assessment & Plan:  Chest congestion- likely secondary to seasonal allergies.  Pt is advised as follows: Continue zyrtec and flonase.  Add mucinex 600mg  twice daily as needed for chest congestion. Call if fever, shortness of breath, worsening cough, or if not improved in 3-4 days.  Hypokalemia- obtain follow up bmet. Continue kdur.  Thyroid nodule- biopsy has been scheduled form 02/23/19, await biopsy results.

## 2019-02-08 NOTE — Patient Instructions (Signed)
Continue zyrtec and flonase.  Add mucinex 600mg  twice daily as needed for chest congestion. Call if fever, shortness of breath, worsening cough, or if not improved in 3-4 days. Complete lab work prior to leaving.

## 2019-02-10 NOTE — Progress Notes (Signed)
Letter mailed to patient.

## 2019-02-23 ENCOUNTER — Other Ambulatory Visit: Payer: Medicare Other

## 2019-02-24 ENCOUNTER — Telehealth: Payer: Self-pay | Admitting: Family

## 2019-02-24 DIAGNOSIS — E042 Nontoxic multinodular goiter: Secondary | ICD-10-CM

## 2019-02-24 NOTE — Telephone Encounter (Signed)
Could you please contact patient and ask her if she was ever contacted about scheduling her thyroid biopsy?  If this is not already scheduled, then we need to replace referral.  (I see she has a procedure scheduled at the breast center which is separate). I have pended the order for thyroid biopsy.  We will also need to call interventional radiology at [facility] to make sure that it gets scheduled please.

## 2019-02-25 NOTE — Telephone Encounter (Signed)
Per patient, Interventional R. Has moved the appointment for bx of thyroid 2 times. It is now on schedule for June 2nd.

## 2019-03-01 ENCOUNTER — Ambulatory Visit: Payer: Medicare Other

## 2019-03-01 ENCOUNTER — Other Ambulatory Visit: Payer: Self-pay

## 2019-03-01 ENCOUNTER — Ambulatory Visit
Admission: RE | Admit: 2019-03-01 | Discharge: 2019-03-01 | Disposition: A | Discharge status: Home / Self Care | Payer: Medicare Other | Source: Ambulatory Visit | Attending: Family | Admitting: Family

## 2019-03-01 DIAGNOSIS — N644 Mastodynia: Secondary | ICD-10-CM

## 2019-03-08 ENCOUNTER — Encounter: Payer: Self-pay | Admitting: Internal Medicine

## 2019-03-17 ENCOUNTER — Other Ambulatory Visit: Payer: Medicare Other

## 2019-04-13 ENCOUNTER — Other Ambulatory Visit: Payer: Self-pay | Admitting: Family

## 2019-04-13 MED ORDER — GABAPENTIN 300 MG PO CAPS: ORAL_CAPSULE | ORAL | 1 refills | Status: DC

## 2019-04-13 MED FILL — GABAPENTIN 300 MG CAPSULE: 300 | 90 days supply | Qty: 360 | Fill #0

## 2019-04-13 NOTE — Telephone Encounter (Signed)
Copied from [place] [id number]. Topic: Quick Communication - Rx Refill/Question >> Apr 13, 2019 11:29 AM Rainey Pines A wrote: Medication: gabapentin (NEURONTIN) 300 MG capsule,fluticasone (FLONASE) 50 MCG/ACT nasal spray   Has the patient contacted their pharmacy? Yes (Agent: If no, request that the patient contact the pharmacy for the refill.) (Agent: If yes, when and what did the pharmacy advise?)Contact PCP  Preferred Pharmacy (with phone number or street name): [place], [place] - [place] [phone] (Phone) [phone] (Fax)    Agent: Please be advised that RX refills may take up to 3 business days. We ask that you follow-up with your pharmacy.

## 2019-04-13 NOTE — Telephone Encounter (Signed)
Rx sent 

## 2019-04-13 NOTE — Telephone Encounter (Signed)
Copied from [place] [id number]. Topic: Quick Communication - Rx Refill/Question >> Apr 13, 2019 11:29 AM Rainey Pines A wrote: Medication: gabapentin (NEURONTIN) 300 MG capsule,fluticasone (FLONASE) 50 MCG/ACT nasal spray   Has the patient contacted their pharmacy? Yes (Agent: If no, request that the patient contact the pharmacy for the refill.) (Agent: If yes, when and what did the pharmacy advise?)Contact PCP  Preferred Pharmacy (with phone number or street name): [place], [place] - [place] [phone] (Phone) [phone] (Fax)    Agent: Please be advised that RX refills may take up to 3 business days. We ask that you follow-up with your pharmacy.

## 2019-04-20 ENCOUNTER — Other Ambulatory Visit: Payer: Self-pay | Admitting: Family

## 2019-04-20 MED FILL — AMLODIPINE BESYLATE 5 MG TA: 5 | 90 days supply | Qty: 90 | Fill #1

## 2019-04-21 MED FILL — PANTOPRAZOLE SOD DR 40 MG T: 40 | 90 days supply | Qty: 90 | Fill #0

## 2019-04-27 ENCOUNTER — Other Ambulatory Visit (HOSPITAL_COMMUNITY)
Admission: RE | Admit: 2019-04-27 | Discharge: 2019-04-27 | Disposition: A | Discharge status: Home / Self Care | Payer: Medicare Other | Source: Ambulatory Visit | Attending: Student | Admitting: Student

## 2019-04-27 ENCOUNTER — Ambulatory Visit
Admission: RE | Admit: 2019-04-27 | Discharge: 2019-04-27 | Disposition: A | Discharge status: Home / Self Care | Payer: Medicare Other | Source: Ambulatory Visit | Attending: Family | Admitting: Family

## 2019-04-27 DIAGNOSIS — E041 Nontoxic single thyroid nodule: Secondary | ICD-10-CM

## 2019-04-30 ENCOUNTER — Encounter: Payer: Self-pay | Admitting: Family

## 2019-04-30 ENCOUNTER — Telehealth: Payer: Self-pay | Admitting: Family

## 2019-04-30 ENCOUNTER — Ambulatory Visit (INDEPENDENT_AMBULATORY_CARE_PROVIDER_SITE_OTHER): Payer: 59 | Admitting: Family

## 2019-04-30 DIAGNOSIS — N611 Abscess of the breast and nipple: Secondary | ICD-10-CM | POA: Diagnosis not present

## 2019-04-30 DIAGNOSIS — D34 Benign neoplasm of thyroid gland: Secondary | ICD-10-CM | POA: Diagnosis not present

## 2019-04-30 MED ORDER — AMOXICILLIN-POT CLAVULANATE 875-125 MG PO TABS: 1.0000 | ORAL_TABLET | Freq: Two times a day (BID) | ORAL | 0 refills | Status: DC

## 2019-04-30 MED FILL — AMOX-CLAV 875-125 MG TABLET: 875-125 | 10 days supply | Qty: 20 | Fill #0

## 2019-04-30 NOTE — Telephone Encounter (Signed)
Opened in error

## 2019-04-30 NOTE — Progress Notes (Signed)
Virtual Visit via Video Note  I connected with Jade [name] on 04/30/19 at 11:20 AM EDT by a video enabled telemedicine application and verified that I am speaking with the correct person using two identifiers. This visit type was conducted due to national recommendations for restrictions regarding the COVID-19 Pandemic (e.g. social distancing).  This format is felt to be most appropriate for this patient at this time.   I discussed the limitations of evaluation and management by telemedicine and the availability of in person appointments. The patient expressed understanding and agreed to proceed.  Only the patient and myself were on today's video visit. The patient was at home and I was in my office at the time of today's visit.   History of Present Illness:  Patient is a 63 yr old female who presents today for follow up of her thyroid biopsy as well as complaint of breast abscess.  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  She has followed with Dr. Genoveva Ill breast surgeon in [facility] previously.  She last saw him back in July 2018.  Back in April 2017 she had a biopsy at the base of the left nipple.  No malignancy was identified.  She also had a central duct excision on the left with lumpectomy.  Pathology showed benign breast tissue showing mastitis.   She reports that 3 days ago she developed some tenderness in the left breast.  She has also developed associated left nipple discharge which is foul-smelling.  Thyroid biopsy- pt underwent FNA biopsy on 6/2 for a left thyroid nodule.  Cytology noted Hurthle cell.  Past Medical History:  Diagnosis Date  . Benign microscopic hematuria    neg cystoscopy 11/12- dr. Janice Norrie  . DJD (degenerative joint disease)    L KNEE  . Family history of anesthesia complication    multiple family members with history of this  . GERD (gastroesophageal reflux disease)   . Hyperlipidemia   . Hypertension   . Malignant hyperthermia   . Numbness and  tingling in left arm      Social History   Socioeconomic History  . Marital status: Married    Spouse name: Not on file  . Number of children: 3  . Years of education: Not on file  . Highest education level: Not on file  Occupational History  . Not on file  Social Needs  . Financial resource strain: Not on file  . Food insecurity:    Worry: Not on file    Inability: Not on file  . Transportation needs:    Medical: Not on file    Non-medical: Not on file  Tobacco Use  . Smoking status: Current Every Day Smoker    Packs/day: 1.00    Years: 39.00    Pack years: 39.00    Types: Cigarettes  . Smokeless tobacco: Never Used  Substance and Sexual Activity  . Alcohol use: No    Alcohol/week: 0.0 standard drinks  . Drug use: No  . Sexual activity: Yes    Partners: Male  Lifestyle  . Physical activity:    Days per week: Not on file    Minutes per session: Not on file  . Stress: Not on file  Relationships  . Social connections:    Talks on phone: Not on file    Gets together: Not on file    Attends religious service: Not on file    Active member of club or organization: Not on file  Attends meetings of clubs or organizations: Not on file    Relationship status: Not on file  . Intimate partner violence:    Fear of current or ex partner: Not on file    Emotionally abused: Not on file    Physically abused: Not on file    Forced sexual activity: Not on file  Other Topics Concern  . Not on file  Social History Narrative   Regular exercise:  No   Caffeine Use:  2 cups coffee and 3-4 glasses of soda daily.   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    Past Surgical History:  Procedure Laterality Date  . ABDOMINAL HYSTERECTOMY  1996  . BREAST EXCISIONAL BIOPSY Left 01/2016  . BREAST EXCISIONAL BIOPSY Left 03/2016  . CERVICAL DISC SURGERY  2013   Dr. Arnoldo Morale  . DILATION AND CURETTAGE OF UTERUS    . KNEE  CARTILAGE SURGERY  1999   right knee  . ROTATOR CUFF REPAIR Right 05/25/13  . 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 TOTAL KNEE ARTHROPLASTY Right 01/19/2015   Procedure: RIGHT TOTAL KNEE ARTHROPLASTY;  Surgeon: Johnn Hai, MD;  Location: [facility];  Service: Orthopedics;  Laterality: Right;  . TUBAL LIGATION      Family History  Problem Relation Age of Onset  . Hypertension Mother   . Hyperlipidemia Mother   . Heart disease Father        CAD; stent at age 66  . Diabetes Father   . Hypertension Father   . Hyperlipidemia Father   . Cancer Father        prostate  . Dementia Father   . Cancer Paternal Aunt        BREAST  . Breast cancer Paternal Aunt     Allergies  Allergen Reactions  . Shrimp [Shellfish Allergy] Itching    Rash and lips itching  . Ace Inhibitors     Cough  . Aspirin Nausea And Vomiting  . Azithromycin Rash    Current Outpatient Medications on File Prior to Visit  Medication Sig Dispense Refill  . albuterol (PROVENTIL HFA;VENTOLIN HFA) 108 (90 Base) MCG/ACT inhaler Inhale 2 puffs into the lungs every 6 (six) hours as needed for wheezing or shortness of breath. 1 Inhaler 0  . amLODipine (NORVASC) 5 MG tablet Take 1 tablet (5 mg total) by mouth daily. 90 tablet 0  . fluticasone (FLONASE) 50 MCG/ACT nasal spray USE 2 SPRAYS IN EACH NOSTRIL EVERY DAY 48 g 1  . gabapentin (NEURONTIN) 300 MG capsule Take 1 capsule by mouth daily in the morning, 1 at noon and 2 at bedtime. 360 capsule 1  . ibuprofen (ADVIL,MOTRIN) 600 MG tablet Take 1 tablet twice a day as needed with food.  0  . LINZESS 145 MCG CAPS capsule TAKE 1 CAPSULE (145 MCG TOTAL) BY MOUTH DAILY. 30 capsule 5  . methocarbamol (ROBAXIN) 500 MG tablet Take 500 mg by mouth 2 (two) times daily as needed.  2  . oxyCODONE-acetaminophen (PERCOCET) 7.5-325 MG tablet Take 1 tablet  by mouth at bedtime as needed.  0  . pantoprazole (PROTONIX) 40 MG tablet TAKE 1 TABLET BY MOUTH ONCE DAILY 90 tablet 1  . potassium chloride SA (K-DUR,KLOR-CON) 20 MEQ tablet TAKE 1 TABLET (20 MEQ TOTAL) 2 (  TWO) TIMES DAILY  180 tablet 1   No current facility-administered medications on file prior to visit.     LMP 11/25/1994     Observations/Objective:   Gen: Awake, alert, no acute distress Resp: Breathing is even and non-labored Psych: calm/pleasant demeanor Neuro: Alert and Oriented x 3, + facial symmetry, speech is clear. Breast: White milky discharge is noted from the left nipple.  Minimal erythema is noted surrounding her left nipple.  Subjective tenderness to palpation.  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  I have discussed these findings with the patient today as well as plan for referral to Dr. Harlow Asa for surgical consultation.  Recurrent left-sided breast abscess- will initiate Augmentin.  I will also place a referral for her to follow-up with Dr. Genoveva Ill given the recurrent nature of this infection.  Follow Up Instructions:    I discussed the assessment and treatment plan with the patient. The patient was provided an opportunity to ask questions and all were answered. The patient agreed with the plan and demonstrated an understanding of the instructions.   The patient was advised to call back or seek an in-person evaluation if the symptoms worsen or if the condition fails to improve as anticipated.    Nance Pear, NP

## 2019-05-03 ENCOUNTER — Telehealth: Payer: Self-pay

## 2019-05-03 NOTE — Telephone Encounter (Signed)
Copied from [place] [id number]. Topic: General - Other >> May 03, 2019 10:58 AM Celene Kras A wrote: Reason for CRM: Elisa, from [facility] surgical specialty, called stating she needs more information regarding the referral sent for the pt. Please advise.  Callback # [phone]

## 2019-05-13 NOTE — Telephone Encounter (Signed)
Talked to [place] at [facility], referral is completed and patient has appointment

## 2019-05-21 ENCOUNTER — Ambulatory Visit (INDEPENDENT_AMBULATORY_CARE_PROVIDER_SITE_OTHER): Payer: 59 | Admitting: Family

## 2019-05-21 ENCOUNTER — Telehealth: Payer: Self-pay | Admitting: Family

## 2019-05-21 DIAGNOSIS — R0602 Shortness of breath: Secondary | ICD-10-CM

## 2019-05-21 DIAGNOSIS — J309 Allergic rhinitis, unspecified: Secondary | ICD-10-CM | POA: Diagnosis not present

## 2019-05-21 MED ORDER — ALBUTEROL SULFATE HFA 108 (90 BASE) MCG/ACT IN AERS: 2.0000 | INHALATION_SPRAY | Freq: Four times a day (QID) | RESPIRATORY_TRACT | 2 refills | Status: DC | PRN

## 2019-05-21 MED ORDER — PREDNISONE 10 MG PO TABS: ORAL_TABLET | ORAL | 0 refills | Status: DC

## 2019-05-21 MED ORDER — LEVOFLOXACIN 500 MG PO TABS: 500.0000 mg | ORAL_TABLET | Freq: Every day | ORAL | 0 refills | Status: DC

## 2019-05-21 NOTE — Telephone Encounter (Signed)
Patient with cough and sob. Needs COVID-19 testing please.

## 2019-05-21 NOTE — Telephone Encounter (Signed)
Please call Cytology at [facility] and request copy of AFFIRMA results from her thyroid biopsy performed in June. Let me know when you get these results please or if they are still pending.

## 2019-05-21 NOTE — Progress Notes (Signed)
Virtual Visit via Video Note  I connected with Jade [name] on 05/21/19 at  2:20 PM EDT by a video enabled telemedicine application and verified that I am speaking with the correct person using two identifiers.  Location: Patient: home Provider: office   I discussed the limitations of evaluation and management by telemedicine and the availability of in person appointments. The patient expressed understanding and agreed to proceed.  History of Present Illness:  Patient reports that last Friday she started "feeling funny". Was having belching.  Reports that she also vomited x 1 that day.  Reports + hoarseness, chest congestion. Tried mucinex which is not helping. Reports that last time this occurred she went to the urgent care and was treated with abx, albuterol, steroids.  Feels the same now. Reports that she has a headache but denies sore throat. Thinks that this is due to sinus. + rhinorrea. Has been working at [employer].  Reports that she wears a mask now but many patrons do not.  She reports some SOB which is about the same as the last time she had pneumonia.   Past Medical History:  Diagnosis Date  . Benign microscopic hematuria    neg cystoscopy 11/12- dr. Janice Norrie  . DJD (degenerative joint disease)    L KNEE  . Family history of anesthesia complication    multiple family members with history of this  . GERD (gastroesophageal reflux disease)   . Hurthle cell neoplasm of thyroid   . Hyperlipidemia   . Hypertension   . Malignant hyperthermia   . Numbness and tingling in left arm      Social History   Socioeconomic History  . Marital status: Married    Spouse name: Not on file  . Number of children: 3  . Years of education: Not on file  . Highest education level: Not on file  Occupational History  . Not on file  Social Needs  . Financial resource strain: Not on file  . Food insecurity    Worry: Not on file    Inability: Not on file  . Transportation needs     Medical: Not on file    Non-medical: Not on file  Tobacco Use  . Smoking status: Current Every Day Smoker    Packs/day: 1.00    Years: 39.00    Pack years: 39.00    Types: Cigarettes  . Smokeless tobacco: Never Used  Substance and Sexual Activity  . Alcohol use: No    Alcohol/week: 0.0 standard drinks  . Drug use: No  . Sexual activity: Yes    Partners: Male  Lifestyle  . Physical activity    Days per week: Not on file    Minutes per session: Not on file  . Stress: Not on file  Relationships  . Social Herbalist on phone: Not on file    Gets together: Not on file    Attends religious service: Not on file    Active member of club or organization: Not on file    Attends meetings of clubs or organizations: Not on file    Relationship status: Not on file  . Intimate partner violence    Fear of current or ex partner: Not on file    Emotionally abused: Not on file    Physically abused: Not on file    Forced sexual activity: Not on file  Other Topics Concern  . Not on file  Social History Narrative   Regular  exercise:  No   Caffeine Use:  2 cups coffee and 3-4 glasses of soda daily.   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    Past Surgical History:  Procedure Laterality Date  . ABDOMINAL HYSTERECTOMY  1996  . BREAST EXCISIONAL BIOPSY Left 01/2016  . BREAST EXCISIONAL BIOPSY Left 03/2016  . CERVICAL DISC SURGERY  2013   Dr. Arnoldo Morale  . DILATION AND CURETTAGE OF UTERUS    . KNEE CARTILAGE SURGERY  1999   right knee  . ROTATOR CUFF REPAIR Right 05/25/13  . 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 TOTAL KNEE ARTHROPLASTY Right 01/19/2015   Procedure: RIGHT TOTAL KNEE ARTHROPLASTY;  Surgeon: Johnn Hai, MD;  Location: [facility];  Service: Orthopedics;  Laterality: Right;   . TUBAL LIGATION      Family History  Problem Relation Age of Onset  . Hypertension Mother   . Hyperlipidemia Mother   . Heart disease Father        CAD; stent at age 63  . Diabetes Father   . Hypertension Father   . Hyperlipidemia Father   . Cancer Father        prostate  . Dementia Father   . Cancer Paternal Aunt        BREAST  . Breast cancer Paternal Aunt     Allergies  Allergen Reactions  . Shrimp [Shellfish Allergy] Itching    Rash and lips itching  . Ace Inhibitors     Cough  . Aspirin Nausea And Vomiting  . Azithromycin Rash    Current Outpatient Medications on File Prior to Visit  Medication Sig Dispense Refill  . albuterol (PROVENTIL HFA;VENTOLIN HFA) 108 (90 Base) MCG/ACT inhaler Inhale 2 puffs into the lungs every 6 (six) hours as needed for wheezing or shortness of breath. 1 Inhaler 0  . amLODipine (NORVASC) 5 MG tablet Take 1 tablet (5 mg total) by mouth daily. 90 tablet 0  . amoxicillin-clavulanate (AUGMENTIN) 875-125 MG tablet Take 1 tablet by mouth 2 (two) times daily. 20 tablet 0  . fluticasone (FLONASE) 50 MCG/ACT nasal spray USE 2 SPRAYS IN EACH NOSTRIL EVERY DAY 48 g 1  . gabapentin (NEURONTIN) 300 MG capsule Take 1 capsule by mouth daily in the morning, 1 at noon and 2 at bedtime. 360 capsule 1  . ibuprofen (ADVIL,MOTRIN) 600 MG tablet Take 1 tablet twice a day as needed with food.  0  . LINZESS 145 MCG CAPS capsule TAKE 1 CAPSULE (145 MCG TOTAL) BY MOUTH DAILY. 30 capsule 5  . methocarbamol (ROBAXIN) 500 MG tablet Take 500 mg by mouth 2 (two) times daily as needed.  2  . oxyCODONE-acetaminophen (PERCOCET) 7.5-325 MG tablet Take 1 tablet by mouth at bedtime as needed.  0  . pantoprazole (PROTONIX) 40 MG tablet TAKE 1 TABLET BY MOUTH ONCE DAILY 90 tablet 1  . potassium chloride SA (K-DUR,KLOR-CON) 20 MEQ tablet TAKE 1 TABLET (20 MEQ TOTAL) 2 (TWO) TIMES DAILY  180 tablet 1   No current facility-administered medications on file prior to visit.      LMP 11/25/1994      Observations/Objective:   Gen: Awake, alert, no acute distress Resp: Breathing is even and non-labored Psych: calm/pleasant demeanor Neuro: Alert and Oriented x 3, + facial symmetry,  speech is clear.   Assessment and Plan:  SOB- Patient is having URI symptoms as well. Will plan to send her for COVID-19 testing. She is instructed to call if new/worsening symptoms or if symptoms fail to improve. She is advised to quarantine pending review of COVID-19 results.   Follow Up Instructions:    I discussed the assessment and treatment plan with the patient. The patient was provided an opportunity to ask questions and all were answered. The patient agreed with the plan and demonstrated an understanding of the instructions.   The patient was advised to call back or seek an in-person evaluation if the symptoms worsen or if the condition fails to improve as anticipated.  Nance Pear, NP

## 2019-05-23 ENCOUNTER — Telehealth: Payer: Self-pay

## 2019-05-23 NOTE — Telephone Encounter (Signed)
Called pt and LM on VM to call back to schedule appt. [phone].

## 2019-05-23 NOTE — Telephone Encounter (Signed)
Called pt and LM on VM to call back to make appt-  

## 2019-05-24 ENCOUNTER — Ambulatory Visit: Payer: Medicare Other | Admitting: Family

## 2019-05-24 ENCOUNTER — Encounter: Payer: Self-pay | Admitting: Family

## 2019-05-24 ENCOUNTER — Telehealth: Payer: Self-pay | Admitting: *Deleted

## 2019-05-24 ENCOUNTER — Encounter (HOSPITAL_COMMUNITY): Payer: Self-pay

## 2019-05-24 DIAGNOSIS — Z20822 Contact with and (suspected) exposure to covid-19: Secondary | ICD-10-CM

## 2019-05-24 NOTE — Telephone Encounter (Signed)
Pt returned call to schedule for the covid-19 test. Pt is scheduled for tomorrow per pt request at 8:45 at the [facility]. She is advised to wear a mask,stay in car with windows rolled up until ready to be tested. This is a drive thru testing site. Pt voiced understanding.

## 2019-05-24 NOTE — Telephone Encounter (Signed)
I have received the paperwork. Thanks.

## 2019-05-24 NOTE — Telephone Encounter (Signed)
This encounter was created in error - please disregard.

## 2019-05-24 NOTE — Progress Notes (Signed)
duplicate

## 2019-05-24 NOTE — Telephone Encounter (Signed)
Please call pt this am and schedule her for a virtual visit with me.

## 2019-05-24 NOTE — Progress Notes (Signed)
    Erroneous encounter.  Patient was given number to call back rn to schedule COVID-19 testing.

## 2019-05-24 NOTE — Telephone Encounter (Signed)
Patient scheduled for 9:20 today

## 2019-05-25 ENCOUNTER — Other Ambulatory Visit: Payer: Medicare Other

## 2019-05-25 DIAGNOSIS — Z20822 Contact with and (suspected) exposure to covid-19: Secondary | ICD-10-CM

## 2019-05-31 LAB — NOVEL CORONAVIRUS, NAA: SARS-CoV-2, NAA: NOT DETECTED

## 2019-06-04 ENCOUNTER — Telehealth: Payer: Self-pay | Admitting: *Deleted

## 2019-06-04 NOTE — Telephone Encounter (Signed)
Results faxed to below #. Notified pt.  Copied from [place] [id number]. Topic: General - Other >> Jun 04, 2019 11:16 AM Burchel, Abbi R wrote: Reason for CRM:  Pt requesting a copy of her COVID results be faxed to her employer bc she does not use myChart.  Please fax to:  [facility] [phone] Attn: Ria Comment

## 2019-06-07 ENCOUNTER — Telehealth: Payer: Self-pay

## 2019-06-07 NOTE — Telephone Encounter (Signed)
Copied from [place] [id number]. Topic: General - Other >> Jun 07, 2019  8:42 AM Rainey Pines A wrote: Patient needs a note stated that she can return back to work because her covid test results were negative. Note can be faxed to her employer at [phone]

## 2019-06-08 NOTE — Telephone Encounter (Signed)
Letter printed and faxed to number provided.

## 2019-06-08 NOTE — Telephone Encounter (Signed)
Please fax attached letter to her employer.  See number provided by patient.

## 2019-07-19 ENCOUNTER — Other Ambulatory Visit: Payer: Self-pay | Admitting: Family

## 2019-07-19 MED FILL — AMLODIPINE BESYLATE 5 MG TA: 5 | 90 days supply | Qty: 90 | Fill #0

## 2019-07-19 MED FILL — GABAPENTIN 300 MG CAPSULE: 300 | 90 days supply | Qty: 360 | Fill #1

## 2019-07-19 MED FILL — PANTOPRAZOLE SOD DR 40 MG T: 40 | 90 days supply | Qty: 90 | Fill #1

## 2019-10-12 ENCOUNTER — Telehealth: Payer: Self-pay | Admitting: Family

## 2019-10-12 NOTE — Telephone Encounter (Signed)
Was scheduled for follow up of GERD tomorrow

## 2019-10-12 NOTE — Telephone Encounter (Signed)
She will need office visit first please so I can better understand how to help her.

## 2019-10-12 NOTE — Telephone Encounter (Signed)
Pt called in and stated that her pantoprazole (PROTONIX) 40 MG tablet [id number]  Is not working and would let to try something else?   Pharm[facility] - [facility]

## 2019-10-12 NOTE — Telephone Encounter (Signed)
Please advise 

## 2019-10-13 ENCOUNTER — Other Ambulatory Visit: Payer: Self-pay

## 2019-10-13 ENCOUNTER — Ambulatory Visit (INDEPENDENT_AMBULATORY_CARE_PROVIDER_SITE_OTHER): Payer: Medicare Other | Admitting: Family

## 2019-10-13 ENCOUNTER — Encounter: Payer: Self-pay | Admitting: Gastroenterology

## 2019-10-13 ENCOUNTER — Encounter: Payer: Self-pay | Admitting: Family

## 2019-10-13 VITALS — BP 122/79 | HR 79 | Temp 96.2°F | Resp 16 | Ht 66.0 in | Wt 190.0 lb

## 2019-10-13 DIAGNOSIS — Z23 Encounter for immunization: Secondary | ICD-10-CM | POA: Diagnosis not present

## 2019-10-13 DIAGNOSIS — K219 Gastro-esophageal reflux disease without esophagitis: Secondary | ICD-10-CM

## 2019-10-13 DIAGNOSIS — R1310 Dysphagia, unspecified: Secondary | ICD-10-CM | POA: Diagnosis not present

## 2019-10-13 MED ORDER — OXYCODONE-ACETAMINOPHEN 7.5-325 MG PO TABS: 1.0000 | ORAL_TABLET | Freq: Every evening | ORAL | 0 refills | Status: DC | PRN

## 2019-10-13 MED ORDER — PANTOPRAZOLE SODIUM 40 MG PO TBEC: 40.0000 mg | DELAYED_RELEASE_TABLET | Freq: Two times a day (BID) | ORAL | 2 refills | Status: DC

## 2019-10-13 MED ORDER — METHOCARBAMOL 500 MG PO TABS: 500.0000 mg | ORAL_TABLET | Freq: Two times a day (BID) | ORAL | 2 refills | Status: DC | PRN

## 2019-10-13 MED FILL — PANTOPRAZOLE SOD DR 40 MG T: 40 | 30 days supply | Qty: 60 | Fill #0

## 2019-10-13 NOTE — Patient Instructions (Addendum)
Please try to eat at least 2 hours before you lay down to go to bed.   Avoid fried foods.    Gastroesophageal Reflux Disease, Adult Gastroesophageal reflux (GER) happens when acid from the stomach flows up into the tube that connects the mouth and the stomach (esophagus). Normally, food travels down the esophagus and stays in the stomach to be digested. With GER, food and stomach acid sometimes move back up into the esophagus. You may have a disease called gastroesophageal reflux disease (GERD) if the reflux:  Happens often.  Causes frequent or very bad symptoms.  Causes problems such as damage to the esophagus. When this happens, the esophagus becomes sore and swollen (inflamed). Over time, GERD can make small holes (ulcers) in the lining of the esophagus. What are the causes? This condition is caused by a problem with the muscle between the esophagus and the stomach. When this muscle is weak or not normal, it does not close properly to keep food and acid from coming back up from the stomach. The muscle can be weak because of:  Tobacco use.  Pregnancy.  Having a certain type of hernia (hiatal hernia).  Alcohol use.  Certain foods and drinks, such as coffee, chocolate, onions, and peppermint. What increases the risk? You are more likely to develop this condition if you:  Are overweight.  Have a disease that affects your connective tissue.  Use NSAID medicines. What are the signs or symptoms? Symptoms of this condition include:  Heartburn.  Difficult or painful swallowing.  The feeling of having a lump in the throat.  A bitter taste in the mouth.  Bad breath.  Having a lot of saliva.  Having an upset or bloated stomach.  Belching.  Chest pain. Different conditions can cause chest pain. Make sure you see your doctor if you have chest pain.  Shortness of breath or noisy breathing (wheezing).  Ongoing (chronic) cough or a cough at night.  Wearing away of the  surface of teeth (tooth enamel).  Weight loss. How is this treated? Treatment will depend on how bad your symptoms are. Your doctor may suggest:  Changes to your diet.  Medicine.  Surgery. Follow these instructions at home: Eating and drinking   Follow a diet as told by your doctor. You may need to avoid foods and drinks such as: ? Coffee and tea (with or without caffeine). ? Drinks that contain alcohol. ? Energy drinks and sports drinks. ? Bubbly (carbonated) drinks or sodas. ? Chocolate and cocoa. ? Peppermint and mint flavorings. ? Garlic and onions. ? Horseradish. ? Spicy and acidic foods. These include peppers, chili powder, curry powder, vinegar, hot sauces, and BBQ sauce. ? Citrus fruit juices and citrus fruits, such as oranges, lemons, and limes. ? Tomato-based foods. These include red sauce, chili, salsa, and pizza with red sauce. ? Fried and fatty foods. These include donuts, french fries, potato chips, and high-fat dressings. ? High-fat meats. These include hot dogs, rib eye steak, sausage, ham, and bacon. ? High-fat dairy items, such as whole milk, butter, and cream cheese.  Eat small meals often. Avoid eating large meals.  Avoid drinking large amounts of liquid with your meals.  Avoid eating meals during the 2-3 hours before bedtime.  Avoid lying down right after you eat.  Do not exercise right after you eat. Lifestyle   Do not use any products that contain nicotine or tobacco. These include cigarettes, e-cigarettes, and chewing tobacco. If you need help quitting, ask your  doctor.  Try to lower your stress. If you need help doing this, ask your doctor.  If you are overweight, lose an amount of weight that is healthy for you. Ask your doctor about a safe weight loss goal. General instructions  Pay attention to any changes in your symptoms.  Take over-the-counter and prescription medicines only as told by your doctor. Do not take aspirin, ibuprofen, or  other NSAIDs unless your doctor says it is okay.  Wear loose clothes. Do not wear anything tight around your waist.  Raise (elevate) the head of your bed about 6 inches (15 cm).  Avoid bending over if this makes your symptoms worse.  Keep all follow-up visits as told by your doctor. This is important. Contact a doctor if:  You have new symptoms.  You lose weight and you do not know why.  You have trouble swallowing or it hurts to swallow.  You have wheezing or a cough that keeps happening.  Your symptoms do not get better with treatment.  You have a hoarse voice. Get help right away if:  You have pain in your arms, neck, jaw, teeth, or back.  You feel sweaty, dizzy, or light-headed.  You have chest pain or shortness of breath.  You throw up (vomit) and your throw-up looks like blood or coffee grounds.  You pass out (faint).  Your poop (stool) is bloody or black.  You cannot swallow, drink, or eat. Summary  If a person has gastroesophageal reflux disease (GERD), food and stomach acid move back up into the esophagus and cause symptoms or problems such as damage to the esophagus.  Treatment will depend on how bad your symptoms are.  Follow a diet as told by your doctor.  Take all medicines only as told by your doctor. This information is not intended to replace advice given to you by your health care provider. Make sure you discuss any questions you have with your health care provider. Document Released: 04/29/2008 Document Revised: 05/20/2018 Document Reviewed: 05/20/2018 Elsevier Patient Education  2020 [employer].

## 2019-10-13 NOTE — Progress Notes (Signed)
Subjective:    Patient ID: Jade [name], female    DOB: [birth year], 63 y.o.   MRN: [id number]  HPI   Patient is a 63 yr old female who presents today with concern about reflux symptoms.  Patient reports that it feels like her food seems to get stuck in her esophagus sometimes.  Reports that she has to drink a lot of water to help it get down. Sometimes she burps and it "comes back" up. Wakes up with burning pain in her esophagus.  Reports that she has been taking protonix twice daily for the last week or so and  It is not helping.   Review of Systems See  HPI  Past Medical History:  Diagnosis Date  . Benign microscopic hematuria    neg cystoscopy 11/12- dr. Janice Norrie  . DJD (degenerative joint disease)    L KNEE  . Family history of anesthesia complication    multiple family members with history of this  . GERD (gastroesophageal reflux disease)   . Hurthle cell neoplasm of thyroid   . Hyperlipidemia   . Hypertension   . Malignant hyperthermia   . Numbness and tingling in left arm      Social History   Socioeconomic History  . Marital status: Married    Spouse name: Not on file  . Number of children: 3  . Years of education: Not on file  . Highest education level: Not on file  Occupational History  . Not on file  Social Needs  . Financial resource strain: Not on file  . Food insecurity    Worry: Not on file    Inability: Not on file  . Transportation needs    Medical: Not on file    Non-medical: Not on file  Tobacco Use  . Smoking status: Current Every Day Smoker    Packs/day: 1.00    Years: 39.00    Pack years: 39.00    Types: Cigarettes  . Smokeless tobacco: Never Used  Substance and Sexual Activity  . Alcohol use: No    Alcohol/week: 0.0 standard drinks  . Drug use: No  . Sexual activity: Yes    Partners: Male  Lifestyle  . Physical activity    Days per week: Not on file    Minutes per session: Not on file  . Stress: Not on file  Relationships  .  Social Herbalist on phone: Not on file    Gets together: Not on file    Attends religious service: Not on file    Active member of club or organization: Not on file    Attends meetings of clubs or organizations: Not on file    Relationship status: Not on file  . Intimate partner violence    Fear of current or ex partner: Not on file    Emotionally abused: Not on file    Physically abused: Not on file    Forced sexual activity: Not on file  Other Topics Concern  . Not on file  Social History Narrative   Regular exercise:  No   Caffeine Use:  2 cups coffee and 3-4 glasses of soda daily.   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    Past Surgical History:  Procedure Laterality Date  . ABDOMINAL HYSTERECTOMY  1996  . BREAST EXCISIONAL BIOPSY Left 01/2016  . BREAST EXCISIONAL BIOPSY Left 03/2016  . CERVICAL  [place]  2013   Dr. Arnoldo Morale  . DILATION AND CURETTAGE OF UTERUS    . KNEE CARTILAGE SURGERY  1999   right knee  . ROTATOR CUFF REPAIR Right 05/25/13  . 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 TOTAL KNEE ARTHROPLASTY Right 01/19/2015   Procedure: RIGHT TOTAL KNEE ARTHROPLASTY;  Surgeon: Johnn Hai, MD;  Location: [facility];  Service: Orthopedics;  Laterality: Right;  . TUBAL LIGATION      Family History  Problem Relation Age of Onset  . Hypertension Mother   . Hyperlipidemia Mother   . Heart disease Father        CAD; stent at age 48  . Diabetes Father   . Hypertension Father   . Hyperlipidemia Father   . Cancer Father        prostate  . Dementia Father   . Cancer Paternal Aunt        BREAST  . Breast cancer Paternal Aunt     Allergies  Allergen Reactions  . Shrimp [Shellfish Allergy] Itching    Rash and lips itching  . Ace Inhibitors     Cough  .  Aspirin Nausea And Vomiting  . Azithromycin Rash    Current Outpatient Medications on File Prior to Visit  Medication Sig Dispense Refill  . albuterol (PROVENTIL HFA;VENTOLIN HFA) 108 (90 Base) MCG/ACT inhaler Inhale 2 puffs into the lungs every 6 (six) hours as needed for wheezing or shortness of breath. 1 Inhaler 0  . albuterol (VENTOLIN HFA) 108 (90 Base) MCG/ACT inhaler Inhale 2 puffs into the lungs every 6 (six) hours as needed for wheezing or shortness of breath. 6.7 g 2  . amLODipine (NORVASC) 5 MG tablet TAKE 1 TABLET (5 MG TOTAL) BY MOUTH DAILY. 90 tablet 1  . fluticasone (FLONASE) 50 MCG/ACT nasal spray USE 2 SPRAYS IN EACH NOSTRIL EVERY DAY 48 g 1  . gabapentin (NEURONTIN) 300 MG capsule Take 1 capsule by mouth daily in the morning, 1 at noon and 2 at bedtime. 360 capsule 1  . LINZESS 145 MCG CAPS capsule TAKE 1 CAPSULE (145 MCG TOTAL) BY MOUTH DAILY. 30 capsule 5  . potassium chloride SA (K-DUR,KLOR-CON) 20 MEQ tablet TAKE 1 TABLET (20 MEQ TOTAL) 2 (TWO) TIMES DAILY  180 tablet 1   No current facility-administered medications on file prior to visit.     BP 122/79 (BP Location: Right Arm, Patient Position: Sitting, Cuff Size: Small)   Pulse 79   Temp (!) 96.2 F (35.7 C) (Temporal)   Resp 16   Ht 5\' 6"  (1.676 m)   Wt 190 lb (86.2 kg)   LMP 11/25/1994   SpO2 100%   BMI 30.67 kg/m       Objective:   Physical Exam Constitutional:      Appearance: She is well-developed.  Cardiovascular:     Rate and Rhythm: Normal rate and regular rhythm.     Heart sounds: Normal heart sounds. No murmur.  Pulmonary:     Effort: Pulmonary effort is normal. No respiratory distress.     Breath sounds: Normal breath sounds. No wheezing.  Psychiatric:        Behavior: Behavior normal.        Thought Content: Thought content normal.        Judgment: Judgment normal.  Assessment & Plan:  GERD/Dysphagia- new. Discussed gerd diet.  Will continue protonix bid for now and  refer to GI for further evaluation/endoscopy.

## 2019-10-14 ENCOUNTER — Ambulatory Visit: Payer: Medicare Other

## 2019-10-21 ENCOUNTER — Encounter (HOSPITAL_BASED_OUTPATIENT_CLINIC_OR_DEPARTMENT_OTHER): Payer: Self-pay

## 2019-10-21 ENCOUNTER — Inpatient Hospital Stay (HOSPITAL_BASED_OUTPATIENT_CLINIC_OR_DEPARTMENT_OTHER)
Admission: EM | Admit: 2019-10-21 | Discharge: 2019-10-26 | DRG: 246 | Disposition: A | Discharge status: Home / Self Care | Payer: Medicare Other | Attending: Cardiovascular Disease | Admitting: Cardiovascular Disease

## 2019-10-21 ENCOUNTER — Other Ambulatory Visit: Payer: Self-pay

## 2019-10-21 DIAGNOSIS — N179 Acute kidney failure, unspecified: Secondary | ICD-10-CM | POA: Diagnosis present

## 2019-10-21 DIAGNOSIS — I249 Acute ischemic heart disease, unspecified: Secondary | ICD-10-CM | POA: Diagnosis present

## 2019-10-21 DIAGNOSIS — F17210 Nicotine dependence, cigarettes, uncomplicated: Secondary | ICD-10-CM | POA: Diagnosis present

## 2019-10-21 DIAGNOSIS — Z6830 Body mass index (BMI) 30.0-30.9, adult: Secondary | ICD-10-CM

## 2019-10-21 DIAGNOSIS — R531 Weakness: Secondary | ICD-10-CM

## 2019-10-21 DIAGNOSIS — I214 Non-ST elevation (NSTEMI) myocardial infarction: Secondary | ICD-10-CM | POA: Diagnosis not present

## 2019-10-21 DIAGNOSIS — I2510 Atherosclerotic heart disease of native coronary artery without angina pectoris: Secondary | ICD-10-CM

## 2019-10-21 DIAGNOSIS — Z79899 Other long term (current) drug therapy: Secondary | ICD-10-CM

## 2019-10-21 DIAGNOSIS — N289 Disorder of kidney and ureter, unspecified: Secondary | ICD-10-CM

## 2019-10-21 DIAGNOSIS — R7303 Prediabetes: Secondary | ICD-10-CM | POA: Diagnosis present

## 2019-10-21 DIAGNOSIS — E663 Overweight: Secondary | ICD-10-CM | POA: Diagnosis present

## 2019-10-21 DIAGNOSIS — Z881 Allergy status to other antibiotic agents status: Secondary | ICD-10-CM

## 2019-10-21 DIAGNOSIS — E119 Type 2 diabetes mellitus without complications: Secondary | ICD-10-CM | POA: Diagnosis present

## 2019-10-21 DIAGNOSIS — Z91013 Allergy to seafood: Secondary | ICD-10-CM

## 2019-10-21 DIAGNOSIS — K219 Gastro-esophageal reflux disease without esophagitis: Secondary | ICD-10-CM | POA: Diagnosis present

## 2019-10-21 DIAGNOSIS — I5041 Acute combined systolic (congestive) and diastolic (congestive) heart failure: Secondary | ICD-10-CM | POA: Diagnosis present

## 2019-10-21 DIAGNOSIS — I255 Ischemic cardiomyopathy: Secondary | ICD-10-CM

## 2019-10-21 DIAGNOSIS — Z955 Presence of coronary angioplasty implant and graft: Secondary | ICD-10-CM

## 2019-10-21 DIAGNOSIS — E876 Hypokalemia: Secondary | ICD-10-CM

## 2019-10-21 DIAGNOSIS — I110 Hypertensive heart disease with heart failure: Secondary | ICD-10-CM | POA: Diagnosis present

## 2019-10-21 DIAGNOSIS — Z8249 Family history of ischemic heart disease and other diseases of the circulatory system: Secondary | ICD-10-CM

## 2019-10-21 DIAGNOSIS — E871 Hypo-osmolality and hyponatremia: Secondary | ICD-10-CM

## 2019-10-21 DIAGNOSIS — Z888 Allergy status to other drugs, medicaments and biological substances status: Secondary | ICD-10-CM

## 2019-10-21 DIAGNOSIS — E785 Hyperlipidemia, unspecified: Secondary | ICD-10-CM | POA: Diagnosis present

## 2019-10-21 DIAGNOSIS — I10 Essential (primary) hypertension: Secondary | ICD-10-CM

## 2019-10-21 DIAGNOSIS — Z720 Tobacco use: Secondary | ICD-10-CM | POA: Diagnosis present

## 2019-10-21 DIAGNOSIS — Z886 Allergy status to analgesic agent status: Secondary | ICD-10-CM

## 2019-10-21 DIAGNOSIS — Z20828 Contact with and (suspected) exposure to other viral communicable diseases: Secondary | ICD-10-CM | POA: Diagnosis present

## 2019-10-21 DIAGNOSIS — I5032 Chronic diastolic (congestive) heart failure: Secondary | ICD-10-CM

## 2019-10-21 MED ORDER — ONDANSETRON HCL 4 MG/2ML IJ SOLN
4.0000 mg | Freq: Once | INTRAMUSCULAR | Status: AC
  Administered 2019-10-22: 4 mg via INTRAVENOUS
  Filled 2019-10-21: qty 2

## 2019-10-21 MED ORDER — ALUM & MAG HYDROXIDE-SIMETH 200-200-20 MG/5ML PO SUSP
30.0000 mL | Freq: Once | ORAL | Status: AC
  Administered 2019-10-22: 30 mL via ORAL
  Filled 2019-10-21: qty 30

## 2019-10-21 NOTE — ED Triage Notes (Signed)
Pt reports "feeling weak" since Tuesday with 2 episodes of vomiting. Pt reports decrease in appetite. Pt had steady gait to treatment room. Pt denies fevers, SOB, chest pain, cough.

## 2019-10-22 ENCOUNTER — Encounter (HOSPITAL_COMMUNITY)
Admission: EM | Disposition: A | Discharge status: Home / Self Care | Payer: Self-pay | Source: Home / Self Care | Attending: Cardiovascular Disease

## 2019-10-22 ENCOUNTER — Inpatient Hospital Stay (HOSPITAL_COMMUNITY): Payer: Medicare Other

## 2019-10-22 ENCOUNTER — Encounter (HOSPITAL_BASED_OUTPATIENT_CLINIC_OR_DEPARTMENT_OTHER): Payer: Self-pay | Admitting: Emergency Medicine

## 2019-10-22 ENCOUNTER — Emergency Department (HOSPITAL_BASED_OUTPATIENT_CLINIC_OR_DEPARTMENT_OTHER): Payer: Medicare Other

## 2019-10-22 DIAGNOSIS — K219 Gastro-esophageal reflux disease without esophagitis: Secondary | ICD-10-CM | POA: Diagnosis present

## 2019-10-22 DIAGNOSIS — Z886 Allergy status to analgesic agent status: Secondary | ICD-10-CM | POA: Diagnosis not present

## 2019-10-22 DIAGNOSIS — E871 Hypo-osmolality and hyponatremia: Secondary | ICD-10-CM | POA: Diagnosis present

## 2019-10-22 DIAGNOSIS — Z888 Allergy status to other drugs, medicaments and biological substances status: Secondary | ICD-10-CM | POA: Diagnosis not present

## 2019-10-22 DIAGNOSIS — I249 Acute ischemic heart disease, unspecified: Secondary | ICD-10-CM

## 2019-10-22 DIAGNOSIS — N179 Acute kidney failure, unspecified: Secondary | ICD-10-CM | POA: Diagnosis present

## 2019-10-22 DIAGNOSIS — I2102 ST elevation (STEMI) myocardial infarction involving left anterior descending coronary artery: Secondary | ICD-10-CM

## 2019-10-22 DIAGNOSIS — Z79899 Other long term (current) drug therapy: Secondary | ICD-10-CM | POA: Diagnosis not present

## 2019-10-22 DIAGNOSIS — I214 Non-ST elevation (NSTEMI) myocardial infarction: Secondary | ICD-10-CM

## 2019-10-22 DIAGNOSIS — F17210 Nicotine dependence, cigarettes, uncomplicated: Secondary | ICD-10-CM | POA: Diagnosis present

## 2019-10-22 DIAGNOSIS — I10 Essential (primary) hypertension: Secondary | ICD-10-CM | POA: Diagnosis not present

## 2019-10-22 DIAGNOSIS — Z8249 Family history of ischemic heart disease and other diseases of the circulatory system: Secondary | ICD-10-CM | POA: Diagnosis not present

## 2019-10-22 DIAGNOSIS — I110 Hypertensive heart disease with heart failure: Secondary | ICD-10-CM | POA: Diagnosis present

## 2019-10-22 DIAGNOSIS — Z20828 Contact with and (suspected) exposure to other viral communicable diseases: Secondary | ICD-10-CM | POA: Diagnosis present

## 2019-10-22 DIAGNOSIS — I361 Nonrheumatic tricuspid (valve) insufficiency: Secondary | ICD-10-CM

## 2019-10-22 DIAGNOSIS — E785 Hyperlipidemia, unspecified: Secondary | ICD-10-CM | POA: Diagnosis present

## 2019-10-22 DIAGNOSIS — I255 Ischemic cardiomyopathy: Secondary | ICD-10-CM | POA: Diagnosis present

## 2019-10-22 DIAGNOSIS — I2510 Atherosclerotic heart disease of native coronary artery without angina pectoris: Secondary | ICD-10-CM

## 2019-10-22 DIAGNOSIS — R7303 Prediabetes: Secondary | ICD-10-CM | POA: Diagnosis present

## 2019-10-22 DIAGNOSIS — I340 Nonrheumatic mitral (valve) insufficiency: Secondary | ICD-10-CM | POA: Diagnosis not present

## 2019-10-22 DIAGNOSIS — Z881 Allergy status to other antibiotic agents status: Secondary | ICD-10-CM | POA: Diagnosis not present

## 2019-10-22 DIAGNOSIS — I5041 Acute combined systolic (congestive) and diastolic (congestive) heart failure: Secondary | ICD-10-CM | POA: Diagnosis present

## 2019-10-22 DIAGNOSIS — Z720 Tobacco use: Secondary | ICD-10-CM | POA: Diagnosis not present

## 2019-10-22 DIAGNOSIS — Z91013 Allergy to seafood: Secondary | ICD-10-CM | POA: Diagnosis not present

## 2019-10-22 DIAGNOSIS — E876 Hypokalemia: Secondary | ICD-10-CM | POA: Diagnosis present

## 2019-10-22 DIAGNOSIS — E663 Overweight: Secondary | ICD-10-CM | POA: Diagnosis present

## 2019-10-22 DIAGNOSIS — Z6830 Body mass index (BMI) 30.0-30.9, adult: Secondary | ICD-10-CM | POA: Diagnosis not present

## 2019-10-22 HISTORY — DX: Non-ST elevation (NSTEMI) myocardial infarction: I21.4

## 2019-10-22 HISTORY — PX: LEFT HEART CATH AND CORONARY ANGIOGRAPHY: CATH118249

## 2019-10-22 HISTORY — DX: Acute ischemic heart disease, unspecified: I24.9

## 2019-10-22 HISTORY — PX: CORONARY STENT INTERVENTION: CATH118234

## 2019-10-22 LAB — CBC WITH DIFFERENTIAL/PLATELET
Abs Immature Granulocytes: 0.05 10*3/uL (ref 0.00–0.07)
Basophils Absolute: 0 10*3/uL (ref 0.0–0.1)
Basophils Relative: 0 %
Eosinophils Absolute: 0 10*3/uL (ref 0.0–0.5)
Eosinophils Relative: 0 %
HCT: 45.3 % (ref 36.0–46.0)
Hemoglobin: 14.5 g/dL (ref 12.0–15.0)
Immature Granulocytes: 0 %
Lymphocytes Relative: 23 %
Lymphs Abs: 3.5 10*3/uL (ref 0.7–4.0)
MCH: 26.2 pg (ref 26.0–34.0)
MCHC: 32 g/dL (ref 30.0–36.0)
MCV: 81.8 fL (ref 80.0–100.0)
Monocytes Absolute: 2.3 10*3/uL — ABNORMAL HIGH (ref 0.1–1.0)
Monocytes Relative: 15 %
Neutro Abs: 9.5 10*3/uL — ABNORMAL HIGH (ref 1.7–7.7)
Neutrophils Relative %: 62 %
Platelets: 360 10*3/uL (ref 150–400)
RBC: 5.54 MIL/uL — ABNORMAL HIGH (ref 3.87–5.11)
RDW: 14 % (ref 11.5–15.5)
WBC: 15.4 10*3/uL — ABNORMAL HIGH (ref 4.0–10.5)
nRBC: 0 % (ref 0.0–0.2)

## 2019-10-22 LAB — CBC
HCT: 44.7 % (ref 36.0–46.0)
Hemoglobin: 14.4 g/dL (ref 12.0–15.0)
MCH: 26.3 pg (ref 26.0–34.0)
MCHC: 32.2 g/dL (ref 30.0–36.0)
MCV: 81.7 fL (ref 80.0–100.0)
Platelets: 338 10*3/uL (ref 150–400)
RBC: 5.47 MIL/uL — ABNORMAL HIGH (ref 3.87–5.11)
RDW: 13.9 % (ref 11.5–15.5)
WBC: 13.6 10*3/uL — ABNORMAL HIGH (ref 4.0–10.5)
nRBC: 0 % (ref 0.0–0.2)

## 2019-10-22 LAB — COMPREHENSIVE METABOLIC PANEL
ALT: 20 U/L (ref 0–44)
AST: 36 U/L (ref 15–41)
Albumin: 4.4 g/dL (ref 3.5–5.0)
Alkaline Phosphatase: 85 U/L (ref 38–126)
Anion gap: 11 (ref 5–15)
BUN: 32 mg/dL — ABNORMAL HIGH (ref 8–23)
CO2: 25 mmol/L (ref 22–32)
Calcium: 9.8 mg/dL (ref 8.9–10.3)
Chloride: 94 mmol/L — ABNORMAL LOW (ref 98–111)
Creatinine, Ser: 2.03 mg/dL — ABNORMAL HIGH (ref 0.44–1.00)
GFR calc Af Amer: 29 mL/min — ABNORMAL LOW (ref >60)
GFR calc non Af Amer: 25 mL/min — ABNORMAL LOW (ref >60)
Glucose, Bld: 111 mg/dL — ABNORMAL HIGH (ref 70–99)
Potassium: 3.3 mmol/L — ABNORMAL LOW (ref 3.5–5.1)
Sodium: 130 mmol/L — ABNORMAL LOW (ref 135–145)
Total Bilirubin: 0.6 mg/dL (ref 0.3–1.2)
Total Protein: 8.2 g/dL — ABNORMAL HIGH (ref 6.5–8.1)

## 2019-10-22 LAB — SARS CORONAVIRUS 2 (TAT 6-24 HRS): SARS Coronavirus 2: NEGATIVE

## 2019-10-22 LAB — MAGNESIUM: Magnesium: 2.6 mg/dL — ABNORMAL HIGH (ref 1.7–2.4)

## 2019-10-22 LAB — BASIC METABOLIC PANEL
Anion gap: 16 — ABNORMAL HIGH (ref 5–15)
BUN: 29 mg/dL — ABNORMAL HIGH (ref 8–23)
CO2: 25 mmol/L (ref 22–32)
Calcium: 9.2 mg/dL (ref 8.9–10.3)
Chloride: 96 mmol/L — ABNORMAL LOW (ref 98–111)
Creatinine, Ser: 1.5 mg/dL — ABNORMAL HIGH (ref 0.44–1.00)
GFR calc Af Amer: 42 mL/min — ABNORMAL LOW (ref >60)
GFR calc non Af Amer: 36 mL/min — ABNORMAL LOW (ref >60)
Glucose, Bld: 103 mg/dL — ABNORMAL HIGH (ref 70–99)
Potassium: 3.6 mmol/L (ref 3.5–5.1)
Sodium: 137 mmol/L (ref 135–145)

## 2019-10-22 LAB — SARS CORONAVIRUS 2 AG (30 MIN TAT): SARS Coronavirus 2 Ag: NEGATIVE

## 2019-10-22 LAB — HEMOGLOBIN A1C
Hgb A1c MFr Bld: 6 % — ABNORMAL HIGH (ref 4.8–5.6)
Mean Plasma Glucose: 125.5 mg/dL

## 2019-10-22 LAB — ECHOCARDIOGRAM COMPLETE
Height: 66 in
Weight: 2906.54 oz

## 2019-10-22 LAB — POCT ACTIVATED CLOTTING TIME: Activated Clotting Time: 488 seconds

## 2019-10-22 LAB — TROPONIN I (HIGH SENSITIVITY)
Troponin I (High Sensitivity): 11904 ng/L (ref <18)
Troponin I (High Sensitivity): 13910 ng/L (ref <18)

## 2019-10-22 SURGERY — LEFT HEART CATH AND CORONARY ANGIOGRAPHY
Anesthesia: LOCAL

## 2019-10-22 MED ORDER — BIVALIRUDIN TRIFLUOROACETATE 250 MG IV SOLR
INTRAVENOUS | Status: AC
  Filled 2019-10-22: qty 250

## 2019-10-22 MED ORDER — LIDOCAINE HCL (PF) 1 % IJ SOLN
INTRAMUSCULAR | Status: AC
  Filled 2019-10-22: qty 30

## 2019-10-22 MED ORDER — ASPIRIN EC 81 MG PO TBEC
81.0000 mg | DELAYED_RELEASE_TABLET | Freq: Every day | ORAL | Status: DC
  Administered 2019-10-23 – 2019-10-25 (×3): 81 mg via ORAL
  Filled 2019-10-22 (×3): qty 1

## 2019-10-22 MED ORDER — VERAPAMIL HCL 2.5 MG/ML IV SOLN
INTRAVENOUS | Status: AC
  Filled 2019-10-22: qty 2

## 2019-10-22 MED ORDER — ONDANSETRON HCL 4 MG/2ML IJ SOLN: 4.0000 mg | Freq: Four times a day (QID) | INTRAMUSCULAR | Status: DC | PRN

## 2019-10-22 MED ORDER — HEPARIN SODIUM (PORCINE) 1000 UNIT/ML IJ SOLN
INTRAMUSCULAR | Status: DC | PRN
  Administered 2019-10-22: 4000 [IU] via INTRAVENOUS

## 2019-10-22 MED ORDER — NITROGLYCERIN 0.4 MG SL SUBL
SUBLINGUAL_TABLET | SUBLINGUAL | Status: AC
  Administered 2019-10-24: 0.4 mg via SUBLINGUAL
  Filled 2019-10-22: qty 1

## 2019-10-22 MED ORDER — ALUM & MAG HYDROXIDE-SIMETH 200-200-20 MG/5ML PO SUSP: 30.0000 mL | Freq: Once | ORAL | Status: DC

## 2019-10-22 MED ORDER — SODIUM CHLORIDE 0.9 % IV SOLN: 250.0000 mL | INTRAVENOUS | Status: DC | PRN

## 2019-10-22 MED ORDER — SODIUM CHLORIDE 0.9 % IV SOLN
INTRAVENOUS | Status: AC | PRN
  Administered 2019-10-22 (×2): 1.75 mg/kg/h via INTRAVENOUS

## 2019-10-22 MED ORDER — GABAPENTIN 300 MG PO CAPS: 300.0000 mg | ORAL_CAPSULE | Freq: Three times a day (TID) | ORAL | Status: DC

## 2019-10-22 MED ORDER — ACETAMINOPHEN 325 MG PO TABS
650.0000 mg | ORAL_TABLET | ORAL | Status: DC | PRN
  Administered 2019-10-22: 650 mg via ORAL
  Filled 2019-10-22: qty 2

## 2019-10-22 MED ORDER — ACETAMINOPHEN 325 MG PO TABS: 650.0000 mg | ORAL_TABLET | ORAL | Status: DC | PRN

## 2019-10-22 MED ORDER — FENTANYL CITRATE (PF) 100 MCG/2ML IJ SOLN: 50.0000 ug | Freq: Once | INTRAMUSCULAR | Status: DC

## 2019-10-22 MED ORDER — FENTANYL CITRATE (PF) 100 MCG/2ML IJ SOLN
INTRAMUSCULAR | Status: AC
  Filled 2019-10-22: qty 2

## 2019-10-22 MED ORDER — ATORVASTATIN CALCIUM 80 MG PO TABS: 80.0000 mg | ORAL_TABLET | Freq: Every day | ORAL | Status: DC

## 2019-10-22 MED ORDER — POTASSIUM CHLORIDE CRYS ER 20 MEQ PO TBCR
20.0000 meq | EXTENDED_RELEASE_TABLET | Freq: Two times a day (BID) | ORAL | Status: DC
  Administered 2019-10-22 – 2019-10-26 (×8): 20 meq via ORAL
  Filled 2019-10-22 (×8): qty 1

## 2019-10-22 MED ORDER — MIDAZOLAM HCL 2 MG/2ML IJ SOLN
INTRAMUSCULAR | Status: AC
  Filled 2019-10-22: qty 2

## 2019-10-22 MED ORDER — ISOSORBIDE MONONITRATE ER 30 MG PO TB24
30.0000 mg | ORAL_TABLET | Freq: Every day | ORAL | Status: DC
  Administered 2019-10-22 – 2019-10-26 (×5): 30 mg via ORAL
  Filled 2019-10-22 (×5): qty 1

## 2019-10-22 MED ORDER — HEPARIN (PORCINE) IN NACL 1000-0.9 UT/500ML-% IV SOLN
INTRAVENOUS | Status: DC | PRN
  Administered 2019-10-22 (×2): 500 mL

## 2019-10-22 MED ORDER — HEPARIN SODIUM (PORCINE) 1000 UNIT/ML IJ SOLN
INTRAMUSCULAR | Status: AC
  Filled 2019-10-22: qty 1

## 2019-10-22 MED ORDER — LIDOCAINE HCL (PF) 1 % IJ SOLN
INTRAMUSCULAR | Status: DC | PRN
  Administered 2019-10-22: 2 mL via INTRADERMAL

## 2019-10-22 MED ORDER — ASPIRIN 81 MG PO CHEW: 81.0000 mg | CHEWABLE_TABLET | Freq: Every day | ORAL | Status: DC

## 2019-10-22 MED ORDER — THE SENSUOUS HEART BOOK
Freq: Once | Status: AC
  Administered 2019-10-23: 02:00:00
  Filled 2019-10-22: qty 1

## 2019-10-22 MED ORDER — SODIUM CHLORIDE 0.9 % IV SOLN: INTRAVENOUS | Status: DC

## 2019-10-22 MED ORDER — POTASSIUM CHLORIDE 10 MEQ/100ML IV SOLN
10.0000 meq | INTRAVENOUS | Status: AC
  Administered 2019-10-22 (×4): 10 meq via INTRAVENOUS
  Filled 2019-10-22 (×3): qty 100

## 2019-10-22 MED ORDER — NITROGLYCERIN 1 MG/10 ML FOR IR/CATH LAB
INTRA_ARTERIAL | Status: DC | PRN
  Administered 2019-10-22 (×2): 200 ug via INTRACORONARY

## 2019-10-22 MED ORDER — HEPARIN BOLUS VIA INFUSION
4000.0000 [IU] | Freq: Once | INTRAVENOUS | Status: AC
  Administered 2019-10-22: 4000 [IU] via INTRAVENOUS

## 2019-10-22 MED ORDER — LABETALOL HCL 5 MG/ML IV SOLN: 10.0000 mg | INTRAVENOUS | Status: AC | PRN

## 2019-10-22 MED ORDER — HEART ATTACK BOUNCING BOOK
Freq: Once | Status: AC
  Administered 2019-10-23: 02:00:00
  Filled 2019-10-22: qty 1

## 2019-10-22 MED ORDER — PANTOPRAZOLE SODIUM 40 MG PO TBEC
40.0000 mg | DELAYED_RELEASE_TABLET | Freq: Two times a day (BID) | ORAL | Status: DC
  Administered 2019-10-22 – 2019-10-26 (×8): 40 mg via ORAL
  Filled 2019-10-22 (×8): qty 1

## 2019-10-22 MED ORDER — HEPARIN (PORCINE) 25000 UT/250ML-% IV SOLN
1000.0000 [IU]/h | INTRAVENOUS | Status: DC
  Administered 2019-10-22: 1000 [IU]/h via INTRAVENOUS
  Filled 2019-10-22: qty 250

## 2019-10-22 MED ORDER — IOHEXOL 350 MG/ML SOLN
INTRAVENOUS | Status: DC | PRN
  Administered 2019-10-22: 150 mL via INTRA_ARTERIAL

## 2019-10-22 MED ORDER — FENTANYL CITRATE (PF) 100 MCG/2ML IJ SOLN
INTRAMUSCULAR | Status: DC | PRN
  Administered 2019-10-22 (×2): 25 ug via INTRAVENOUS

## 2019-10-22 MED ORDER — GABAPENTIN 300 MG PO CAPS
300.0000 mg | ORAL_CAPSULE | Freq: Two times a day (BID) | ORAL | Status: DC
  Administered 2019-10-22 – 2019-10-26 (×7): 300 mg via ORAL
  Filled 2019-10-22 (×7): qty 1

## 2019-10-22 MED ORDER — AMLODIPINE BESYLATE 5 MG PO TABS: 5.0000 mg | ORAL_TABLET | Freq: Every day | ORAL | Status: DC

## 2019-10-22 MED ORDER — ONDANSETRON HCL 4 MG/2ML IJ SOLN
INTRAMUSCULAR | Status: AC
  Filled 2019-10-22: qty 2

## 2019-10-22 MED ORDER — FENTANYL CITRATE (PF) 100 MCG/2ML IJ SOLN
50.0000 ug | Freq: Once | INTRAMUSCULAR | Status: AC
  Administered 2019-10-22: 50 ug via INTRAVENOUS

## 2019-10-22 MED ORDER — METOPROLOL TARTRATE 12.5 MG HALF TABLET
12.5000 mg | ORAL_TABLET | Freq: Two times a day (BID) | ORAL | Status: DC
  Administered 2019-10-22 (×2): 12.5 mg via ORAL
  Filled 2019-10-22: qty 1

## 2019-10-22 MED ORDER — NITROGLYCERIN 1 MG/10 ML FOR IR/CATH LAB
INTRA_ARTERIAL | Status: AC
  Filled 2019-10-22: qty 10

## 2019-10-22 MED ORDER — BIVALIRUDIN BOLUS VIA INFUSION - CUPID
INTRAVENOUS | Status: DC | PRN
  Administered 2019-10-22: 61.8 mg via INTRAVENOUS

## 2019-10-22 MED ORDER — TICAGRELOR 90 MG PO TABS
ORAL_TABLET | ORAL | Status: AC
  Filled 2019-10-22: qty 2

## 2019-10-22 MED ORDER — SODIUM CHLORIDE 0.9% FLUSH
3.0000 mL | Freq: Two times a day (BID) | INTRAVENOUS | Status: DC
  Administered 2019-10-22 – 2019-10-25 (×5): 3 mL via INTRAVENOUS

## 2019-10-22 MED ORDER — SODIUM CHLORIDE 0.9% FLUSH: 3.0000 mL | Freq: Two times a day (BID) | INTRAVENOUS | Status: DC

## 2019-10-22 MED ORDER — HYDRALAZINE HCL 20 MG/ML IJ SOLN: 10.0000 mg | INTRAMUSCULAR | Status: AC | PRN

## 2019-10-22 MED ORDER — VERAPAMIL HCL 2.5 MG/ML IV SOLN
INTRAVENOUS | Status: DC | PRN
  Administered 2019-10-22: 10 mL via INTRA_ARTERIAL

## 2019-10-22 MED ORDER — MIDAZOLAM HCL 2 MG/2ML IJ SOLN
INTRAMUSCULAR | Status: DC | PRN
  Administered 2019-10-22: 2 mg via INTRAVENOUS
  Administered 2019-10-22: 1 mg via INTRAVENOUS

## 2019-10-22 MED ORDER — DIAZEPAM 5 MG PO TABS: 5.0000 mg | ORAL_TABLET | Freq: Four times a day (QID) | ORAL | Status: DC | PRN

## 2019-10-22 MED ORDER — SODIUM CHLORIDE 0.9% FLUSH: 3.0000 mL | INTRAVENOUS | Status: DC | PRN

## 2019-10-22 MED ORDER — METHOCARBAMOL 500 MG PO TABS
500.0000 mg | ORAL_TABLET | Freq: Two times a day (BID) | ORAL | Status: DC | PRN
  Administered 2019-10-25: 500 mg via ORAL
  Filled 2019-10-22: qty 1

## 2019-10-22 MED ORDER — NITROGLYCERIN 0.4 MG SL SUBL
0.4000 mg | SUBLINGUAL_TABLET | SUBLINGUAL | Status: DC | PRN
  Administered 2019-10-24: 07:00:00 0.4 mg via SUBLINGUAL
  Filled 2019-10-22: qty 1

## 2019-10-22 MED ORDER — TICAGRELOR 90 MG PO TABS
90.0000 mg | ORAL_TABLET | Freq: Two times a day (BID) | ORAL | Status: DC
  Administered 2019-10-22 – 2019-10-26 (×8): 90 mg via ORAL
  Filled 2019-10-22 (×8): qty 1

## 2019-10-22 MED ORDER — GABAPENTIN 300 MG PO CAPS
600.0000 mg | ORAL_CAPSULE | Freq: Every day | ORAL | Status: DC
  Administered 2019-10-22 – 2019-10-25 (×4): 600 mg via ORAL
  Filled 2019-10-22 (×4): qty 2

## 2019-10-22 MED ORDER — ONDANSETRON HCL 4 MG/2ML IJ SOLN: 4.0000 mg | Freq: Once | INTRAMUSCULAR | Status: DC

## 2019-10-22 MED ORDER — SODIUM CHLORIDE 0.9 % IV SOLN
INTRAVENOUS | Status: DC
  Administered 2019-10-22 (×2): via INTRAVENOUS

## 2019-10-22 MED ORDER — HEPARIN (PORCINE) IN NACL 1000-0.9 UT/500ML-% IV SOLN
INTRAVENOUS | Status: AC
  Filled 2019-10-22: qty 1000

## 2019-10-22 MED ORDER — TICAGRELOR 90 MG PO TABS
ORAL_TABLET | ORAL | Status: DC | PRN
  Administered 2019-10-22: 180 mg via ORAL

## 2019-10-22 MED ORDER — METOPROLOL TARTRATE 12.5 MG HALF TABLET
12.5000 mg | ORAL_TABLET | Freq: Two times a day (BID) | ORAL | Status: DC
  Filled 2019-10-22: qty 1

## 2019-10-22 MED ORDER — ASPIRIN 81 MG PO CHEW
324.0000 mg | CHEWABLE_TABLET | Freq: Once | ORAL | Status: AC
  Administered 2019-10-22: 324 mg via ORAL
  Filled 2019-10-22: qty 4

## 2019-10-22 MED ORDER — ONDANSETRON HCL 4 MG/2ML IJ SOLN
INTRAMUSCULAR | Status: DC | PRN
  Administered 2019-10-22: 4 mg via INTRAVENOUS

## 2019-10-22 MED ORDER — ATORVASTATIN CALCIUM 80 MG PO TABS
80.0000 mg | ORAL_TABLET | Freq: Every day | ORAL | Status: DC
  Administered 2019-10-22 – 2019-10-24 (×3): 80 mg via ORAL
  Filled 2019-10-22 (×3): qty 1

## 2019-10-22 MED ORDER — ANGIOPLASTY BOOK
Freq: Once | Status: AC
  Administered 2019-10-23: 02:00:00
  Filled 2019-10-22: qty 1

## 2019-10-22 SURGICAL SUPPLY — 20 items
BALLN SAPPHIRE 2.0X12 (BALLOONS) ×2
BALLN SAPPHIRE 2.5X15 (BALLOONS) ×2
BALLN SAPPHIRE ~~LOC~~ 3.25X10 (BALLOONS) ×2 IMPLANT
BALLOON SAPPHIRE 2.0X12 (BALLOONS) ×1 IMPLANT
BALLOON SAPPHIRE 2.5X15 (BALLOONS) ×1 IMPLANT
CATH OPTITORQUE TIG 4.0 5F (CATHETERS) ×2 IMPLANT
CATH VISTA GUIDE 6FR XBLAD3.5 (CATHETERS) ×2 IMPLANT
DEVICE RAD COMP TR BAND LRG (VASCULAR PRODUCTS) ×2 IMPLANT
ELECT DEFIB PAD ADLT CADENCE (PAD) ×2 IMPLANT
GLIDESHEATH SLEND SS 6F .021 (SHEATH) ×2 IMPLANT
GUIDEWIRE INQWIRE 1.5J.035X260 (WIRE) ×1 IMPLANT
INQWIRE 1.5J .035X260CM (WIRE) ×2
KIT ENCORE 26 ADVANTAGE (KITS) ×2 IMPLANT
KIT HEART LEFT (KITS) ×2 IMPLANT
PACK CARDIAC CATHETERIZATION (CUSTOM PROCEDURE TRAY) ×2 IMPLANT
SHEATH PROBE COVER 6X72 (BAG) ×2 IMPLANT
STENT RESOLUTE ONYX 3.0X15 (Permanent Stent) ×2 IMPLANT
TRANSDUCER W/STOPCOCK (MISCELLANEOUS) ×2 IMPLANT
TUBING CIL FLEX 10 FLL-RA (TUBING) ×2 IMPLANT
WIRE COUGAR XT STRL 190CM (WIRE) ×2 IMPLANT

## 2019-10-22 NOTE — Progress Notes (Signed)
ANTICOAGULATION CONSULT NOTE - Initial Consult  Pharmacy Consult for heparin Indication: chest pain/ACS  Allergies  Allergen Reactions  . Shrimp [Shellfish Allergy] Itching    Rash and lips itching  . Ace Inhibitors     Cough  . Aspirin Nausea And Vomiting  . Azithromycin Rash    Patient Measurements: Height: 5\' 6"  (167.6 cm) Weight: 190 lb 0.6 oz (86.2 kg) IBW/kg (Calculated) : 59.3 Heparin Dosing Weight: 77.7 kg  Vital Signs: Temp: 98.6 F (37 C) (11/26 2342) Temp Source: Oral (11/26 2342) BP: 139/81 (11/26 2342) Pulse Rate: 92 (11/26 2342)  Labs: Recent Labs    10/21/19 2353  HGB 14.5  HCT 45.3  PLT 360  CREATININE 2.03*  TROPONINIHS 13,910*    Estimated Creatinine Clearance: 30.6 mL/min (A) (by C-G formula based on SCr of 2.03 mg/dL (H)).   Medical History: Past Medical History:  Diagnosis Date  . Benign microscopic hematuria    neg cystoscopy 11/12- dr. Janice Norrie  . DJD (degenerative joint disease)    L KNEE  . Family history of anesthesia complication    multiple family members with history of this  . GERD (gastroesophageal reflux disease)   . Hurthle cell neoplasm of thyroid   . Hyperlipidemia   . Hypertension   . Malignant hyperthermia   . Numbness and tingling in left arm     Medications:  See medication history  Assessment: 63 yo lady to start heparin for ACS.  She was not on anticoagulation PTA  Hg 14.5, PTLC 360  Goal of Therapy:  Heparin level 0.3-0.7 units/ml Monitor platelets by anticoagulation protocol: Yes   Plan:  Heparin bolus 4000 units and drip at 1000 units/hr Check heparin level 6-8 hours after start Daily HL and cbc Monitor for bleeding complications  Thanks for allowing pharmacy to be a part of this patient's care.  Excell Seltzer, PharmD Clinical Pharmacist 10/22/2019,12:49 AM

## 2019-10-22 NOTE — Plan of Care (Signed)
  Problem: Education: Goal: Knowledge of General Education information will improve Description: Including pain rating scale, medication(s)/side effects and non-pharmacologic comfort measures Outcome: Progressing   Problem: Clinical Measurements: Goal: Cardiovascular complication will be avoided Outcome: Progressing   

## 2019-10-22 NOTE — ED Provider Notes (Addendum)
[facility] DEPART[facility] Provider Note   CSN: [id number] Arrival date & time: 10/21/19  2325     History   Chief Complaint Chief Complaint  Patient presents with  . Weakness    HPI Jade [name] is a 63 y.o. female.     The history is provided by the patient.  Weakness Severity:  Moderate Onset quality:  Gradual Duration:  4 days Timing:  Constant Progression:  Unchanged Chronicity:  New Context: not allergies, not change in medication and not decreased sleep   Relieved by:  Nothing Worsened by:  Nothing Ineffective treatments:  None tried Associated symptoms: anorexia, chest pain, nausea and vomiting   Associated symptoms: no abdominal pain, no aphasia, no arthralgias, no ataxia, no cough, no diarrhea, no difficulty walking, no dizziness, no drooling, no dysphagia, no dysuria, no numbness in extremities, no falls, no fever, no headaches, no loss of consciousness and no shortness of breath   Associated symptoms comment:  Originally patient said no to chest pain when asked but has been having "indigestion" for 3 weeks and was diagnosed with GERD by PMD.  But then upon re questioning she had left sided chest pain that radiated to her back on Tuesday but none since.  She has no focal neuro complaints, this is global weakness and has not felt like eating but has not had loss of taste or smell.  No diarrhea.  No f/c/r.  No leg pain.   Risk factors: no family hx of stroke     Past Medical History:  Diagnosis Date  . Benign microscopic hematuria    neg cystoscopy 11/12- dr. Janice Norrie  . DJD (degenerative joint disease)    L KNEE  . Family history of anesthesia complication    multiple family members with history of this  . GERD (gastroesophageal reflux disease)   . Hurthle cell neoplasm of thyroid   . Hyperlipidemia   . Hypertension   . Malignant hyperthermia   . Numbness and tingling in left arm     Patient Active Problem List   Diagnosis Date Noted   . ACS (acute coronary syndrome) ([place]) 10/22/2019  . Hurthle cell neoplasm of thyroid   . Multinodular goiter 01/26/2019  . 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 IBS (irritable bowel syndrome) 08/29/2014  . Pelvic pain in female 07/04/2014  . DJD (degenerative joint disease) of knee 01/20/2014  . Back pain 12/27/2013  . Dyslipidemia 12/27/2013  . Hot flashes 12/27/2013  . Daytime somnolence 07/28/2013  . Borderline diabetes 03/26/2013  . Knee pain, bilateral 03/26/2013  . Cervical disc disease 03/26/2013  . Allergic rhinitis 03/04/2012  . HTN (hypertension) 10/11/2011  . General medical examination 07/16/2011  . Tobacco abuse 07/16/2011  . Joint pain 07/16/2011  . Abnormal EKG 07/16/2011  . Microscopic hematuria 07/16/2011  . GERD (gastroesophageal reflux disease) 06/24/2011    Past Surgical History:  Procedure Laterality Date  . ABDOMINAL HYSTERECTOMY  1996  . BREAST EXCISIONAL BIOPSY Left 01/2016  . BREAST EXCISIONAL BIOPSY Left 03/2016  . CERVICAL DISC SURGERY  2013   Dr. Arnoldo Morale  . DILATION AND CURETTAGE OF UTERUS    . KNEE CARTILAGE SURGERY  1999   right knee  . ROTATOR CUFF REPAIR Right 05/25/13  . 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 TOTAL KNEE ARTHROPLASTY Right 01/19/2015   Procedure: RIGHT TOTAL KNEE  ARTHROPLASTY;  Surgeon: Johnn Hai, MD;  Location: [facility];  Service: Orthopedics;  Laterality: Right;  . TUBAL LIGATION       OB History   No obstetric history on file.      Home Medications    Prior to Admission medications   Medication Sig Start Date End Date Taking? Authorizing Provider  albuterol (PROVENTIL HFA;VENTOLIN HFA) 108 (90 Base) MCG/ACT inhaler Inhale 2 puffs into the lungs every 6 (six) hours as needed for wheezing or shortness of breath. 03/27/17   Shelda Pal, DO  albuterol (VENTOLIN HFA) 108 (90  Base) MCG/ACT inhaler Inhale 2 puffs into the lungs every 6 (six) hours as needed for wheezing or shortness of breath. 05/21/19   Debbrah Alar, NP  amLODipine (NORVASC) 5 MG tablet TAKE 1 TABLET (5 MG TOTAL) BY MOUTH DAILY. 07/19/19   Debbrah Alar, NP  fluticasone (FLONASE) 50 MCG/ACT nasal spray USE 2 SPRAYS IN EACH NOSTRIL EVERY DAY 01/06/18   Debbrah Alar, NP  gabapentin (NEURONTIN) 300 MG capsule Take 1 capsule by mouth daily in the morning, 1 at noon and 2 at bedtime. 04/13/19   Debbrah Alar, NP  LINZESS 145 MCG CAPS capsule TAKE 1 CAPSULE (145 MCG TOTAL) BY MOUTH DAILY. 07/05/16   Debbrah Alar, NP  methocarbamol (ROBAXIN) 500 MG tablet Take 1 tablet (500 mg total) by mouth 2 (two) times daily as needed. 10/13/19   Debbrah Alar, NP  oxyCODONE-acetaminophen (PERCOCET) 7.5-325 MG tablet Take 1 tablet by mouth at bedtime as needed. 10/13/19   Debbrah Alar, NP  pantoprazole (PROTONIX) 40 MG tablet Take 1 tablet (40 mg total) by mouth 2 (two) times daily. TAKE 1 TABLET BY MOUTH ONCE DAILY 10/13/19   Debbrah Alar, NP  potassium chloride SA (K-DUR,KLOR-CON) 20 MEQ tablet TAKE 1 TABLET (20 MEQ TOTAL) 2 (TWO) TIMES DAILY  04/09/18   Debbrah Alar, NP    Family History Family History  Problem Relation Age of Onset  . Hypertension Mother   . Hyperlipidemia Mother   . Heart disease Father        CAD; stent at age 52  . Diabetes Father   . Hypertension Father   . Hyperlipidemia Father   . Cancer Father        prostate  . Dementia Father   . Cancer Paternal Aunt        BREAST  . Breast cancer Paternal Aunt     Social History Social History   Tobacco Use  . Smoking status: Current Every Day Smoker    Packs/day: 1.00    Years: 39.00    Pack years: 39.00    Types: Cigarettes  . Smokeless tobacco: Never Used  Substance Use Topics  . Alcohol use: No    Alcohol/week: 0.0 standard drinks  . Drug use: No     Allergies   Shrimp  [shellfish allergy], Ace inhibitors, Aspirin, and Azithromycin   Review of Systems Review of Systems  Constitutional: Negative for fever.  HENT: Negative for drooling.   Respiratory: Negative for cough, chest tightness and shortness of breath.   Cardiovascular: Positive for chest pain. Negative for palpitations and leg swelling.  Gastrointestinal: Positive for anorexia, nausea and vomiting. Negative for abdominal pain, diarrhea and dysphagia.  Genitourinary: Negative for dysuria.  Musculoskeletal: Negative for arthralgias and falls.  Neurological: Positive for weakness. Negative for dizziness, loss of consciousness and headaches.  Psychiatric/Behavioral: Negative for agitation.  All other systems reviewed and are negative.    Physical Exam  Updated Vital Signs BP 132/83   Pulse 90   Temp 98.6 F (37 C) (Oral)   Resp (!) 22   Ht _0  (1.676 m)   Wt 86.2 kg   LMP 11/25/1994   SpO2 97%   BMI 30.67 kg/m   Physical Exam Vitals signs and nursing note reviewed.  Constitutional:      Appearance: She is normal weight. She is not diaphoretic.  HENT:     Head: Normocephalic and atraumatic.     Nose: Nose normal.  Eyes:     Conjunctiva/sclera: Conjunctivae normal.     Pupils: Pupils are equal, round, and reactive to light.  Neck:     Musculoskeletal: Normal range of motion and neck supple.  Cardiovascular:     Rate and Rhythm: Normal rate and regular rhythm.     Pulses: Normal pulses.     Heart sounds: Normal heart sounds.  Pulmonary:     Effort: Pulmonary effort is normal.     Breath sounds: Normal breath sounds.  Abdominal:     General: Abdomen is flat. Bowel sounds are normal.     Tenderness: There is no abdominal tenderness. There is no guarding or rebound.  Musculoskeletal: Normal range of motion.  Skin:    General: Skin is warm and dry.     Capillary Refill: Capillary refill takes less than 2 seconds.  Neurological:     General: No focal deficit present.      Mental Status: She is alert and oriented to person, place, and time.     Cranial Nerves: No cranial nerve deficit.     Deep Tendon Reflexes: Reflexes normal.  Psychiatric:        Mood and Affect: Mood normal.        Behavior: Behavior normal.      ED Treatments / Results  Labs (all labs ordered are listed, but only abnormal results are displayed) Results for orders placed or performed during the hospital encounter of 10/21/19  SARS Coronavirus 2 Ag (30 min TAT) - Nasal Swab (BD Veritor Kit)   Specimen: Nasal Swab (BD Veritor Kit)  Result Value Ref Range   SARS Coronavirus 2 Ag NEGATIVE NEGATIVE  CBC with Differential  Result Value Ref Range   WBC 15.4 (H) 4.0 - 10.5 K/uL   RBC 5.54 (H) 3.87 - 5.11 MIL/uL   Hemoglobin 14.5 12.0 - 15.0 g/dL   HCT 45.3 36.0 - 46.0 %   MCV 81.8 80.0 - 100.0 fL   MCH 26.2 26.0 - 34.0 pg   MCHC 32.0 30.0 - 36.0 g/dL   RDW 14.0 11.5 - 15.5 %   Platelets 360 150 - 400 K/uL   nRBC 0.0 0.0 - 0.2 %   Neutrophils Relative % 62 %   Neutro Abs 9.5 (H) 1.7 - 7.7 K/uL   Lymphocytes Relative 23 %   Lymphs Abs 3.5 0.7 - 4.0 K/uL   Monocytes Relative 15 %   Monocytes Absolute 2.3 (H) 0.1 - 1.0 K/uL   Eosinophils Relative 0 %   Eosinophils Absolute 0.0 0.0 - 0.5 K/uL   Basophils Relative 0 %   Basophils Absolute 0.0 0.0 - 0.1 K/uL   Immature Granulocytes 0 %   Abs Immature Granulocytes 0.05 0.00 - 0.07 K/uL  Comprehensive metabolic panel  Result Value Ref Range   Sodium 130 (L) 135 - 145 mmol/L   Potassium 3.3 (L) 3.5 - 5.1 mmol/L   Chloride 94 (L) 98 - 111 mmol/L   CO2  25 22 - 32 mmol/L   Glucose, Bld 111 (H) 70 - 99 mg/dL   BUN 32 (H) 8 - 23 mg/dL   Creatinine, Ser 2.03 (H) 0.44 - 1.00 mg/dL   Calcium 9.8 8.9 - 10.3 mg/dL   Total Protein 8.2 (H) 6.5 - 8.1 g/dL   Albumin 4.4 3.5 - 5.0 g/dL   AST 36 15 - 41 U/L   ALT 20 0 - 44 U/L   Alkaline Phosphatase 85 38 - 126 U/L   Total Bilirubin 0.6 0.3 - 1.2 mg/dL   GFR calc non Af Amer 25 (L) >60  mL/min   GFR calc Af Amer 29 (L) >60 mL/min   Anion gap 11 5 - 15  Magnesium  Result Value Ref Range   Magnesium 2.6 (H) 1.7 - 2.4 mg/dL  Troponin I (High Sensitivity)  Result Value Ref Range   Troponin I (High Sensitivity) 13,910 (HH) <18 ng/L   Dg Chest Portable 1 View  Result Date: 10/22/2019 CLINICAL DATA:  Chest pain EXAM: PORTABLE CHEST 1 VIEW COMPARISON:  01/13/2015 FINDINGS: The heart size and mediastinal contours are within normal limits. Both lungs are clear. The visualized skeletal structures are unremarkable. Postsurgical changes are noted in the cervical spine IMPRESSION: No active disease. Electronically Signed   By: Inez Catalina M.D.   On: 10/22/2019 01:15    EKG EKG Interpretation  Date/Time:  Friday October 22 2019 00:01:48 EST Ventricular Rate:  87 PR Interval:    QRS Duration: 85 QT Interval:  359 QTC Calculation: 432 R Axis:   -62 Text Interpretation: Sinus rhythm Biatrial enlargement Left anterior fascicular block Probable anterior infarct, age indeterminate Confirmed by Dory Horn) on 10/22/2019 12:38:32 AM   Radiology Dg Chest Portable 1 View  Result Date: 10/22/2019 CLINICAL DATA:  Chest pain EXAM: PORTABLE CHEST 1 VIEW COMPARISON:  01/13/2015 FINDINGS: The heart size and mediastinal contours are within normal limits. Both lungs are clear. The visualized skeletal structures are unremarkable. Postsurgical changes are noted in the cervical spine IMPRESSION: No active disease. Electronically Signed   By: Inez Catalina M.D.   On: 10/22/2019 01:15    Procedures Procedures (including critical care time)  Medications Ordered in ED Medications  heparin ADULT infusion 100 units/mL (25000 units/23m sodium chloride 0.45%) (1,000 Units/hr Intravenous Transfusing/Transfer 10/22/19 0557)  0.9 %  sodium chloride infusion ( Intravenous Transfusing/Transfer 10/22/19 0557)  nitroGLYCERIN (NITROSTAT) SL tablet 0.4 mg (0.4 mg Sublingual Given by EMS  10/22/19 0558)  fentaNYL (SUBLIMAZE) injection 50 mcg (has no administration in time range)  ondansetron (ZOFRAN) injection 4 mg (4 mg Intravenous Given 10/22/19 0012)  alum & mag hydroxide-simeth (MAALOX/MYLANTA) 200-200-20 MG/5ML suspension 30 mL (30 mLs Oral Given 10/22/19 0012)  heparin bolus via infusion 4,000 Units (4,000 Units Intravenous Bolus from Bag 10/22/19 0114)  potassium chloride 10 mEq in 100 mL IVPB (0 mEq Intravenous Stopped 10/22/19 0539)  aspirin chewable tablet 324 mg (324 mg Oral Given 10/22/19 0127)     Initial Impression / Assessment and Plan / ED Course  Case d/w Cardiology who will admit patient to SDU for ACS.  Aspirin if not true allergic reaction, continue heparin.  Please give potassium and check a magnesium level.  Agrees with gentle hydration given renal insufficiency.  Cardiology does not want cepheid swab at this time   Cannot get CTA due to renal insufficiency.  I doubt PE as patient denies all SOB and her pain episode has resolved.  I also doubt her O2  saturation would be 100% 4 days after a large PE.    Jade [name] was evaluated in Emergency Department on 10/22/2019 for the symptoms described in the history of present illness. She was evaluated in the context of the global COVID-19 pandemic, which necessitated consideration that the patient might be at risk for infection with the SARS-CoV-2 virus that causes COVID-19. Institutional protocols and algorithms that pertain to the evaluation of patients at risk for COVID-19 are in a state of rapid change based on information released by regulatory bodies including the CDC and federal and state organizations. These policies and algorithms were followed during the patient's care in the ED.  MDM Reviewed previous: labs Interpretation: labs, ECG and x-ray (No stemi, markedly elevated troponin, renal insufficiency, hypokalemi and hyponatremia.  NACPD on CXR ) Total time providing critical care: 75-105 minutes  (heparin drip and K riders ). This excludes time spent performing separately reportable procedures and services. Consults: cardiology  CRITICAL CARE Performed by: Appolonia Ackert K Adamarys Shall-Rasch Total critical care time: 75  minutes Critical care time was exclusive of separately billable procedures and treating other patients. Critical care was necessary to treat or prevent imminent or life-threatening deterioration. Critical care was time spent personally by me on the following activities: development of treatment plan with patient and/or surrogate as well as nursing, discussions with consultants, evaluation of patient's response to treatment, examination of patient, obtaining history from patient or surrogate, ordering and performing treatments and interventions, ordering and review of laboratory studies, ordering and review of radiographic studies, pulse oximetry and re-evaluation of patient's condition.    Patient was pain free until just prior to transfer when pain returned.  She was given SL NTG and fentanyl was ordered.    Final Clinical Impressions(s) / ED Diagnoses   Final diagnoses:  ACS (acute coronary syndrome) ([place])  Hypokalemia  Acute renal insufficiency  Hyponatremia    Admit to cardiology SDU   Elye Harmsen, MD 10/22/19 6720    Randal Buba, Salene Mohamud, MD 10/22/19 9470

## 2019-10-22 NOTE — Progress Notes (Signed)
  Echocardiogram 2D Echocardiogram has been performed.  Jade [name] 10/22/2019, 8:46 AM

## 2019-10-22 NOTE — Progress Notes (Signed)
Procedural staff getting ready to get patient onto bed and noticed a hematoma to right arm extending up the forearm. BP cuff placed on arm and inflated to 210-220. Pt will be going to holding area at this time instead of the floor for now to monitor her arm. Marya Amsler, RN on [facility] notified. Angiomax d/c'd per Dr. Claiborne Billings.

## 2019-10-22 NOTE — Progress Notes (Signed)
Cuff on rt forearm at 120mmhg d/t hematoma rt forearm

## 2019-10-22 NOTE — Progress Notes (Signed)
BP cuff on rt forearm at 162mmhg; rt hand and fingers slightly cool, brisk capillary refill, sensation present, palpable rt radial. Pleth damponed. Rt arm elevated above heart . Pain less after fentanyl

## 2019-10-22 NOTE — Progress Notes (Signed)
BP cuff on R arm decreased to 29mmhg, RN noted change of color to Rt Hand, Still cool, and pleth damponed.  Patient complaining of feeling numb.  Hematoma below cuff feels softer.  Debbie RN made aware.

## 2019-10-22 NOTE — Progress Notes (Signed)
Rt lower forearm level 1, not as firm. No growth in hematoma. Keeping rt arm elevated.

## 2019-10-22 NOTE — ED Notes (Addendum)
Pt c/o chest pain upon [facility] arrival. MD made aware. Nitro given by [employer].

## 2019-10-22 NOTE — ED Notes (Signed)
Report called to [facility]. [facility] en route

## 2019-10-22 NOTE — ED Notes (Signed)
Critical results received; troponin 13,910; Dr Randal Buba aware.

## 2019-10-22 NOTE — H&P (Addendum)
Cardiology Admission History and Physical:   Patient ID: Jade [name] MRN: [id number]; DOB: 10/26/1956   Admission date: 10/21/2019  Primary Care Provider: Debbrah Alar, NP Primary Cardiologist: Previously seen by Dr. Stanford Breed in 2014. Primary Electrophysiologist:  None   Chief Complaint:  Chest pain, vomiting, generalized weakness  Patient Profile:   Jade [name] is a 63 y.o. female with hypertension, hyperlipidemia, GERD who is admitted with NSTEMI.  History of Present Illness:   Jade [name] is a 63 year old female with a history of hypertension, hyperlipidemia, and GERD. Patient seen by Dr. Stanford Breed in 09/2013 for evaluation of abnormal EKG and pre-operative evaluation for knee replacement. EKG prior to visit showed inferior and lateral T wave changes. However, EKG at that visit showed non-specific ST/T changes. Myoview was ordered and came back low risk. Patient has not been seen by Cardiology since that time.  Patient was seen by PCP on 10/13/2019 for concerns about GERD. She reported feeling like food gets stuck in her esophagus and also reported waking up with burning pain in her esophagus. She reported taking Protonix x2 weeks with no improvement. PPI was continued and patient was referred to GI for further evaluation.   Patient presented to the [facility] ED yesterday for generalized weakness and vomiting. She was found to have an elevated high-sensitivity troponin that peaked at 13,910. EKG showed normal sinus rhythm with mild T wave inversion in leads I and aVL and biphasic T waves in V2-V6. WBC 15.4, Hgb 14.5, Plts 360. Na 130, K 3.3, Glucose 111, BUN 32, Cr 2.03. Mg 2.6. COVID-19 negative. Patient transferred to [facility] for admission and further work-up of NSTEMI.  At the time of this evaluation, patient resting comfortably. She reports reflux symptoms for 2-3 weeks as well as intermittent chest substernal chest pain as well as left arm pain mostly  at night. Symptoms non-exertional. She also notes some shortness of breath with activity. No orthopnea or PND. Occasional lower extremity edema if she has been on her feet all day. She notes some palpitations if she is being more active or if she is "excited." She has been very weak and had poor PO intake the last few days. She vomited 3 times on Tuesday. She also notes some lightheadedness and dizziness the last couple of days but denies any falls or syncope. No fevers or recent illnesses. No abnormal bleeding.   Patient has smoked since she was 16 years ago and currently smokes 1 pack per day. She denies any alcohol or recreational drug use. She thinks her father had a history of CAD and had stents placed but is not 100% sure. No other known family history of cardiovascular disease.  Heart Pathway Score:     Past Medical History:  Diagnosis Date  . Benign microscopic hematuria    neg cystoscopy 11/12- dr. Janice Norrie  . DJD (degenerative joint disease)    L KNEE  . Family history of anesthesia complication    multiple family members with history of this  . GERD (gastroesophageal reflux disease)   . Hurthle cell neoplasm of thyroid   . Hyperlipidemia   . Hypertension   . Malignant hyperthermia   . Numbness and tingling in left arm     Past Surgical History:  Procedure Laterality Date  . ABDOMINAL HYSTERECTOMY  1996  . BREAST EXCISIONAL BIOPSY Left 01/2016  . BREAST EXCISIONAL BIOPSY Left 03/2016  . CERVICAL DISC SURGERY  2013   Dr. Arnoldo Morale  . DILATION  AND CURETTAGE OF UTERUS    . KNEE CARTILAGE SURGERY  1999   right knee  . ROTATOR CUFF REPAIR Right 05/25/13  . 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 TOTAL KNEE ARTHROPLASTY Right 01/19/2015   Procedure: RIGHT TOTAL KNEE ARTHROPLASTY;  Surgeon: Johnn Hai, MD;  Location: [facility];  Service: Orthopedics;   Laterality: Right;  . TUBAL LIGATION       Medications Prior to Admission: Prior to Admission medications   Medication Sig Start Date End Date Taking? Authorizing Provider  albuterol (PROVENTIL HFA;VENTOLIN HFA) 108 (90 Base) MCG/ACT inhaler Inhale 2 puffs into the lungs every 6 (six) hours as needed for wheezing or shortness of breath. 03/27/17   Shelda Pal, DO  albuterol (VENTOLIN HFA) 108 (90 Base) MCG/ACT inhaler Inhale 2 puffs into the lungs every 6 (six) hours as needed for wheezing or shortness of breath. 05/21/19   Debbrah Alar, NP  amLODipine (NORVASC) 5 MG tablet TAKE 1 TABLET (5 MG TOTAL) BY MOUTH DAILY. 07/19/19   Debbrah Alar, NP  fluticasone (FLONASE) 50 MCG/ACT nasal spray USE 2 SPRAYS IN EACH NOSTRIL EVERY DAY 01/06/18   Debbrah Alar, NP  gabapentin (NEURONTIN) 300 MG capsule Take 1 capsule by mouth daily in the morning, 1 at noon and 2 at bedtime. 04/13/19   Debbrah Alar, NP  LINZESS 145 MCG CAPS capsule TAKE 1 CAPSULE (145 MCG TOTAL) BY MOUTH DAILY. 07/05/16   Debbrah Alar, NP  methocarbamol (ROBAXIN) 500 MG tablet Take 1 tablet (500 mg total) by mouth 2 (two) times daily as needed. 10/13/19   Debbrah Alar, NP  oxyCODONE-acetaminophen (PERCOCET) 7.5-325 MG tablet Take 1 tablet by mouth at bedtime as needed. 10/13/19   Debbrah Alar, NP  pantoprazole (PROTONIX) 40 MG tablet Take 1 tablet (40 mg total) by mouth 2 (two) times daily. TAKE 1 TABLET BY MOUTH ONCE DAILY 10/13/19   Debbrah Alar, NP  potassium chloride SA (K-DUR,KLOR-CON) 20 MEQ tablet TAKE 1 TABLET (20 MEQ TOTAL) 2 (TWO) TIMES DAILY  04/09/18   Debbrah Alar, NP     Allergies:    Allergies  Allergen Reactions  . Shrimp [Shellfish Allergy] Itching    Rash and lips itching  . Ace Inhibitors     Cough  . Aspirin Nausea And Vomiting  . Azithromycin Rash    Social History:   Social History   Socioeconomic History  . Marital status: Married     Spouse name: Not on file  . Number of children: 3  . Years of education: Not on file  . Highest education level: Not on file  Occupational History  . Not on file  Social Needs  . Financial resource strain: Not on file  . Food insecurity    Worry: Not on file    Inability: Not on file  . Transportation needs    Medical: Not on file    Non-medical: Not on file  Tobacco Use  . Smoking status: Current Every Day Smoker    Packs/day: 1.00    Years: 39.00    Pack years: 39.00    Types: Cigarettes  . Smokeless tobacco: Never Used  Substance and Sexual Activity  . Alcohol use: No    Alcohol/week: 0.0 standard drinks  . Drug use: No  . Sexual activity: Yes    Partners: Male  Lifestyle  . Physical activity  Days per week: Not on file    Minutes per session: Not on file  . Stress: Not on file  Relationships  . Social Herbalist on phone: Not on file    Gets together: Not on file    Attends religious service: Not on file    Active member of club or organization: Not on file    Attends meetings of clubs or organizations: Not on file    Relationship status: Not on file  . Intimate partner violence    Fear of current or ex partner: Not on file    Emotionally abused: Not on file    Physically abused: Not on file    Forced sexual activity: Not on file  Other Topics Concern  . Not on file  Social History Narrative   Regular exercise:  No   Caffeine Use:  2 cups coffee and 3-4 glasses of soda daily.   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    Family History:   The patient's family history includes Breast cancer in her paternal aunt; Cancer in her father and paternal aunt; Dementia in her father; Diabetes in her father; Heart disease in her father; Hyperlipidemia in her father and mother; Hypertension in her father and mother.    ROS:  Please see the history of present illness.  Review of Systems   Constitutional: Positive for malaise/fatigue. Negative for fever.  HENT: Negative for congestion.   Respiratory: Positive for shortness of breath. Negative for cough and sputum production.   Cardiovascular: Positive for chest pain and leg swelling. Negative for orthopnea and PND.  Gastrointestinal: Positive for vomiting. Negative for blood in stool and melena.  Genitourinary: Negative for hematuria.  Musculoskeletal: Positive for back pain (chronic). Negative for falls.  Neurological: Positive for dizziness and weakness. Negative for loss of consciousness.  Endo/Heme/Allergies: Does not bruise/bleed easily.  Psychiatric/Behavioral: Positive for substance abuse.    Physical Exam/Data:   Vitals:   10/22/19 0430 10/22/19 0515 10/22/19 0524 10/22/19 0642  BP: 118/66 117/68 117/68 123/84  Pulse: 83 78 76 91  Resp: 12 (!) 22 20 20   Temp:   98 F (36.7 C) 98.4 F (36.9 C)  TempSrc:    Oral  SpO2: 99% 100% 100% 95%  Weight:    82.4 kg  Height:    5\' 6"  (1.676 m)   No intake or output data in the 24 hours ending 10/22/19 0656 Last 3 Weights 10/22/2019 10/22/2019 10/13/2019  Weight (lbs) 181 lb 10.5 oz 190 lb 0.6 oz 190 lb  Weight (kg) 82.4 kg 86.2 kg 86.183 kg     Body mass index is 29.32 kg/m.  General: 63 y.o. African-American female resting comfortably in no acute distress. HEENT: Normocephalic and atraumatic. Sclera clear. EOMs intact. Neck: Supple. No carotid bruits. No JVD. Heart: RRR. Distinct S1 and S2. No murmurs, gallops, or rubs. Radial and distal pedal pulses 2+ and equal bilaterally. Lungs: No increased work of breathing. Clear to ausculation bilaterally. No wheezes, rhonchi, or rales.  Abdomen: Soft, non-distended, and non-tender to palpation. Bowel sounds present in all 4 quadrants.  MSK: Normal strength and tone for age. Extremities: No clubbing, cyanosis, or edema.    Skin: Warm and dry. Neuro: Alert and oriented x3. No focal deficits. Psych: Normal affect.  Responds appropriately.  EKG:  The ECG that was done  was personally reviewed and demonstrates normal sinus rhythm  with biatrial enlargement, LAFB, LVH, mild T wave inversions in lead I and AVL and biphasic T waves in V2-V6.  Relevant CV Studies: N/A  Laboratory Data:  High Sensitivity Troponin:   Recent Labs  Lab 10/21/19 2353 10/22/19 0211  TROPONINIHS 13,910* 11,904*      Chemistry Recent Labs  Lab 10/21/19 2353  NA 130*  K 3.3*  CL 94*  CO2 25  GLUCOSE 111*  BUN 32*  CREATININE 2.03*  CALCIUM 9.8  GFRNONAA 25*  GFRAA 29*  ANIONGAP 11    Recent Labs  Lab 10/21/19 2353  PROT 8.2*  ALBUMIN 4.4  AST 36  ALT 20  ALKPHOS 85  BILITOT 0.6   Hematology Recent Labs  Lab 10/21/19 2353  WBC 15.4*  RBC 5.54*  HGB 14.5  HCT 45.3  MCV 81.8  MCH 26.2  MCHC 32.0  RDW 14.0  PLT 360   BNPNo results for input(s): BNP, PROBNP in the last 168 hours.  DDimer No results for input(s): DDIMER in the last 168 hours.   Radiology/Studies:  Dg Chest Portable 1 View  Result Date: 10/22/2019 CLINICAL DATA:  Chest pain EXAM: PORTABLE CHEST 1 VIEW COMPARISON:  01/13/2015 FINDINGS: The heart size and mediastinal contours are within normal limits. Both lungs are clear. The visualized skeletal structures are unremarkable. Postsurgical changes are noted in the cervical spine IMPRESSION: No active disease. Electronically Signed   By: Inez Catalina M.D.   On: 10/22/2019 01:15    Assessment and Plan:   NSTEMI - High-sensitivity troponin peaked in 13,000s. - EKG shows possible prior anterior infarct with biphasic T waves in V2-V6 and mild T wave inversions in leads I and aVL. - Continue Heparin drip. - Will get stat Echo.  - Will check hemoglobin A1c and fasting lipid panel. - Continue Aspirin. Will start Lopressor 12.5mg  twice daily and Lipitor 80mg  daily. - Plan is for left heart catheterization later today if creatinine has improved. The patient understands that risks include  but are not limited to stroke (1 in 1000), death (1 in 69), kidney failure [usually temporary] (1 in 500), bleeding (1 in 200), allergic reaction [possibly serious] (1 in 200), and agrees to proceed.   Hypertension - BP currently well controlled. - OK to continue home Amlodipine 5mg  for now but medications changes may be made following cath.  Hyperlipidemia - Patient has history of hyperlipidemia but states she has not been on any medications for this. - Will check lipid panel. - Will go ahead and start Lipitor 80mg  daily.  AKI - Creatinine 2.03 on admission. Likely due to poor PO intake. - Repeat 1.50 after fluids. - Continue daily BMET.  Leukocytosis - WBC 15.4 on admission. Repeat 13.6 this morning. - Afebrile and no signs of infection. - COVID-19 negative.  - Repeat CBC tomorrow morning.  Hypokalemia - Mild hypokalemia on admission with potassium of 3.3. - Repleted and 3.6 on repeat. - Continue to monitor.  Tobacco  Abuse - Patient has smoked since the age of 17 and currently smokes 1 pack per day. - Will need to continue to encourage complete cessation.  Severity of Illness: The appropriate patient status for this patient is INPATIENT. Inpatient status is judged to be reasonable and necessary in order to provide the required intensity of service to ensure the patient's safety. The patient's presenting symptoms, physical exam findings, and initial radiographic and laboratory data in the context of their chronic comorbidities is felt to place them at high risk for further  clinical deterioration. Furthermore, it is not anticipated that the patient will be medically stable for discharge from the hospital within 2 midnights of admission. The following factors support the patient status of inpatient.   " The patient's presenting symptoms include chest pain. " The worrisome physical exam findings as above " The initial radiographic and laboratory data are worrisome because of  elevated troponin. " The chronic co-morbidities include HTN, HLD.   * I certify that at the point of admission it is my clinical judgment that the patient will require inpatient hospital care spanning beyond 2 midnights from the point of admission due to high intensity of service, high risk for further deterioration and high frequency of surveillance required.*    For questions or updates, please contact [place] Please consult [email or web address] for contact info under        Signed, Eppie Gibson  10/22/2019 6:56 AM   Patient examined chart reviewed. Exam with overweight black female Clear lungs SEM on exam good radial pulse no edema. She has ? Recent anterior MI on ECG with elevated troponins. Cr this am is improved to 1.5 Have written for stat echo to assess EF before cath Cath this am notified. Continue beta blocker and high dose statin. Risks of cath including stroke , bleeding contrast reaction MI and need for emergency surgery discussed willing to proceed  Jenkins Rouge

## 2019-10-22 NOTE — Progress Notes (Signed)
C/O numbness and swelling rt hand and fingers. Fingers dusky and cool. BP cuff removed. Sensation present rt forearm and fingers rt hand. Color pink rt hand and fingers. Rt forearm next to band level 1, firm. Good pleth

## 2019-10-22 NOTE — ED Notes (Signed)
Pt sleeping at this time. NAD noted. Family at bedside.

## 2019-10-23 ENCOUNTER — Encounter (HOSPITAL_COMMUNITY): Payer: Self-pay | Admitting: Physician Assistant

## 2019-10-23 DIAGNOSIS — Z720 Tobacco use: Secondary | ICD-10-CM

## 2019-10-23 DIAGNOSIS — I10 Essential (primary) hypertension: Secondary | ICD-10-CM

## 2019-10-23 DIAGNOSIS — I5041 Acute combined systolic (congestive) and diastolic (congestive) heart failure: Secondary | ICD-10-CM

## 2019-10-23 DIAGNOSIS — E785 Hyperlipidemia, unspecified: Secondary | ICD-10-CM | POA: Diagnosis present

## 2019-10-23 HISTORY — DX: Hyperlipidemia, unspecified: E78.5

## 2019-10-23 LAB — CBC
HCT: 36.3 % (ref 36.0–46.0)
Hemoglobin: 11.9 g/dL — ABNORMAL LOW (ref 12.0–15.0)
MCH: 26.9 pg (ref 26.0–34.0)
MCHC: 32.8 g/dL (ref 30.0–36.0)
MCV: 81.9 fL (ref 80.0–100.0)
Platelets: 311 10*3/uL (ref 150–400)
RBC: 4.43 MIL/uL (ref 3.87–5.11)
RDW: 14 % (ref 11.5–15.5)
WBC: 10.6 10*3/uL — ABNORMAL HIGH (ref 4.0–10.5)
nRBC: 0 % (ref 0.0–0.2)

## 2019-10-23 LAB — BASIC METABOLIC PANEL
Anion gap: 11 (ref 5–15)
BUN: 17 mg/dL (ref 8–23)
CO2: 23 mmol/L (ref 22–32)
Calcium: 8.4 mg/dL — ABNORMAL LOW (ref 8.9–10.3)
Chloride: 104 mmol/L (ref 98–111)
Creatinine, Ser: 0.81 mg/dL (ref 0.44–1.00)
GFR calc Af Amer: >60 mL/min (ref >60)
GFR calc non Af Amer: >60 mL/min (ref >60)
Glucose, Bld: 97 mg/dL (ref 70–99)
Potassium: 3.9 mmol/L (ref 3.5–5.1)
Sodium: 138 mmol/L (ref 135–145)

## 2019-10-23 LAB — LIPID PANEL
Cholesterol: 204 mg/dL — ABNORMAL HIGH (ref 0–200)
HDL: 27 mg/dL — ABNORMAL LOW (ref >40)
LDL Cholesterol: 142 mg/dL — ABNORMAL HIGH (ref 0–99)
Total CHOL/HDL Ratio: 7.6 RATIO
Triglycerides: 177 mg/dL — ABNORMAL HIGH (ref <150)
VLDL: 35 mg/dL (ref 0–40)

## 2019-10-23 LAB — HIV ANTIBODY (ROUTINE TESTING W REFLEX): HIV Screen 4th Generation wRfx: NONREACTIVE

## 2019-10-23 MED ORDER — METOPROLOL TARTRATE 25 MG PO TABS: 25.0000 mg | ORAL_TABLET | Freq: Two times a day (BID) | ORAL | Status: DC

## 2019-10-23 MED ORDER — AMLODIPINE BESYLATE 2.5 MG PO TABS: 2.5000 mg | ORAL_TABLET | Freq: Every day | ORAL | Status: DC

## 2019-10-23 MED ORDER — CARVEDILOL 12.5 MG PO TABS
12.5000 mg | ORAL_TABLET | Freq: Two times a day (BID) | ORAL | Status: DC
  Administered 2019-10-23 – 2019-10-26 (×5): 12.5 mg via ORAL
  Filled 2019-10-23 (×5): qty 1

## 2019-10-23 NOTE — Progress Notes (Signed)
SRCARDIAC REHAB PHASE I   PRE:  Rate/Rhythm: 82 Sr  BP:  Sitting: 121/67        SaO2: 97% RA  MODE:  Ambulation: 230 ft   POST:  Rate/Rhythm: 97 SR  BP:  Sitting:130/71      SaO2: 97 RA  Pt ambulated 230 ft. With one assist. Pt denied SOB, CP, or dizziness. Helped Pt wash up in room when walk was finished. Pt was educated on restrictions, incision site care, risk factors, exercise guidelines, NTG use, MI booklet, nutrition, stent procedure, smoking cessation, and CRP II. Pt referred to CRP II [place]. Pt in bed with call bell and phone within reach.   [place], ACSM CEP 10/23/2019  9:27 AM/

## 2019-10-23 NOTE — Progress Notes (Signed)
Progress Note  Patient Name: Jade [name] Date of Encounter: 10/23/2019  Primary Cardiologist: PN saw, f/u [facility] w/ BC  Kirk Ruths, MD  Subjective   No CP or SOB, some R forearm swelling, not painful Ambulated and did well  Inpatient Medications    Scheduled Meds: . amLODipine  5 mg Oral Daily  . aspirin EC  81 mg Oral Daily  . atorvastatin  80 mg Oral q1800  . fentaNYL (SUBLIMAZE) injection  50 mcg Intravenous Once  . gabapentin  300 mg Oral BID  . gabapentin  600 mg Oral QHS  . isosorbide mononitrate  30 mg Oral Daily  . metoprolol tartrate  12.5 mg Oral BID  . pantoprazole  40 mg Oral BID  . potassium chloride SA  20 mEq Oral BID  . sodium chloride flush  3 mL Intravenous Q12H  . ticagrelor  90 mg Oral BID   Continuous Infusions: . sodium chloride 90 mL/hr at 10/22/19 1709  . sodium chloride    . sodium chloride     PRN Meds: sodium chloride, acetaminophen, diazepam, methocarbamol, nitroGLYCERIN, ondansetron (ZOFRAN) IV, sodium chloride flush   Vital Signs    Vitals:   10/23/19 0332 10/23/19 0346 10/23/19 0400 10/23/19 0401  BP: 126/66     Pulse: 79     Resp:      Temp:    98.6 F (37 C)  TempSrc:    Oral  SpO2:  95%    Weight:   83.1 kg   Height:        Intake/Output Summary (Last 24 hours) at 10/23/2019 0904 Last data filed at 10/23/2019 0434 Gross per 24 hour  Intake 990.42 ml  Output 700 ml  Net 290.42 ml   Filed Weights   10/22/19 0002 10/22/19 0642 10/23/19 0400  Weight: 86.2 kg 82.4 kg 83.1 kg   Last Weight  Most recent update: 10/23/2019  4:00 AM   Weight  83.1 kg (183 lb 4.8 oz)           Weight change: -3.056 kg   Telemetry    SR, ST - Personally Reviewed  ECG    11/28 ECG is SR, HR 81, Q waves V1-4, LAFB, no sig change from 11/27  - Personally Reviewed  Physical Exam   General: Well developed, well nourished, female appearing in no acute distress. Head: Normocephalic, atraumatic.  Neck: Supple without bruits,  JVD not elevated. Lungs:  Resp regular and unlabored, CTA. Heart: RRR, S1, S2, no S3, S4, soft murmur; no rub. Abdomen: Soft, non-tender, non-distended with normoactive bowel sounds. No hepatomegaly. No rebound/guarding. No obvious abdominal masses. Extremities: No clubbing, cyanosis, mild R forearm edema. Distal pedal pulses are 2+ bilaterally. R radial cath site w/out ecchymosis or hematoma Neuro: Alert and oriented X 3. Moves all extremities spontaneously. Psych: Normal affect.  Labs    Hematology Recent Labs  Lab 10/21/19 2353 10/22/19 0709 10/23/19 0341  WBC 15.4* 13.6* 10.6*  RBC 5.54* 5.47* 4.43  HGB 14.5 14.4 11.9*  HCT 45.3 44.7 36.3  MCV 81.8 81.7 81.9  MCH 26.2 26.3 26.9  MCHC 32.0 32.2 32.8  RDW 14.0 13.9 14.0  PLT 360 338 311    Chemistry Recent Labs  Lab 10/21/19 2353 10/22/19 0709 10/23/19 0341  NA 130* 137 138  K 3.3* 3.6 3.9  CL 94* 96* 104  CO2 25 25 23   GLUCOSE 111* 103* 97  BUN 32* 29* 17  CREATININE 2.03* 1.50* 0.81  CALCIUM 9.8  9.2 8.4*  PROT 8.2*  --   --   ALBUMIN 4.4  --   --   AST 36  --   --   ALT 20  --   --   ALKPHOS 85  --   --   BILITOT 0.6  --   --   GFRNONAA 25* 36* >60  GFRAA 29* 42* >60  ANIONGAP 11 16* 11     High Sensitivity Troponin:   Recent Labs  Lab 10/21/19 2353 10/22/19 0211  TROPONINIHS 13,910* 11,904*     Lab Results  Component Value Date   CHOL 204 (H) 10/23/2019   HDL 27 (L) 10/23/2019   LDLCALC 142 (H) 10/23/2019   LDLDIRECT 175.0 08/10/2018   TRIG 177 (H) 10/23/2019   CHOLHDL 7.6 10/23/2019   Lab Results  Component Value Date   HGBA1C 6.0 (H) 10/22/2019   Lab Results  Component Value Date   TSH 1.97 01/26/2019     BNPNo results for input(s): BNP, PROBNP in the last 168 hours.    Radiology    Dg Chest Portable 1 View  Result Date: 10/22/2019 CLINICAL DATA:  Chest pain EXAM: PORTABLE CHEST 1 VIEW COMPARISON:  01/13/2015 FINDINGS: The heart size and mediastinal contours are within  normal limits. Both lungs are clear. The visualized skeletal structures are unremarkable. Postsurgical changes are noted in the cervical spine IMPRESSION: No active disease. Electronically Signed   By: Inez Catalina M.D.   On: 10/22/2019 01:15     Cardiac Studies   CARDIAC CATH: 10/22/2019  RPDA lesion is 95% stenosed.  RPAV lesion is 90% stenosed.  Mid Cx lesion is 50% stenosed.  Dist Cx lesion is 20% stenosed.  Prox LAD to Mid LAD lesion is 30% stenosed.  Mid LAD lesion is 99% stenosed.  Mid LAD to Dist LAD lesion is 30% stenosed.  Dist LAD lesion is 20% stenosed.  Mid RCA lesion is 20% stenosed.  Dist RCA lesion is 15% stenosed.  Post intervention, there is a 0% residual stenosis.  A stent was successfully placed.   Significant multivessel native CAD with culprit vessel being the LAD in the patient's acute coronary syndrome presentation.  The LAD is a large-caliber vessel that has 30% proximal stenosis after the first bifurcating diagonal vessel.  There is a 99% mid LAD stenosis after prominent septal perforating artery followed by more distal 30 and 20% stenoses.  Initial TIMI flow was TIMI I.  Left circumflex vessel had 50% mid AV groove stenosis after the takeoff of the marginal vessel followed by 20% stenosis; the RCA was a dominant vessel that had mild irregularity with 20% mid narrowing and a 90-95% bifurcation stenosis involving the ostium of the PDA and the continuation branch.  LVEDP 14 mmHg.  Successful percutaneous coronary prevention to the subtotal 99% LAD stenosis treated with PTCA/DES stenting with a 3.0 x 15 mm Resolute Onyx DES stent postdilated to 3.25 mm with a 99% stenosis being reduced to 0% and TIMI I flow being improved to TIMI-3 flow.  RECOMMENDATION: DAPT therapy for minimum of 1 year.  Antianginal medication for concomitant CAD.  Hydrate aggressively postprocedure.  Plan for staged complex PCI to the RCA bifurcation stenosis involving the PDA  and continuation branch and several days depending upon renal function.  Smoking cessation is imperative.  High potency statin therapy and LDL less than 70 and preferably in the 50s or below. Intervention     ECHO:  10/22/2019  1. Left ventricular ejection fraction,  by visual estimation, is 30 to 35%. The left ventricle has moderately decreased function. There is mildly increased left ventricular hypertrophy.  2. The mid anterior wall into the apex is severely hypokinetic/akinetic consistent with LAD infarction.  3. Mid and apical anterior septum, apical anterior segment, apical inferior segment, and apex are abnormal.  4. Left ventricular diastolic parameters are consistent with Grade I diastolic dysfunction (impaired relaxation).  5. The left ventricle demonstrates regional wall motion abnormalities.  6. Global right ventricle has normal systolic function.The right ventricular size is normal. No increase in right ventricular wall thickness.  7. Left atrial size was normal.  8. Right atrial size was normal.  9. Presence of pericardial fat pad. 10. The pericardial effusion is circumferential. 11. Trivial pericardial effusion is present. 12. The mitral valve is grossly normal. Trace mitral valve regurgitation. No evidence of mitral stenosis. 13. The tricuspid valve is grossly normal. Tricuspid valve regurgitation is mild. 14. The aortic valve is tricuspid. Aortic valve regurgitation is not visualized. No evidence of aortic valve sclerosis or stenosis. 15. The pulmonic valve was grossly normal. Pulmonic valve regurgitation is not visualized. 16. Normal pulmonary artery systolic pressure. 17. The inferior vena cava is normal in size with greater than 50% respiratory variability, suggesting right atrial pressure of 3 mmHg.  Patient Profile     63 y.o. female w/ hx HTN, HLD, GERD, was admitted 11/27 w/ NSTEMI  Assessment & Plan    1. NSTEMI - s/p DES LAD, for staged PCI RCA bifurcation  lesion, depending on renal function, see cath report above - Renal function improved, possible PCI Monday - continue ASA, statin, Lopressor 12.5 mg bid, Imdur 30 mg qd  2. Hyperlipidemia - LDL high and HDL low - high-dose statin started this admit - goal LDL < 70  3. HTN - Norvasc 5 mg qd is home rx - Lopressor 12.5 mg bid added - Imdur 30 mg qd added - HR generally elevated, will decrease amlodipine and increase metoprolol  4.  Tobacco use - cessation advised  Principal Problem:   NSTEMI (non-ST elevated myocardial infarction) ([place]) Active Problems:   ACS (acute coronary syndrome) ([place])   Hyperlipidemia LDL goal <70    Jonetta Speak , PA-C 9:04 AM 10/23/2019 Pager: [phone]

## 2019-10-24 MED ORDER — SODIUM CHLORIDE 0.9% FLUSH
3.0000 mL | Freq: Two times a day (BID) | INTRAVENOUS | Status: DC
  Administered 2019-10-24 – 2019-10-25 (×2): 3 mL via INTRAVENOUS

## 2019-10-24 MED ORDER — SODIUM CHLORIDE 0.9 % IV SOLN: 250.0000 mL | INTRAVENOUS | Status: DC | PRN

## 2019-10-24 MED ORDER — SODIUM CHLORIDE 0.9% FLUSH: 3.0000 mL | INTRAVENOUS | Status: DC | PRN

## 2019-10-24 MED ORDER — SODIUM CHLORIDE 0.9 % IV SOLN
INTRAVENOUS | Status: DC
  Administered 2019-10-25: 06:00:00 via INTRAVENOUS

## 2019-10-24 NOTE — Progress Notes (Signed)
Pt had midsternal CP upon awakening.  Unable to rate.  No other s/s.  VSS.  CP relieved with 1 SL ntg.  No acute EKG changes noted.  Will cont to monitor closely.

## 2019-10-24 NOTE — Progress Notes (Signed)
Progress Note  Patient Name: Jade [name] Date of Encounter: 10/24/2019  Primary Cardiologist: Kirk Ruths, MD   Subjective   Feeling well.  Had chest discomfort this am that responded to NTG.  Notes that her urine is orange. No dysuria.   Inpatient Medications    Scheduled Meds:  aspirin EC  81 mg Oral Daily   atorvastatin  80 mg Oral q1800   carvedilol  12.5 mg Oral BID WC   fentaNYL (SUBLIMAZE) injection  50 mcg Intravenous Once   gabapentin  300 mg Oral BID   gabapentin  600 mg Oral QHS   isosorbide mononitrate  30 mg Oral Daily   pantoprazole  40 mg Oral BID   potassium chloride SA  20 mEq Oral BID   sodium chloride flush  3 mL Intravenous Q12H   ticagrelor  90 mg Oral BID   Continuous Infusions:  sodium chloride 90 mL/hr at 10/22/19 1709   sodium chloride     sodium chloride     PRN Meds: sodium chloride, acetaminophen, diazepam, methocarbamol, nitroGLYCERIN, ondansetron (ZOFRAN) IV, sodium chloride flush   Vital Signs    Vitals:   10/24/19 0424 10/24/19 0636 10/24/19 0649 10/24/19 0942  BP: 130/64 113/71 122/68 123/71  Pulse: 87     Resp: (!) 21 11 14 19   Temp: 99.1 F (37.3 C)     TempSrc: Oral     SpO2: 96%     Weight:      Height:        Intake/Output Summary (Last 24 hours) at 10/24/2019 1002 Last data filed at 10/24/2019 0600 Gross per 24 hour  Intake 603 ml  Output 1500 ml  Net -897 ml   Last 3 Weights 10/23/2019 10/22/2019 10/22/2019  Weight (lbs) 183 lb 4.8 oz 181 lb 10.5 oz 190 lb 0.6 oz  Weight (kg) 83.144 kg 82.4 kg 86.2 kg      Telemetry    Sinus rhythm - Personally Reviewed  ECG    n/a - Personally Reviewed  Physical Exam   GEN: No acute distress.   Neck: No JVD Cardiac: RRR, no murmurs, rubs, or gallops.  Respiratory: Clear to auscultation bilaterally. GI: Soft, nontender, non-distended  MS: No edema; No deformity. Neuro:  Nonfocal  Psych: Normal affect   Labs    High Sensitivity Troponin:    Recent Labs  Lab 10/21/19 2353 10/22/19 0211  TROPONINIHS 13,910* 11,904*      Chemistry Recent Labs  Lab 10/21/19 2353 10/22/19 0709 10/23/19 0341  NA 130* 137 138  K 3.3* 3.6 3.9  CL 94* 96* 104  CO2 25 25 23   GLUCOSE 111* 103* 97  BUN 32* 29* 17  CREATININE 2.03* 1.50* 0.81  CALCIUM 9.8 9.2 8.4*  PROT 8.2*  --   --   ALBUMIN 4.4  --   --   AST 36  --   --   ALT 20  --   --   ALKPHOS 85  --   --   BILITOT 0.6  --   --   GFRNONAA 25* 36* >60  GFRAA 29* 42* >60  ANIONGAP 11 16* 11     Hematology Recent Labs  Lab 10/21/19 2353 10/22/19 0709 10/23/19 0341  WBC 15.4* 13.6* 10.6*  RBC 5.54* 5.47* 4.43  HGB 14.5 14.4 11.9*  HCT 45.3 44.7 36.3  MCV 81.8 81.7 81.9  MCH 26.2 26.3 26.9  MCHC 32.0 32.2 32.8  RDW 14.0 13.9 14.0  PLT 360 338  311    BNPNo results for input(s): BNP, PROBNP in the last 168 hours.   DDimer No results for input(s): DDIMER in the last 168 hours.   Radiology    No results found.  Cardiac Studies   CARDIAC CATH: 10/22/2019  RPDA lesion is 95% stenosed.  RPAV lesion is 90% stenosed.  Mid Cx lesion is 50% stenosed.  Dist Cx lesion is 20% stenosed.  Prox LAD to Mid LAD lesion is 30% stenosed.  Mid LAD lesion is 99% stenosed.  Mid LAD to Dist LAD lesion is 30% stenosed.  Dist LAD lesion is 20% stenosed.  Mid RCA lesion is 20% stenosed.  Dist RCA lesion is 15% stenosed.  Post intervention, there is a 0% residual stenosis.  A stent was successfully placed.  Significant multivessel native CAD with culprit vessel being the LAD in the patient's acute coronary syndrome presentation.  The LAD is a large-caliber vessel that has 30% proximal stenosis after the first bifurcating diagonal vessel. There is a 99% mid LAD stenosis after prominent septal perforating artery followed by more distal 30 and 20% stenoses. Initial TIMI flow was TIMI I. Left circumflex vessel had 50% mid AV groove stenosis after the takeoff of the  marginal vessel followed by 20% stenosis; the RCA was a dominant vessel that had mild irregularity with 20% mid narrowing and a 90-95% bifurcation stenosis involving the ostium of the PDA and the continuation branch.  LVEDP 14 mmHg.  Successful percutaneous coronary prevention to the subtotal 99% LAD stenosis treated with PTCA/DES stenting with a 3.0 x 15 mm Resolute Onyx DES stent postdilated to 3.25 mm with a 99% stenosis being reduced to 0% and TIMI I flow being improved to TIMI-3 flow.  RECOMMENDATION: DAPT therapy for minimum of 1 year. Antianginal medication for concomitant CAD. Hydrate aggressively postprocedure. Plan for staged complex PCI to the RCA bifurcation stenosis involving the PDA and continuation branch and several days depending upon renal function. Smoking cessation is imperative. High potency statin therapy and LDL less than 70 and preferably in the 50s or below. Intervention     ECHO:  10/22/2019 1. Left ventricular ejection fraction, by visual estimation, is 30 to 35%. The left ventricle has moderately decreased function. There is mildly increased left ventricular hypertrophy. 2. The mid anterior wall into the apex is severely hypokinetic/akinetic consistent with LAD infarction. 3. Mid and apical anterior septum, apical anterior segment, apical inferior segment, and apex are abnormal. 4. Left ventricular diastolic parameters are consistent with Grade I diastolic dysfunction (impaired relaxation). 5. The left ventricle demonstrates regional wall motion abnormalities. 6. Global right ventricle has normal systolic function.The right ventricular size is normal. No increase in right ventricular wall thickness. 7. Left atrial size was normal. 8. Right atrial size was normal. 9. Presence of pericardial fat pad. 10. The pericardial effusion is circumferential. 11. Trivial pericardial effusion is present. 12. The mitral valve is grossly normal. Trace mitral  valve regurgitation. No evidence of mitral stenosis. 13. The tricuspid valve is grossly normal. Tricuspid valve regurgitation is mild. 14. The aortic valve is tricuspid. Aortic valve regurgitation is not visualized. No evidence of aortic valve sclerosis or stenosis. 15. The pulmonic valve was grossly normal. Pulmonic valve regurgitation is not visualized. 16. Normal pulmonary artery systolic pressure. 17. The inferior vena cava is normal in size with greater than 50% respiratory variability, suggesting right atrial pressure of 3 mmHg.  Patient Profile     Ms. Wolbeck is a 73F with hypertension, hyperlipidemia, tobacco  abuse, and GERD admitted with NSTEMI.  She was found to have severe disease in the LAD and distal RCA with moderate LCX disease.  She underwent PCI of the LAD and will have staged RCA PCI on Monday.  Assessment & Plan    # NSTEMI: She underwent LAD PCI on 11/27.  Will go for staged RCA PCI 11/30.  She had one episode of chest pain that responded to NTG.  Continue aspirin, carvedilol, ticagrelor and Imdur.  # Acute systolic and diastolic heart failure: LVEF 30-35%.  Severe hypokinesis/akinesisi of the anterior myocardium.  Echo was taken prior to PCI.  We will get a limited echo in the AM.  If LVEF remains <35% will discharge with Life Vest.  Continue carvedilol.  Will likely need to reduce the dose to be able to add ARB/ARNI tomorrow post cath.   # Essential hypertension:  Amlodipine stopped 2/2 new heart failure.  Continue carvedilol and add ARB/ARNI as above.  # AKI: Resolved.  # Tobacco abuse:  Advised cessation.  For questions or updates, please contact [place] Please consult [email or web address] for contact info under        Signed, Skeet Latch, MD  10/24/2019, 10:02 AM

## 2019-10-24 NOTE — H&P (View-Only) (Signed)
Progress Note  Patient Name: Jade [name] Date of Encounter: 10/24/2019  Primary Cardiologist: Kirk Ruths, MD   Subjective   Feeling well.  Had chest discomfort this am that responded to NTG.  Notes that her urine is orange. No dysuria.   Inpatient Medications    Scheduled Meds:  aspirin EC  81 mg Oral Daily   atorvastatin  80 mg Oral q1800   carvedilol  12.5 mg Oral BID WC   fentaNYL (SUBLIMAZE) injection  50 mcg Intravenous Once   gabapentin  300 mg Oral BID   gabapentin  600 mg Oral QHS   isosorbide mononitrate  30 mg Oral Daily   pantoprazole  40 mg Oral BID   potassium chloride SA  20 mEq Oral BID   sodium chloride flush  3 mL Intravenous Q12H   ticagrelor  90 mg Oral BID   Continuous Infusions:  sodium chloride 90 mL/hr at 10/22/19 1709   sodium chloride     sodium chloride     PRN Meds: sodium chloride, acetaminophen, diazepam, methocarbamol, nitroGLYCERIN, ondansetron (ZOFRAN) IV, sodium chloride flush   Vital Signs    Vitals:   10/24/19 0424 10/24/19 0636 10/24/19 0649 10/24/19 0942  BP: 130/64 113/71 122/68 123/71  Pulse: 87     Resp: (!) 21 11 14 19   Temp: 99.1 F (37.3 C)     TempSrc: Oral     SpO2: 96%     Weight:      Height:        Intake/Output Summary (Last 24 hours) at 10/24/2019 1002 Last data filed at 10/24/2019 0600 Gross per 24 hour  Intake 603 ml  Output 1500 ml  Net -897 ml   Last 3 Weights 10/23/2019 10/22/2019 10/22/2019  Weight (lbs) 183 lb 4.8 oz 181 lb 10.5 oz 190 lb 0.6 oz  Weight (kg) 83.144 kg 82.4 kg 86.2 kg      Telemetry    Sinus rhythm - Personally Reviewed  ECG    n/a - Personally Reviewed  Physical Exam   GEN: No acute distress.   Neck: No JVD Cardiac: RRR, no murmurs, rubs, or gallops.  Respiratory: Clear to auscultation bilaterally. GI: Soft, nontender, non-distended  MS: No edema; No deformity. Neuro:  Nonfocal  Psych: Normal affect   Labs    High Sensitivity Troponin:    Recent Labs  Lab 10/21/19 2353 10/22/19 0211  TROPONINIHS 13,910* 11,904*      Chemistry Recent Labs  Lab 10/21/19 2353 10/22/19 0709 10/23/19 0341  NA 130* 137 138  K 3.3* 3.6 3.9  CL 94* 96* 104  CO2 25 25 23   GLUCOSE 111* 103* 97  BUN 32* 29* 17  CREATININE 2.03* 1.50* 0.81  CALCIUM 9.8 9.2 8.4*  PROT 8.2*  --   --   ALBUMIN 4.4  --   --   AST 36  --   --   ALT 20  --   --   ALKPHOS 85  --   --   BILITOT 0.6  --   --   GFRNONAA 25* 36* >60  GFRAA 29* 42* >60  ANIONGAP 11 16* 11     Hematology Recent Labs  Lab 10/21/19 2353 10/22/19 0709 10/23/19 0341  WBC 15.4* 13.6* 10.6*  RBC 5.54* 5.47* 4.43  HGB 14.5 14.4 11.9*  HCT 45.3 44.7 36.3  MCV 81.8 81.7 81.9  MCH 26.2 26.3 26.9  MCHC 32.0 32.2 32.8  RDW 14.0 13.9 14.0  PLT 360 338  311    BNPNo results for input(s): BNP, PROBNP in the last 168 hours.   DDimer No results for input(s): DDIMER in the last 168 hours.   Radiology    No results found.  Cardiac Studies   CARDIAC CATH: 10/22/2019  RPDA lesion is 95% stenosed.  RPAV lesion is 90% stenosed.  Mid Cx lesion is 50% stenosed.  Dist Cx lesion is 20% stenosed.  Prox LAD to Mid LAD lesion is 30% stenosed.  Mid LAD lesion is 99% stenosed.  Mid LAD to Dist LAD lesion is 30% stenosed.  Dist LAD lesion is 20% stenosed.  Mid RCA lesion is 20% stenosed.  Dist RCA lesion is 15% stenosed.  Post intervention, there is a 0% residual stenosis.  A stent was successfully placed.  Significant multivessel native CAD with culprit vessel being the LAD in the patient's acute coronary syndrome presentation.  The LAD is a large-caliber vessel that has 30% proximal stenosis after the first bifurcating diagonal vessel. There is a 99% mid LAD stenosis after prominent septal perforating artery followed by more distal 30 and 20% stenoses. Initial TIMI flow was TIMI I. Left circumflex vessel had 50% mid AV groove stenosis after the takeoff of the  marginal vessel followed by 20% stenosis; the RCA was a dominant vessel that had mild irregularity with 20% mid narrowing and a 90-95% bifurcation stenosis involving the ostium of the PDA and the continuation branch.  LVEDP 14 mmHg.  Successful percutaneous coronary prevention to the subtotal 99% LAD stenosis treated with PTCA/DES stenting with a 3.0 x 15 mm Resolute Onyx DES stent postdilated to 3.25 mm with a 99% stenosis being reduced to 0% and TIMI I flow being improved to TIMI-3 flow.  RECOMMENDATION: DAPT therapy for minimum of 1 year. Antianginal medication for concomitant CAD. Hydrate aggressively postprocedure. Plan for staged complex PCI to the RCA bifurcation stenosis involving the PDA and continuation branch and several days depending upon renal function. Smoking cessation is imperative. High potency statin therapy and LDL less than 70 and preferably in the 50s or below. Intervention     ECHO:  10/22/2019 1. Left ventricular ejection fraction, by visual estimation, is 30 to 35%. The left ventricle has moderately decreased function. There is mildly increased left ventricular hypertrophy. 2. The mid anterior wall into the apex is severely hypokinetic/akinetic consistent with LAD infarction. 3. Mid and apical anterior septum, apical anterior segment, apical inferior segment, and apex are abnormal. 4. Left ventricular diastolic parameters are consistent with Grade I diastolic dysfunction (impaired relaxation). 5. The left ventricle demonstrates regional wall motion abnormalities. 6. Global right ventricle has normal systolic function.The right ventricular size is normal. No increase in right ventricular wall thickness. 7. Left atrial size was normal. 8. Right atrial size was normal. 9. Presence of pericardial fat pad. 10. The pericardial effusion is circumferential. 11. Trivial pericardial effusion is present. 12. The mitral valve is grossly normal. Trace mitral  valve regurgitation. No evidence of mitral stenosis. 13. The tricuspid valve is grossly normal. Tricuspid valve regurgitation is mild. 14. The aortic valve is tricuspid. Aortic valve regurgitation is not visualized. No evidence of aortic valve sclerosis or stenosis. 15. The pulmonic valve was grossly normal. Pulmonic valve regurgitation is not visualized. 16. Normal pulmonary artery systolic pressure. 17. The inferior vena cava is normal in size with greater than 50% respiratory variability, suggesting right atrial pressure of 3 mmHg.  Patient Profile     Ms. Camilleri is a 25F with hypertension, hyperlipidemia, tobacco  abuse, and GERD admitted with NSTEMI.  She was found to have severe disease in the LAD and distal RCA with moderate LCX disease.  She underwent PCI of the LAD and will have staged RCA PCI on Monday.  Assessment & Plan    # NSTEMI: She underwent LAD PCI on 11/27.  Will go for staged RCA PCI 11/30.  She had one episode of chest pain that responded to NTG.  Continue aspirin, carvedilol, ticagrelor and Imdur.  # Acute systolic and diastolic heart failure: LVEF 30-35%.  Severe hypokinesis/akinesisi of the anterior myocardium.  Echo was taken prior to PCI.  We will get a limited echo in the AM.  If LVEF remains <35% will discharge with Life Vest.  Continue carvedilol.  Will likely need to reduce the dose to be able to add ARB/ARNI tomorrow post cath.   # Essential hypertension:  Amlodipine stopped 2/2 new heart failure.  Continue carvedilol and add ARB/ARNI as above.  # AKI: Resolved.  # Tobacco abuse:  Advised cessation.  For questions or updates, please contact [place] Please consult [email or web address] for contact info under        Signed, Skeet Latch, MD  10/24/2019, 10:02 AM

## 2019-10-25 ENCOUNTER — Inpatient Hospital Stay (HOSPITAL_COMMUNITY): Payer: Medicare Other

## 2019-10-25 ENCOUNTER — Encounter (HOSPITAL_COMMUNITY)
Admission: EM | Disposition: A | Discharge status: Home / Self Care | Payer: Self-pay | Source: Home / Self Care | Attending: Cardiovascular Disease

## 2019-10-25 DIAGNOSIS — N179 Acute kidney failure, unspecified: Secondary | ICD-10-CM

## 2019-10-25 DIAGNOSIS — I340 Nonrheumatic mitral (valve) insufficiency: Secondary | ICD-10-CM

## 2019-10-25 DIAGNOSIS — I361 Nonrheumatic tricuspid (valve) insufficiency: Secondary | ICD-10-CM

## 2019-10-25 HISTORY — PX: CORONARY STENT INTERVENTION: CATH118234

## 2019-10-25 HISTORY — PX: CORONARY BALLOON ANGIOPLASTY: CATH118233

## 2019-10-25 LAB — BASIC METABOLIC PANEL WITH GFR
Anion gap: 10 (ref 5–15)
BUN: 11 mg/dL (ref 8–23)
CO2: 23 mmol/L (ref 22–32)
Calcium: 9 mg/dL (ref 8.9–10.3)
Chloride: 104 mmol/L (ref 98–111)
Creatinine, Ser: 0.8 mg/dL (ref 0.44–1.00)
GFR calc Af Amer: >60 mL/min
GFR calc non Af Amer: >60 mL/min
Glucose, Bld: 95 mg/dL (ref 70–99)
Potassium: 4.2 mmol/L (ref 3.5–5.1)
Sodium: 137 mmol/L (ref 135–145)

## 2019-10-25 LAB — CBC
HCT: 36.8 % (ref 36.0–46.0)
Hemoglobin: 11.7 g/dL — ABNORMAL LOW (ref 12.0–15.0)
MCH: 26.6 pg (ref 26.0–34.0)
MCHC: 31.8 g/dL (ref 30.0–36.0)
MCV: 83.6 fL (ref 80.0–100.0)
Platelets: 321 K/uL (ref 150–400)
RBC: 4.4 MIL/uL (ref 3.87–5.11)
RDW: 14 % (ref 11.5–15.5)
WBC: 9.1 K/uL (ref 4.0–10.5)
nRBC: 0 % (ref 0.0–0.2)

## 2019-10-25 LAB — ECHOCARDIOGRAM LIMITED
Height: 66 in
Weight: 2916.8 [oz_av]

## 2019-10-25 LAB — POCT ACTIVATED CLOTTING TIME: Activated Clotting Time: 577 seconds

## 2019-10-25 SURGERY — CORONARY STENT INTERVENTION
Anesthesia: LOCAL

## 2019-10-25 MED ORDER — FENTANYL CITRATE (PF) 100 MCG/2ML IJ SOLN
INTRAMUSCULAR | Status: DC | PRN
  Administered 2019-10-25 (×2): 25 ug via INTRAVENOUS

## 2019-10-25 MED ORDER — DIAZEPAM 5 MG PO TABS: 5.0000 mg | ORAL_TABLET | Freq: Four times a day (QID) | ORAL | Status: DC | PRN

## 2019-10-25 MED ORDER — LIDOCAINE HCL (PF) 1 % IJ SOLN
INTRAMUSCULAR | Status: DC | PRN
  Administered 2019-10-25: 15 mL

## 2019-10-25 MED ORDER — MIDAZOLAM HCL 2 MG/2ML IJ SOLN
INTRAMUSCULAR | Status: AC
  Filled 2019-10-25: qty 2

## 2019-10-25 MED ORDER — ONDANSETRON HCL 4 MG/2ML IJ SOLN
INTRAMUSCULAR | Status: AC
  Filled 2019-10-25: qty 2

## 2019-10-25 MED ORDER — ONDANSETRON HCL 4 MG/2ML IJ SOLN
4.0000 mg | Freq: Four times a day (QID) | INTRAMUSCULAR | Status: DC | PRN
  Administered 2019-10-25: 4 mg via INTRAVENOUS

## 2019-10-25 MED ORDER — BIVALIRUDIN BOLUS VIA INFUSION - CUPID
INTRAVENOUS | Status: DC | PRN
  Administered 2019-10-25: 62.025 mg via INTRAVENOUS

## 2019-10-25 MED ORDER — BIVALIRUDIN TRIFLUOROACETATE 250 MG IV SOLR
INTRAVENOUS | Status: AC
  Filled 2019-10-25: qty 250

## 2019-10-25 MED ORDER — ASPIRIN 81 MG PO CHEW: 81.0000 mg | CHEWABLE_TABLET | Freq: Every day | ORAL | Status: DC

## 2019-10-25 MED ORDER — LIDOCAINE HCL (PF) 1 % IJ SOLN
INTRAMUSCULAR | Status: AC
  Filled 2019-10-25: qty 30

## 2019-10-25 MED ORDER — LABETALOL HCL 5 MG/ML IV SOLN: 10.0000 mg | INTRAVENOUS | Status: AC | PRN

## 2019-10-25 MED ORDER — HEPARIN (PORCINE) IN NACL 1000-0.9 UT/500ML-% IV SOLN
INTRAVENOUS | Status: AC
  Filled 2019-10-25: qty 500

## 2019-10-25 MED ORDER — SODIUM CHLORIDE 0.9 % IV SOLN
INTRAVENOUS | Status: DC
  Administered 2019-10-26: 03:00:00 via INTRAVENOUS

## 2019-10-25 MED ORDER — ATORVASTATIN CALCIUM 80 MG PO TABS: 80.0000 mg | ORAL_TABLET | Freq: Every day | ORAL | Status: DC

## 2019-10-25 MED ORDER — FENTANYL CITRATE (PF) 100 MCG/2ML IJ SOLN
INTRAMUSCULAR | Status: AC
  Filled 2019-10-25: qty 2

## 2019-10-25 MED ORDER — SODIUM CHLORIDE 0.9 % IV SOLN: 250.0000 mL | INTRAVENOUS | Status: DC | PRN

## 2019-10-25 MED ORDER — HYDRALAZINE HCL 20 MG/ML IJ SOLN: 10.0000 mg | INTRAMUSCULAR | Status: AC | PRN

## 2019-10-25 MED ORDER — ASPIRIN 81 MG PO CHEW
81.0000 mg | CHEWABLE_TABLET | Freq: Every day | ORAL | Status: DC
  Administered 2019-10-26: 81 mg via ORAL
  Filled 2019-10-25: qty 1

## 2019-10-25 MED ORDER — TICAGRELOR 90 MG PO TABS: 90.0000 mg | ORAL_TABLET | Freq: Two times a day (BID) | ORAL | Status: DC

## 2019-10-25 MED ORDER — HEPARIN (PORCINE) IN NACL 1000-0.9 UT/500ML-% IV SOLN
INTRAVENOUS | Status: DC | PRN
  Administered 2019-10-25: 500 mL

## 2019-10-25 MED ORDER — ISOSORBIDE MONONITRATE ER 30 MG PO TB24: 30.0000 mg | ORAL_TABLET | Freq: Every day | ORAL | Status: DC

## 2019-10-25 MED ORDER — NITROGLYCERIN 1 MG/10 ML FOR IR/CATH LAB
INTRA_ARTERIAL | Status: DC | PRN
  Administered 2019-10-25: 100 ug via INTRACORONARY
  Administered 2019-10-25: 100 ug
  Administered 2019-10-25 (×2): 200 ug via INTRACORONARY
  Administered 2019-10-25: 100 ug via INTRACORONARY
  Administered 2019-10-25: 200 ug via INTRACORONARY

## 2019-10-25 MED ORDER — NITROGLYCERIN 1 MG/10 ML FOR IR/CATH LAB
INTRA_ARTERIAL | Status: AC
  Filled 2019-10-25: qty 10

## 2019-10-25 MED ORDER — SODIUM CHLORIDE 0.9 % IV SOLN
INTRAVENOUS | Status: AC | PRN
  Administered 2019-10-25: 1.75 mg/kg/h via INTRAVENOUS
  Administered 2019-10-25: 1.75 mg/kg/h

## 2019-10-25 MED ORDER — SODIUM CHLORIDE 0.9% FLUSH
3.0000 mL | Freq: Two times a day (BID) | INTRAVENOUS | Status: DC
  Administered 2019-10-25 – 2019-10-26 (×2): 3 mL via INTRAVENOUS

## 2019-10-25 MED ORDER — ACETAMINOPHEN 325 MG PO TABS: 650.0000 mg | ORAL_TABLET | ORAL | Status: DC | PRN

## 2019-10-25 MED ORDER — SODIUM CHLORIDE 0.9% FLUSH: 3.0000 mL | INTRAVENOUS | Status: DC | PRN

## 2019-10-25 MED ORDER — IOHEXOL 350 MG/ML SOLN
INTRAVENOUS | Status: DC | PRN
  Administered 2019-10-25: 325 mL

## 2019-10-25 MED ORDER — MIDAZOLAM HCL 2 MG/2ML IJ SOLN
INTRAMUSCULAR | Status: DC | PRN
  Administered 2019-10-25: 2 mg via INTRAVENOUS
  Administered 2019-10-25: 1 mg via INTRAVENOUS

## 2019-10-25 SURGICAL SUPPLY — 28 items
BALLN EMERGE MR 2.25X12 (BALLOONS) ×2
BALLN EMERGE MR 2.25X15 (BALLOONS) ×2
BALLN SAPPHIRE 2.5X12 (BALLOONS) ×2
BALLN SAPPHIRE ~~LOC~~ 2.75X12 (BALLOONS) ×2 IMPLANT
BALLN WOLVERINE 2.00X10 (BALLOONS) ×2
BALLN WOLVERINE 2.50X10 (BALLOONS) ×2
BALLOON EMERGE MR 2.25X12 (BALLOONS) ×1 IMPLANT
BALLOON EMERGE MR 2.25X15 (BALLOONS) ×1 IMPLANT
BALLOON SAPPHIRE 2.5X12 (BALLOONS) ×1 IMPLANT
BALLOON WOLVERINE 2.00X10 (BALLOONS) ×1 IMPLANT
BALLOON WOLVERINE 2.50X10 (BALLOONS) ×1 IMPLANT
CATH LAUNCHER 6FR JR4 (CATHETERS) ×2 IMPLANT
CATH VISTA GUIDE 6FR JR4 SH (CATHETERS) ×2 IMPLANT
GLIDESHEATH SLEND SS 6F .021 (SHEATH) IMPLANT
GUIDEWIRE INQWIRE 1.5J.035X260 (WIRE) IMPLANT
INQWIRE 1.5J .035X260CM (WIRE)
KIT ENCORE 26 ADVANTAGE (KITS) ×2 IMPLANT
KIT HEART LEFT (KITS) ×2 IMPLANT
PACK CARDIAC CATHETERIZATION (CUSTOM PROCEDURE TRAY) ×2 IMPLANT
SHEATH PINNACLE 6F 10CM (SHEATH) ×2 IMPLANT
STENT RESOLUTE ONYX 2.5X18 (Permanent Stent) ×2 IMPLANT
TRANSDUCER W/STOPCOCK (MISCELLANEOUS) ×2 IMPLANT
TUBING CIL FLEX 10 FLL-RA (TUBING) ×2 IMPLANT
WIRE ASAHI PROWATER 180CM (WIRE) ×2 IMPLANT
WIRE COUGAR XT STRL 190CM (WIRE) ×4 IMPLANT
WIRE EMERALD 3MM-J .035X150CM (WIRE) ×2 IMPLANT
WIRE PT2 MS 185 (WIRE) ×2 IMPLANT
WIRE SION BLUE 180 (WIRE) ×2 IMPLANT

## 2019-10-25 NOTE — Interval H&P Note (Signed)
Cath Lab Visit (complete for each Cath Lab visit)  Clinical Evaluation Leading to the Procedure:   ACS: Yes.    Non-ACS:    Anginal Classification: CCS III  Anti-ischemic medical therapy: Maximal Therapy (2 or more classes of medications)  Non-Invasive Test Results: No non-invasive testing performed  Prior CABG: No previous CABG      History and Physical Interval Note:  10/25/2019 12:37 PM  Jade [name]  has presented today for surgery, with the diagnosis of CAD.  The various methods of treatment have been discussed with the patient and family. After consideration of risks, benefits and other options for treatment, the patient has consented to  Procedure(s): CORONARY STENT INTERVENTION (N/A) as a surgical intervention.  The patient's history has been reviewed, patient examined, no change in status, stable for surgery.  I have reviewed the patient's chart and labs.  Questions were answered to the patient's satisfaction.     Shelva Majestic

## 2019-10-25 NOTE — Progress Notes (Signed)
Site area: rt groin fa sheath Site Prior to Removal:  Level 0 Pressure Applied For: 20 minutes Manual:   yes Patient Status During Pull:  stable Post Pull Site:  Level 0 Post Pull Instructions Given:  yes Post Pull Pulses Present: rt dp palpable Dressing Applied:  Gauze and tegaderm Bedrest begins @ 1850 Comments:

## 2019-10-25 NOTE — Progress Notes (Signed)
  Echocardiogram 2D Echocardiogram has been performed.  Jade [name] 10/25/2019, 8:47 AM

## 2019-10-25 NOTE — Care Management (Signed)
Per Sharyn Blitz W/ [facility]._0 [phone]  Brilinta 30m twice a day Co-pay amount $45.00 for 30 day supply.  No PA required. Deductible not met. Tier 3  Medication Retail Pharmacies are: [facility]

## 2019-10-25 NOTE — Progress Notes (Addendum)
Progress Note  Patient Name: Jade [name] Date of Encounter: 10/25/2019  Primary Cardiologist: Kirk Ruths, MD   Subjective   Staged PCI RCA today. Denies CP. Had some SOB on exertion.   Inpatient Medications    Scheduled Meds: . aspirin EC  81 mg Oral Daily  . atorvastatin  80 mg Oral q1800  . carvedilol  12.5 mg Oral BID WC  . fentaNYL (SUBLIMAZE) injection  50 mcg Intravenous Once  . gabapentin  300 mg Oral BID  . gabapentin  600 mg Oral QHS  . isosorbide mononitrate  30 mg Oral Daily  . pantoprazole  40 mg Oral BID  . potassium chloride SA  20 mEq Oral BID  . sodium chloride flush  3 mL Intravenous Q12H  . sodium chloride flush  3 mL Intravenous Q12H  . ticagrelor  90 mg Oral BID   Continuous Infusions: . sodium chloride 90 mL/hr at 10/22/19 1709  . sodium chloride    . sodium chloride    . sodium chloride    . sodium chloride 10 mL/hr at 10/25/19 0554   PRN Meds: sodium chloride, sodium chloride, acetaminophen, diazepam, methocarbamol, nitroGLYCERIN, ondansetron (ZOFRAN) IV, sodium chloride flush, sodium chloride flush   Vital Signs    Vitals:   10/24/19 0942 10/24/19 1411 10/24/19 2055 10/25/19 0621  BP: 123/71 107/62 107/65 123/83  Pulse:  76 77 78  Resp: 19 18 (!) 25 (!) 22  Temp:  98.6 F (37 C) 99.8 F (37.7 C) 98.6 F (37 C)  TempSrc:  Oral Oral Oral  SpO2:   97% 100%  Weight:    82.7 kg  Height:        Intake/Output Summary (Last 24 hours) at 10/25/2019 0741 Last data filed at 10/25/2019 0545 Gross per 24 hour  Intake 723 ml  Output 1000 ml  Net -277 ml   Last 3 Weights 10/25/2019 10/23/2019 10/22/2019  Weight (lbs) 182 lb 4.8 oz 183 lb 4.8 oz 181 lb 10.5 oz  Weight (kg) 82.691 kg 83.144 kg 82.4 kg      Telemetry    NSR, HR 70-80s; no other arrhythmias noted - Personally Reviewed  ECG    No new - Personally Reviewed  Physical Exam   GEN: No acute distress.   Neck: No JVD Cardiac: RRR, no murmurs, rubs, or gallops.   Respiratory: Clear to auscultation bilaterally. GI: Soft, nontender, non-distended  MS: No edema; No deformity; right radial cath site is stable Neuro:  Nonfocal  Psych: Normal affect   Labs    High Sensitivity Troponin:   Recent Labs  Lab 10/21/19 2353 10/22/19 0211  TROPONINIHS 13,910* 11,904*      Chemistry Recent Labs  Lab 10/21/19 2353 10/22/19 0709 10/23/19 0341  NA 130* 137 138  K 3.3* 3.6 3.9  CL 94* 96* 104  CO2 25 25 23   GLUCOSE 111* 103* 97  BUN 32* 29* 17  CREATININE 2.03* 1.50* 0.81  CALCIUM 9.8 9.2 8.4*  PROT 8.2*  --   --   ALBUMIN 4.4  --   --   AST 36  --   --   ALT 20  --   --   ALKPHOS 85  --   --   BILITOT 0.6  --   --   GFRNONAA 25* 36* >60  GFRAA 29* 42* >60  ANIONGAP 11 16* 11     Hematology Recent Labs  Lab 10/21/19 2353 10/22/19 0709 10/23/19 0341  WBC 15.4* 13.6*  10.6*  RBC 5.54* 5.47* 4.43  HGB 14.5 14.4 11.9*  HCT 45.3 44.7 36.3  MCV 81.8 81.7 81.9  MCH 26.2 26.3 26.9  MCHC 32.0 32.2 32.8  RDW 14.0 13.9 14.0  PLT 360 338 311    BNPNo results for input(s): BNP, PROBNP in the last 168 hours.   DDimer No results for input(s): DDIMER in the last 168 hours.   Radiology    No results found.  Cardiac Studies   CARDIAC CATH: 10/22/2019  RPDA lesion is 95% stenosed.  RPAV lesion is 90% stenosed.  Mid Cx lesion is 50% stenosed.  Dist Cx lesion is 20% stenosed.  Prox LAD to Mid LAD lesion is 30% stenosed.  Mid LAD lesion is 99% stenosed.  Mid LAD to Dist LAD lesion is 30% stenosed.  Dist LAD lesion is 20% stenosed.  Mid RCA lesion is 20% stenosed.  Dist RCA lesion is 15% stenosed.  Post intervention, there is a 0% residual stenosis.  A stent was successfully placed.  Significant multivessel native CAD with culprit vessel being the LAD in the patient's acute coronary syndrome presentation.  The LAD is a large-caliber vessel that has 30% proximal stenosis after the first bifurcating diagonal vessel.  There is a 99% mid LAD stenosis after prominent septal perforating artery followed by more distal 30 and 20% stenoses. Initial TIMI flow was TIMI I. Left circumflex vessel had 50% mid AV groove stenosis after the takeoff of the marginal vessel followed by 20% stenosis; the RCA was a dominant vessel that had mild irregularity with 20% mid narrowing and a 90-95% bifurcation stenosis involving the ostium of the PDA and the continuation branch.  LVEDP 14 mmHg.  Successful percutaneous coronary prevention to the subtotal 99% LAD stenosis treated with PTCA/DES stenting with a 3.0 x 15 mm Resolute Onyx DES stent postdilated to 3.25 mm with a 99% stenosis being reduced to 0% and TIMI I flow being improved to TIMI-3 flow.  RECOMMENDATION: DAPT therapy for minimum of 1 year. Antianginal medication for concomitant CAD. Hydrate aggressively postprocedure. Plan for staged complex PCI to the RCA bifurcation stenosis involving the PDA and continuation branch and several days depending upon renal function. Smoking cessation is imperative. High potency statin therapy and LDL less than 70 and preferably in the 50s or below. Intervention     ECHO:10/22/2019 1. Left ventricular ejection fraction, by visual estimation, is 30 to 35%. The left ventricle has moderately decreased function. There is mildly increased left ventricular hypertrophy. 2. The mid anterior wall into the apex is severely hypokinetic/akinetic consistent with LAD infarction. 3. Mid and apical anterior septum, apical anterior segment, apical inferior segment, and apex are abnormal. 4. Left ventricular diastolic parameters are consistent with Grade I diastolic dysfunction (impaired relaxation). 5. The left ventricle demonstrates regional wall motion abnormalities. 6. Global right ventricle has normal systolic function.The right ventricular size is normal. No increase in right ventricular wall thickness. 7. Left atrial size was  normal. 8. Right atrial size was normal. 9. Presence of pericardial fat pad. 10. The pericardial effusion is circumferential. 11. Trivial pericardial effusion is present. 12. The mitral valve is grossly normal. Trace mitral valve regurgitation. No evidence of mitral stenosis. 13. The tricuspid valve is grossly normal. Tricuspid valve regurgitation is mild. 14. The aortic valve is tricuspid. Aortic valve regurgitation is not visualized. No evidence of aortic valve sclerosis or stenosis. 15. The pulmonic valve was grossly normal. Pulmonic valve regurgitation is not visualized. 16. Normal pulmonary artery systolic pressure.  17. The inferior vena cava is normal in size with greater than 50% respiratory variability, suggesting right atrial pressure of 3 mmHg.  Patient Profile     63 y.o. female is a 46F with hypertension, hyperlipidemia, tobacco abuse, and GERD admitted with NSTEMI. She was found to have severe disease in the LAD and distal RCA with moderate LCX disease. She underwent PCI of the LAD and will have staged RCA PCI on Monday  Assessment & Plan    NSTEMI Patient underwent LAD PCI 11/27. Today staged PCI to RCA  - she had CP over the weekend that responded to NTG. No further CP. - continue ASA, Brilinta, carvedilol 12.5 mg BID, Atorvastatin 80 mg, and Imdur 20 mg daily - will check labs this AM  Acute systolic and diastolic HF - LVED [id number] with severe hypokinesis/akinesis of the anterior myocardium (was before PCI)  - Limited echo today to reevaluate EF. If LVEF <35% plan to discharge with Life vest - continue BB. Might need lower dose in order to add ARB/ARNI  HTN - amlodipine stopped 2/2 to new low EF - continue carvedilol 12.5 mg BID - plan to add ARB/ARNI - BP 123/83 this AM  AKI - resolved - check labs this AM  Tobacco abuse - cessation recommended  For questions or updates, please contact [facility] Please consult [email or web address] for contact info under         Signed, Cadence Ninfa Meeker, PA-C  10/25/2019, 7:41 AM    ---------------------------------------------------------------------------------------------   History and all data above reviewed.  Patient examined.  I agree with the findings as above.  Adriana Simas plans for staged PCI to the RCA today. No chest pain this morning.   Constitutional: No acute distress Eyes: pupils equally round and reactive to light, sclera non-icteric, normal conjunctiva and lids ENMT: normal dentition, moist mucous membranes Cardiovascular: regular rhythm, normal rate, no murmurs. S1 and S2 normal. Radial pulses normal bilaterally. No jugular venous distention.  Respiratory: clear to auscultation bilaterally GI : normal bowel sounds, soft and nontender. No distention.   MSK: extremities warm, well perfused. No edema.  NEURO: grossly nonfocal exam, moves all extremities. PSYCH: alert and oriented x 3, normal mood and affect.   All available labs, radiology testing, previous records reviewed. Agree with documented assessment and plan of my colleague as stated above with the following additions or changes:  Principal Problem:   NSTEMI (non-ST elevated myocardial infarction) ([place]) Active Problems:   Tobacco abuse   Hypertension   Borderline diabetes   ACS (acute coronary syndrome) ([place])   Hyperlipidemia LDL goal <70   CAD in native artery s/p DES LAD, DES RCA 10/26/19   Ischemic cardiomyopathy   Weakness    Plan: plan for staged PCI to the RCA. Stop CCB in setting of low EF. Will add ARB.   Elouise Munroe, MD [facility]

## 2019-10-25 NOTE — Progress Notes (Signed)
CARDIAC REHAB PHASE I   PRE:  Rate/Rhythm: 72 SR  BP:  Supine: 120/68  Sitting:   Standing:    SaO2: 100%RA  MODE:  Ambulation: 200 ft   POST:  Rate/Rhythm: 84 SR  BP:  Supine:   Sitting: 120/65  Standing:    SaO2: 100%RA 0904-0932 Pt walked 200 ft on RA with asst x 1. Tired easily. No CP. Back to bed. Awaiting staged PCI.   Graylon Good, RN BSN  10/25/2019 9:29 AM

## 2019-10-26 ENCOUNTER — Encounter (HOSPITAL_COMMUNITY): Payer: Self-pay | Admitting: Cardiovascular Disease

## 2019-10-26 ENCOUNTER — Telehealth: Payer: Self-pay | Admitting: Cardiology

## 2019-10-26 DIAGNOSIS — I2510 Atherosclerotic heart disease of native coronary artery without angina pectoris: Secondary | ICD-10-CM

## 2019-10-26 DIAGNOSIS — I5032 Chronic diastolic (congestive) heart failure: Secondary | ICD-10-CM

## 2019-10-26 DIAGNOSIS — I255 Ischemic cardiomyopathy: Secondary | ICD-10-CM

## 2019-10-26 DIAGNOSIS — R531 Weakness: Secondary | ICD-10-CM

## 2019-10-26 LAB — CBC
HCT: 36.6 % (ref 36.0–46.0)
Hemoglobin: 11.6 g/dL — ABNORMAL LOW (ref 12.0–15.0)
MCH: 26.4 pg (ref 26.0–34.0)
MCHC: 31.7 g/dL (ref 30.0–36.0)
MCV: 83.2 fL (ref 80.0–100.0)
Platelets: 343 10*3/uL (ref 150–400)
RBC: 4.4 MIL/uL (ref 3.87–5.11)
RDW: 14.2 % (ref 11.5–15.5)
WBC: 8.4 10*3/uL (ref 4.0–10.5)
nRBC: 0 % (ref 0.0–0.2)

## 2019-10-26 LAB — BASIC METABOLIC PANEL
Anion gap: 16 — ABNORMAL HIGH (ref 5–15)
BUN: 10 mg/dL (ref 8–23)
CO2: 18 mmol/L — ABNORMAL LOW (ref 22–32)
Calcium: 9 mg/dL (ref 8.9–10.3)
Chloride: 104 mmol/L (ref 98–111)
Creatinine, Ser: 0.92 mg/dL (ref 0.44–1.00)
GFR calc Af Amer: >60 mL/min (ref >60)
GFR calc non Af Amer: >60 mL/min (ref >60)
Glucose, Bld: 94 mg/dL (ref 70–99)
Potassium: 4.4 mmol/L (ref 3.5–5.1)
Sodium: 138 mmol/L (ref 135–145)

## 2019-10-26 MED ORDER — ASPIRIN 81 MG PO CHEW: 81.0000 mg | CHEWABLE_TABLET | Freq: Every day | ORAL | 3 refills | Status: DC

## 2019-10-26 MED ORDER — TICAGRELOR 90 MG PO TABS: 90.0000 mg | ORAL_TABLET | Freq: Two times a day (BID) | ORAL | 3 refills | Status: DC

## 2019-10-26 MED ORDER — NITROGLYCERIN 0.4 MG SL SUBL: 0.4000 mg | SUBLINGUAL_TABLET | SUBLINGUAL | 1 refills | Status: DC | PRN

## 2019-10-26 MED ORDER — LOSARTAN POTASSIUM 25 MG PO TABS: 25.0000 mg | ORAL_TABLET | Freq: Every day | ORAL | 6 refills | Status: DC

## 2019-10-26 MED ORDER — ISOSORBIDE MONONITRATE ER 30 MG PO TB24: 30.0000 mg | ORAL_TABLET | Freq: Every day | ORAL | 6 refills | Status: DC

## 2019-10-26 MED ORDER — LOSARTAN POTASSIUM 25 MG PO TABS
25.0000 mg | ORAL_TABLET | Freq: Every day | ORAL | Status: DC
  Administered 2019-10-26: 25 mg via ORAL
  Filled 2019-10-26: qty 1

## 2019-10-26 MED ORDER — ATORVASTATIN CALCIUM 80 MG PO TABS: 80.0000 mg | ORAL_TABLET | Freq: Every day | ORAL | 6 refills | Status: DC

## 2019-10-26 MED ORDER — CARVEDILOL 12.5 MG PO TABS: 12.5000 mg | ORAL_TABLET | Freq: Two times a day (BID) | ORAL | 6 refills | Status: DC

## 2019-10-26 MED FILL — ATORVASTATIN CALCIUM 80 MG: 80 | 30 days supply | Qty: 30 | Fill #0

## 2019-10-26 MED FILL — ISOSORBIDE MN ER 30 MG TAB: 30 | 30 days supply | Qty: 30 | Fill #0

## 2019-10-26 MED FILL — NITROGLYCERIN 0.4 MG TAB SL: 0.4 | 8 days supply | Qty: 25 | Fill #0

## 2019-10-26 MED FILL — LOSARTAN POTASSIUM 25 MG TA: 25 | 30 days supply | Qty: 30 | Fill #0

## 2019-10-26 MED FILL — BRILINTA 90 MG TABLET: 90 | 30 days supply | Qty: 60 | Fill #0

## 2019-10-26 MED FILL — CARVEDILOL 12.5 MG TABLET: 12.5 | 30 days supply | Qty: 60 | Fill #0

## 2019-10-26 MED FILL — ASPIRIN LOW DOSE 81 MG CHEW: 81 | 90 days supply | Qty: 90 | Fill #0

## 2019-10-26 NOTE — Telephone Encounter (Signed)
Per Cadence Furth patient will be seen by Kerin Ransom on 11/10/19 at 8:30am

## 2019-10-26 NOTE — Telephone Encounter (Signed)
Patient currently admitted.  TOC call for tomorrow 12/02

## 2019-10-26 NOTE — Progress Notes (Signed)
CARDIAC REHAB PHASE I   PRE:  Rate/Rhythm: 75 SR  BP:  Supine: 127/73  Sitting:   Standing:    SaO2: 100%RA  MODE:  Ambulation: 470 ft   POST:  Rate/Rhythm: 86 SR  BP:  Supine:   Sitting: 131/75  Standing:    SaO2: 100%RA [id number] Pt feeling well. Walked 470 ft on RA with steady gait and tolerated better than our walk yesterday. Pt stated she could tell a difference in tolerance. No CP. Encouraged pt to take her brilinta, quit smoking, follow heart healthy diet and walk. Pt has educational materials and had received ed on Saturday. Pt stated she has quit smoking. Referral being sent to [facility] 2.    Graylon Good, RN BSN  10/26/2019 8:52 AM

## 2019-10-26 NOTE — Discharge Summary (Addendum)
Discharge Summary    Patient ID: Jade [name] MRN: [id number]; DOB: 06-10-1956  Admit date: 10/21/2019 Discharge date: 10/26/2019  Primary Care Provider: Debbrah Alar, NP  Primary Cardiologist: Kirk Ruths, MD Primary Electrophysiologist:  None   Discharge Diagnoses    Principal Problem:   NSTEMI (non-ST elevated myocardial infarction) [facility]) Active Problems:   Tobacco abuse   Hypertension   Borderline diabetes   ACS (acute coronary syndrome) ([place])   Hyperlipidemia LDL goal <70   CAD in native artery s/p DES LAD, DES RCA 10/26/19   Ischemic cardiomyopathy   Weakness   Diagnostic Studies/Procedures    CARDIAC CATH: 10/22/2019  RPDA lesion is 95% stenosed.  RPAV lesion is 90% stenosed.  Mid Cx lesion is 50% stenosed.  Dist Cx lesion is 20% stenosed.  Prox LAD to Mid LAD lesion is 30% stenosed.  Mid LAD lesion is 99% stenosed.  Mid LAD to Dist LAD lesion is 30% stenosed.  Dist LAD lesion is 20% stenosed.  Mid RCA lesion is 20% stenosed.  Dist RCA lesion is 15% stenosed.  Post intervention, there is a 0% residual stenosis.  A stent was successfully placed.  Significant multivessel native CAD with culprit vessel being the LAD in the patient's acute coronary syndrome presentation.  The LAD is a large-caliber vessel that has 30% proximal stenosis after the first bifurcating diagonal vessel. There is a 99% mid LAD stenosis after prominent septal perforating artery followed by more distal 30 and 20% stenoses. Initial TIMI flow was TIMI I. Left circumflex vessel had 50% mid AV groove stenosis after the takeoff of the marginal vessel followed by 20% stenosis; the RCA was a dominant vessel that had mild irregularity with 20% mid narrowing and a 90-95% bifurcation stenosis involving the ostium of the PDA and the continuation branch.  LVEDP 14 mmHg.  Successful percutaneous coronary prevention to the subtotal 99% LAD stenosis treated with PTCA/DES  stenting with a 3.0 x 15 mm Resolute Onyx DES stent postdilated to 3.25 mm with a 99% stenosis being reduced to 0% and TIMI I flow being improved to TIMI-3 flow.  RECOMMENDATION: DAPT therapy for minimum of 1 year. Antianginal medication for concomitant CAD. Hydrate aggressively postprocedure. Plan for staged complex PCI to the RCA bifurcation stenosis involving the PDA and continuation branch and several days depending upon renal function. Smoking cessation is imperative. High potency statin therapy and LDL less than 70 and preferably in the 50s or below. Intervention      Cardiac cath 10/25/19  RPDA lesion is 95% stenosed.  RPAV lesion is 90% stenosed.  Mid RCA lesion is 20% stenosed.  Post intervention, there is a 0% residual stenosis.  Dist RCA-2 lesion is 50% stenosed.  A stent was successfully placed.  Dist RCA-1 lesion is 15% stenosed.  Post intervention, there is a 0% residual stenosis.  Post intervention, there is a 30% residual stenosis.  A stent was successfully placed.  A stent was successfully placed.  Difficult but successful complex PCI involving the distal RCA and bifurcation of the ostial PDA and ostial PLA territory continuation branch utilizing Wolverine Cutting Balloon with ultimate DES stenting of the distal RCA into the PDA vessel with a 2.5T millimeter Resolute Onyx DES stent postdilated from 2.8 mm in the distal RCA to 2.1 mm at the ostium of the PDA with the stenoses being reduced to 0%; and Cutting Balloon and PTCA of the ostium of the PLA continuation branch with a 90% stenosis being reduced to approximately 30%.  RECOMMENDATION: DAPT therapy for minimum of a year. Smoking cessation is essential. Aggressive lipid-lowering therapy with target LDL less than 70. Optimal blood pressure control and GDMT for ischemic cardiomyopathy with plans for subsequent echo Doppler study assess for recovery of LV function following the patient's  interventional procedures.  ECHO:10/22/2019 1. Left ventricular ejection fraction, by visual estimation, is 30 to 35%. The left ventricle has moderately decreased function. There is mildly increased left ventricular hypertrophy. 2. The mid anterior wall into the apex is severely hypokinetic/akinetic consistent with LAD infarction. 3. Mid and apical anterior septum, apical anterior segment, apical inferior segment, and apex are abnormal. 4. Left ventricular diastolic parameters are consistent with Grade I diastolic dysfunction (impaired relaxation). 5. The left ventricle demonstrates regional wall motion abnormalities. 6. Global right ventricle has normal systolic function.The right ventricular size is normal. No increase in right ventricular wall thickness. 7. Left atrial size was normal. 8. Right atrial size was normal. 9. Presence of pericardial fat pad. 10. The pericardial effusion is circumferential. 11. Trivial pericardial effusion is present. 12. The mitral valve is grossly normal. Trace mitral valve regurgitation. No evidence of mitral stenosis. 13. The tricuspid valve is grossly normal. Tricuspid valve regurgitation is mild. 14. The aortic valve is tricuspid. Aortic valve regurgitation is not visualized. No evidence of aortic valve sclerosis or stenosis. 15. The pulmonic valve was grossly normal. Pulmonic valve regurgitation is not visualized. 16. Normal pulmonary artery systolic pressure. 17. The inferior vena cava is normal in size with greater than 50% respiratory variability, suggesting right atrial pressure of 3 mmHg. _____________   History of Present Illness     Jade [name] is a 63 y.o. female with hypertension, hyperlipidemia, tobacco abuse, and GERD who presented to the ED at [facility] 10/21/19 for generalized weakness and vomiting. 3 days prior she had vomited 3 times and had been feeling dizzy and lightheaded with decreased appetite and weakness. She also  reported intermittent substernal chest pain at times radiating into the left arm. CP was nonexertional. She also noted shortness of breath with activity. No orthopnea or PND. Occasional lower leg extremity edema. She was found to have elevated HS troponin that peaked at 13,910. EKG showed NSR with mild T wave inversions in leads I and aVL and biphasic T waves in V2-V6. WBC 15.4, Hgb 14.5, Plts 360, Na 130, L 3.3, Glucose 111, BUN 32, Cr 2.03, Mg 2.6. COVID negative. Patient was then transferred to [facility] for admission and further work-up for NSTEMI.  Hospital Course     Consultants: None  At the time of arrival the patient was comfortable on IV heparin. She reported long smoking history, 1 ppd and family history of CAD in her father. Stat echo showed low EF of 30-35% with G1DD, trivial pericardial effusion, mild TR.The patient was taken down for cardiac catheterization which showed significant native CAD (30% proximal LAD, 99% mid LAD, 50% LCx, 20% mid AV groove, 20% and 90-95% stenosis in 2 locations of the RCA) with culprit lesion being LAD. Patient was treated with DES to LAD with plans for staged PCI RCA in 2-3 days. Post procedure the patient was hydrated and started on DAPT with ASA and Brilinta. The right radial cath site remained clean and dry with no signs of hematoma or bruit. Lipid panel showed LDL 142, HDL 27, TG 177. Hemoglobin A1C was 6.0. Home amlodipine was stopped. She was started on high intensity statin, Lopressor, and Imdur. Lifestyle changes discussed with the patient such  as diet, exercise, medication compliance, and tobacco cessation. Metoprolol was switched to carvedilol. A repeat echo was ordered showing improved EF of 45%. If EF would have been less than 30% plan was for life vest at discharge. Cardiac rehab began to work with the patient and the patient was able to ambulate with no symptoms. Over the weekend the patient had one episode of chest pain that resolved with NTG. The  patient was taken back to the catheterization lab for staged PCI RCA. Right groin access was established and she underwent a difficult but successful PCI of the distal RCA and bifurcation of the ostial PDA and ostial PLA branch using Wolverine cutting balloon and DES stenting of the distal RCA into the PDA vessel. The patient remained stable throughout the procedure. DAPT was continued with Aspirin and Brilinta. Creatinine was stable and Losartan was added to her medication regimen with plans to switch to [facility]. Cardiac rehab saw the patient and noted the patient was able to better tolerate ambulation. Right groin site was stable with no hematoma or bruit. Patient denied recurrent CP or SOB. Tobacco cessation was once again reinforced. Will plan to discharge patient with DAPT Aspirin and Brilinta for 12 months. Stop amlodipine. New prescriptions for Coreg 12.5 mg BID, Losartan 25 mg daily (with plans to change to [facility] if tolerated), Atorvastatin 80 mg daily, and Imdur 30 mg daily sent in. [facility] hospital follow up was arranged.  The patient was evaluated by Dr. Margaretann Loveless 10/26/19 and felt to be stable for discharge.   Did the patient have an acute coronary syndrome (MI, NSTEMI, STEMI, etc) this admission?:  Yes                               AHA/ACC Clinical Performance & Quality Measures: 1. Aspirin prescribed? - Yes 2. ADP Receptor Inhibitor (Plavix/Clopidogrel, Brilinta/Ticagrelor or Effient/Prasugrel) prescribed (includes medically managed patients)? - Yes 3. Beta Blocker prescribed? - Yes 4. High Intensity Statin (Lipitor 40-80mg  or Crestor 20-40mg ) prescribed? - Yes 5. EF assessed during THIS hospitalization? - Yes 6. For EF <40%, was ACEI/ARB prescribed? - Not Applicable (EF >/= [id number]) 7. For EF <40%, Aldosterone Antagonist (Spironolactone or Eplerenone) prescribed? - Not Applicable (EF >/= [id number]) 8. Cardiac Rehab Phase II ordered (Included Medically managed Patients)? - Yes    _____________  Discharge Vitals Blood pressure 131/75, pulse 73, temperature (!) 97.5 F (36.4 C), temperature source Oral, resp. rate 12, height 5\' 6"  (1.676 m), weight 82.6 kg, last menstrual period 11/25/1994, SpO2 95 %.  Filed Weights   10/23/19 0400 10/25/19 0621 10/26/19 0530  Weight: 83.1 kg 82.7 kg 82.6 kg    Labs & Radiologic Studies    CBC Recent Labs    10/25/19 0814 10/26/19 0441  WBC 9.1 8.4  HGB 11.7* 11.6*  HCT 36.8 36.6  MCV 83.6 83.2  PLT 321 [id number]   Basic Metabolic Panel Recent Labs    10/25/19 0814 10/26/19 0441  NA 137 138  K 4.2 4.4  CL 104 104  CO2 23 18*  GLUCOSE 95 94  BUN 11 10  CREATININE 0.80 0.92  CALCIUM 9.0 9.0   Liver Function Tests No results for input(s): AST, ALT, ALKPHOS, BILITOT, PROT, ALBUMIN in the last 72 hours. No results for input(s): LIPASE, AMYLASE in the last 72 hours. High Sensitivity Troponin:   Recent Labs  Lab 10/21/19 2353 10/22/19 0211  TROPONINIHS 13,910* 11,904*    BNP Invalid  input(s): POCBNP D-Dimer No results for input(s): DDIMER in the last 72 hours. Hemoglobin A1C No results for input(s): HGBA1C in the last 72 hours. Fasting Lipid Panel No results for input(s): CHOL, HDL, LDLCALC, TRIG, CHOLHDL, LDLDIRECT in the last 72 hours. Thyroid Function Tests No results for input(s): TSH, T4TOTAL, T3FREE, THYROIDAB in the last 72 hours.  Invalid input(s): FREET3 _____________  Dg Chest Portable 1 View  Result Date: 10/22/2019 CLINICAL DATA:  Chest pain EXAM: PORTABLE CHEST 1 VIEW COMPARISON:  01/13/2015 FINDINGS: The heart size and mediastinal contours are within normal limits. Both lungs are clear. The visualized skeletal structures are unremarkable. Postsurgical changes are noted in the cervical spine IMPRESSION: No active disease. Electronically Signed   By: Inez Catalina M.D.   On: 10/22/2019 01:15   Disposition   Pt is being discharged home today in good condition.  Follow-up Plans & Appointments     Follow-up Information    [facility] Follow up on 11/03/2019.   Specialty: Cardiology Why: Please go to hospital follow up December 9th at 10:40 AM Contact information: [address], [place] [phone]         Discharge Instructions    Amb Referral to Cardiac Rehabilitation   Complete by: As directed    Diagnosis:  Coronary Stents NSTEMI     After initial evaluation and assessments completed: Virtual Based Care may be provided alone or in conjunction with Phase 2 Cardiac Rehab based on patient barriers.: Yes      Discharge Medications   Allergies as of 10/26/2019      Reactions   Shrimp [shellfish Allergy] Itching   Rash and lips itching   Ace Inhibitors Cough   Cough   Aspirin Nausea And Vomiting   Azithromycin Rash      Medication List    STOP taking these medications   amLODipine 5 MG tablet Commonly known as: NORVASC   ibuprofen 800 MG tablet Commonly known as: ADVIL     TAKE these medications   albuterol 108 (90 Base) MCG/ACT inhaler Commonly known as: VENTOLIN HFA Inhale 2 puffs into the lungs every 6 (six) hours as needed for wheezing or shortness of breath.   aspirin 81 MG chewable tablet Chew 1 tablet (81 mg total) by mouth daily. Start taking on: October 27, 2019   atorvastatin 80 MG tablet Commonly known as: LIPITOR Take 1 tablet (80 mg total) by mouth daily.   carvedilol 12.5 MG tablet Commonly known as: COREG Take 1 tablet (12.5 mg total) by mouth 2 (two) times daily with a meal.   fluticasone 50 MCG/ACT nasal spray Commonly known as: FLONASE USE 2 SPRAYS IN EACH NOSTRIL EVERY DAY   gabapentin 300 MG capsule Commonly known as: NEURONTIN Take 1 capsule by mouth daily in the morning, 1 at noon and 2 at bedtime. What changed:   how much to take  how to take this  when to take this  additional instructions   isosorbide mononitrate 30 MG 24 hr tablet Commonly known  as: IMDUR Take 1 tablet (30 mg total) by mouth daily. Start taking on: October 27, 2019   Linzess 145 MCG Caps capsule Generic drug: linaclotide Take 145 mcg by mouth daily before breakfast.   losartan 25 MG tablet Commonly known as: COZAAR Take 1 tablet (25 mg total) by mouth daily. Start taking on: October 27, 2019   methocarbamol 500 MG tablet Commonly known as: ROBAXIN Take 1 tablet (  500 mg total) by mouth 2 (two) times daily as needed. What changed: when to take this   nitroGLYCERIN 0.4 MG SL tablet Commonly known as: NITROSTAT Place 1 tablet (0.4 mg total) under the tongue every 5 (five) minutes as needed for chest pain (CP or SOB).   oxyCODONE-acetaminophen 7.5-325 MG tablet Commonly known as: PERCOCET Take 1 tablet by mouth at bedtime as needed. What changed: when to take this   pantoprazole 40 MG tablet Commonly known as: PROTONIX Take 1 tablet (40 mg total) by mouth 2 (two) times daily. TAKE 1 TABLET BY MOUTH ONCE DAILY What changed: additional instructions   potassium chloride SA 20 MEQ tablet Commonly known as: KLOR-CON TAKE 1 TABLET (20 MEQ TOTAL) 2 (TWO) TIMES DAILY What changed: See the new instructions.   ticagrelor 90 MG Tabs tablet Commonly known as: BRILINTA Take 1 tablet (90 mg total) by mouth 2 (two) times daily.          Outstanding Labs/Studies   N/A  Duration of Discharge Encounter   Greater than 30 minutes including physician time.  Signed, Shakara Tweedy Ninfa Meeker, PA-C 10/26/2019, 11:57 AM

## 2019-10-26 NOTE — Progress Notes (Addendum)
Progress Note  Patient Name: Jade [name] Date of Encounter: 10/26/2019  Primary Cardiologist: Kirk Ruths, MD   Subjective   Patient feels good this AM. No CP or SOB. Cath site, right groin is clean and dry. Patient walked with cardiac rehab yesterday and was asymptomatic.   Inpatient Medications    Scheduled Meds: . aspirin  81 mg Oral Daily  . atorvastatin  80 mg Oral q1800  . carvedilol  12.5 mg Oral BID WC  . fentaNYL (SUBLIMAZE) injection  50 mcg Intravenous Once  . gabapentin  300 mg Oral BID  . gabapentin  600 mg Oral QHS  . isosorbide mononitrate  30 mg Oral Daily  . pantoprazole  40 mg Oral BID  . potassium chloride SA  20 mEq Oral BID  . sodium chloride flush  3 mL Intravenous Q12H  . ticagrelor  90 mg Oral BID   Continuous Infusions: . sodium chloride 100 mL/hr at 10/26/19 0235  . sodium chloride     PRN Meds: sodium chloride, acetaminophen, diazepam, methocarbamol, nitroGLYCERIN, ondansetron (ZOFRAN) IV, sodium chloride flush   Vital Signs    Vitals:   10/25/19 2255 10/25/19 2355 10/26/19 0155 10/26/19 0530  BP: 125/72 105/74 113/65 121/78  Pulse: 71 74 73   Resp: 18 20 (!) 25 19  Temp:    (!) 97.5 F (36.4 C)  TempSrc:    Oral  SpO2:  95%    Weight:    82.6 kg  Height:        Intake/Output Summary (Last 24 hours) at 10/26/2019 0714 Last data filed at 10/26/2019 0532 Gross per 24 hour  Intake 1299.92 ml  Output 1000 ml  Net 299.92 ml   Last 3 Weights 10/26/2019 10/25/2019 10/23/2019  Weight (lbs) 182 lb 182 lb 4.8 oz 183 lb 4.8 oz  Weight (kg) 82.555 kg 82.691 kg 83.144 kg      Telemetry    NSR. HR 70s; no other arrhythmias noted - Personally Reviewed  ECG    NSR, 73bpm, possible LAFB, no changes  - Personally Reviewed  Physical Exam   GEN: No acute distress.   Neck: No JVD Cardiac: RRR, no murmurs, rubs, or gallops.  Respiratory: Clear to auscultation bilaterally. GI: Soft, nontender, non-distended  MS: No edema; No  deformity. Neuro:  Nonfocal  Psych: Normal affect   Labs    High Sensitivity Troponin:   Recent Labs  Lab 10/21/19 2353 10/22/19 0211  TROPONINIHS 13,910* 11,904*      Chemistry Recent Labs  Lab 10/21/19 2353  10/23/19 0341 10/25/19 0814 10/26/19 0441  NA 130*   < > 138 137 138  K 3.3*   < > 3.9 4.2 4.4  CL 94*   < > 104 104 104  CO2 25   < > 23 23 18*  GLUCOSE 111*   < > 97 95 94  BUN 32*   < > 17 11 10   CREATININE 2.03*   < > 0.81 0.80 0.92  CALCIUM 9.8   < > 8.4* 9.0 9.0  PROT 8.2*  --   --   --   --   ALBUMIN 4.4  --   --   --   --   AST 36  --   --   --   --   ALT 20  --   --   --   --   ALKPHOS 85  --   --   --   --  BILITOT 0.6  --   --   --   --   GFRNONAA 25*   < > >60 >60 >60  GFRAA 29*   < > >60 >60 >60  ANIONGAP 11   < > 11 10 16*   < > = values in this interval not displayed.     Hematology Recent Labs  Lab 10/23/19 0341 10/25/19 0814 10/26/19 0441  WBC 10.6* 9.1 8.4  RBC 4.43 4.40 4.40  HGB 11.9* 11.7* 11.6*  HCT 36.3 36.8 36.6  MCV 81.9 83.6 83.2  MCH 26.9 26.6 26.4  MCHC 32.8 31.8 31.7  RDW 14.0 14.0 14.2  PLT 311 321 343    BNPNo results for input(s): BNP, PROBNP in the last 168 hours.   DDimer No results for input(s): DDIMER in the last 168 hours.   Radiology    No results found.  Cardiac Studies   CARDIAC CATH: 10/22/2019  RPDA lesion is 95% stenosed.  RPAV lesion is 90% stenosed.  Mid Cx lesion is 50% stenosed.  Dist Cx lesion is 20% stenosed.  Prox LAD to Mid LAD lesion is 30% stenosed.  Mid LAD lesion is 99% stenosed.  Mid LAD to Dist LAD lesion is 30% stenosed.  Dist LAD lesion is 20% stenosed.  Mid RCA lesion is 20% stenosed.  Dist RCA lesion is 15% stenosed.  Post intervention, there is a 0% residual stenosis.  A stent was successfully placed.  Significant multivessel native CAD with culprit vessel being the LAD in the patient's acute coronary syndrome presentation.  The LAD is a large-caliber  vessel that has 30% proximal stenosis after the first bifurcating diagonal vessel. There is a 99% mid LAD stenosis after prominent septal perforating artery followed by more distal 30 and 20% stenoses. Initial TIMI flow was TIMI I. Left circumflex vessel had 50% mid AV groove stenosis after the takeoff of the marginal vessel followed by 20% stenosis; the RCA was a dominant vessel that had mild irregularity with 20% mid narrowing and a 90-95% bifurcation stenosis involving the ostium of the PDA and the continuation branch.  LVEDP 14 mmHg.  Successful percutaneous coronary prevention to the subtotal 99% LAD stenosis treated with PTCA/DES stenting with a 3.0 x 15 mm Resolute Onyx DES stent postdilated to 3.25 mm with a 99% stenosis being reduced to 0% and TIMI I flow being improved to TIMI-3 flow.  RECOMMENDATION: DAPT therapy for minimum of 1 year. Antianginal medication for concomitant CAD. Hydrate aggressively postprocedure. Plan for staged complex PCI to the RCA bifurcation stenosis involving the PDA and continuation branch and several days depending upon renal function. Smoking cessation is imperative. High potency statin therapy and LDL less than 70 and preferably in the 50s or below. Intervention      Cardiac cath 10/25/19  RPDA lesion is 95% stenosed.  RPAV lesion is 90% stenosed.  Mid RCA lesion is 20% stenosed.  Post intervention, there is a 0% residual stenosis.  Dist RCA-2 lesion is 50% stenosed.  A stent was successfully placed.  Dist RCA-1 lesion is 15% stenosed.  Post intervention, there is a 0% residual stenosis.  Post intervention, there is a 30% residual stenosis.  A stent was successfully placed.  A stent was successfully placed.   Difficult but successful complex PCI involving the distal RCA and bifurcation of the ostial PDA and ostial PLA territory continuation branch utilizing [place] Cutting Balloon with ultimate DES stenting of the distal RCA  into the PDA vessel with a 2.5T  millimeter Resolute Onyx DES stent postdilated from 2.8 mm in the distal RCA to 2.1 mm at the ostium of the PDA with the stenoses being reduced to 0%; and Cutting Balloon and PTCA of the ostium of the PLA continuation branch with a 90% stenosis being reduced to approximately 30%.  RECOMMENDATION: DAPT therapy for minimum of a year.  Smoking cessation is essential.  Aggressive lipid-lowering therapy with target LDL less than 70.  Optimal blood pressure control and GDMT for ischemic cardiomyopathy with plans for subsequent echo Doppler study assess for recovery of LV function following the patient's interventional procedures.  ECHO:10/22/2019 1. Left ventricular ejection fraction, by visual estimation, is 30 to 35%. The left ventricle has moderately decreased function. There is mildly increased left ventricular hypertrophy. 2. The mid anterior wall into the apex is severely hypokinetic/akinetic consistent with LAD infarction. 3. Mid and apical anterior septum, apical anterior segment, apical inferior segment, and apex are abnormal. 4. Left ventricular diastolic parameters are consistent with Grade I diastolic dysfunction (impaired relaxation). 5. The left ventricle demonstrates regional wall motion abnormalities. 6. Global right ventricle has normal systolic function.The right ventricular size is normal. No increase in right ventricular wall thickness. 7. Left atrial size was normal. 8. Right atrial size was normal. 9. Presence of pericardial fat pad. 10. The pericardial effusion is circumferential. 11. Trivial pericardial effusion is present. 12. The mitral valve is grossly normal. Trace mitral valve regurgitation. No evidence of mitral stenosis. 13. The tricuspid valve is grossly normal. Tricuspid valve regurgitation is mild. 14. The aortic valve is tricuspid. Aortic valve regurgitation is not visualized. No evidence of aortic valve sclerosis or  stenosis. 15. The pulmonic valve was grossly normal. Pulmonic valve regurgitation is not visualized. 16. Normal pulmonary artery systolic pressure. 17. The inferior vena cava is normal in size with greater than 50% respiratory variability, suggesting right atrial pressure of 3 mmHg.  Patient Profile     63 y.o. female with hypertension, hyperlipidemia, tobacco abuse, and GERD admitted with NSTEMI. She was found to have severe disease in the LAD and distal RCA with moderate LCX disease. She underwent PCI of the LAD and had staged RCA PCI   Assessment & Plan    NSTEMI Patient underwent LAD PCI 11/27 and staged PCI to RCA 10/25/19 - No further CP - continue ASA, Brilinta, carvedilol, Atorvastatin, and Imdur. Will add Losartan - creatinine and Hgb stable - cath site, right groin, is clean and dry without signs of hematoma or bruit - patient working with cardiac rehab. Ambulated without symptoms yesterday - possible discharge today  Acute systolic and diastolic HF - Before PCI LVED 30-35% with severe hypokinesis/akinesis of the anterior myocardium  - Post-PCI Limited echo showed EF 45%, overall improved echo. No plans to discharge with life vest.  - continue BB. Will add Losartan 25 mg daily with plan to change to Entresto  HTN - amlodipine stopped 2/2 to new low EF - continue carvedilol 12.5 mg BID - will add losartan 25 mg daily - BP stable  HLD - LDL 142, goal <70 - Continue atorvastatin - will need follow up labs  AKI - resolved - creatinine stable  Tobacco abuse - cessation recommended  For questions or updates, please contact [facility] Please consult [email or web address] for contact info under        Signed, Cadence Ninfa Meeker, PA-C  10/26/2019, 7:14 AM    ---------------------------------------------------------------------------------------------   History and all data above reviewed.  Patient examined.  I agree with  the findings as above.  Jade [name]  is feeling well, no concerns.   Constitutional: No acute distress Eyes: pupils equally round and reactive to light, sclera non-icteric, normal conjunctiva and lids ENMT: normal dentition, moist mucous membranes Cardiovascular: regular rhythm, normal rate, no murmurs. S1 and S2 normal. Radial pulses normal bilaterally. No jugular venous distention.  Respiratory: clear to auscultation bilaterally GI : normal bowel sounds, soft and nontender. No distention.   MSK: extremities warm, well perfused. No edema.  NEURO: grossly nonfocal exam, moves all extremities. PSYCH: alert and oriented x 3, normal mood and affect.   All available labs, radiology testing, previous records reviewed. Agree with documented assessment and plan of my colleague as stated above with the following additions or changes:  Principal Problem:   NSTEMI (non-ST elevated myocardial infarction) ([place]) Active Problems:   Tobacco abuse   Hypertension   Borderline diabetes   ACS (acute coronary syndrome) ([facility])   Hyperlipidemia LDL goal <70   CAD in native artery s/p DES LAD, DES RCA 10/26/19   Ischemic cardiomyopathy   Weakness    Plan: we will continue ASA, ticagrelor, carvedilol, atorvastatin, and Imdur. Will add Losartan, consider transition to [facility].   Length of Stay:  LOS: 4 days   Elouise Munroe, MD [facility]

## 2019-10-26 NOTE — Care Management Important Message (Signed)
Important Message  Patient Details  Name: Jade [name] MRN: [id number] Date of Birth: 22-May-1956   Medicare Important Message Given:  Yes     [name] 10/26/2019, 10:06 AM

## 2019-10-28 NOTE — Telephone Encounter (Signed)
Patient contacted regarding discharge from [facility] on 10/26/19.  Patient understands to follow up with provider [place] on 11/03/19 at 1040  at [facility]. Patient understands discharge instructions? Yes Patient undr no}erstands medications and regiment? Yes Patient understands to bring all medications to this visit? Yes

## 2019-11-03 ENCOUNTER — Encounter: Payer: Self-pay | Admitting: *Deleted

## 2019-11-03 ENCOUNTER — Encounter: Payer: Self-pay | Admitting: Cardiology

## 2019-11-03 ENCOUNTER — Ambulatory Visit (INDEPENDENT_AMBULATORY_CARE_PROVIDER_SITE_OTHER): Payer: Medicare Other | Admitting: Cardiology

## 2019-11-03 ENCOUNTER — Other Ambulatory Visit: Payer: Self-pay

## 2019-11-03 VITALS — BP 100/70 | HR 60 | Ht 66.0 in | Wt 183.0 lb

## 2019-11-03 DIAGNOSIS — I2510 Atherosclerotic heart disease of native coronary artery without angina pectoris: Secondary | ICD-10-CM | POA: Diagnosis not present

## 2019-11-03 DIAGNOSIS — Z720 Tobacco use: Secondary | ICD-10-CM

## 2019-11-03 DIAGNOSIS — I10 Essential (primary) hypertension: Secondary | ICD-10-CM

## 2019-11-03 DIAGNOSIS — E785 Hyperlipidemia, unspecified: Secondary | ICD-10-CM

## 2019-11-03 DIAGNOSIS — I519 Heart disease, unspecified: Secondary | ICD-10-CM | POA: Diagnosis not present

## 2019-11-03 NOTE — Patient Instructions (Signed)
Medication Instructions:  Your physician recommends that you continue on your current medications as directed. Please refer to the Current Medication list given to you today.  *If you need a refill on your cardiac medications before your next appointment, please call your pharmacy*  Lab Work: Your physician recommends that you return for lab work in: TODAY BMP,Magnesium  If you have labs (blood work) drawn today and your tests are completely normal, you will receive your results only by: Marland Kitchen MyChart Message (if you have MyChart) OR . A paper copy in the mail If you have any lab test that is abnormal or we need to change your treatment, we will call you to review the results.  Testing/Procedures: None  Follow-Up: At [facility], you and your health needs are our priority.  As part of our continuing mission to provide you with exceptional heart care, we have created designated Provider Care Teams.  These Care Teams include your primary Cardiologist (physician) and Advanced Practice Providers (APPs -  Physician Assistants and Nurse Practitioners) who all work together to provide you with the care you need, when you need it.  Your next appointment:   3 month(s)  The format for your next appointment:   In Person  Provider:   Berniece Salines, DO  Other Instructions

## 2019-11-03 NOTE — Progress Notes (Signed)
Cardiology Office Note:    Date:  11/03/2019   ID:  Jade [name], DOB [birth year], MRN [id number]  PCP:  Debbrah Alar, NP  Cardiologist:  Kirk Ruths, MD  Electrophysiologist:  None   Referring MD: Debbrah Alar, NP   Chief Complaint  Patient presents with  . Hospitalization Follow-up    History of Present Illness:    Jade [name] is a 63 y.o. female with a hx of coronary artery disease with recent PCI to the LAD and RCA with drug-eluting stents and has been placed on aspirin and Brilinta, hyperlipidemia, former smoker quit 2 weeks ago, hypertension, hyperlipidemia.  The patient presents for follow-up visit after a recent hospitalization for NSTEMI.  The patient tells me that a few days prior to tennis given events leading to her hospitalization was increasing midsternal chest burning sensation which was intermittent.  She notes that the persistence of the pain and her family urging her to go to the ED to be evaluated brought her to the hospital.  She tells me that she was first valuated at the [place] and her initial troponin was noted to be 13,910 therefore she was immediately transferred to [facility] to undergo cardiac catheterization.  As stated above she did receive 2 stents in her LAD on October 22, 2019 and then a staged RCA PCI on October 25, 2019 with DES x2 to her RCA.   Today she is here for a hospital follow-up and she does not experience any chest pain.  With tells me she has been short of breath.  The shortness of breath she knows is not different for her baseline as prior to hospitalization she has had shortness of breath at baseline.  He offers no other complaints at this time.  Past Medical History:  Diagnosis Date  . Benign microscopic hematuria    neg cystoscopy 11/12- dr. Janice Norrie  . DJD (degenerative joint disease)    L KNEE  . Family history of anesthesia complication    multiple family members with history of this  .  GERD (gastroesophageal reflux disease)   . Hurthle cell neoplasm of thyroid   . Hyperlipidemia LDL goal <70 10/23/2019  . Hypertension   . Malignant hyperthermia   . NSTEMI (non-ST elevated myocardial infarction) ([place]) 10/22/2019  . Numbness and tingling in left arm     Past Surgical History:  Procedure Laterality Date  . ABDOMINAL HYSTERECTOMY  1996  . BREAST EXCISIONAL BIOPSY Left 01/2016  . BREAST EXCISIONAL BIOPSY Left 03/2016  . CERVICAL DISC SURGERY  2013   Dr. Arnoldo Morale  . CORONARY BALLOON ANGIOPLASTY N/A 10/25/2019   Procedure: CORONARY BALLOON ANGIOPLASTY;  Surgeon: Troy Sine, MD;  Location: [place] [facility];  Service: Cardiovascular;  Laterality: N/A;  . CORONARY STENT INTERVENTION N/A 10/22/2019   Procedure: CORONARY STENT INTERVENTION;  Surgeon: Troy Sine, MD;  Location: [place] [facility];  Service: Cardiovascular;  Laterality: N/A;  mid lad   . CORONARY STENT INTERVENTION N/A 10/25/2019   Procedure: CORONARY STENT INTERVENTION;  Surgeon: Troy Sine, MD;  Location: [place] [facility];  Service: Cardiovascular;  Laterality: N/A;  . DILATION AND CURETTAGE OF UTERUS    . KNEE CARTILAGE SURGERY  1999   right knee  . LEFT HEART CATH AND CORONARY ANGIOGRAPHY N/A 10/22/2019   Procedure: LEFT HEART CATH AND CORONARY ANGIOGRAPHY;  Surgeon: Troy Sine, MD;  Location: [facility];  Service: Cardiovascular;  Laterality: N/A;  .  ROTATOR CUFF REPAIR Right 05/25/13  . 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 TOTAL KNEE ARTHROPLASTY Right 01/19/2015   Procedure: RIGHT TOTAL KNEE ARTHROPLASTY;  Surgeon: Johnn Hai, MD;  Location: [facility];  Service: Orthopedics;  Laterality: Right;  . TUBAL LIGATION      Current Medications: Current Meds  Medication Sig  . albuterol (PROVENTIL HFA;VENTOLIN HFA) 108 (90 Base) MCG/ACT  inhaler Inhale 2 puffs into the lungs every 6 (six) hours as needed for wheezing or shortness of breath.  Marland Kitchen aspirin 81 MG chewable tablet Chew 1 tablet (81 mg total) by mouth daily.  Marland Kitchen atorvastatin (LIPITOR) 80 MG tablet Take 1 tablet (80 mg total) by mouth daily.  . carvedilol (COREG) 12.5 MG tablet Take 1 tablet (12.5 mg total) by mouth 2 (two) times daily with a meal.  . fluticasone (FLONASE) 50 MCG/ACT nasal spray USE 2 SPRAYS IN EACH NOSTRIL EVERY DAY  . gabapentin (NEURONTIN) 300 MG capsule Take 1 capsule by mouth daily in the morning, 1 at noon and 2 at bedtime. (Patient taking differently: Take 300 mg by mouth 4 (four) times daily. Take one capsule by mouth daily in the morning, take one capsule by mouth at noon, then take 2 capsules by mouth at bedtime per patient.)  . isosorbide mononitrate (IMDUR) 30 MG 24 hr tablet Take 1 tablet (30 mg total) by mouth daily.  Marland Kitchen linaclotide (LINZESS) 145 MCG CAPS capsule Take 145 mcg by mouth daily before breakfast.  . losartan (COZAAR) 25 MG tablet Take 1 tablet (25 mg total) by mouth daily.  . methocarbamol (ROBAXIN) 500 MG tablet Take 1 tablet (500 mg total) by mouth 2 (two) times daily as needed. (Patient taking differently: Take 500 mg by mouth 2 (two) times daily. )  . nitroGLYCERIN (NITROSTAT) 0.4 MG SL tablet Place 1 tablet (0.4 mg total) under the tongue every 5 (five) minutes as needed for chest pain (CP or SOB).  Marland Kitchen oxyCODONE-acetaminophen (PERCOCET) 7.5-325 MG tablet Take 1 tablet by mouth at bedtime as needed. (Patient taking differently: Take 1 tablet by mouth at bedtime. )  . pantoprazole (PROTONIX) 40 MG tablet Take 1 tablet (40 mg total) by mouth 2 (two) times daily. TAKE 1 TABLET BY MOUTH ONCE DAILY (Patient taking differently: Take 40 mg by mouth 2 (two) times daily. )  . potassium chloride SA (K-DUR,KLOR-CON) 20 MEQ tablet TAKE 1 TABLET (20 MEQ TOTAL) 2 (TWO) TIMES DAILY  (Patient taking differently: Take 20 mEq by mouth 2 (two) times  daily. )  . ticagrelor (BRILINTA) 90 MG TABS tablet Take 1 tablet (90 mg total) by mouth 2 (two) times daily.     Allergies:   Shrimp [shellfish allergy], Ace inhibitors, Aspirin, and Azithromycin   Social History   Socioeconomic History  . Marital status: Married    Spouse name: Not on file  . Number of children: 3  . Years of education: Not on file  . Highest education level: Not on file  Occupational History  . Not on file  Social Needs  . Financial resource strain: Not on file  . Food insecurity    Worry: Not on file    Inability: Not on file  . Transportation needs    Medical: Not on file    Non-medical: Not on file  Tobacco Use  . Smoking status: Current Every Day Smoker  Packs/day: 1.00    Years: 39.00    Pack years: 39.00    Types: Cigarettes  . Smokeless tobacco: Never Used  Substance and Sexual Activity  . Alcohol use: No    Alcohol/week: 0.0 standard drinks  . Drug use: No  . Sexual activity: Yes    Partners: Male  Lifestyle  . Physical activity    Days per week: Not on file    Minutes per session: Not on file  . Stress: Not on file  Relationships  . Social Herbalist on phone: Not on file    Gets together: Not on file    Attends religious service: Not on file    Active member of club or organization: Not on file    Attends meetings of clubs or organizations: Not on file    Relationship status: Not on file  Other Topics Concern  . Not on file  Social History Narrative   Regular exercise:  No   Caffeine Use:  2 cups coffee and 3-4 glasses of soda daily.   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     Family History: The patient's family history includes Breast cancer in her paternal aunt; Cancer in her father and paternal aunt; Dementia in her father; Diabetes in her father; Heart disease in her father; Hyperlipidemia in her father and mother; Hypertension in her father and  mother.  ROS:   Review of Systems  Constitution: Negative for decreased appetite, fever and weight gain.  HENT: Negative for congestion, ear discharge, hoarse voice and sore throat.   Eyes: Negative for discharge, redness, vision loss in right eye and visual halos.  Cardiovascular: Negative for chest pain, dyspnea on exertion, leg swelling, orthopnea and palpitations.  Respiratory: Negative for cough, hemoptysis, shortness of breath and snoring.   Endocrine: Negative for heat intolerance and polyphagia.  Hematologic/Lymphatic: Negative for bleeding problem. Does not bruise/bleed easily.  Skin: Negative for flushing, nail changes, rash and suspicious lesions.  Musculoskeletal: Negative for arthritis, joint pain, muscle cramps, myalgias, neck pain and stiffness.  Gastrointestinal: Negative for abdominal pain, bowel incontinence, diarrhea and excessive appetite.  Genitourinary: Negative for decreased libido, genital sores and incomplete emptying.  Neurological: Negative for brief paralysis, focal weakness, headaches and loss of balance.  Psychiatric/Behavioral: Negative for altered mental status, depression and suicidal ideas.  Allergic/Immunologic: Negative for HIV exposure and persistent infections.    EKGs/Labs/Other Studies Reviewed:    The following studies were reviewed today:   EKG:  The ekg ordered today demonstrates sinus rhythm, heart rate 60 bpm, left anterior fascicular block with evidence of old anterior septal infarction.  TTE IMPRESSIONS  1. Left ventricular ejection fraction, by visual estimation, is 45%. The left ventricle has mild to moderately decreased function. There is no left ventricular hypertrophy.  2. LVEF is approximately 45% with akinesis of the apex and distal inferior wall. Overall improved from echo on 10/22/19.   3. Global right ventricle has normal systolic function.The right ventricular size is normal. No increase in right ventricular wall thickness.  4.  Left atrial size was normal.  5. Right atrial size was normal.  6. The mitral valve is normal in structure. Mild mitral valve regurgitation.  7. The tricuspid valve is normal in structure. Tricuspid valve regurgitation is mild.  8. The aortic valve is tricuspid. Aortic valve regurgitation is not visualized.  9. The pulmonic valve was normal in  structure. Pulmonic valve regurgitation is not visualized. 10. Normal pulmonary artery systolic pressure.  PCI 10/25/2019  RPDA lesion is 95% stenosed.  RPAV lesion is 90% stenosed.  Mid RCA lesion is 20% stenosed.  Post intervention, there is a 0% residual stenosis.  Dist RCA-2 lesion is 50% stenosed.  A stent was successfully placed.  Dist RCA-1 lesion is 15% stenosed.  Post intervention, there is a 0% residual stenosis.  Post intervention, there is a 30% residual stenosis.  A stent was successfully placed.  A stent was successfully placed.   Difficult but successful complex PCI involving the distal RCA and bifurcation of the ostial PDA and ostial PLA territory continuation branch utilizing [place] Cutting Balloon with ultimate DES stenting of the distal RCA into the PDA vessel with a 2.5T millimeter Resolute Onyx DES stent postdilated from 2.8 mm in the distal RCA to 2.1 mm at the ostium of the PDA with the stenoses being reduced to 0%; and Cutting Balloon and PTCA of the ostium of the PLA continuation branch with a 90% stenosis being reduced to approximately 30%.  RECOMMENDATION: DAPT therapy for minimum of a year.  Smoking cessation is essential.  Aggressive lipid-lowering therapy with target LDL less than 70.  Optimal blood pressure control and GDMT for ischemic cardiomyopathy with plans for subsequent echo Doppler study assess for recovery of LV function following the patient's interventional procedures.  Left heart catheterization  RPDA lesion is 95% stenosed.  RPAV lesion is 90% stenosed.  Mid Cx lesion is 50% stenosed.   Dist Cx lesion is 20% stenosed.  Prox LAD to Mid LAD lesion is 30% stenosed.  Mid LAD lesion is 99% stenosed.  Mid LAD to Dist LAD lesion is 30% stenosed.  Dist LAD lesion is 20% stenosed.  Mid RCA lesion is 20% stenosed.  Dist RCA lesion is 15% stenosed.  Post intervention, there is a 0% residual stenosis.  A stent was successfully placed.   Significant multivessel native CAD with culprit vessel being the LAD in the patient's acute coronary syndrome presentation.  The LAD is a large-caliber vessel that has 30% proximal stenosis after the first bifurcating diagonal vessel.  There is a 99% mid LAD stenosis after prominent septal perforating artery followed by more distal 30 and 20% stenoses.  Initial TIMI flow was TIMI I.  Left circumflex vessel had 50% mid AV groove stenosis after the takeoff of the marginal vessel followed by 20% stenosis; the RCA was a dominant vessel that had mild irregularity with 20% mid narrowing and a 90-95% bifurcation stenosis involving the ostium of the PDA and the continuation branch.  LVEDP 14 mmHg.  Successful percutaneous coronary prevention to the subtotal 99% LAD stenosis treated with PTCA/DES stenting with a 3.0 x 15 mm Resolute Onyx DES stent postdilated to 3.25 mm with a 99% stenosis being reduced to 0% and TIMI I flow being improved to TIMI-3 flow.  RECOMMENDATION: DAPT therapy for minimum of 1 year.  Antianginal medication for concomitant CAD.  Hydrate aggressively postprocedure.  Plan for staged complex PCI to the RCA bifurcation stenosis involving the PDA and continuation branch and several days depending upon renal function.  Smoking cessation is imperative.  High potency statin therapy and LDL less than 70 and preferably in the 50s or below.   Recent Labs: 01/26/2019: TSH 1.97 10/21/2019: ALT 20; Magnesium 2.6 10/26/2019: BUN 10; Creatinine, Ser 0.92; Hemoglobin 11.6; Platelets 343; Potassium 4.4; Sodium 138  Recent Lipid Panel     Component Value Date/Time  CHOL 204 (H) 10/23/2019 0341   TRIG 177 (H) 10/23/2019 0341   HDL 27 (L) 10/23/2019 0341   CHOLHDL 7.6 10/23/2019 0341   VLDL 35 10/23/2019 0341   LDLCALC 142 (H) 10/23/2019 0341   LDLDIRECT 175.0 08/10/2018 1209    Physical Exam:    VS:  BP 100/70 (BP Location: Left Arm, Patient Position: Sitting, Cuff Size: Normal)   Pulse 60   Ht 5\' 6"  (1.676 m)   Wt 183 lb (83 kg)   LMP 11/25/1994   BMI 29.54 kg/m     Wt Readings from Last 3 Encounters:  11/03/19 183 lb (83 kg)  10/26/19 182 lb (82.6 kg)  10/13/19 190 lb (86.2 kg)     GEN: Well nourished, well developed in no acute distress HEENT: Normal NECK: No JVD; No carotid bruits LYMPHATICS: No lymphadenopathy CARDIAC: S1S2 noted,RRR, no murmurs, rubs, gallops RESPIRATORY:  Clear to auscultation without rales, wheezing or rhonchi  ABDOMEN: Soft, non-tender, non-distended, +bowel sounds, no guarding. EXTREMITIES: No edema, No cyanosis, no clubbing MUSCULOSKELETAL:  No edema; No deformity  SKIN: Warm and dry NEUROLOGIC:  Alert and oriented x 3, non-focal PSYCHIATRIC:  Normal affect, good insight  ASSESSMENT:    1. Coronary artery disease involving native coronary artery of native heart without angina pectoris   2. Essential hypertension   3. Depressed left ventricular systolic function   4. Hyperlipidemia LDL goal <70   5. Tobacco abuse    PLAN:    She is now back to her baseline with shortness of breath. She denies worsening. I will continue to monitor the patient as she is on brillinta which could cause shortness of breath.   She will continue on her DAPT for minimum 12 months. I have educated the patient that she can not stop her DAPT for any reasons with in the next 12 months.   She will continue on lipitor 80 mg for now- I will repeat her LDL in 3 months and if her LDL is not less than 70 I will add Zetia 10 mg at that time.   With her mildly depressed LV systolic function EF [id number] -  continue coreg 12.5mg  BID, Losartan 25 mg daily.   Continue tobacco cessation was advised.  During her visit she inquire about going to work - and based on my assessment the patient should be able to work with her already previously noted restriction per her pcp.  The patient is in agreement with the above plan. The patient left the office in stable condition.  The patient will follow up in 3 months.   Medication Adjustments/Labs and Tests Ordered: Current medicines are reviewed at length with the patient today.  Concerns regarding medicines are outlined above.  Orders Placed This Encounter  Procedures  . Basic Metabolic Panel (BMET)  . Magnesium   No orders of the defined types were placed in this encounter.   Patient Instructions  Medication Instructions:  Your physician recommends that you continue on your current medications as directed. Please refer to the Current Medication list given to you today.  *If you need a refill on your cardiac medications before your next appointment, please call your pharmacy*  Lab Work: Your physician recommends that you return for lab work in: TODAY BMP,Magnesium  If you have labs (blood work) drawn today and your tests are completely normal, you will receive your results only by: Marland Kitchen MyChart Message (if you have MyChart) OR . A paper copy in the mail If you have  any lab test that is abnormal or we need to change your treatment, we will call you to review the results.  Testing/Procedures: None  Follow-Up: At [facility], you and your health needs are our priority.  As part of our continuing mission to provide you with exceptional heart care, we have created designated Provider Care Teams.  These Care Teams include your primary Cardiologist (physician) and Advanced Practice Providers (APPs -  Physician Assistants and Nurse Practitioners) who all work together to provide you with the care you need, when you need it.  Your next appointment:    3 month(s)  The format for your next appointment:   In Person  Provider:   Berniece Salines, DO  Other Instructions       Adopting a Healthy Lifestyle.  Know what a healthy weight is for you (roughly BMI <25) and aim to maintain this   Aim for 7+ servings of fruits and vegetables daily   65-80+ fluid ounces of water or unsweet tea for healthy kidneys   Limit to max 1 drink of alcohol per day; avoid smoking/tobacco   Limit animal fats in diet for cholesterol and heart health - choose grass fed whenever available   Avoid highly processed foods, and foods high in saturated/trans fats   Aim for low stress - take time to unwind and care for your mental health   Aim for 150 min of moderate intensity exercise weekly for heart health, and weights twice weekly for bone health   Aim for 7-9 hours of sleep daily   When it comes to diets, agreement about the perfect plan isnt easy to find, even among the experts. Experts at the [employer] developed an idea known as the Healthy Eating Plate. Just imagine a plate divided into logical, healthy portions.   The emphasis is on diet quality:   Load up on vegetables and fruits - one-half of your plate: Aim for color and variety, and remember that potatoes dont count.   Go for whole grains - one-quarter of your plate: Whole wheat, barley, wheat berries, quinoa, oats, brown rice, and foods made with them. If you want pasta, go with whole wheat pasta.   Protein power - one-quarter of your plate: Fish, chicken, beans, and nuts are all healthy, versatile protein sources. Limit red meat.   The diet, however, does go beyond the plate, offering a few other suggestions.   Use healthy plant oils, such as olive, canola, soy, corn, sunflower and peanut. Check the labels, and avoid partially hydrogenated oil, which have unhealthy trans fats.   If youre thirsty, drink water. Coffee and tea are good in moderation, but skip sugary  drinks and limit milk and dairy products to one or two daily servings.   The type of carbohydrate in the diet is more important than the amount. Some sources of carbohydrates, such as vegetables, fruits, whole grains, and beans-are healthier than others.   Finally, stay active  Signed, Berniece Salines, DO  11/03/2019 7:24 PM    [place]

## 2019-11-04 ENCOUNTER — Encounter: Payer: Self-pay | Admitting: *Deleted

## 2019-11-04 LAB — BASIC METABOLIC PANEL
BUN/Creatinine Ratio: 14 (ref 12–28)
BUN: 10 mg/dL (ref 8–27)
CO2: 18 mmol/L — ABNORMAL LOW (ref 20–29)
Calcium: 9.4 mg/dL (ref 8.7–10.3)
Chloride: 104 mmol/L (ref 96–106)
Creatinine, Ser: 0.71 mg/dL (ref 0.57–1.00)
GFR calc Af Amer: 103 mL/min/{1.73_m2} (ref >59)
GFR calc non Af Amer: 90 mL/min/{1.73_m2} (ref >59)
Glucose: 88 mg/dL (ref 65–99)
Potassium: 4.7 mmol/L (ref 3.5–5.2)
Sodium: 140 mmol/L (ref 134–144)

## 2019-11-04 LAB — MAGNESIUM: Magnesium: 1.8 mg/dL (ref 1.6–2.3)

## 2019-11-04 NOTE — Addendum Note (Signed)
Addended by: Jerl Santos R on: 11/04/2019 02:47 PM   Modules accepted: Orders

## 2019-11-05 ENCOUNTER — Ambulatory Visit: Payer: Medicare Other | Admitting: Gastroenterology

## 2019-11-09 ENCOUNTER — Other Ambulatory Visit: Payer: Self-pay

## 2019-11-09 ENCOUNTER — Telehealth: Payer: Self-pay | Admitting: Family

## 2019-11-09 ENCOUNTER — Ambulatory Visit: Payer: Medicare Other | Admitting: Family

## 2019-11-09 ENCOUNTER — Ambulatory Visit (INDEPENDENT_AMBULATORY_CARE_PROVIDER_SITE_OTHER): Payer: Medicare Other | Admitting: Family

## 2019-11-09 ENCOUNTER — Encounter: Payer: Self-pay | Admitting: Family

## 2019-11-09 VITALS — BP 118/71 | HR 76 | Temp 96.9°F | Resp 16 | Ht 66.0 in | Wt 181.0 lb

## 2019-11-09 DIAGNOSIS — I2510 Atherosclerotic heart disease of native coronary artery without angina pectoris: Secondary | ICD-10-CM | POA: Diagnosis not present

## 2019-11-09 DIAGNOSIS — E785 Hyperlipidemia, unspecified: Secondary | ICD-10-CM | POA: Diagnosis not present

## 2019-11-09 DIAGNOSIS — I10 Essential (primary) hypertension: Secondary | ICD-10-CM

## 2019-11-09 DIAGNOSIS — I25118 Atherosclerotic heart disease of native coronary artery with other forms of angina pectoris: Secondary | ICD-10-CM

## 2019-11-09 DIAGNOSIS — Z720 Tobacco use: Secondary | ICD-10-CM

## 2019-11-09 MED ORDER — POTASSIUM CHLORIDE CRYS ER 20 MEQ PO TBCR: EXTENDED_RELEASE_TABLET | ORAL | 1 refills | Status: DC

## 2019-11-09 MED ORDER — PANTOPRAZOLE SODIUM 40 MG PO TBEC: 40.0000 mg | DELAYED_RELEASE_TABLET | Freq: Every day | ORAL | 2 refills | Status: DC

## 2019-11-09 MED FILL — POTASSIUM CHLORIDE CRYS ER: 20 | 90 days supply | Qty: 180 | Fill #0

## 2019-11-09 NOTE — Patient Instructions (Addendum)
Please call Cardiac Rehab to schedule an appointment to begin rehab.   [phone]

## 2019-11-09 NOTE — Progress Notes (Signed)
Subjective:    Patient ID: Jade [name], female    DOB: 02-09-1956, 63 y.o.   MRN: [id number]  HPI  Patient is a 63 yr old female who presents today for hospital follow up. Pt presented to the ED on 10/21/19 with chest pain.  Pt noted to have NSTEMI and was admitted. She underwent a cardiac cath on 10/22/19.  Cath results as follows:  CARDIAC CATH: 10/22/2019  RPDA lesion is 95% stenosed.  RPAV lesion is 90% stenosed.  Mid Cx lesion is 50% stenosed.  Dist Cx lesion is 20% stenosed.  Prox LAD to Mid LAD lesion is 30% stenosed.  Mid LAD lesion is 99% stenosed.  Mid LAD to Dist LAD lesion is 30% stenosed.  Dist LAD lesion is 20% stenosed.  Mid RCA lesion is 20% stenosed.  Dist RCA lesion is 15% stenosed.  Post intervention, there is a 0% residual stenosis.  A stent was successfully placed.   A second cath was performed on 10/25/19 with the following results:   Cardiac cath 10/25/19  RPDA lesion is 95% stenosed.  RPAV lesion is 90% stenosed.  Mid RCA lesion is 20% stenosed.  Post intervention, there is a 0% residual stenosis.  Dist RCA-2 lesion is 50% stenosed.  A stent was successfully placed.  Dist RCA-1 lesion is 15% stenosed.  Post intervention, there is a 0% residual stenosis.  Post intervention, there is a 30% residual stenosis.  A stent was successfully placed.  A stent was successfully placed.  Echo performed on 10/22/19 noting LVEF 30-35%. She reports that since she has been home she has had several instances of chest discomfort which have resolved with use of nitroglycerine.  She states that she fatigues easily since her hospitalization and notes that she becomes easily winded. She does not feel that she is ready to return to work. 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 She was referred to cardiac rehab but has not een able to sign up yet.  Review of Systems See HPI  Past Medical History:  Diagnosis Date  . Benign microscopic  hematuria    neg cystoscopy 11/12- dr. Janice Norrie  . DJD (degenerative joint disease)    L KNEE  . Family history of anesthesia complication    multiple family members with history of this  . GERD (gastroesophageal reflux disease)   . Hurthle cell neoplasm of thyroid   . Hyperlipidemia LDL goal <70 10/23/2019  . Hypertension   . Malignant hyperthermia   . NSTEMI (non-ST elevated myocardial infarction) ([place]) 10/22/2019  . Numbness and tingling in left arm      Social History   Socioeconomic History  . Marital status: Married    Spouse name: Not on file  . Number of children: 3  . Years of education: Not on file  . Highest education level: Not on file  Occupational History  . Not on file  Tobacco Use  . Smoking status: Current Every Day Smoker    Packs/day: 1.00    Years: 39.00    Pack years: 39.00    Types: Cigarettes  . Smokeless tobacco: Never Used  Substance and Sexual Activity  . Alcohol use: No    Alcohol/week: 0.0 standard drinks  . Drug use: No  . Sexual activity: Yes    Partners: Male  Other Topics Concern  . Not on file  Social History Narrative   Regular exercise:  No   Caffeine Use:  2 cups coffee and 3-4 glasses of soda  daily.   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   Social Determinants of Health   Financial Resource Strain:   . Difficulty of Paying Living Expenses: Not on file  Food Insecurity:   . Worried About Charity fundraiser in the Last Year: Not on file  . Ran Out of Food in the Last Year: Not on file  Transportation Needs:   . Lack of Transportation (Medical): Not on file  . Lack of Transportation (Non-Medical): Not on file  Physical Activity:   . Days of Exercise per Week: Not on file  . Minutes of Exercise per Session: Not on file  Stress:   . Feeling of Stress : Not on file  Social Connections:   . Frequency of Communication with Friends and Family: Not on file  . Frequency  of Social Gatherings with Friends and Family: Not on file  . Attends Religious Services: Not on file  . Active Member of Clubs or Organizations: Not on file  . Attends Archivist Meetings: Not on file  . Marital Status: Not on file  Intimate Partner Violence:   . Fear of Current or Ex-Partner: Not on file  . Emotionally Abused: Not on file  . Physically Abused: Not on file  . Sexually Abused: Not on file    Past Surgical History:  Procedure Laterality Date  . ABDOMINAL HYSTERECTOMY  1996  . BREAST EXCISIONAL BIOPSY Left 01/2016  . BREAST EXCISIONAL BIOPSY Left 03/2016  . CERVICAL DISC SURGERY  2013   Dr. Arnoldo Morale  . CORONARY BALLOON ANGIOPLASTY N/A 10/25/2019   Procedure: CORONARY BALLOON ANGIOPLASTY;  Surgeon: Troy Sine, MD;  Location: [facility];  Service: Cardiovascular;  Laterality: N/A;  . CORONARY STENT INTERVENTION N/A 10/22/2019   Procedure: CORONARY STENT INTERVENTION;  Surgeon: Troy Sine, MD;  Location: [facility];  Service: Cardiovascular;  Laterality: N/A;  mid lad   . CORONARY STENT INTERVENTION N/A 10/25/2019   Procedure: CORONARY STENT INTERVENTION;  Surgeon: Troy Sine, MD;  Location: [facility];  Service: Cardiovascular;  Laterality: N/A;  . DILATION AND CURETTAGE OF UTERUS    . KNEE CARTILAGE SURGERY  1999   right knee  . LEFT HEART CATH AND CORONARY ANGIOGRAPHY N/A 10/22/2019   Procedure: LEFT HEART CATH AND CORONARY ANGIOGRAPHY;  Surgeon: Troy Sine, MD;  Location: [facility];  Service: Cardiovascular;  Laterality: N/A;  . ROTATOR CUFF REPAIR Right 05/25/13  . 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 TOTAL KNEE ARTHROPLASTY Right 01/19/2015   Procedure: RIGHT TOTAL KNEE ARTHROPLASTY;  Surgeon: Johnn Hai, MD;  Location: [facility];  Service: Orthopedics;  Laterality:  Right;  . TUBAL LIGATION      Family History  Problem Relation Age of Onset  . Hypertension Mother   . Hyperlipidemia Mother   . Heart disease Father        CAD; stent at age 72  . Diabetes Father   . Hypertension Father   . Hyperlipidemia Father   . Cancer Father        prostate  . Dementia Father   . Cancer Paternal Aunt        BREAST  . Breast cancer Paternal Aunt     Allergies  Allergen Reactions  . Shrimp [Shellfish Allergy] Itching    Rash and lips itching  . Ace Inhibitors Cough    Cough  . Aspirin Nausea And Vomiting  . Azithromycin Rash    Current Outpatient Medications on File Prior to Visit  Medication Sig Dispense Refill  . albuterol (PROVENTIL HFA;VENTOLIN HFA) 108 (90 Base) MCG/ACT inhaler Inhale 2 puffs into the lungs every 6 (six) hours as needed for wheezing or shortness of breath. 1 Inhaler 0  . aspirin 81 MG chewable tablet Chew 1 tablet (81 mg total) by mouth daily. 90 tablet 3  . atorvastatin (LIPITOR) 80 MG tablet Take 1 tablet (80 mg total) by mouth daily. 30 tablet 6  . carvedilol (COREG) 12.5 MG tablet Take 1 tablet (12.5 mg total) by mouth 2 (two) times daily with a meal. 60 tablet 6  . fluticasone (FLONASE) 50 MCG/ACT nasal spray USE 2 SPRAYS IN EACH NOSTRIL EVERY DAY 48 g 1  . gabapentin (NEURONTIN) 300 MG capsule Take 1 capsule by mouth daily in the morning, 1 at noon and 2 at bedtime. (Patient taking differently: Take 300 mg by mouth 4 (four) times daily. Take one capsule by mouth daily in the morning, take one capsule by mouth at noon, then take 2 capsules by mouth at bedtime per patient.) 360 capsule 1  . isosorbide mononitrate (IMDUR) 30 MG 24 hr tablet Take 1 tablet (30 mg total) by mouth daily. 30 tablet 6  . linaclotide (LINZESS) 145 MCG CAPS capsule Take 145 mcg by mouth daily before breakfast.    . losartan (COZAAR) 25 MG tablet Take 1 tablet (25 mg total) by mouth daily. 30 tablet 6  . methocarbamol (ROBAXIN) 500 MG tablet Take 1 tablet  (500 mg total) by mouth 2 (two) times daily as needed. (Patient taking differently: Take 500 mg by mouth 2 (two) times daily. )  2  . nitroGLYCERIN (NITROSTAT) 0.4 MG SL tablet Place 1 tablet (0.4 mg total) under the tongue every 5 (five) minutes as needed for chest pain (CP or SOB). 25 tablet 1  . oxyCODONE-acetaminophen (PERCOCET) 7.5-325 MG tablet Take 1 tablet by mouth at bedtime as needed. (Patient taking differently: Take 1 tablet by mouth at bedtime. ) 30 tablet 0  . ticagrelor (BRILINTA) 90 MG TABS tablet Take 1 tablet (90 mg total) by mouth 2 (two) times daily. 180 tablet 3   No current facility-administered medications on file prior to visit.    BP 118/71 (BP Location: Right Arm, Patient Position: Sitting, Cuff Size: Small)   Pulse 76   Temp (!) 96.9 F (36.1 C) (Temporal)   Resp 16   Ht 5\' 6"  (1.676 m)   Wt 181 lb (82.1 kg)   LMP 11/25/1994   SpO2 100%   BMI 29.21 kg/m       Objective:   Physical Exam Constitutional:      Appearance: She is well-developed.  Neck:     Thyroid: No thyromegaly.  Cardiovascular:     Rate and Rhythm: Normal rate and regular rhythm.     Heart sounds: Normal heart sounds. No murmur.  Pulmonary:     Effort: Pulmonary effort is normal. No respiratory distress.     Breath sounds: Normal breath sounds. No wheezing.  Musculoskeletal:     Cervical back: Neck supple.  Skin:    General: Skin is warm and dry.  Neurological:     Mental Status: She is alert and oriented to person, place, and time.  Psychiatric:  Behavior: Behavior normal.        Thought Content: Thought content normal.        Judgment: Judgment normal.           Assessment & Plan:  CAD-s/p NSTEMI and multiple stent placement.  I think that she would benefit from cardiac rehab and some additional time together strength back before returning to work. I have encouraged her to contact the cardiac rehab to get started and I have written her out of work for the next 3  weeks. On brilinta x 1 year per cardiology, as well as aspirin 81mg .  She saw cardiology for office follow up.   Tobacco abuse- she states that she has not smoked since her hospitalization. I commended her for this and advised her to remain smoke free.  Hyperlipidemia- goal LDL is <70.  Continue lipitor 80 mg. Plan to repeat lipid panel next visit.  Lab Results  Component Value Date   CHOL 204 (H) 10/23/2019   HDL 27 (L) 10/23/2019   LDLCALC 142 (H) 10/23/2019   LDLDIRECT 175.0 08/10/2018   TRIG 177 (H) 10/23/2019   CHOLHDL 7.6 10/23/2019   HTN- bp stable on current regimen. Continue same.

## 2019-11-09 NOTE — Telephone Encounter (Signed)
Please call pt and give her the following number for the cardiac rehab program at [facility])- [phone].  She should be able to call them and let them know that she is not going back to work yet and would like to start cardiac rehab.  They should have the referral in the system already.

## 2019-11-10 ENCOUNTER — Ambulatory Visit: Payer: Medicare Other | Admitting: Cardiology

## 2019-11-10 NOTE — Telephone Encounter (Signed)
Patient advised to call cardiac rehab at [facility] the phone number was provided.

## 2019-11-11 ENCOUNTER — Telehealth: Payer: Self-pay

## 2019-11-11 NOTE — Telephone Encounter (Signed)
Jade [name], can you please help me with this. Thank you

## 2019-11-11 NOTE — Telephone Encounter (Signed)
Rod Holler- Please let her know that I will see if we can get her in sooner in [place].    Dr. Harriet Masson, would you mind placing referral for cardiac Rehab at [facility] please? 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 Tks.

## 2019-11-11 NOTE — Telephone Encounter (Signed)
Patient advised we are trying to get her in sooner and new referral will be entered.

## 2019-11-11 NOTE — Telephone Encounter (Signed)
Copied from [place] [id number]. Topic: General - Inquiry >> Nov 11, 2019  8:54 AM Mathis Bud wrote: Reason for CRM: Patient called cardiac therapy and patient states office does not have anything until 1/12.  Patient states PCP put her out out work till 1/5.  Patient would like to know if there is a place she can get in sooner.  Call back [phone]

## 2019-11-15 ENCOUNTER — Telehealth: Payer: Self-pay | Admitting: Cardiology

## 2019-11-15 ENCOUNTER — Other Ambulatory Visit: Payer: Self-pay | Admitting: *Deleted

## 2019-11-15 MED ORDER — CARVEDILOL 12.5 MG PO TABS: 12.5000 mg | ORAL_TABLET | Freq: Two times a day (BID) | ORAL | 1 refills | Status: AC

## 2019-11-15 MED ORDER — TICAGRELOR 90 MG PO TABS: 90.0000 mg | ORAL_TABLET | Freq: Two times a day (BID) | ORAL | 1 refills | Status: DC

## 2019-11-15 MED ORDER — LOSARTAN POTASSIUM 25 MG PO TABS: 25.0000 mg | ORAL_TABLET | Freq: Every day | ORAL | 1 refills | Status: DC

## 2019-11-15 MED ORDER — NITROGLYCERIN 0.4 MG SL SUBL: 0.4000 mg | SUBLINGUAL_TABLET | SUBLINGUAL | 3 refills | Status: AC | PRN

## 2019-11-15 MED ORDER — ISOSORBIDE MONONITRATE ER 30 MG PO TB24: 30.0000 mg | ORAL_TABLET | Freq: Every day | ORAL | 1 refills | Status: AC

## 2019-11-15 MED ORDER — ATORVASTATIN CALCIUM 80 MG PO TABS: 80.0000 mg | ORAL_TABLET | Freq: Every day | ORAL | 1 refills | Status: DC

## 2019-11-15 MED FILL — PANTOPRAZOLE SOD DR 40 MG T: 40 | 30 days supply | Qty: 60 | Fill #1

## 2019-11-15 NOTE — Telephone Encounter (Signed)
Left message to return call 

## 2019-11-15 NOTE — Telephone Encounter (Signed)
°*  STAT* If patient is at the pharmacy, call can be transferred to refill team.   1. Which medications need to be refilled? (please list name of each medication and dose if known) Atorvastatin 80mg , Losartan 25mg , Brilinta 90mg , Isosorbide mononitrate 30mg , Carvedilol 12.5mg , and Nitroglycerin 0.4mg   2. Which pharmacy/location (including street and city if local pharmacy) is medication to be sent to?[facility]  3. Do they need a 30 day or 90 day supply? [place]

## 2019-11-15 NOTE — Telephone Encounter (Signed)
Refills sent

## 2019-11-16 NOTE — Addendum Note (Signed)
Addended by: Particia Nearing B on: 11/16/2019 02:06 PM   Modules accepted: Orders

## 2019-11-16 NOTE — Telephone Encounter (Signed)
Telephone call to patient. Cardiac rehab referral placed. Left message with patient that they will be reaching out to her and to call back if they don't call her by next week.

## 2019-11-18 ENCOUNTER — Telehealth: Payer: Self-pay | Admitting: Cardiology

## 2019-11-18 NOTE — Telephone Encounter (Signed)
New Message    Pt is calling and is upset/confused. She says someone from our office called her on Monday or Tuesday and told her she needs to schedule with Cardiac Rehab. She says the hospital told her she would need to set up with Cardiac rehab after being discharged. She says she saw Dr Harriet Masson on Dec 9th after being in the hospital and says Dr Harriet Masson told her she could return back to work and she saw no reason why she couldn't go back to work.  She says her PC did not want her to return to work and was sending a referral to cardiac rehab.  Pt is wondering why Dr Harriet Masson advised her she could return back to work on 12/09 and now 12/20 someone is called to tell her she needed to schedule with the Rehab  She would like to change providers as she feels Dr Harriet Masson is not looking at her records and taking it seriously     Please advise

## 2019-11-18 NOTE — Progress Notes (Signed)
Had to leave a voice mail for the coordinator at [facility] with the patient's information.

## 2019-11-18 NOTE — Telephone Encounter (Signed)
Telephone call to patient. States she can't go go to cardiac rehab because its only in the mornings and she works in the mornings. Also states that her primary care sent in a cardiac referral also.Will let DR . Tobb know of situation

## 2019-11-22 MED FILL — NITROGLYCERIN 0.4 MG TAB SL: 0.4 | 7 days supply | Qty: 25 | Fill #0

## 2019-11-22 MED FILL — LOSARTAN POTASSIUM 25 MG TA: 25 | 90 days supply | Qty: 90 | Fill #0

## 2019-11-22 MED FILL — CARVEDILOL 12.5 MG TABLET: 12.5 | 90 days supply | Qty: 180 | Fill #0

## 2019-11-22 MED FILL — BRILINTA 90 MG TABLET: 90 | 30 days supply | Qty: 60 | Fill #0

## 2019-11-22 MED FILL — ISOSORBIDE MN ER 30 MG TAB: 30 | 90 days supply | Qty: 90 | Fill #0

## 2019-11-22 MED FILL — ATORVASTATIN 80 MG TABLET: 80 | 90 days supply | Qty: 90 | Fill #0

## 2019-11-22 NOTE — Telephone Encounter (Signed)
Rod Holler, Do you mind filling out and asking Dr. Charlett Blake to sign it please? I would like her to get started ASAP. Thanks

## 2019-11-22 NOTE — Telephone Encounter (Signed)
Melissa from [facility] called back today stating the patient does want to come now, but they no longer had referral.  She will be faxing over paperwork for PCP to sign and then will need to be faxed back so the patient can schedule then her Rehab. Gave them both front and back fax numbers

## 2019-11-23 NOTE — Telephone Encounter (Signed)
Melissa wants me to sign this, can you try and find it please

## 2019-11-23 NOTE — Telephone Encounter (Signed)
Rod Holler, I have not located this form, Can you find and give to me  Please advise

## 2019-11-24 ENCOUNTER — Telehealth: Payer: Self-pay | Admitting: *Deleted

## 2019-11-24 NOTE — Telephone Encounter (Signed)
Copied from [id number]. Topic: Appointment Scheduling - Scheduling Inquiry for Clinic >> Nov 24, 2019  8:32 AM Mathis Bud wrote: Reason for CRM: Patient states her virtual visit should be on Jan 4th.  Patient states she is suppose to return to work on Jan 5th.  She would like to confirm this appt. Call back [phone]

## 2019-11-26 ENCOUNTER — Other Ambulatory Visit: Payer: Self-pay | Admitting: Specialist

## 2019-11-26 DIAGNOSIS — M79604 Pain in right leg: Secondary | ICD-10-CM

## 2019-11-26 DIAGNOSIS — M7989 Other specified soft tissue disorders: Secondary | ICD-10-CM

## 2019-11-29 ENCOUNTER — Other Ambulatory Visit: Payer: Self-pay

## 2019-11-30 ENCOUNTER — Ambulatory Visit (INDEPENDENT_AMBULATORY_CARE_PROVIDER_SITE_OTHER): Payer: Medicare Other | Admitting: Family

## 2019-11-30 ENCOUNTER — Encounter: Payer: Self-pay | Admitting: Family

## 2019-11-30 ENCOUNTER — Other Ambulatory Visit: Payer: Self-pay

## 2019-11-30 VITALS — BP 102/60 | HR 68 | Temp 95.7°F | Resp 18 | Ht 66.0 in | Wt 185.8 lb

## 2019-11-30 DIAGNOSIS — I25118 Atherosclerotic heart disease of native coronary artery with other forms of angina pectoris: Secondary | ICD-10-CM | POA: Diagnosis not present

## 2019-11-30 NOTE — Progress Notes (Signed)
Subjective:    Patient ID: Jade [name], female    DOB: 01-01-1956, 64 y.o.   MRN: [id number]  HPI  Patient is a 64 yr old female who presents today for follow up. She suffered NSTEMI back in the end of November and had 3 stents placed.  She has not been back to work since that time.  She reports that her energy is still low since her admission and she continues to have SOB with exertion. She reports that she had one episode of chest pain last week which resolved with SL nitro x 1.  Denies current chest pain.  She will start cardiac rehab in [place] on 12/06/2019  Wt Readings from Last 3 Encounters:  11/30/19 185 lb 12.8 oz (84.3 kg)  11/09/19 181 lb (82.1 kg)  11/03/19 183 lb (83 kg)     Review of Systems    see HPI  Past Medical History:  Diagnosis Date  . Benign microscopic hematuria    neg cystoscopy 11/12- dr. Janice Norrie  . DJD (degenerative joint disease)    L KNEE  . Family history of anesthesia complication    multiple family members with history of this  . GERD (gastroesophageal reflux disease)   . Hurthle cell neoplasm of thyroid   . Hyperlipidemia LDL goal <70 10/23/2019  . Hypertension   . Malignant hyperthermia   . NSTEMI (non-ST elevated myocardial infarction) ([place]) 10/22/2019  . Numbness and tingling in left arm      Social History   Socioeconomic History  . Marital status: Married    Spouse name: Not on file  . Number of children: 3  . Years of education: Not on file  . Highest education level: Not on file  Occupational History  . Not on file  Tobacco Use  . Smoking status: Current Every Day Smoker    Packs/day: 1.00    Years: 39.00    Pack years: 39.00    Types: Cigarettes  . Smokeless tobacco: Never Used  Substance and Sexual Activity  . Alcohol use: No    Alcohol/week: 0.0 standard drinks  . Drug use: No  . Sexual activity: Yes    Partners: Male  Other Topics Concern  . Not on file  Social History Narrative   Regular exercise:  No   Caffeine Use:  2 cups coffee and 3-4 glasses of soda daily.   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   Social Determinants of Health   Financial Resource Strain:   . Difficulty of Paying Living Expenses: Not on file  Food Insecurity:   . Worried About Charity fundraiser in the Last Year: Not on file  . Ran Out of Food in the Last Year: Not on file  Transportation Needs:   . Lack of Transportation (Medical): Not on file  . Lack of Transportation (Non-Medical): Not on file  Physical Activity:   . Days of Exercise per Week: Not on file  . Minutes of Exercise per Session: Not on file  Stress:   . Feeling of Stress : Not on file  Social Connections:   . Frequency of Communication with Friends and Family: Not on file  . Frequency of Social Gatherings with Friends and Family: Not on file  . Attends Religious Services: Not on file  . Active Member of Clubs or Organizations: Not on file  . Attends Archivist Meetings: Not on  file  . Marital Status: Not on file  Intimate Partner Violence:   . Fear of Current or Ex-Partner: Not on file  . Emotionally Abused: Not on file  . Physically Abused: Not on file  . Sexually Abused: Not on file    Past Surgical History:  Procedure Laterality Date  . ABDOMINAL HYSTERECTOMY  1996  . BREAST EXCISIONAL BIOPSY Left 01/2016  . BREAST EXCISIONAL BIOPSY Left 03/2016  . CERVICAL DISC SURGERY  2013   Dr. Arnoldo Morale  . CORONARY BALLOON ANGIOPLASTY N/A 10/25/2019   Procedure: CORONARY BALLOON ANGIOPLASTY;  Surgeon: Troy Sine, MD;  Location: [facility];  Service: Cardiovascular;  Laterality: N/A;  . CORONARY STENT INTERVENTION N/A 10/22/2019   Procedure: CORONARY STENT INTERVENTION;  Surgeon: Troy Sine, MD;  Location: [facility] [facility];  Service: Cardiovascular;  Laterality: N/A;  mid lad   . CORONARY STENT INTERVENTION N/A 10/25/2019   Procedure: CORONARY STENT  INTERVENTION;  Surgeon: Troy Sine, MD;  Location: [facility];  Service: Cardiovascular;  Laterality: N/A;  . DILATION AND CURETTAGE OF UTERUS    . KNEE CARTILAGE SURGERY  1999   right knee  . LEFT HEART CATH AND CORONARY ANGIOGRAPHY N/A 10/22/2019   Procedure: LEFT HEART CATH AND CORONARY ANGIOGRAPHY;  Surgeon: Troy Sine, MD;  Location: [place] [facility];  Service: Cardiovascular;  Laterality: N/A;  . ROTATOR CUFF REPAIR Right 05/25/13  . 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 TOTAL KNEE ARTHROPLASTY Right 01/19/2015   Procedure: RIGHT TOTAL KNEE ARTHROPLASTY;  Surgeon: Johnn Hai, MD;  [facility];  Service: Orthopedics;  Laterality: Right;  . TUBAL LIGATION      Family History  Problem Relation Age of Onset  . Hypertension Mother   . Hyperlipidemia Mother   . Heart disease Father        CAD; stent at age 69  . Diabetes Father   . Hypertension Father   . Hyperlipidemia Father   . Cancer Father        prostate  . Dementia Father   . Cancer Paternal Aunt        BREAST  . Breast cancer Paternal Aunt     Allergies  Allergen Reactions  . Shrimp [Shellfish Allergy] Itching    Rash and lips itching  . Ace Inhibitors Cough    Cough  . Aspirin Nausea And Vomiting  . Azithromycin Rash    Current Outpatient Medications on File Prior to Visit  Medication Sig Dispense Refill  . albuterol (PROVENTIL HFA;VENTOLIN HFA) 108 (90 Base) MCG/ACT inhaler Inhale 2 puffs into the lungs every 6 (six) hours as needed for wheezing or shortness of breath. 1 Inhaler 0  . aspirin 81 MG chewable tablet Chew 1 tablet (81 mg total) by mouth daily. 90 tablet 3  . atorvastatin (LIPITOR) 80 MG tablet Take 1 tablet (80 mg total) by mouth daily. 90 tablet 1  . carvedilol (COREG) 12.5 MG tablet Take 1 tablet (12.5 mg total) by mouth 2 (two)  times daily with a meal. 180 tablet 1  . fluticasone (FLONASE) 50 MCG/ACT nasal spray USE 2 SPRAYS IN EACH NOSTRIL EVERY DAY 48 g 1  . gabapentin (NEURONTIN) 300 MG capsule Take 1 capsule by mouth daily in the morning, 1 at noon and 2 at bedtime. (Patient taking differently: Take  300 mg by mouth 4 (four) times daily. Take one capsule by mouth daily in the morning, take one capsule by mouth at noon, then take 2 capsules by mouth at bedtime per patient.) 360 capsule 1  . isosorbide mononitrate (IMDUR) 30 MG 24 hr tablet Take 1 tablet (30 mg total) by mouth daily. 90 tablet 1  . linaclotide (LINZESS) 145 MCG CAPS capsule Take 145 mcg by mouth daily before breakfast.    . losartan (COZAAR) 25 MG tablet Take 1 tablet (25 mg total) by mouth daily. 90 tablet 1  . methocarbamol (ROBAXIN) 500 MG tablet Take 1 tablet (500 mg total) by mouth 2 (two) times daily as needed. (Patient taking differently: Take 500 mg by mouth 2 (two) times daily. )  2  . nitroGLYCERIN (NITROSTAT) 0.4 MG SL tablet Place 1 tablet (0.4 mg total) under the tongue every 5 (five) minutes as needed for chest pain (CP or SOB). 25 tablet 3  . oxyCODONE-acetaminophen (PERCOCET) 7.5-325 MG tablet Take 1 tablet by mouth at bedtime as needed. (Patient taking differently: Take 1 tablet by mouth at bedtime. ) 30 tablet 0  . pantoprazole (PROTONIX) 40 MG tablet Take 1 tablet (40 mg total) by mouth daily. TAKE 1 TABLET BY MOUTH ONCE DAILY 60 tablet 2  . potassium chloride SA (KLOR-CON) 20 MEQ tablet TAKE 1 TABLET (20 MEQ TOTAL) 2 (TWO) TIMES DAILY 180 tablet 1  . ticagrelor (BRILINTA) 90 MG TABS tablet Take 1 tablet (90 mg total) by mouth 2 (two) times daily. 180 tablet 1   No current facility-administered medications on file prior to visit.    BP 102/60 (BP Location: Right Arm, Patient Position: Sitting, Cuff Size: Large)   Pulse 68   Temp (!) 95.7 F (35.4 C) (Temporal)   Resp 18   Ht 5\' 6"  (1.676 m)   Wt 185 lb 12.8 oz (84.3 kg)   LMP  11/25/1994   SpO2 100%   BMI 29.99 kg/m    Objective:   Physical Exam Constitutional:      Appearance: She is well-developed.  Neck:     Thyroid: No thyromegaly.  Cardiovascular:     Rate and Rhythm: Normal rate and regular rhythm.     Heart sounds: Normal heart sounds. No murmur.  Pulmonary:     Effort: Pulmonary effort is normal. No respiratory distress.     Breath sounds: Normal breath sounds. No wheezing.  Musculoskeletal:     Cervical back: Neck supple.  Skin:    General: Skin is warm and dry.  Neurological:     Mental Status: She is alert and oriented to person, place, and time.  Psychiatric:        Behavior: Behavior normal.        Thought Content: Thought content normal.        Judgment: Judgment normal.           Assessment & Plan:  CAD- I encouraged her to move forward with her cardiac rehab and to remain out of work at this time. We will re-evaluate her in about 6 weeks to determine her readiness to return to work.    20 minutes spent on today's visit.

## 2019-12-02 NOTE — Progress Notes (Signed)
Patient will be starting rehab next week

## 2019-12-06 ENCOUNTER — Telehealth: Payer: Self-pay | Admitting: Family

## 2019-12-06 NOTE — Telephone Encounter (Signed)
Form sign by Dr. Lorelei Pont (Dr. Charlett Blake not in office), confirmation received patient should be ok to start rehab tomorrow.

## 2019-12-06 NOTE — Telephone Encounter (Signed)
Copied from [employer]. Topic: General - Other >> Dec 06, 2019  2:19 PM Keene Breath wrote: Reason for CRM: Called to ask if the referral fax was received for patient rehab.  They still do not have it and patient would like to start rehab as soon as possible.  Please call to confirm that it was received at [phone]

## 2019-12-07 ENCOUNTER — Encounter: Payer: Self-pay | Admitting: Family

## 2019-12-07 NOTE — Telephone Encounter (Signed)
Per cardiac rehab, form was received yesterday

## 2019-12-22 MED FILL — BRILINTA 90 MG TABLET: 90 | 30 days supply | Qty: 60 | Fill #1

## 2019-12-22 MED FILL — PANTOPRAZOLE SOD DR 40 MG T: 40 | 30 days supply | Qty: 60 | Fill #2

## 2019-12-23 ENCOUNTER — Other Ambulatory Visit: Payer: Self-pay | Admitting: Family Medicine

## 2019-12-23 DIAGNOSIS — J309 Allergic rhinitis, unspecified: Secondary | ICD-10-CM

## 2019-12-23 MED FILL — PROAIR HFA 90 MCG INHALER: 108 (90 BAS | 25 days supply | Qty: 9 | Fill #0

## 2019-12-27 ENCOUNTER — Other Ambulatory Visit: Payer: Self-pay | Admitting: Family

## 2019-12-27 DIAGNOSIS — I2510 Atherosclerotic heart disease of native coronary artery without angina pectoris: Secondary | ICD-10-CM | POA: Diagnosis not present

## 2019-12-28 MED FILL — GABAPENTIN 300 MG CAPSULE: 300 | 90 days supply | Qty: 360 | Fill #0

## 2019-12-30 DIAGNOSIS — I2510 Atherosclerotic heart disease of native coronary artery without angina pectoris: Secondary | ICD-10-CM | POA: Diagnosis not present

## 2020-01-03 DIAGNOSIS — I2510 Atherosclerotic heart disease of native coronary artery without angina pectoris: Secondary | ICD-10-CM | POA: Diagnosis not present

## 2020-01-05 DIAGNOSIS — I2510 Atherosclerotic heart disease of native coronary artery without angina pectoris: Secondary | ICD-10-CM | POA: Diagnosis not present

## 2020-01-06 DIAGNOSIS — I2510 Atherosclerotic heart disease of native coronary artery without angina pectoris: Secondary | ICD-10-CM | POA: Diagnosis not present

## 2020-01-10 DIAGNOSIS — I2510 Atherosclerotic heart disease of native coronary artery without angina pectoris: Secondary | ICD-10-CM | POA: Diagnosis not present

## 2020-01-11 ENCOUNTER — Ambulatory Visit: Payer: Medicare Other | Admitting: Family

## 2020-01-12 DIAGNOSIS — I2510 Atherosclerotic heart disease of native coronary artery without angina pectoris: Secondary | ICD-10-CM | POA: Diagnosis not present

## 2020-01-17 DIAGNOSIS — I2510 Atherosclerotic heart disease of native coronary artery without angina pectoris: Secondary | ICD-10-CM | POA: Diagnosis not present

## 2020-01-18 ENCOUNTER — Other Ambulatory Visit: Payer: Self-pay

## 2020-01-18 ENCOUNTER — Ambulatory Visit (INDEPENDENT_AMBULATORY_CARE_PROVIDER_SITE_OTHER): Payer: Medicare Other | Admitting: Family

## 2020-01-18 ENCOUNTER — Encounter: Payer: Self-pay | Admitting: Family

## 2020-01-18 VITALS — BP 141/64 | HR 62 | Temp 97.4°F | Resp 16 | Ht 66.0 in | Wt 187.0 lb

## 2020-01-18 DIAGNOSIS — I25118 Atherosclerotic heart disease of native coronary artery with other forms of angina pectoris: Secondary | ICD-10-CM | POA: Diagnosis not present

## 2020-01-18 DIAGNOSIS — I10 Essential (primary) hypertension: Secondary | ICD-10-CM

## 2020-01-18 LAB — BASIC METABOLIC PANEL
BUN: 9 mg/dL (ref 6–23)
CO2: 26 mEq/L (ref 19–32)
Calcium: 9.5 mg/dL (ref 8.4–10.5)
Chloride: 107 mEq/L (ref 96–112)
Creatinine, Ser: 0.8 mg/dL (ref 0.40–1.20)
GFR: 86.98 mL/min (ref >60.00)
Glucose, Bld: 92 mg/dL (ref 70–99)
Potassium: 4.1 mEq/L (ref 3.5–5.1)
Sodium: 140 mEq/L (ref 135–145)

## 2020-01-18 MED ORDER — POTASSIUM CHLORIDE CRYS ER 20 MEQ PO TBCR: 20.0000 meq | EXTENDED_RELEASE_TABLET | Freq: Every day | ORAL | 1 refills | Status: DC

## 2020-01-18 NOTE — Progress Notes (Signed)
Subjective:    Patient ID: Jade [name], female    DOB: 1956/01/17, 64 y.o.   MRN: [id number]  HPI  Patient is a 64 yr old female who presents today for follow up. She suffered NSTEMI back in 11/20 with placement of 3 stents.  Following this MI she had significant SOB with exertion.  She was referred to cardiac rehab at [facility]. She would like to return to work on Monday. Still has some SOB but overall improving and is having chest pain very rarely at this point. She would like to return to work next week.   Wt Readings from Last 3 Encounters:  01/18/20 187 lb (84.8 kg)  11/30/19 185 lb 12.8 oz (84.3 kg)  11/09/19 181 lb (82.1 kg)   Reports some left shoulder pain.  Has hx of right rotator cuff repair.   Review of Systems    see HPI  Past Medical History:  Diagnosis Date  . Benign microscopic hematuria    neg cystoscopy 11/12- dr. Janice Norrie  . DJD (degenerative joint disease)    L KNEE  . Family history of anesthesia complication    multiple family members with history of this  . GERD (gastroesophageal reflux disease)   . Hurthle cell neoplasm of thyroid   . Hyperlipidemia LDL goal <70 10/23/2019  . Hypertension   . Malignant hyperthermia   . NSTEMI (non-ST elevated myocardial infarction) ([place]) 10/22/2019  . Numbness and tingling in left arm      Social History   Socioeconomic History  . Marital status: Married    Spouse name: Not on file  . Number of children: 3  . Years of education: Not on file  . Highest education level: Not on file  Occupational History  . Not on file  Tobacco Use  . Smoking status: Current Every Day Smoker    Packs/day: 1.00    Years: 39.00    Pack years: 39.00    Types: Cigarettes  . Smokeless tobacco: Never Used  Substance and Sexual Activity  . Alcohol use: No    Alcohol/week: 0.0 standard drinks  . Drug use: No  . Sexual activity: Yes    Partners: Male  Other Topics Concern  . Not on file  Social History Narrative   Regular  exercise:  No   Caffeine Use:  2 cups coffee and 3-4 glasses of soda daily.   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   Social Determinants of Health   Financial Resource Strain:   . Difficulty of Paying Living Expenses: Not on file  Food Insecurity:   . Worried About Charity fundraiser in the Last Year: Not on file  . Ran Out of Food in the Last Year: Not on file  Transportation Needs:   . Lack of Transportation (Medical): Not on file  . Lack of Transportation (Non-Medical): Not on file  Physical Activity:   . Days of Exercise per Week: Not on file  . Minutes of Exercise per Session: Not on file  Stress:   . Feeling of Stress : Not on file  Social Connections:   . Frequency of Communication with Friends and Family: Not on file  . Frequency of Social Gatherings with Friends and Family: Not on file  . Attends Religious Services: Not on file  . Active Member of Clubs or Organizations: Not on file  . Attends Archivist Meetings:  Not on file  . Marital Status: Not on file  Intimate Partner Violence:   . Fear of Current or Ex-Partner: Not on file  . Emotionally Abused: Not on file  . Physically Abused: Not on file  . Sexually Abused: Not on file    Past Surgical History:  Procedure Laterality Date  . ABDOMINAL HYSTERECTOMY  1996  . BREAST EXCISIONAL BIOPSY Left 01/2016  . BREAST EXCISIONAL BIOPSY Left 03/2016  . CERVICAL DISC SURGERY  2013   Dr. Arnoldo Morale  . CORONARY BALLOON ANGIOPLASTY N/A 10/25/2019   Procedure: CORONARY BALLOON ANGIOPLASTY;  Surgeon: Troy Sine, MD;  Location: [place] [facility];  Service: Cardiovascular;  Laterality: N/A;  . CORONARY STENT INTERVENTION N/A 10/22/2019   Procedure: CORONARY STENT INTERVENTION;  Surgeon: Troy Sine, MD;  Location: [place] [facility];  Service: Cardiovascular;  Laterality: N/A;  mid lad   . CORONARY STENT INTERVENTION N/A 10/25/2019   Procedure:  CORONARY STENT INTERVENTION;  Surgeon: Troy Sine, MD;  Location: [place] [facility];  Service: Cardiovascular;  Laterality: N/A;  . DILATION AND CURETTAGE OF UTERUS    . KNEE CARTILAGE SURGERY  1999   right knee  . LEFT HEART CATH AND CORONARY ANGIOGRAPHY N/A 10/22/2019   Procedure: LEFT HEART CATH AND CORONARY ANGIOGRAPHY;  Surgeon: Troy Sine, MD;  Location: [place] [facility];  Service: Cardiovascular;  Laterality: N/A;  . ROTATOR CUFF REPAIR Right 05/25/13  . 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 TOTAL KNEE ARTHROPLASTY Right 01/19/2015   Procedure: RIGHT TOTAL KNEE ARTHROPLASTY;  Surgeon: Johnn Hai, MD;  Location: [facility];  Service: Orthopedics;  Laterality: Right;  . TUBAL LIGATION      Family History  Problem Relation Age of Onset  . Hypertension Mother   . Hyperlipidemia Mother   . Heart disease Father        CAD; stent at age 19  . Diabetes Father   . Hypertension Father   . Hyperlipidemia Father   . Cancer Father        prostate  . Dementia Father   . Cancer Paternal Aunt        BREAST  . Breast cancer Paternal Aunt     Allergies  Allergen Reactions  . Shrimp [Shellfish Allergy] Itching    Rash and lips itching  . Ace Inhibitors Cough    Cough  . Aspirin Nausea And Vomiting  . Azithromycin Rash    Current Outpatient Medications on File Prior to Visit  Medication Sig Dispense Refill  . aspirin 81 MG chewable tablet Chew 1 tablet (81 mg total) by mouth daily. 90 tablet 3  . atorvastatin (LIPITOR) 80 MG tablet Take 1 tablet (80 mg total) by mouth daily. 90 tablet 1  . carvedilol (COREG) 12.5 MG tablet Take 1 tablet (12.5 mg total) by mouth 2 (two) times daily with a meal. 180 tablet 1  . fluticasone (FLONASE) 50 MCG/ACT nasal spray USE 2 SPRAYS IN EACH NOSTRIL EVERY DAY 48 g 1  . gabapentin (NEURONTIN) 300 MG  capsule Take 1 capsule by mouth daily in the morning, 1 at noon and 2 at bedtime. 360 capsule 1  . isosorbide mononitrate (IMDUR) 30 MG 24 hr tablet Take 1 tablet (30 mg total) by mouth daily. 90 tablet 1  . linaclotide (LINZESS) 145 MCG CAPS capsule Take  145 mcg by mouth daily before breakfast.    . losartan (COZAAR) 25 MG tablet Take 1 tablet (25 mg total) by mouth daily. 90 tablet 1  . methocarbamol (ROBAXIN) 500 MG tablet Take 1 tablet (500 mg total) by mouth 2 (two) times daily as needed. (Patient taking differently: Take 500 mg by mouth 2 (two) times daily. )  2  . nitroGLYCERIN (NITROSTAT) 0.4 MG SL tablet Place 1 tablet (0.4 mg total) under the tongue every 5 (five) minutes as needed for chest pain (CP or SOB). 25 tablet 3  . oxyCODONE-acetaminophen (PERCOCET) 7.5-325 MG tablet Take 1 tablet by mouth at bedtime as needed. (Patient taking differently: Take 1 tablet by mouth at bedtime. ) 30 tablet 0  . pantoprazole (PROTONIX) 40 MG tablet Take 1 tablet (40 mg total) by mouth daily. TAKE 1 TABLET BY MOUTH ONCE DAILY 60 tablet 2  . potassium chloride SA (KLOR-CON) 20 MEQ tablet TAKE 1 TABLET (20 MEQ TOTAL) 2 (TWO) TIMES DAILY 180 tablet 1  . ticagrelor (BRILINTA) 90 MG TABS tablet Take 1 tablet (90 mg total) by mouth 2 (two) times daily. 180 tablet 1  . VENTOLIN HFA 108 (90 Base) MCG/ACT inhaler INHALE 2 PUFFS INTO THE LUNGS EVERY 6 HOURS AS NEEDED FOR WHEEZING OR SHORTNESS OF BREATH. 18 g 0   No current facility-administered medications on file prior to visit.    BP (!) 141/64 (BP Location: Right Arm, Patient Position: Sitting, Cuff Size: Small)   Pulse 62   Temp (!) 97.4 F (36.3 C) (Temporal)   Resp 16   Ht 5\' 6"  (1.676 m)   Wt 187 lb (84.8 kg)   LMP 11/25/1994   SpO2 100%   BMI 30.18 kg/m    Objective:   Physical Exam Constitutional:      Appearance: She is well-developed.  Neck:     Thyroid: No thyromegaly.  Cardiovascular:     Rate and Rhythm: Normal rate and regular  rhythm.     Heart sounds: Normal heart sounds. No murmur.  Pulmonary:     Effort: Pulmonary effort is normal. No respiratory distress.     Breath sounds: Normal breath sounds. No wheezing.  Skin:    General: Skin is warm and dry.  Neurological:     Mental Status: She is alert and oriented to person, place, and time.  Psychiatric:        Behavior: Behavior normal.        Thought Content: Thought content normal.        Judgment: Judgment normal.           Assessment & Plan:  CAD- clinically stable. Discussed heart healthy diet. She continues cardiac rehab which should conclude in April. Advised OK to return to work part time for first 2 weeks and then 5 days a week on week three. Will limit her lifting to 5 pounds until she has completed cardiac rehab.  HTN- check follow up bmet to assess K+. States she is only taking 1 tab of Kdur.   20 minutes spent on today's visit.   This visit occurred during the SARS-CoV-2 public health emergency.  Safety protocols were in place, including screening questions prior to the visit, additional usage of staff PPE, and extensive cleaning of exam room while observing appropriate contact time as indicated for disinfecting solutions.

## 2020-01-18 NOTE — Patient Instructions (Signed)
Please complete lab work prior to leaving.   

## 2020-01-19 ENCOUNTER — Telehealth: Payer: Self-pay

## 2020-01-19 DIAGNOSIS — I2510 Atherosclerotic heart disease of native coronary artery without angina pectoris: Secondary | ICD-10-CM | POA: Diagnosis not present

## 2020-01-19 MED FILL — BRILINTA 90 MG TABLET: 90 | 30 days supply | Qty: 60 | Fill #2

## 2020-01-19 NOTE — Telephone Encounter (Signed)
Patient called in to see if Dr. Conley Canal or the nurse can give her a call about her work excuse.   Please follow up with the patient at [phone]  Thanks,

## 2020-01-19 NOTE — Telephone Encounter (Signed)
Ok to change. Thanks

## 2020-01-20 ENCOUNTER — Encounter: Payer: Self-pay | Admitting: Family

## 2020-01-20 DIAGNOSIS — I2510 Atherosclerotic heart disease of native coronary artery without angina pectoris: Secondary | ICD-10-CM | POA: Diagnosis not present

## 2020-01-20 MED FILL — NITROGLYCERIN 0.4 MG TAB SL: 0.4 | 7 days supply | Qty: 25 | Fill #1

## 2020-01-20 NOTE — Telephone Encounter (Signed)
Note changed and printed out for patient, she will pick up today

## 2020-01-24 DIAGNOSIS — I2510 Atherosclerotic heart disease of native coronary artery without angina pectoris: Secondary | ICD-10-CM | POA: Diagnosis not present

## 2020-01-26 DIAGNOSIS — I2510 Atherosclerotic heart disease of native coronary artery without angina pectoris: Secondary | ICD-10-CM | POA: Diagnosis not present

## 2020-01-27 ENCOUNTER — Ambulatory Visit: Payer: Medicare Other | Admitting: Cardiology

## 2020-01-27 DIAGNOSIS — I2510 Atherosclerotic heart disease of native coronary artery without angina pectoris: Secondary | ICD-10-CM | POA: Diagnosis not present

## 2020-01-31 DIAGNOSIS — I2510 Atherosclerotic heart disease of native coronary artery without angina pectoris: Secondary | ICD-10-CM | POA: Diagnosis not present

## 2020-02-02 DIAGNOSIS — I2510 Atherosclerotic heart disease of native coronary artery without angina pectoris: Secondary | ICD-10-CM | POA: Diagnosis not present

## 2020-02-03 DIAGNOSIS — I2510 Atherosclerotic heart disease of native coronary artery without angina pectoris: Secondary | ICD-10-CM | POA: Diagnosis not present

## 2020-02-07 DIAGNOSIS — I2510 Atherosclerotic heart disease of native coronary artery without angina pectoris: Secondary | ICD-10-CM | POA: Diagnosis not present

## 2020-02-09 DIAGNOSIS — I2510 Atherosclerotic heart disease of native coronary artery without angina pectoris: Secondary | ICD-10-CM | POA: Diagnosis not present

## 2020-02-10 DIAGNOSIS — I2510 Atherosclerotic heart disease of native coronary artery without angina pectoris: Secondary | ICD-10-CM | POA: Diagnosis not present

## 2020-02-14 DIAGNOSIS — I2510 Atherosclerotic heart disease of native coronary artery without angina pectoris: Secondary | ICD-10-CM | POA: Diagnosis not present

## 2020-02-16 ENCOUNTER — Other Ambulatory Visit: Payer: Self-pay | Admitting: Family

## 2020-02-16 MED FILL — ATORVASTATIN 80 MG TABLET: 80 | 90 days supply | Qty: 90 | Fill #1

## 2020-02-16 MED FILL — ISOSORBIDE MN ER 30 MG TAB: 30 | 90 days supply | Qty: 90 | Fill #1

## 2020-02-16 MED FILL — LOSARTAN POTASSIUM 25 MG TA: 25 | 90 days supply | Qty: 90 | Fill #1

## 2020-02-16 MED FILL — BRILINTA 90 MG TABLET: 90 | 30 days supply | Qty: 60 | Fill #3

## 2020-02-16 MED FILL — PANTOPRAZOLE SOD DR 40 MG T: 40 | 30 days supply | Qty: 60 | Fill #0

## 2020-02-16 MED FILL — NITROGLYCERIN 0.4 MG TAB SL: 0.4 | 7 days supply | Qty: 25 | Fill #2

## 2020-02-16 MED FILL — CARVEDILOL 12.5 MG TABLET: 12.5 | 90 days supply | Qty: 180 | Fill #1

## 2020-02-21 DIAGNOSIS — I2510 Atherosclerotic heart disease of native coronary artery without angina pectoris: Secondary | ICD-10-CM | POA: Diagnosis not present

## 2020-02-23 DIAGNOSIS — I2510 Atherosclerotic heart disease of native coronary artery without angina pectoris: Secondary | ICD-10-CM | POA: Diagnosis not present

## 2020-02-25 ENCOUNTER — Encounter: Payer: Self-pay | Admitting: Cardiology

## 2020-02-25 ENCOUNTER — Other Ambulatory Visit: Payer: Self-pay

## 2020-02-25 ENCOUNTER — Ambulatory Visit: Payer: Medicare Other | Admitting: Cardiology

## 2020-02-25 VITALS — BP 142/72 | HR 73 | Ht 66.0 in | Wt 192.8 lb

## 2020-02-25 DIAGNOSIS — I255 Ischemic cardiomyopathy: Secondary | ICD-10-CM | POA: Diagnosis not present

## 2020-02-25 DIAGNOSIS — I10 Essential (primary) hypertension: Secondary | ICD-10-CM | POA: Diagnosis not present

## 2020-02-25 DIAGNOSIS — I2510 Atherosclerotic heart disease of native coronary artery without angina pectoris: Secondary | ICD-10-CM | POA: Diagnosis not present

## 2020-02-25 DIAGNOSIS — E785 Hyperlipidemia, unspecified: Secondary | ICD-10-CM | POA: Diagnosis not present

## 2020-02-25 DIAGNOSIS — R1031 Right lower quadrant pain: Secondary | ICD-10-CM | POA: Diagnosis not present

## 2020-02-25 NOTE — Patient Instructions (Signed)
Medication Instructions:  Nn medication changes *If you need a refill on your cardiac medications before your next appointment, please call your pharmacy*   Lab Work: None ordered If you have labs (blood work) drawn today and your tests are completely normal, you will receive your results only by: Marland Kitchen MyChart Message (if you have MyChart) OR . A paper copy in the mail If you have any lab test that is abnormal or we need to change your treatment, we will call you to review the results.   Testing/Procedures: None ordered   Follow-Up: At [facility], you and your health needs are our priority.  As part of our continuing mission to provide you with exceptional heart care, we have created designated Provider Care Teams.  These Care Teams include your primary Cardiologist (physician) and Advanced Practice Providers (APPs -  Physician Assistants and Nurse Practitioners) who all work together to provide you with the care you need, when you need it.  We recommend signing up for the patient portal called "MyChart".  Sign up information is provided on this After Visit Summary.  MyChart is used to connect with patients for Virtual Visits (Telemedicine).  Patients are able to view lab/test results, encounter notes, upcoming appointments, etc.  Non-urgent messages can be sent to your provider as well.   To learn more about what you can do with MyChart, go to [email or web address].    Your next appointment:   3 month(s)  The format for your next appointment:   In Person  Provider:   Jenne Campus, MD, Shirlee More, MD, Jyl Heinz, MD or Laurann Montana, FNP [place] Office)   Other Instructions NA

## 2020-02-25 NOTE — Progress Notes (Signed)
Cardiology Office Note:    Date:  02/25/2020   ID:  Jade [name], DOB [birth year], MRN [id number]  PCP:  Debbrah Alar, NP  Cardiologist:  Kirk Ruths, MD  Electrophysiologist:  None   Referring MD: Debbrah Alar, NP   Follow up visit  History of Present Illness:    Jade [name] is a 64 y.o. female with a hx of coronary artery disease with recent PCI (did receive 2 stents in her LAD on October 22, 2019 and then a staged RCA PCI on October 25, 2019 with DES x2 to her RCA) as a result of a NSTEMI, Ischemic cardiomyopathy EF 45% in 09/2019,  hyperlipidemia, former smoker quit 2 weeks ago, hypertension, hyperlipidemia.   I did see the patient on 11/03/2019 post hospitalization, at that time she reported that she was at her baseline shortness of breath. I advised her that she will need to be on her DAPT for minimum 12 months. During our initial visit she reported that she was at her baseline and had been working previously restrictions therefore she was ready to go back to work. I also added Zetia 10 mg to her Lipitor 80mg  to help achieve a goal of LDL < 70. All of her questions were answered at that time.   In the interim the patient called upset and noted that after discussing with her pcp she preferred to do cardia rehab. RX was sent for cardiac rehab. At that time she also further requested that she no longer wanted to see me in clinic and requested another cardiologist.   Today the patient presents for a follow visit. Upon my entry in the room, the patient stated to me " I am not here to see you, I requested another physician":  I  Noted to the patient that I was the only cardiologist in the building and I will be happy to facilitate her visit with another cardiologist for next week or later. Once this was mentioned that patient noted that since she was already present - she will let me facilitate her visit. With this I requested that my nurse or CMA be present in the room  for this.   I did ask the [place] working with me to come to the room as a chaperone to the visit. The patient then explained that she started to experienced tenderness in her right groin after using the bike at cardiac rehab and she believe this to be from the [facility] site. She stated that this only started while in cardiac rehab once she started the bike.   She denies chest pain, Shortness of breath, lightheadedness, dizziness, or leg pain.  Of note she is back to work and also is doing cardiac rehab.   Past Medical History:  Diagnosis Date  . Benign microscopic hematuria    neg cystoscopy 11/12- dr. Janice Norrie  . DJD (degenerative joint disease)    L KNEE  . Family history of anesthesia complication    multiple family members with history of this  . GERD (gastroesophageal reflux disease)   . Hurthle cell neoplasm of thyroid   . Hyperlipidemia LDL goal <70 10/23/2019  . Hypertension   . Malignant hyperthermia   . NSTEMI (non-ST elevated myocardial infarction) ([place]) 10/22/2019  . Numbness and tingling in left arm     Past Surgical History:  Procedure Laterality Date  . ABDOMINAL HYSTERECTOMY  1996  . BREAST EXCISIONAL BIOPSY Left 01/2016  . BREAST EXCISIONAL BIOPSY Left 03/2016  . CERVICAL DISC  SURGERY  2013   Dr. Arnoldo Morale  . CORONARY BALLOON ANGIOPLASTY N/A 10/25/2019   Procedure: CORONARY BALLOON ANGIOPLASTY;  Surgeon: Troy Sine, MD;  Location: [place] [facility];  Service: Cardiovascular;  Laterality: N/A;  . CORONARY STENT INTERVENTION N/A 10/22/2019   Procedure: CORONARY STENT INTERVENTION;  Surgeon: Troy Sine, MD;  Location: [place] [facility];  Service: Cardiovascular;  Laterality: N/A;  mid lad   . CORONARY STENT INTERVENTION N/A 10/25/2019   Procedure: CORONARY STENT INTERVENTION;  Surgeon: Troy Sine, MD;  Location: [place] [facility];  Service: Cardiovascular;  Laterality: N/A;  . DILATION AND CURETTAGE OF UTERUS    . KNEE CARTILAGE SURGERY  1999   right knee    . LEFT HEART CATH AND CORONARY ANGIOGRAPHY N/A 10/22/2019   Procedure: LEFT HEART CATH AND CORONARY ANGIOGRAPHY;  Surgeon: Troy Sine, MD;  Location: [place] [facility];  Service: Cardiovascular;  Laterality: N/A;  . ROTATOR CUFF REPAIR Right 05/25/13  . 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 TOTAL KNEE ARTHROPLASTY Right 01/19/2015   Procedure: RIGHT TOTAL KNEE ARTHROPLASTY;  Surgeon: Johnn Hai, MD;  Location: [facility];  Service: Orthopedics;  Laterality: Right;  . TUBAL LIGATION      Current Medications: Current Meds  Medication Sig  . aspirin 81 MG chewable tablet Chew 1 tablet (81 mg total) by mouth daily.  Marland Kitchen atorvastatin (LIPITOR) 80 MG tablet Take 1 tablet (80 mg total) by mouth daily.  . carvedilol (COREG) 12.5 MG tablet Take 1 tablet (12.5 mg total) by mouth 2 (two) times daily with a meal.  . fluticasone (FLONASE) 50 MCG/ACT nasal spray USE 2 SPRAYS IN EACH NOSTRIL EVERY DAY  . gabapentin (NEURONTIN) 300 MG capsule Take 1 capsule by mouth daily in the morning, 1 at noon and 2 at bedtime.  . isosorbide mononitrate (IMDUR) 30 MG 24 hr tablet Take 1 tablet (30 mg total) by mouth daily.  Marland Kitchen linaclotide (LINZESS) 145 MCG CAPS capsule Take 145 mcg by mouth daily before breakfast.  . losartan (COZAAR) 25 MG tablet Take 1 tablet (25 mg total) by mouth daily.  . methocarbamol (ROBAXIN) 500 MG tablet Take 1 tablet (500 mg total) by mouth 2 (two) times daily as needed.  . nitroGLYCERIN (NITROSTAT) 0.4 MG SL tablet Place 1 tablet (0.4 mg total) under the tongue every 5 (five) minutes as needed for chest pain (CP or SOB).  Marland Kitchen oxyCODONE-acetaminophen (PERCOCET) 7.5-325 MG tablet Take 1 tablet by mouth at bedtime as needed.  . pantoprazole (PROTONIX) 40 MG tablet TAKE 1 TABLET (40 MG TOTAL) BY MOUTH 2 (TWO) TIMES DAILY.  Marland Kitchen potassium chloride SA (KLOR-CON)  20 MEQ tablet Take 20 mEq by mouth daily.  . ticagrelor (BRILINTA) 90 MG TABS tablet Take 1 tablet (90 mg total) by mouth 2 (two) times daily.  . VENTOLIN HFA 108 (90 Base) MCG/ACT inhaler INHALE 2 PUFFS INTO THE LUNGS EVERY 6 HOURS AS NEEDED FOR WHEEZING OR SHORTNESS OF BREATH.     Allergies:   Shrimp [shellfish allergy], Ace inhibitors, Aspirin, and Azithromycin   Social History   Socioeconomic History  . Marital status: Married    Spouse name: Not on file  . Number of children: 3  . Years of education: Not on file  . Highest education level: Not on file  Occupational History  .  Not on file  Tobacco Use  . Smoking status: Former Smoker    Packs/day: 1.00    Years: 39.00    Pack years: 39.00    Types: Cigarettes    Quit date: 11/2019    Years since quitting: 0.2  . Smokeless tobacco: Never Used  Substance and Sexual Activity  . Alcohol use: No    Alcohol/week: 0.0 standard drinks  . Drug use: No  . Sexual activity: Yes    Partners: Male  Other Topics Concern  . Not on file  Social History Narrative   Regular exercise:  No   Caffeine Use:  2 cups coffee and 3-4 glasses of soda daily.   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   Social Determinants of Health   Financial Resource Strain:   . Difficulty of Paying Living Expenses:   Food Insecurity:   . Worried About Charity fundraiser in the Last Year:   . Arboriculturist in the Last Year:   Transportation Needs:   . Film/video editor (Medical):   Marland Kitchen Lack of Transportation (Non-Medical):   Physical Activity:   . Days of Exercise per Week:   . Minutes of Exercise per Session:   Stress:   . Feeling of Stress :   Social Connections:   . Frequency of Communication with Friends and Family:   . Frequency of Social Gatherings with Friends and Family:   . Attends Religious Services:   . Active Member of Clubs or Organizations:   . Attends Theatre manager Meetings:   Marland Kitchen Marital Status:      Family History: The patient's family history includes Breast cancer in her paternal aunt; Cancer in her father and paternal aunt; Dementia in her father; Diabetes in her father; Heart disease in her father; Hyperlipidemia in her father and mother; Hypertension in her father and mother.  ROS:   Review of Systems  Constitution: Negative for decreased appetite, fever and weight gain.  HENT: Negative for congestion, ear discharge, hoarse voice and sore throat.   Eyes: Negative for discharge, redness, vision loss in right eye and visual halos.  Cardiovascular: Negative for chest pain, dyspnea on exertion, leg swelling, orthopnea and palpitations.  Respiratory: Negative for cough, hemoptysis, shortness of breath and snoring.   Endocrine: Negative for heat intolerance and polyphagia.  Hematologic/Lymphatic: Negative for bleeding problem. Does not bruise/bleed easily.  Skin: Negative for flushing, nail changes, rash and suspicious lesions.  Musculoskeletal: Negative for arthritis, joint pain, muscle cramps, myalgias, neck pain and stiffness.  Gastrointestinal: Negative for abdominal pain, bowel incontinence, diarrhea and excessive appetite.  Genitourinary: Report pain in the right groin. Negative for decreased libido, genital sores and incomplete emptying.  Neurological: Negative for brief paralysis, focal weakness, headaches and loss of balance.  Psychiatric/Behavioral: Negative for altered mental status, depression and suicidal ideas.  Allergic/Immunologic: Negative for HIV exposure and persistent infections.    EKGs/Labs/Other Studies Reviewed:    The following studies were reviewed today:   EKG:  None today   TTE IMPRESSIONS 10/22/2019 1. Left ventricular ejection fraction, by visual estimation, is 45%. The left ventricle has mild to moderately decreased function. There is no left ventricular hypertrophy. 2. LVEF is approximately 45%  with akinesis of the apex and distal inferior wall. Overall improved from echo on 10/22/19.  3. Global right ventricle has normal systolic function.The right ventricular size is normal. No  increase in right ventricular wall thickness. 4. Left atrial size was normal. 5. Right atrial size was normal. 6. The mitral valve is normal in structure. Mild mitral valve regurgitation. 7. The tricuspid valve is normal in structure. Tricuspid valve regurgitation is mild. 8. The aortic valve is tricuspid. Aortic valve regurgitation is not visualized. 9. The pulmonic valve was normal in structure. Pulmonic valve regurgitation is not visualized. 10. Normal pulmonary artery systolic pressure.   Recent Labs: 10/21/2019: ALT 20 10/26/2019: Hemoglobin 11.6; Platelets 343 11/03/2019: Magnesium 1.8 01/18/2020: BUN 9; Creatinine, Ser 0.80; Potassium 4.1; Sodium 140  Recent Lipid Panel    Component Value Date/Time   CHOL 204 (H) 10/23/2019 0341   TRIG 177 (H) 10/23/2019 0341   HDL 27 (L) 10/23/2019 0341   CHOLHDL 7.6 10/23/2019 0341   VLDL 35 10/23/2019 0341   LDLCALC 142 (H) 10/23/2019 0341   LDLDIRECT 175.0 08/10/2018 1209    Physical Exam:    VS:  BP (!) 142/72   Pulse 73   Ht 5\' 6"  (1.676 m)   Wt 192 lb 12.8 oz (87.5 kg)   LMP 11/25/1994   SpO2 96%   BMI 31.12 kg/m     Wt Readings from Last 3 Encounters:  02/25/20 192 lb 12.8 oz (87.5 kg)  01/18/20 187 lb (84.8 kg)  11/30/19 185 lb 12.8 oz (84.3 kg)    Chaperone present (CMA Karena Addison) GEN: Well nourished, well developed in no acute distress HEENT: Normal NECK: No JVD; No carotid bruits LYMPHATICS: No lymphadenopathy CARDIAC: S1S2 noted,RRR, no murmurs, rubs, gallops RESPIRATORY:  Clear to auscultation without rales, wheezing or rhonchi  ABDOMEN: Soft, non-tender, non-distended, +bowel sounds, no guarding. EXTREMITIES: No edema, No cyanosis, no clubbing MUSCULOSKELETAL:  No deformity  SKIN: Warm and dry NEUROLOGIC:   Alert and oriented x 3, non-focal PSYCHIATRIC:  Normal affect, good insight Right groin- pinpoint tenderness at 1 finger breath beneath and  laterally below the inguinal ligament.  ASSESSMENT:    1. Groin pain, right   2. Essential hypertension   3. CAD in native artery s/p DES LAD, DES RCA 10/26/19   4. Hyperlipidemia LDL goal <70   5. Ischemic cardiomyopathy    PLAN:     1. Based on the physical exam I highly suspect neuropathic pain from the femoral nerve. She admits to history of spinal compression and has had nerve pain. I recommended she see her pcp but the patient noted that she will see her orthopedic doctor who takes care of her bulging disc.   2. She prefer not to discuss any other further detail of her other cardiovascular conditions with me.   The patient is in agreement with the above plan. The patient left the office in stable condition.  The patient will follow up with another cardiologist - I offered to set her up with another physician in the practise as I educated her that given her CV condition it is important she follow up.    Medication Adjustments/Labs and Tests Ordered: Current medicines are reviewed at length with the patient today.  Concerns regarding medicines are outlined above.  No orders of the defined types were placed in this encounter.  No orders of the defined types were placed in this encounter.   Patient Instructions  Medication Instructions:  Nn medication changes *If you need a refill on your cardiac medications before your next appointment, please call your pharmacy*   Lab Work: None ordered If you have labs (blood work) drawn today and  your tests are completely normal, you will receive your results only by: Marland Kitchen MyChart Message (if you have MyChart) OR . A paper copy in the mail If you have any lab test that is abnormal or we need to change your treatment, we will call you to review the results.   Testing/Procedures: None  ordered   Follow-Up: At [facility], you and your health needs are our priority.  As part of our continuing mission to provide you with exceptional heart care, we have created designated Provider Care Teams.  These Care Teams include your primary Cardiologist (physician) and Advanced Practice Providers (APPs -  Physician Assistants and Nurse Practitioners) who all work together to provide you with the care you need, when you need it.  We recommend signing up for the patient portal called "MyChart".  Sign up information is provided on this After Visit Summary.  MyChart is used to connect with patients for Virtual Visits (Telemedicine).  Patients are able to view lab/test results, encounter notes, upcoming appointments, etc.  Non-urgent messages can be sent to your provider as well.   To learn more about what you can do with MyChart, go to [email or web address].    Your next appointment:   3 month(s)  The format for your next appointment:   In Person  Provider:   Jenne Campus, MD, Shirlee More, MD, Jyl Heinz, MD or Laurann Montana, FNP [facility] Office)   Other Instructions NA     Adopting a Healthy Lifestyle.  Know what a healthy weight is for you (roughly BMI <25) and aim to maintain this   Aim for 7+ servings of fruits and vegetables daily   65-80+ fluid ounces of water or unsweet tea for healthy kidneys   Limit to max 1 drink of alcohol per day; avoid smoking/tobacco   Limit animal fats in diet for cholesterol and heart health - choose grass fed whenever available   Avoid highly processed foods, and foods high in saturated/trans fats   Aim for low stress - take time to unwind and care for your mental health   Aim for 150 min of moderate intensity exercise weekly for heart health, and weights twice weekly for bone health   Aim for 7-9 hours of sleep daily   When it comes to diets, agreement about the perfect plan isnt easy to find, even among the  experts. Experts at the [place] developed an idea known as the Healthy Eating Plate. Just imagine a plate divided into logical, healthy portions.   The emphasis is on diet quality:   Load up on vegetables and fruits - one-half of your plate: Aim for color and variety, and remember that potatoes dont count.   Go for whole grains - one-quarter of your plate: Whole wheat, barley, wheat berries, quinoa, oats, brown rice, and foods made with them. If you want pasta, go with whole wheat pasta.   Protein power - one-quarter of your plate: Fish, chicken, beans, and nuts are all healthy, versatile protein sources. Limit red meat.   The diet, however, does go beyond the plate, offering a few other suggestions.   Use healthy plant oils, such as olive, canola, soy, corn, sunflower and peanut. Check the labels, and avoid partially hydrogenated oil, which have unhealthy trans fats.   If youre thirsty, drink water. Coffee and tea are good in moderation, but skip sugary drinks and limit milk and dairy products to one or two daily servings.   The type  of carbohydrate in the diet is more important than the amount. Some sources of carbohydrates, such as vegetables, fruits, whole grains, and beans-are healthier than others.   Finally, stay active  Signed, Berniece Salines, DO  02/25/2020 10:19 PM    [facility]

## 2020-02-29 ENCOUNTER — Ambulatory Visit (HOSPITAL_COMMUNITY)
Admission: RE | Admit: 2020-02-29 | Discharge: 2020-02-29 | Disposition: A | Discharge status: Home / Self Care | Payer: Medicare Other | Source: Ambulatory Visit | Attending: Cardiology | Admitting: Cardiology

## 2020-02-29 ENCOUNTER — Other Ambulatory Visit: Payer: Self-pay

## 2020-02-29 ENCOUNTER — Other Ambulatory Visit (HOSPITAL_COMMUNITY): Payer: Self-pay | Admitting: Specialist

## 2020-02-29 DIAGNOSIS — M79604 Pain in right leg: Secondary | ICD-10-CM | POA: Insufficient documentation

## 2020-02-29 DIAGNOSIS — M79661 Pain in right lower leg: Secondary | ICD-10-CM | POA: Diagnosis not present

## 2020-02-29 DIAGNOSIS — M79662 Pain in left lower leg: Secondary | ICD-10-CM | POA: Diagnosis not present

## 2020-02-29 DIAGNOSIS — M79605 Pain in left leg: Secondary | ICD-10-CM | POA: Diagnosis not present

## 2020-02-29 DIAGNOSIS — Z96651 Presence of right artificial knee joint: Secondary | ICD-10-CM | POA: Diagnosis not present

## 2020-03-20 MED FILL — BRILINTA 90 MG TABLET: 90 | 30 days supply | Qty: 60 | Fill #4

## 2020-03-20 MED FILL — GABAPENTIN 300 MG CAPSULE: 300 | 90 days supply | Qty: 360 | Fill #1

## 2020-03-20 MED FILL — POTASSIUM CHLORIDE CRYS ER: 20 | 90 days supply | Qty: 180 | Fill #1

## 2020-03-27 DIAGNOSIS — M545 Low back pain: Secondary | ICD-10-CM | POA: Diagnosis not present

## 2020-03-27 DIAGNOSIS — Z79899 Other long term (current) drug therapy: Secondary | ICD-10-CM | POA: Diagnosis not present

## 2020-03-27 DIAGNOSIS — Z5181 Encounter for therapeutic drug level monitoring: Secondary | ICD-10-CM | POA: Diagnosis not present

## 2020-03-27 DIAGNOSIS — G894 Chronic pain syndrome: Secondary | ICD-10-CM | POA: Diagnosis not present

## 2020-03-29 MED FILL — PANTOPRAZOLE SOD DR 40 MG T: 40 | 30 days supply | Qty: 60 | Fill #1

## 2020-03-29 MED FILL — NITROGLYCERIN 0.4 MG TAB SL: 0.4 | 7 days supply | Qty: 25 | Fill #3

## 2020-04-11 DIAGNOSIS — R0602 Shortness of breath: Secondary | ICD-10-CM | POA: Diagnosis not present

## 2020-04-11 DIAGNOSIS — I2510 Atherosclerotic heart disease of native coronary artery without angina pectoris: Secondary | ICD-10-CM | POA: Diagnosis not present

## 2020-04-11 DIAGNOSIS — I255 Ischemic cardiomyopathy: Secondary | ICD-10-CM | POA: Diagnosis not present

## 2020-04-11 DIAGNOSIS — Z87891 Personal history of nicotine dependence: Secondary | ICD-10-CM | POA: Diagnosis not present

## 2020-04-11 DIAGNOSIS — E785 Hyperlipidemia, unspecified: Secondary | ICD-10-CM | POA: Diagnosis not present

## 2020-04-11 DIAGNOSIS — I10 Essential (primary) hypertension: Secondary | ICD-10-CM | POA: Diagnosis not present

## 2020-04-18 ENCOUNTER — Ambulatory Visit: Payer: Medicare Other | Admitting: Family

## 2020-04-21 ENCOUNTER — Ambulatory Visit: Payer: Medicare Other | Admitting: Family

## 2020-04-21 DIAGNOSIS — Z0289 Encounter for other administrative examinations: Secondary | ICD-10-CM

## 2020-04-25 ENCOUNTER — Telehealth: Payer: Self-pay | Admitting: Family

## 2020-04-25 ENCOUNTER — Ambulatory Visit (INDEPENDENT_AMBULATORY_CARE_PROVIDER_SITE_OTHER): Payer: Medicare Other | Admitting: Family

## 2020-04-25 ENCOUNTER — Other Ambulatory Visit: Payer: Self-pay

## 2020-04-25 ENCOUNTER — Encounter: Payer: Self-pay | Admitting: Family

## 2020-04-25 VITALS — BP 136/72 | HR 76 | Temp 97.6°F | Resp 18 | Ht 66.0 in | Wt 193.0 lb

## 2020-04-25 DIAGNOSIS — I2510 Atherosclerotic heart disease of native coronary artery without angina pectoris: Secondary | ICD-10-CM | POA: Diagnosis not present

## 2020-04-25 DIAGNOSIS — K219 Gastro-esophageal reflux disease without esophagitis: Secondary | ICD-10-CM | POA: Diagnosis not present

## 2020-04-25 DIAGNOSIS — G6289 Other specified polyneuropathies: Secondary | ICD-10-CM

## 2020-04-25 DIAGNOSIS — D34 Benign neoplasm of thyroid gland: Secondary | ICD-10-CM

## 2020-04-25 DIAGNOSIS — I10 Essential (primary) hypertension: Secondary | ICD-10-CM

## 2020-04-25 DIAGNOSIS — F17210 Nicotine dependence, cigarettes, uncomplicated: Secondary | ICD-10-CM

## 2020-04-25 DIAGNOSIS — Z720 Tobacco use: Secondary | ICD-10-CM

## 2020-04-25 DIAGNOSIS — N760 Acute vaginitis: Secondary | ICD-10-CM

## 2020-04-25 DIAGNOSIS — R7303 Prediabetes: Secondary | ICD-10-CM | POA: Diagnosis not present

## 2020-04-25 DIAGNOSIS — Z0000 Encounter for general adult medical examination without abnormal findings: Secondary | ICD-10-CM

## 2020-04-25 LAB — BASIC METABOLIC PANEL
BUN: 14 mg/dL (ref 6–23)
CO2: 25 mEq/L (ref 19–32)
Calcium: 9.1 mg/dL (ref 8.4–10.5)
Chloride: 106 mEq/L (ref 96–112)
Creatinine, Ser: 0.78 mg/dL (ref 0.40–1.20)
GFR: 89.49 mL/min (ref >60.00)
Glucose, Bld: 88 mg/dL (ref 70–99)
Potassium: 3.9 mEq/L (ref 3.5–5.1)
Sodium: 139 mEq/L (ref 135–145)

## 2020-04-25 LAB — TSH: TSH: 1.17 u[IU]/mL (ref 0.35–4.50)

## 2020-04-25 LAB — HEMOGLOBIN A1C: Hgb A1c MFr Bld: 6.4 % (ref 4.6–6.5)

## 2020-04-25 MED ORDER — LOSARTAN POTASSIUM 50 MG PO TABS: 50.0000 mg | ORAL_TABLET | Freq: Every day | ORAL | 3 refills | Status: DC

## 2020-04-25 MED ORDER — CHANTIX STARTING MONTH PAK 0.5 MG X 11 & 1 MG X 42 PO TABS: ORAL_TABLET | ORAL | 0 refills | Status: DC

## 2020-04-25 MED ORDER — FLUCONAZOLE 150 MG PO TABS: ORAL_TABLET | ORAL | 0 refills | Status: DC

## 2020-04-25 MED FILL — CHANTIX STARTING MONTH BOX: 0.5 MG X 11 | 28 days supply | Qty: 53 | Fill #0

## 2020-04-25 MED FILL — FLUCONAZOLE 150 MG TABS: 150 | 2 days supply | Qty: 2 | Fill #0

## 2020-04-25 NOTE — Telephone Encounter (Signed)
Could you please contact pt and let her know that I was reviewing her chart and wanted to know if she ever saw Dr.Gerkin Sales promotion account executive) for her thyroid.  We made a referral last summer. If not, I will re-initiate.

## 2020-04-25 NOTE — Progress Notes (Signed)
Subjective:    Patient ID: Jade [name], female    DOB: [birth year], 64 y.o.   MRN: [id number]  HPI   Patient is a 64 yr old female who presents today for follow up.  HTN- maintained on losartan which was increased from 25mg  to 50mg  by cardiology. She is also maintained on carvedilol. She is now following with Dr. Marijo File for cardiology. He increased her losartan.   BP Readings from Last 3 Encounters:  04/25/20 136/72  02/25/20 (!) 142/72  01/18/20 (!) 141/64   Hyperlipidemia- maintained on atorvastatin.  Lab Results  Component Value Date   CHOL 204 (H) 10/23/2019   HDL 27 (L) 10/23/2019   LDLCALC 142 (H) 10/23/2019   LDLDIRECT 175.0 08/10/2018   TRIG 177 (H) 10/23/2019   CHOLHDL 7.6 10/23/2019   CAD- She completed cardiac rehab. Denies chest pain.   GERD- maintained on PPI. Reports rare gerd symptoms.   C/o Vaginal itching. Denies discharge. Thinks she is developing a yeast infection.   Peripheral neuropathy- continues gabapentin.  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 Afirma testing was low risk (benign). A referral was made to Dr. Harlow Asa (surgeon) back in June 2020.  Review of Systems    see HPI  Past Medical History:  Diagnosis Date  . Benign microscopic hematuria    neg cystoscopy 11/12- dr. Janice Norrie  . DJD (degenerative joint disease)    L KNEE  . Family history of anesthesia complication    multiple family members with history of this  . GERD (gastroesophageal reflux disease)   . Hurthle cell neoplasm of thyroid   . Hyperlipidemia LDL goal <70 10/23/2019  . Hypertension   . Malignant hyperthermia   . NSTEMI (non-ST elevated myocardial infarction) ([place]) 10/22/2019  . Numbness and tingling in left arm      Social History   Socioeconomic History  . Marital status: Married    Spouse name: Not on file  . Number of children: 3  . Years of education: Not on file  .  Highest education level: Not on file  Occupational History  . Not on file  Tobacco Use  . Smoking status: Former Smoker    Packs/day: 1.00    Years: 39.00    Pack years: 39.00    Types: Cigarettes    Quit date: 11/2019    Years since quitting: 0.4  . Smokeless tobacco: Never Used  Substance and Sexual Activity  . Alcohol use: No    Alcohol/week: 0.0 standard drinks  . Drug use: No  . Sexual activity: Yes    Partners: Male  Other Topics Concern  . Not on file  Social History Narrative   Regular exercise:  No   Caffeine Use:  2 cups coffee and 3-4 glasses of soda daily.   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   Social Determinants of Health   Financial Resource Strain:   . Difficulty of Paying Living Expenses:   Food Insecurity:   . Worried About Charity fundraiser in the Last Year:   . Arboriculturist in the Last Year:   Transportation Needs:   . Film/video editor (Medical):   Marland Kitchen Lack of Transportation (Non-Medical):   Physical Activity:   . Days of Exercise per Week:   . Minutes of  Exercise per Session:   Stress:   . Feeling of Stress :   Social Connections:   . Frequency of Communication with Friends and Family:   . Frequency of Social Gatherings with Friends and Family:   . Attends Religious Services:   . Active Member of Clubs or Organizations:   . Attends Archivist Meetings:   Marland Kitchen Marital Status:   Intimate Partner Violence:   . Fear of Current or Ex-Partner:   . Emotionally Abused:   Marland Kitchen Physically Abused:   . Sexually Abused:     Past Surgical History:  Procedure Laterality Date  . ABDOMINAL HYSTERECTOMY  1996  . BREAST EXCISIONAL BIOPSY Left 01/2016  . BREAST EXCISIONAL BIOPSY Left 03/2016  . CERVICAL DISC SURGERY  2013   Dr. Arnoldo Morale  . CORONARY BALLOON ANGIOPLASTY N/A 10/25/2019   Procedure: CORONARY BALLOON ANGIOPLASTY;  Surgeon: Troy Sine, MD;  Location: [facility];  Service: Cardiovascular;  Laterality: N/A;  . CORONARY STENT INTERVENTION N/A 10/22/2019   Procedure: CORONARY STENT INTERVENTION;  Surgeon: Troy Sine, MD;  Location: [facility];  Service: Cardiovascular;  Laterality: N/A;  mid lad   . CORONARY STENT INTERVENTION N/A 10/25/2019   Procedure: CORONARY STENT INTERVENTION;  Surgeon: Troy Sine, MD;  Location: [facility];  Service: Cardiovascular;  Laterality: N/A;  . DILATION AND CURETTAGE OF UTERUS    . KNEE CARTILAGE SURGERY  1999   right knee  . LEFT HEART CATH AND CORONARY ANGIOGRAPHY N/A 10/22/2019   Procedure: LEFT HEART CATH AND CORONARY ANGIOGRAPHY;  Surgeon: Troy Sine, MD;  Location: [facility];  Service: Cardiovascular;  Laterality: N/A;  . ROTATOR CUFF REPAIR Right 05/25/13  . 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 TOTAL KNEE ARTHROPLASTY Right 01/19/2015   Procedure: RIGHT TOTAL KNEE ARTHROPLASTY;  Surgeon: Johnn Hai, MD;  Location: [facility];  Service: Orthopedics;  Laterality: Right;  . TUBAL LIGATION      Family History  Problem Relation Age of Onset  . Hypertension Mother   . Hyperlipidemia Mother   . Heart disease Father        CAD; stent at age 47  . Diabetes Father   . Hypertension Father   . Hyperlipidemia Father   . Cancer Father        prostate  . Dementia Father   . Cancer Paternal Aunt        BREAST  . Breast cancer Paternal Aunt     Allergies  Allergen Reactions  . Shrimp [Shellfish Allergy] Itching    Rash and lips itching  . Ace Inhibitors Cough    Cough  . Aspirin Nausea And Vomiting  . Azithromycin Rash    Current Outpatient Medications on File Prior to Visit  Medication Sig Dispense Refill  . aspirin 81 MG chewable tablet Chew 1 tablet (81 mg total) by mouth daily. 90 tablet 3  . atorvastatin  (LIPITOR) 80 MG tablet Take 1 tablet (80 mg total) by mouth daily. 90 tablet 1  . carvedilol (COREG) 12.5 MG tablet Take 1 tablet (12.5 mg total) by mouth 2 (two) times daily with a meal. 180 tablet 1  . fluticasone (FLONASE) 50 MCG/ACT nasal spray USE 2 SPRAYS IN EACH NOSTRIL EVERY DAY 48 g 1  . gabapentin (NEURONTIN) 300 MG capsule Take  1 capsule by mouth daily in the morning, 1 at noon and 2 at bedtime. 360 capsule 1  . isosorbide mononitrate (IMDUR) 30 MG 24 hr tablet Take 1 tablet (30 mg total) by mouth daily. 90 tablet 1  . linaclotide (LINZESS) 145 MCG CAPS capsule Take 145 mcg by mouth daily before breakfast.    . methocarbamol (ROBAXIN) 500 MG tablet Take 1 tablet (500 mg total) by mouth 2 (two) times daily as needed.  2  . nitroGLYCERIN (NITROSTAT) 0.4 MG SL tablet Place 1 tablet (0.4 mg total) under the tongue every 5 (five) minutes as needed for chest pain (CP or SOB). 25 tablet 3  . oxyCODONE-acetaminophen (PERCOCET) 7.5-325 MG tablet Take 1 tablet by mouth at bedtime as needed. 30 tablet 0  . pantoprazole (PROTONIX) 40 MG tablet TAKE 1 TABLET (40 MG TOTAL) BY MOUTH 2 (TWO) TIMES DAILY. 60 tablet 2  . potassium chloride SA (KLOR-CON) 20 MEQ tablet Take 20 mEq by mouth daily.    . ticagrelor (BRILINTA) 90 MG TABS tablet Take 1 tablet (90 mg total) by mouth 2 (two) times daily. 180 tablet 1  . VENTOLIN HFA 108 (90 Base) MCG/ACT inhaler INHALE 2 PUFFS INTO THE LUNGS EVERY 6 HOURS AS NEEDED FOR WHEEZING OR SHORTNESS OF BREATH. 18 g 0   No current facility-administered medications on file prior to visit.    BP 136/72 (BP Location: Right Arm, Patient Position: Sitting, Cuff Size: Small)   Pulse 76   Temp 97.6 F (36.4 C) (Temporal)   Resp 18   Ht 5\' 6"  (1.676 m)   Wt 193 lb (87.5 kg)   LMP 11/25/1994   SpO2 100%   BMI 31.15 kg/m    Objective:   Physical Exam Constitutional:      Appearance: She is well-developed.  Cardiovascular:     Rate and Rhythm: Normal rate and regular  rhythm.     Heart sounds: Normal heart sounds. No murmur.  Pulmonary:     Effort: Pulmonary effort is normal. No respiratory distress.     Breath sounds: Normal breath sounds. No wheezing.  Psychiatric:        Behavior: Behavior normal.        Thought Content: Thought content normal.        Judgment: Judgment normal.           Assessment & Plan:  HTN- bp stable on increased dose of losartan.  Hyperglycemia- obtain follow up A1C.    Tobacco abuse- interested in quitting. Would like to try chantix.  Common side effects including rare risk of suicide ideation was discussed with the patient today.  Patient is instructed to go directly to the ED if this occurs.  We discussed that patient can continue to smoke for 1 week after starting chantix, but then must discontinue cigarettes.  He is also instructed to contact us prior to completion of the starter month pack for an rx for the continuation month pack.   5 minutes spent with patient today on tobacco cessation counseling.   Hyperlipidemia- follow up lipid panel reviewed in care everywhere and LDL <100. Continue statin.  CAD- clinically stable. Management per cardiology.  Vaginitis- trial of diflucan. Pt is advised to call if symptoms worsen or if they fail to improve after diflucan.  GERD- stable on PPI. Continue.   Peripheral neuropathy- stable on gabapentin, continue same.   Due for colonoscopy- will order.   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  I did refer her to a general surgeon last year but it is not clear if she went for consult- I will check on this. See phone note 6/1.   This visit occurred during the SARS-CoV-2 public health emergency.  Safety protocols were in place, including screening questions prior to the visit, additional usage of staff PPE, and extensive cleaning of exam room while observing appropriate contact time as indicated for disinfecting solutions.

## 2020-04-25 NOTE — Patient Instructions (Signed)
Please complete lab work prior to leaving.   

## 2020-04-25 NOTE — Telephone Encounter (Signed)
Records release form faxed to [facility]

## 2020-04-26 ENCOUNTER — Encounter: Payer: Self-pay | Admitting: Family

## 2020-05-16 ENCOUNTER — Telehealth: Payer: Self-pay | Admitting: Family

## 2020-05-16 MED ORDER — VARENICLINE TARTRATE 1 MG PO TABS: 1.0000 mg | ORAL_TABLET | Freq: Two times a day (BID) | ORAL | 1 refills | Status: DC

## 2020-05-16 NOTE — Telephone Encounter (Signed)
Refills sent to [facility].

## 2020-05-16 NOTE — Telephone Encounter (Signed)
Patient reports she is doing well and is not smoking at this time.

## 2020-05-16 NOTE — Telephone Encounter (Signed)
Could you please contact pt and ask her how she is doing with Chantix.  Has she quit smoking? If so, I will go ahead and send refills.

## 2020-05-17 DIAGNOSIS — Z96651 Presence of right artificial knee joint: Secondary | ICD-10-CM | POA: Diagnosis not present

## 2020-05-17 DIAGNOSIS — Z471 Aftercare following joint replacement surgery: Secondary | ICD-10-CM | POA: Diagnosis not present

## 2020-05-18 DIAGNOSIS — Z96651 Presence of right artificial knee joint: Secondary | ICD-10-CM | POA: Diagnosis not present

## 2020-05-19 MED FILL — PANTOPRAZOLE SOD DR 40 MG T: 40 | 30 days supply | Qty: 60 | Fill #2

## 2020-05-23 NOTE — Progress Notes (Deleted)
HPI: Follow-up coronary artery disease.  Previously followed by Dr. Harriet Masson and Dr  and Dr. Marijo File.  Patient underwent cardiac catheterization following non-ST elevation myocardial infarction November 2020.  She was found to have 95% PDA, 99% mid LAD.  She had PCI of LAD with drug-eluting stent.  She underwent staged PCI of the distal RCA and bifurcation of the ostial PDA and ostial posterior lateral 3 days later.  Echocardiogram at that time showed ejection fraction 45%, mild mitral and tricuspid regurgitation.  Since last seen  Current Outpatient Medications  Medication Sig Dispense Refill  . aspirin 81 MG chewable tablet Chew 1 tablet (81 mg total) by mouth daily. 90 tablet 3  . atorvastatin (LIPITOR) 80 MG tablet Take 1 tablet (80 mg total) by mouth daily. 90 tablet 1  . carvedilol (COREG) 12.5 MG tablet Take 1 tablet (12.5 mg total) by mouth 2 (two) times daily with a meal. 180 tablet 1  . fluconazole (DIFLUCAN) 150 MG tablet Take 1 tab by mouth today for vaginal itching. You may repeat in 3 days if needed. 2 tablet 0  . fluticasone (FLONASE) 50 MCG/ACT nasal spray USE 2 SPRAYS IN EACH NOSTRIL EVERY DAY 48 g 1  . gabapentin (NEURONTIN) 300 MG capsule Take 1 capsule by mouth daily in the morning, 1 at noon and 2 at bedtime. 360 capsule 1  . isosorbide mononitrate (IMDUR) 30 MG 24 hr tablet Take 1 tablet (30 mg total) by mouth daily. 90 tablet 1  . linaclotide (LINZESS) 145 MCG CAPS capsule Take 145 mcg by mouth daily before breakfast.    . losartan (COZAAR) 50 MG tablet Take 1 tablet (50 mg total) by mouth daily. 90 tablet 3  . methocarbamol (ROBAXIN) 500 MG tablet Take 1 tablet (500 mg total) by mouth 2 (two) times daily as needed.  2  . nitroGLYCERIN (NITROSTAT) 0.4 MG SL tablet Place 1 tablet (0.4 mg total) under the tongue every 5 (five) minutes as needed for chest pain (CP or SOB). 25 tablet 3  . oxyCODONE-acetaminophen (PERCOCET) 7.5-325 MG tablet Take 1 tablet by mouth at bedtime  as needed. 30 tablet 0  . pantoprazole (PROTONIX) 40 MG tablet TAKE 1 TABLET (40 MG TOTAL) BY MOUTH 2 (TWO) TIMES DAILY. 60 tablet 2  . potassium chloride SA (KLOR-CON) 20 MEQ tablet Take 20 mEq by mouth daily.    . ticagrelor (BRILINTA) 90 MG TABS tablet Take 1 tablet (90 mg total) by mouth 2 (two) times daily. 180 tablet 1  . varenicline (CHANTIX CONTINUING MONTH PAK) 1 MG tablet Take 1 tablet (1 mg total) by mouth 2 (two) times daily. 60 tablet 1  . VENTOLIN HFA 108 (90 Base) MCG/ACT inhaler INHALE 2 PUFFS INTO THE LUNGS EVERY 6 HOURS AS NEEDED FOR WHEEZING OR SHORTNESS OF BREATH. 18 g 0   No current facility-administered medications for this visit.     Past Medical History:  Diagnosis Date  . Benign microscopic hematuria    neg cystoscopy 11/12- dr. Janice Norrie  . DJD (degenerative joint disease)    L KNEE  . Family history of anesthesia complication    multiple family members with history of this  . GERD (gastroesophageal reflux disease)   . Hurthle cell neoplasm of thyroid   . Hyperlipidemia LDL goal <70 10/23/2019  . Hypertension   . Malignant hyperthermia   . NSTEMI (non-ST elevated myocardial infarction) ([place]) 10/22/2019  . Numbness and tingling in left arm     Past  Surgical History:  Procedure Laterality Date  . ABDOMINAL HYSTERECTOMY  1996  . BREAST EXCISIONAL BIOPSY Left 01/2016  . BREAST EXCISIONAL BIOPSY Left 03/2016  . CERVICAL DISC SURGERY  2013   Dr. Arnoldo Morale  . CORONARY BALLOON ANGIOPLASTY N/A 10/25/2019   Procedure: CORONARY BALLOON ANGIOPLASTY;  Surgeon: Troy Sine, MD;  Location: [facility];  Service: Cardiovascular;  Laterality: N/A;  . CORONARY STENT INTERVENTION N/A 10/22/2019   Procedure: CORONARY STENT INTERVENTION;  Surgeon: Troy Sine, MD;  Location: [facility];  Service: Cardiovascular;  Laterality: N/A;  mid lad   . CORONARY STENT INTERVENTION N/A 10/25/2019   Procedure: CORONARY STENT INTERVENTION;  Surgeon: Troy Sine,  MD;  Location: [facility];  Service: Cardiovascular;  Laterality: N/A;  . DILATION AND CURETTAGE OF UTERUS    . KNEE CARTILAGE SURGERY  1999   right knee  . LEFT HEART CATH AND CORONARY ANGIOGRAPHY N/A 10/22/2019   Procedure: LEFT HEART CATH AND CORONARY ANGIOGRAPHY;  Surgeon: Troy Sine, MD;  Location: [facility];  Service: Cardiovascular;  Laterality: N/A;  . ROTATOR CUFF REPAIR Right 05/25/13  . 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 TOTAL KNEE ARTHROPLASTY Right 01/19/2015   Procedure: RIGHT TOTAL KNEE ARTHROPLASTY;  Surgeon: Johnn Hai, MD;  Location: [facility];  Service: Orthopedics;  Laterality: Right;  . TUBAL LIGATION      Social History   Socioeconomic History  . Marital status: Married    Spouse name: Not on file  . Number of children: 3  . Years of education: Not on file  . Highest education level: Not on file  Occupational History  . Not on file  Tobacco Use  . Smoking status: Former Smoker    Packs/day: 1.00    Years: 39.00    Pack years: 39.00    Types: Cigarettes    Quit date: 11/2019    Years since quitting: 0.4  . Smokeless tobacco: Never Used  Substance and Sexual Activity  . Alcohol use: No    Alcohol/week: 0.0 standard drinks  . Drug use: No  . Sexual activity: Yes    Partners: Male  Other Topics Concern  . Not on file  Social History Narrative   Regular exercise:  No   Caffeine Use:  2 cups coffee and 3-4 glasses of soda daily.   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   Social Determinants of Health   Financial Resource Strain:   . Difficulty of Paying Living Expenses:   Food Insecurity:   . Worried About Charity fundraiser in the Last Year:   . Arboriculturist in the Last Year:   Transportation Needs:   . Lexicographer (Medical):   Marland Kitchen Lack of Transportation (Non-Medical):   Physical Activity:   . Days of Exercise per Week:   . Minutes of Exercise per Session:   Stress:   . Feeling of Stress :   Social Connections:   . Frequency of Communication with Friends and Family:   . Frequency of Social Gatherings with Friends and Family:   . Attends Religious Services:   . Active Member of Clubs or Organizations:   . Attends Archivist Meetings:   .  Marital Status:   Intimate Partner Violence:   . Fear of Current or Ex-Partner:   . Emotionally Abused:   Marland Kitchen Physically Abused:   . Sexually Abused:     Family History  Problem Relation Age of Onset  . Hypertension Mother   . Hyperlipidemia Mother   . Heart disease Father        CAD; stent at age 26  . Diabetes Father   . Hypertension Father   . Hyperlipidemia Father   . Cancer Father        prostate  . Dementia Father   . Cancer Paternal Aunt        BREAST  . Breast cancer Paternal Aunt     ROS: no fevers or chills, productive cough, hemoptysis, dysphasia, odynophagia, melena, hematochezia, dysuria, hematuria, rash, seizure activity, orthopnea, PND, pedal edema, claudication. Remaining systems are negative.  Physical Exam: Well-developed well-nourished in no acute distress.  Skin is warm and dry.  HEENT is normal.  Neck is supple.  Chest is clear to auscultation with normal expansion.  Cardiovascular exam is regular rate and rhythm.  Abdominal exam nontender or distended. No masses palpated. Extremities show no edema. neuro grossly intact  ECG- personally reviewed  A/P  1 coronary artery disease status post PCI of LAD, RCA and ostium of PDA/posterolateral-patient denies recurrent chest pain.  Plan to continue aspirin and statin.  We will continue Brilinta through November and then discontinue.  2 ischemic cardiomyopathy-continue carvedilol and losartan.  Her initial ejection fraction was in the 30 to 35% range.  We  will plan to repeat echocardiogram to reassess LV function to see if there has been improvement following revascularization and medical therapy.  3 hypertension-patient's blood pressure is controlled.  Continue present medications.  4 hyperlipidemia-continue statin.  Check lipids and liver.  Kirk Ruths, MD

## 2020-05-25 ENCOUNTER — Other Ambulatory Visit (HOSPITAL_COMMUNITY): Payer: Self-pay | Admitting: Specialist

## 2020-05-25 ENCOUNTER — Other Ambulatory Visit: Payer: Self-pay | Admitting: Specialist

## 2020-05-25 DIAGNOSIS — Z96651 Presence of right artificial knee joint: Secondary | ICD-10-CM

## 2020-05-31 ENCOUNTER — Other Ambulatory Visit: Payer: Self-pay | Admitting: Surgery

## 2020-05-31 ENCOUNTER — Ambulatory Visit: Payer: Medicare Other | Admitting: Cardiology

## 2020-05-31 DIAGNOSIS — E041 Nontoxic single thyroid nodule: Secondary | ICD-10-CM

## 2020-06-05 DIAGNOSIS — I081 Rheumatic disorders of both mitral and tricuspid valves: Secondary | ICD-10-CM | POA: Diagnosis not present

## 2020-06-06 ENCOUNTER — Encounter (HOSPITAL_COMMUNITY): Payer: Medicare Other

## 2020-06-09 ENCOUNTER — Encounter (HOSPITAL_COMMUNITY)
Admission: RE | Admit: 2020-06-09 | Discharge: 2020-06-09 | Disposition: A | Discharge status: Home / Self Care | Payer: Medicare Other | Source: Ambulatory Visit | Attending: Specialist | Admitting: Specialist

## 2020-06-09 DIAGNOSIS — M25561 Pain in right knee: Secondary | ICD-10-CM | POA: Diagnosis not present

## 2020-06-09 DIAGNOSIS — Z96651 Presence of right artificial knee joint: Secondary | ICD-10-CM | POA: Diagnosis not present

## 2020-06-09 DIAGNOSIS — M25551 Pain in right hip: Secondary | ICD-10-CM | POA: Diagnosis not present

## 2020-06-09 MED ORDER — TECHNETIUM TC 99M MEDRONATE IV KIT
20.0000 | PACK | Freq: Once | INTRAVENOUS | Status: AC | PRN
  Administered 2020-06-09: 20 via INTRAVENOUS

## 2020-06-26 ENCOUNTER — Other Ambulatory Visit: Payer: Self-pay | Admitting: Family

## 2020-06-26 MED FILL — PANTOPRAZOLE SOD DR 40 MG T: 40 | 30 days supply | Qty: 60 | Fill #0

## 2020-06-26 MED FILL — POTASSIUM CHLORIDE CRYS ER: 20 | 90 days supply | Qty: 180 | Fill #0

## 2020-06-30 ENCOUNTER — Ambulatory Visit: Payer: Medicare Other | Admitting: Medical

## 2020-07-03 ENCOUNTER — Ambulatory Visit: Payer: Medicare Other | Admitting: Family Medicine

## 2020-07-11 DIAGNOSIS — Z79899 Other long term (current) drug therapy: Secondary | ICD-10-CM | POA: Diagnosis not present

## 2020-07-11 DIAGNOSIS — M5416 Radiculopathy, lumbar region: Secondary | ICD-10-CM | POA: Diagnosis not present

## 2020-07-11 DIAGNOSIS — M25561 Pain in right knee: Secondary | ICD-10-CM | POA: Diagnosis not present

## 2020-07-11 DIAGNOSIS — Z5181 Encounter for therapeutic drug level monitoring: Secondary | ICD-10-CM | POA: Diagnosis not present

## 2020-07-12 ENCOUNTER — Other Ambulatory Visit: Payer: Self-pay

## 2020-07-12 ENCOUNTER — Ambulatory Visit (INDEPENDENT_AMBULATORY_CARE_PROVIDER_SITE_OTHER): Payer: Medicare Other | Admitting: Family

## 2020-07-12 VITALS — BP 128/68 | HR 72 | Temp 98.7°F | Resp 16 | Ht 66.0 in | Wt 197.0 lb

## 2020-07-12 DIAGNOSIS — N6002 Solitary cyst of left breast: Secondary | ICD-10-CM

## 2020-07-12 DIAGNOSIS — N611 Abscess of the breast and nipple: Secondary | ICD-10-CM

## 2020-07-12 MED ORDER — AMOXICILLIN-POT CLAVULANATE 875-125 MG PO TABS
1.0000 | ORAL_TABLET | Freq: Two times a day (BID) | ORAL | 0 refills | Status: DC
Start: 2020-07-12 — End: 2020-07-25

## 2020-07-12 MED FILL — AMOX-CLAV 875-125 MG TABLET: 875-125 | 10 days supply | Qty: 20 | Fill #0

## 2020-07-12 NOTE — Progress Notes (Signed)
Subjective:    Patient ID: Jade [name], female    DOB: [birth year], 64 y.o.   MRN: [id number]  HPI  Patient is a 64 yr old female presents today to discuss concern about breast cyst. She reports that 1-2 weeks ago she developed redness and was able to express drainage from the left nipple. She had associated soreness.  Reports improvement at this time. 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 She is also due for mammogram.   Review of Systems See HPI  Past Medical History:  Diagnosis Date  . Benign microscopic hematuria    neg cystoscopy 11/12- dr. Janice Norrie  . DJD (degenerative joint disease)    L KNEE  . Family history of anesthesia complication    multiple family members with history of this  . GERD (gastroesophageal reflux disease)   . Hurthle cell neoplasm of thyroid   . Hyperlipidemia LDL goal <70 10/23/2019  . Hypertension   . Malignant hyperthermia   . NSTEMI (non-ST elevated myocardial infarction) ([place]) 10/22/2019  . Numbness and tingling in left arm      Social History   Socioeconomic History  . Marital status: Married    Spouse name: Not on file  . Number of children: 3  . Years of education: Not on file  . Highest education level: Not on file  Occupational History  . Not on file  Tobacco Use  . Smoking status: Former Smoker    Packs/day: 1.00    Years: 39.00    Pack years: 39.00    Types: Cigarettes    Quit date: 11/2019    Years since quitting: 0.6  . Smokeless tobacco: Never Used  Substance and Sexual Activity  . Alcohol use: No    Alcohol/week: 0.0 standard drinks  . Drug use: No  . Sexual activity: Yes    Partners: Male  Other Topics Concern  . Not on file  Social History Narrative   Regular exercise:  No   Caffeine Use:  2 cups coffee and 3-4 glasses of soda daily.   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   Social  Determinants of Health   Financial Resource Strain:   . Difficulty of Paying Living Expenses: Not on file  Food Insecurity:   . Worried About Charity fundraiser in the Last Year: Not on file  . Ran Out of Food in the Last Year: Not on file  Transportation Needs:   . Lack of Transportation (Medical): Not on file  . Lack of Transportation (Non-Medical): Not on file  Physical Activity:   . Days of Exercise per Week: Not on file  . Minutes of Exercise per Session: Not on file  Stress:   . Feeling of Stress : Not on file  Social Connections:   . Frequency of Communication with Friends and Family: Not on file  . Frequency of Social Gatherings with Friends and Family: Not on file  . Attends Religious Services: Not on file  . Active Member of Clubs or Organizations: Not on file  . Attends Archivist Meetings: Not on file  . Marital Status: Not on file  Intimate Partner Violence:   . Fear of Current or Ex-Partner: Not on file  . Emotionally Abused: Not on file  . Physically Abused: Not on file  . Sexually Abused: Not  on file    Past Surgical History:  Procedure Laterality Date  . ABDOMINAL HYSTERECTOMY  1996  . BREAST EXCISIONAL BIOPSY Left 01/2016  . BREAST EXCISIONAL BIOPSY Left 03/2016  . CERVICAL DISC SURGERY  2013   Dr. Arnoldo Morale  . CORONARY BALLOON ANGIOPLASTY N/A 10/25/2019   Procedure: CORONARY BALLOON ANGIOPLASTY;  Surgeon: Troy Sine, MD;  Location: [facility];  Service: Cardiovascular;  Laterality: N/A;  . CORONARY STENT INTERVENTION N/A 10/22/2019   Procedure: CORONARY STENT INTERVENTION;  Surgeon: Troy Sine, MD;  Location: [facility];  Service: Cardiovascular;  Laterality: N/A;  mid lad   . CORONARY STENT INTERVENTION N/A 10/25/2019   Procedure: CORONARY STENT INTERVENTION;  Surgeon: Troy Sine, MD;  Location: [place];  Service: Cardiovascular;  Laterality: N/A;  . DILATION AND CURETTAGE OF UTERUS    . KNEE CARTILAGE  SURGERY  1999   right knee  . LEFT HEART CATH AND CORONARY ANGIOGRAPHY N/A 10/22/2019   Procedure: LEFT HEART CATH AND CORONARY ANGIOGRAPHY;  Surgeon: Troy Sine, MD;  Location: [facility];  Service: Cardiovascular;  Laterality: N/A;  . ROTATOR CUFF REPAIR Right 05/25/13  . 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 TOTAL KNEE ARTHROPLASTY Right 01/19/2015   Procedure: RIGHT TOTAL KNEE ARTHROPLASTY;  Surgeon: Johnn Hai, MD;  Location: [facility];  Service: Orthopedics;  Laterality: Right;  . TUBAL LIGATION      Family History  Problem Relation Age of Onset  . Hypertension Mother   . Hyperlipidemia Mother   . Heart disease Father        CAD; stent at age 26  . Diabetes Father   . Hypertension Father   . Hyperlipidemia Father   . Cancer Father        prostate  . Dementia Father   . Cancer Paternal Aunt        BREAST  . Breast cancer Paternal Aunt     Allergies  Allergen Reactions  . Shrimp [Shellfish Allergy] Itching    Rash and lips itching  . Ace Inhibitors Cough    Cough  . Aspirin Nausea And Vomiting  . Azithromycin Rash    Current Outpatient Medications on File Prior to Visit  Medication Sig Dispense Refill  . aspirin 81 MG chewable tablet Chew 1 tablet (81 mg total) by mouth daily. 90 tablet 3  . atorvastatin (LIPITOR) 80 MG tablet Take 1 tablet (80 mg total) by mouth daily. 90 tablet 1  . carvedilol (COREG) 12.5 MG tablet Take 1 tablet (12.5 mg total) by mouth 2 (two) times daily with a meal. 180 tablet 1  . fluconazole (DIFLUCAN) 150 MG tablet Take 1 tab by mouth today for vaginal itching. You may repeat in 3 days if needed. 2 tablet 0  . fluticasone (FLONASE) 50 MCG/ACT nasal spray USE 2 SPRAYS IN EACH NOSTRIL EVERY DAY 48 g 1  . gabapentin (NEURONTIN) 300 MG capsule Take 1 capsule by mouth daily in the morning, 1 at  noon and 2 at bedtime. 360 capsule 1  . isosorbide mononitrate (IMDUR) 30 MG 24 hr tablet Take 1 tablet (30 mg total) by mouth daily. 90 tablet 1  . linaclotide (LINZESS) 145 MCG CAPS capsule Take 145 mcg by mouth daily before breakfast.    . losartan (COZAAR) 50 MG tablet Take  1 tablet (50 mg total) by mouth daily. 90 tablet 3  . methocarbamol (ROBAXIN) 500 MG tablet Take 1 tablet (500 mg total) by mouth 2 (two) times daily as needed.  2  . nitroGLYCERIN (NITROSTAT) 0.4 MG SL tablet Place 1 tablet (0.4 mg total) under the tongue every 5 (five) minutes as needed for chest pain (CP or SOB). 25 tablet 3  . oxyCODONE-acetaminophen (PERCOCET) 7.5-325 MG tablet Take 1 tablet by mouth at bedtime as needed. 30 tablet 0  . pantoprazole (PROTONIX) 40 MG tablet TAKE 1 TABLET (40 MG TOTAL) BY MOUTH 2 (TWO) TIMES DAILY. 60 tablet 2  . potassium chloride SA (KLOR-CON) 20 MEQ tablet TAKE 1 TABLET (20 MEQ TOTAL) 2 (TWO) TIMES DAILY 180 tablet 1  . ticagrelor (BRILINTA) 90 MG TABS tablet Take 1 tablet (90 mg total) by mouth 2 (two) times daily. 180 tablet 1  . varenicline (CHANTIX CONTINUING MONTH PAK) 1 MG tablet Take 1 tablet (1 mg total) by mouth 2 (two) times daily. 60 tablet 1  . VENTOLIN HFA 108 (90 Base) MCG/ACT inhaler INHALE 2 PUFFS INTO THE LUNGS EVERY 6 HOURS AS NEEDED FOR WHEEZING OR SHORTNESS OF BREATH. 18 g 0   No current facility-administered medications on file prior to visit.    BP 128/68 (BP Location: Right Arm, Patient Position: Sitting, Cuff Size: Small)   Pulse 72   Temp 98.7 F (37.1 C) (Oral)   Resp 16   Ht 5\' 6"  (1.676 m)   Wt 197 lb (89.4 kg)   LMP 11/25/1994   SpO2 100%   BMI 31.80 kg/m       Objective:   Physical Exam Constitutional:      Appearance: Normal appearance.  Skin:    General: Skin is warm and dry.  Neurological:     Mental Status: She is alert and oriented to person, place, and time.   Breast:  R breast exam normal, no palpable masses. L breast exam, has  some thickening beneath the left areola with overlying scar.  No erythema        Assessment & Plan:  Recurrent breast abscess-  Will rx with augmentin, refer for diagnostic mammogram and [place], and have pt follow back up in 2 weeks.  This visit occurred during the SARS-CoV-2 public health emergency.  Safety protocols were in place, including screening questions prior to the visit, additional usage of staff PPE, and extensive cleaning of exam room while observing appropriate contact time as indicated for disinfecting solutions.

## 2020-07-12 NOTE — Patient Instructions (Signed)
Please begin augmentin twice daily.  [facility] should be [facility]. Call if you develop increased pain, swelling, redness.

## 2020-07-13 ENCOUNTER — Encounter: Payer: Self-pay | Admitting: Family

## 2020-07-17 ENCOUNTER — Other Ambulatory Visit: Payer: Self-pay | Admitting: Family

## 2020-07-17 DIAGNOSIS — N6002 Solitary cyst of left breast: Secondary | ICD-10-CM

## 2020-07-19 ENCOUNTER — Encounter: Payer: Self-pay | Admitting: Family

## 2020-07-25 ENCOUNTER — Other Ambulatory Visit: Payer: Self-pay

## 2020-07-25 ENCOUNTER — Encounter: Payer: Self-pay | Admitting: Family

## 2020-07-25 ENCOUNTER — Ambulatory Visit (INDEPENDENT_AMBULATORY_CARE_PROVIDER_SITE_OTHER): Payer: Medicare Other | Admitting: Family

## 2020-07-25 VITALS — BP 124/69 | HR 71 | Temp 98.3°F | Resp 16 | Ht 66.0 in | Wt 195.0 lb

## 2020-07-25 DIAGNOSIS — R300 Dysuria: Secondary | ICD-10-CM | POA: Diagnosis not present

## 2020-07-25 LAB — POC URINALSYSI DIPSTICK (AUTOMATED)
Bilirubin, UA: NEGATIVE
Blood, UA: NEGATIVE
Glucose, UA: NEGATIVE
Ketones, UA: NEGATIVE
Leukocytes, UA: NEGATIVE
Nitrite, UA: NEGATIVE
Protein, UA: NEGATIVE
Spec Grav, UA: 1.025 (ref 1.010–1.025)
Urobilinogen, UA: NEGATIVE E.U./dL — AB
pH, UA: 5 (ref 5.0–8.0)

## 2020-07-25 MED ORDER — CEPHALEXIN 500 MG PO CAPS: 500.0000 mg | ORAL_CAPSULE | Freq: Three times a day (TID) | ORAL | 0 refills | Status: AC

## 2020-07-25 MED FILL — CEPHALEXIN 500 MG CAPSULE: 500 | 5 days supply | Qty: 15 | Fill #0

## 2020-07-25 NOTE — Patient Instructions (Signed)
Please begin keflex (antibiotic) for urine infection. Call if symptoms worsen or if not improved in 3 days.

## 2020-07-25 NOTE — Progress Notes (Signed)
Subjective:    Patient ID: Jade [name], female    DOB: [birth year], 64 y.o.   MRN: [id number]  HPI  Patient is a 64 yr old pt to presents today with c/o suprapubic pain and dysuria. Symptoms started 1 week ago. Denies fever or hematuria.  Reports frequency.    Review of Systems See HPI  Past Medical History:  Diagnosis Date  . Benign microscopic hematuria    neg cystoscopy 11/12- dr. Janice Norrie  . DJD (degenerative joint disease)    L KNEE  . Family history of anesthesia complication    multiple family members with history of this  . GERD (gastroesophageal reflux disease)   . Hurthle cell neoplasm of thyroid   . Hyperlipidemia LDL goal <70 10/23/2019  . Hypertension   . Malignant hyperthermia   . NSTEMI (non-ST elevated myocardial infarction) ([place]) 10/22/2019  . Numbness and tingling in left arm      Social History   Socioeconomic History  . Marital status: Married    Spouse name: Not on file  . Number of children: 3  . Years of education: Not on file  . Highest education level: Not on file  Occupational History  . Not on file  Tobacco Use  . Smoking status: Former Smoker    Packs/day: 1.00    Years: 39.00    Pack years: 39.00    Types: Cigarettes    Quit date: 11/2019    Years since quitting: 0.6  . Smokeless tobacco: Never Used  Substance and Sexual Activity  . Alcohol use: No    Alcohol/week: 0.0 standard drinks  . Drug use: No  . Sexual activity: Yes    Partners: Male  Other Topics Concern  . Not on file  Social History Narrative   Regular exercise:  No   Caffeine Use:  2 cups coffee and 3-4 glasses of soda daily.   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   Social Determinants of Health   Financial Resource Strain:   . Difficulty of Paying Living Expenses: Not on file  Food Insecurity:   . Worried About Charity fundraiser in the Last Year: Not on file  . Ran Out of Food in the Last  Year: Not on file  Transportation Needs:   . Lack of Transportation (Medical): Not on file  . Lack of Transportation (Non-Medical): Not on file  Physical Activity:   . Days of Exercise per Week: Not on file  . Minutes of Exercise per Session: Not on file  Stress:   . Feeling of Stress : Not on file  Social Connections:   . Frequency of Communication with Friends and Family: Not on file  . Frequency of Social Gatherings with Friends and Family: Not on file  . Attends Religious Services: Not on file  . Active Member of Clubs or Organizations: Not on file  . Attends Archivist Meetings: Not on file  . Marital Status: Not on file  Intimate Partner Violence:   . Fear of Current or Ex-Partner: Not on file  . Emotionally Abused: Not on file  . Physically Abused: Not on file  . Sexually Abused: Not on file    Past Surgical History:  Procedure Laterality Date  . ABDOMINAL HYSTERECTOMY  1996  . BREAST EXCISIONAL BIOPSY Left 01/2016  . BREAST EXCISIONAL BIOPSY Left 03/2016  . [place] SURGERY  2013  Dr. Arnoldo Morale  . CORONARY BALLOON ANGIOPLASTY N/A 10/25/2019   Procedure: CORONARY BALLOON ANGIOPLASTY;  Surgeon: Troy Sine, MD;  Location: [facility];  Service: Cardiovascular;  Laterality: N/A;  . CORONARY STENT INTERVENTION N/A 10/22/2019   Procedure: CORONARY STENT INTERVENTION;  Surgeon: Troy Sine, MD;  Location: [place];  Service: Cardiovascular;  Laterality: N/A;  mid lad   . CORONARY STENT INTERVENTION N/A 10/25/2019   Procedure: CORONARY STENT INTERVENTION;  Surgeon: Troy Sine, MD;  Location: [facility];  Service: Cardiovascular;  Laterality: N/A;  . DILATION AND CURETTAGE OF UTERUS    . KNEE CARTILAGE SURGERY  1999   right knee  . LEFT HEART CATH AND CORONARY ANGIOGRAPHY N/A 10/22/2019   Procedure: LEFT HEART CATH AND CORONARY ANGIOGRAPHY;  Surgeon: Troy Sine, MD;  Location: [facility];  Service:  Cardiovascular;  Laterality: N/A;  . ROTATOR CUFF REPAIR Right 05/25/13  . 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 TOTAL KNEE ARTHROPLASTY Right 01/19/2015   Procedure: RIGHT TOTAL KNEE ARTHROPLASTY;  Surgeon: Johnn Hai, MD;  Location: [facility];  Service: Orthopedics;  Laterality: Right;  . TUBAL LIGATION      Family History  Problem Relation Age of Onset  . Hypertension Mother   . Hyperlipidemia Mother   . Heart disease Father        CAD; stent at age 35  . Diabetes Father   . Hypertension Father   . Hyperlipidemia Father   . Cancer Father        prostate  . Dementia Father   . Cancer Paternal Aunt        BREAST  . Breast cancer Paternal Aunt     Allergies  Allergen Reactions  . Shrimp [Shellfish Allergy] Itching    Rash and lips itching  . Ace Inhibitors Cough    Cough  . Aspirin Nausea And Vomiting  . Azithromycin Rash    Current Outpatient Medications on File Prior to Visit  Medication Sig Dispense Refill  . aspirin 81 MG chewable tablet Chew 1 tablet (81 mg total) by mouth daily. 90 tablet 3  . atorvastatin (LIPITOR) 80 MG tablet Take 1 tablet (80 mg total) by mouth daily. 90 tablet 1  . carvedilol (COREG) 12.5 MG tablet Take 1 tablet (12.5 mg total) by mouth 2 (two) times daily with a meal. 180 tablet 1  . fluticasone (FLONASE) 50 MCG/ACT nasal spray USE 2 SPRAYS IN EACH NOSTRIL EVERY DAY 48 g 1  . gabapentin (NEURONTIN) 300 MG capsule Take 1 capsule by mouth daily in the morning, 1 at noon and 2 at bedtime. 360 capsule 1  . isosorbide mononitrate (IMDUR) 30 MG 24 hr tablet Take 1 tablet (30 mg total) by mouth daily. 90 tablet 1  . linaclotide (LINZESS) 145 MCG CAPS capsule Take 145 mcg by mouth daily before breakfast.    . losartan (COZAAR) 50 MG tablet Take 1 tablet (50 mg total) by mouth daily. 90 tablet 3  . methocarbamol  (ROBAXIN) 500 MG tablet Take 1 tablet (500 mg total) by mouth 2 (two) times daily as needed.  2  . nitroGLYCERIN (NITROSTAT) 0.4 MG SL tablet Place 1 tablet (0.4 mg total) under the tongue every 5 (five) minutes as needed for chest pain (CP or SOB). 25 tablet 3  .  oxyCODONE-acetaminophen (PERCOCET) 7.5-325 MG tablet Take 1 tablet by mouth at bedtime as needed. 30 tablet 0  . pantoprazole (PROTONIX) 40 MG tablet TAKE 1 TABLET (40 MG TOTAL) BY MOUTH 2 (TWO) TIMES DAILY. 60 tablet 2  . potassium chloride SA (KLOR-CON) 20 MEQ tablet TAKE 1 TABLET (20 MEQ TOTAL) 2 (TWO) TIMES DAILY 180 tablet 1  . ticagrelor (BRILINTA) 90 MG TABS tablet Take 1 tablet (90 mg total) by mouth 2 (two) times daily. 180 tablet 1  . VENTOLIN HFA 108 (90 Base) MCG/ACT inhaler INHALE 2 PUFFS INTO THE LUNGS EVERY 6 HOURS AS NEEDED FOR WHEEZING OR SHORTNESS OF BREATH. 18 g 0   No current facility-administered medications on file prior to visit.    BP 124/69 (BP Location: Right Arm, Patient Position: Sitting, Cuff Size: Small)   Pulse 71   Temp 98.3 F (36.8 C) (Oral)   Resp 16   Ht 5\' 6"  (1.676 m)   Wt 195 lb (88.5 kg)   LMP 11/25/1994   SpO2 99%   BMI 31.47 kg/m       Objective:   Physical Exam Constitutional:      Appearance: She is well-developed.  Neck:     Thyroid: No thyromegaly.  Cardiovascular:     Rate and Rhythm: Normal rate and regular rhythm.     Heart sounds: Normal heart sounds. No murmur heard.   Pulmonary:     Effort: Pulmonary effort is normal. No respiratory distress.     Breath sounds: Normal breath sounds. No wheezing.  Abdominal:     General: Bowel sounds are normal.     Tenderness: There is abdominal tenderness in the suprapubic area. There is no right CVA tenderness, left CVA tenderness or guarding.  Musculoskeletal:     Cervical back: Neck supple.  Skin:    General: Skin is warm and dry.  Neurological:     Mental Status: She is alert and oriented to person, place, and time.   Psychiatric:        Behavior: Behavior normal.        Thought Content: Thought content normal.        Judgment: Judgment normal.           Assessment & Plan:  Dysuria- UA unremarkable but hx and exam most consistent with UTI. Will send urine for culture and begin empiric keflex. Pt is advised to call if symptoms worsen or if not improved in 3 days.   This visit occurred during the SARS-CoV-2 public health emergency.  Safety protocols were in place, including screening questions prior to the visit, additional usage of staff PPE, and extensive cleaning of exam room while observing appropriate contact time as indicated for disinfecting solutions.

## 2020-07-26 LAB — URINE CULTURE
MICRO NUMBER:: 10893554
SPECIMEN QUALITY:: ADEQUATE

## 2020-08-21 ENCOUNTER — Other Ambulatory Visit: Payer: Self-pay

## 2020-08-21 ENCOUNTER — Ambulatory Visit (INDEPENDENT_AMBULATORY_CARE_PROVIDER_SITE_OTHER): Payer: Medicare Other | Admitting: Family Medicine

## 2020-08-21 ENCOUNTER — Encounter: Payer: Self-pay | Admitting: Family Medicine

## 2020-08-21 VITALS — BP 126/82 | HR 75 | Temp 98.1°F | Resp 18 | Ht 66.0 in | Wt 197.6 lb

## 2020-08-21 DIAGNOSIS — J0110 Acute frontal sinusitis, unspecified: Secondary | ICD-10-CM | POA: Diagnosis not present

## 2020-08-21 DIAGNOSIS — J019 Acute sinusitis, unspecified: Secondary | ICD-10-CM | POA: Insufficient documentation

## 2020-08-21 DIAGNOSIS — N760 Acute vaginitis: Secondary | ICD-10-CM | POA: Diagnosis not present

## 2020-08-21 MED ORDER — AMOXICILLIN-POT CLAVULANATE 875-125 MG PO TABS: 1.0000 | ORAL_TABLET | Freq: Two times a day (BID) | ORAL | 0 refills | Status: DC

## 2020-08-21 MED ORDER — FLUCONAZOLE 150 MG PO TABS: ORAL_TABLET | ORAL | 0 refills | Status: DC

## 2020-08-21 MED FILL — AMOX-CLAV 875-125 MG TABLET: 875-125 | 10 days supply | Qty: 20 | Fill #0

## 2020-08-21 MED FILL — FLUCONAZOLE 150 MG TABS: 150 | 2 days supply | Qty: 2 | Fill #0

## 2020-08-21 NOTE — Progress Notes (Signed)
Patient ID: Jade [name], female    DOB: 1956-05-01  Age: 64 y.o. MRN: [id number]    Subjective:  Subjective  HPI Jade [name] presents for L sided sinus pressure / congestion and L ear pain.  No fever.  Pt is using flonase and taking zyrtec.  Symptoms x 1 week.    Review of Systems  Constitutional: Negative for appetite change, diaphoresis, fatigue and unexpected weight change.  HENT: Positive for congestion, ear pain, sinus pressure and sinus pain. Negative for hearing loss and sneezing.   Eyes: Negative for pain, redness and visual disturbance.  Respiratory: Negative for cough, chest tightness, shortness of breath and wheezing.   Cardiovascular: Negative for chest pain, palpitations and leg swelling.  Endocrine: Negative for cold intolerance, heat intolerance, polydipsia, polyphagia and polyuria.  Genitourinary: Negative for difficulty urinating, dysuria and frequency.  Neurological: Negative for dizziness, light-headedness, numbness and headaches.    History Past Medical History:  Diagnosis Date  . Benign microscopic hematuria    neg cystoscopy 11/12- dr. Janice Norrie  . DJD (degenerative joint disease)    L KNEE  . Family history of anesthesia complication    multiple family members with history of this  . GERD (gastroesophageal reflux disease)   . Hurthle cell neoplasm of thyroid   . Hyperlipidemia LDL goal <70 10/23/2019  . Hypertension   . Malignant hyperthermia   . NSTEMI (non-ST elevated myocardial infarction) ([place]) 10/22/2019  . Numbness and tingling in left arm     She has a past surgical history that includes Knee cartilage surgery (1999); Abdominal hysterectomy (1996); Cervical disc surgery (2013); Rotator cuff repair (Right, 05/25/13); Tubal ligation; Total knee arthroplasty (Left, 01/20/2014); Dilation and curettage of uterus; Total knee arthroplasty (Right, 01/19/2015); Breast excisional  biopsy (Left, 01/2016); Breast excisional biopsy (Left, 03/2016); LEFT HEART CATH AND CORONARY ANGIOGRAPHY (N/A, 10/22/2019); CORONARY STENT INTERVENTION (N/A, 10/22/2019); CORONARY STENT INTERVENTION (N/A, 10/25/2019); and CORONARY BALLOON ANGIOPLASTY (N/A, 10/25/2019).   Her family history includes Breast cancer in her paternal aunt; Cancer in her father and paternal aunt; Dementia in her father; Diabetes in her father; Heart disease in her father; Hyperlipidemia in her father and mother; Hypertension in her father and mother.She reports that she quit smoking about 8 months ago. Her smoking use included cigarettes. She has a 39.00 pack-year smoking history. She has never used smokeless tobacco. She reports that she does not drink alcohol and does not use drugs.  Current Outpatient Medications on File Prior to Visit  Medication Sig Dispense Refill  . aspirin 81 MG chewable tablet Chew 1 tablet (81 mg total) by mouth daily. 90 tablet 3  . atorvastatin (LIPITOR) 80 MG tablet Take 1 tablet (80 mg total) by mouth daily. 90 tablet 1  . carvedilol (COREG) 12.5 MG tablet Take 1 tablet (12.5 mg total) by mouth 2 (two) times daily with a meal. 180 tablet 1  . fluticasone (FLONASE) 50 MCG/ACT nasal spray USE 2 SPRAYS IN EACH NOSTRIL EVERY DAY 48 g 1  . gabapentin (NEURONTIN) 300 MG capsule Take 1 capsule by mouth daily in the morning, 1 at noon and 2 at bedtime. 360 capsule 1  . isosorbide mononitrate (IMDUR) 30 MG 24 hr tablet Take 1 tablet (30 mg total) by mouth daily. 90 tablet 1  .  linaclotide (LINZESS) 145 MCG CAPS capsule Take 145 mcg by mouth daily before breakfast.    . losartan (COZAAR) 50 MG tablet Take 1 tablet (50 mg total) by mouth daily. 90 tablet 3  . methocarbamol (ROBAXIN) 500 MG tablet Take 1 tablet (500 mg total) by mouth 2 (two) times daily as needed.  2  . nitroGLYCERIN (NITROSTAT) 0.4 MG SL tablet Place 1 tablet (0.4 mg total) under the tongue every 5 (five) minutes as needed for chest  pain (CP or SOB). 25 tablet 3  . oxyCODONE-acetaminophen (PERCOCET) 7.5-325 MG tablet Take 1 tablet by mouth at bedtime as needed. 30 tablet 0  . pantoprazole (PROTONIX) 40 MG tablet TAKE 1 TABLET (40 MG TOTAL) BY MOUTH 2 (TWO) TIMES DAILY. 60 tablet 2  . potassium chloride SA (KLOR-CON) 20 MEQ tablet TAKE 1 TABLET (20 MEQ TOTAL) 2 (TWO) TIMES DAILY 180 tablet 1  . ticagrelor (BRILINTA) 90 MG TABS tablet Take 1 tablet (90 mg total) by mouth 2 (two) times daily. 180 tablet 1  . VENTOLIN HFA 108 (90 Base) MCG/ACT inhaler INHALE 2 PUFFS INTO THE LUNGS EVERY 6 HOURS AS NEEDED FOR WHEEZING OR SHORTNESS OF BREATH. 18 g 0   No current facility-administered medications on file prior to visit.     Objective:  Objective  Physical Exam Vitals reviewed.  Constitutional:      Appearance: She is diaphoretic.  HENT:     Right Ear: Tympanic membrane and external ear normal. There is no impacted cerumen.     Left Ear: Tympanic membrane and external ear normal. There is no impacted cerumen.     Nose: Mucosal edema and rhinorrhea present. No nasal deformity.     Right Sinus: No maxillary sinus tenderness or frontal sinus tenderness.     Left Sinus: Maxillary sinus tenderness and frontal sinus tenderness present.     Mouth/Throat:     Pharynx: No oropharyngeal exudate.  Cardiovascular:     Rate and Rhythm: Normal rate and regular rhythm.     Heart sounds: Normal heart sounds. No murmur heard.   Pulmonary:     Effort: Pulmonary effort is normal. No respiratory distress.     Breath sounds: Normal breath sounds. No rales.  Musculoskeletal:     Cervical back: Normal range of motion and neck supple.  Lymphadenopathy:     Cervical: No cervical adenopathy.  Skin:    General: Skin is warm.  Neurological:     Mental Status: She is alert and oriented to person, place, and time.    BP 126/82 (BP Location: Right Arm, Patient Position: Sitting, Cuff Size: Large)   Pulse 75   Temp 98.1 F (36.7 C) (Oral)    Resp 18   Ht 5\' 6"  (1.676 m)   Wt 197 lb 9.6 oz (89.6 kg)   LMP 11/25/1994   SpO2 98%   BMI 31.89 kg/m  Wt Readings from Last 3 Encounters:  08/21/20 197 lb 9.6 oz (89.6 kg)  07/25/20 195 lb (88.5 kg)  07/12/20 197 lb (89.4 kg)     Lab Results  Component Value Date   WBC 8.4 10/26/2019   HGB 11.6 (L) 10/26/2019   HCT 36.6 10/26/2019   PLT 343 10/26/2019   GLUCOSE 88 04/25/2020   CHOL 204 (H) 10/23/2019   TRIG 177 (H) 10/23/2019   HDL 27 (L) 10/23/2019   LDLDIRECT 175.0 08/10/2018   LDLCALC 142 (H) 10/23/2019   ALT 20 10/21/2019   AST 36 10/21/2019   NA  139 04/25/2020   K 3.9 04/25/2020   CL 106 04/25/2020   CREATININE 0.78 04/25/2020   BUN 14 04/25/2020   CO2 25 04/25/2020   TSH 1.17 04/25/2020   INR 1.00 01/13/2015   HGBA1C 6.4 04/25/2020    NM Bone Scan 3 Phase  Result Date: 06/09/2020 CLINICAL DATA:  Right hip pain radiating to right knee, right knee replacement 2016,, previous left knee surgery 2015 EXAM: NUCLEAR MEDICINE 3-PHASE BONE SCAN TECHNIQUE: Radionuclide angiographic images, immediate static blood pool images, and 3-hour delayed static images were obtained of the bilateral knees after intravenous injection of radiopharmaceutical. RADIOPHARMACEUTICALS:  21.6 mCi Tc-79m MDP IV COMPARISON:  01/13/2015 FINDINGS: Vascular phase: Symmetrical radiotracer activity within the bilateral knees on vascular phase of the exam. Blood pool phase: Photopenia related to bilateral knee arthroplasties. No abnormal radiotracer accumulation. Delayed phase: Photopenia related to bilateral knee arthroplasties. Low level activity surrounding the 3 component bilateral knee arthroplasties compatible with postsurgical change. IMPRESSION: 1. Unremarkable three-phase bone scan. No evidence of prosthesis loosening or infection within either knee. Electronically Signed   By: Randa Ngo M.D.   On: 06/09/2020 21:07     Assessment & Plan:  Plan  I am having Adriana Simas start on  amoxicillin-clavulanate and fluconazole. I am also having her maintain her fluticasone, oxyCODONE-acetaminophen, methocarbamol, linaclotide, aspirin, atorvastatin, carvedilol, isosorbide mononitrate, nitroGLYCERIN, ticagrelor, Ventolin HFA, gabapentin, losartan, potassium chloride SA, and pantoprazole.  Meds ordered this encounter  Medications  . amoxicillin-clavulanate (AUGMENTIN) 875-125 MG tablet    Sig: Take 1 tablet by mouth 2 (two) times daily.    Dispense:  20 tablet    Refill:  0  . fluconazole (DIFLUCAN) 150 MG tablet    Sig: 1 po x1, may repeat in 3 days prn    Dispense:  2 tablet    Refill:  0    Problem List Items Addressed This Visit      Unprioritized   Acute non-recurrent frontal sinusitis - Primary    Antibiotic per order con't flonase and antihistamine  Pt will get a covid test  rto prn       Relevant Medications   amoxicillin-clavulanate (AUGMENTIN) 875-125 MG tablet   fluconazole (DIFLUCAN) 150 MG tablet    Other Visit Diagnoses    Acute vaginitis       Relevant Medications   fluconazole (DIFLUCAN) 150 MG tablet      Follow-up: Return if symptoms worsen or fail to improve.  Ann Held, DO

## 2020-08-21 NOTE — Patient Instructions (Signed)
Sinusitis, Adult Sinusitis is inflammation of your sinuses. Sinuses are hollow spaces in the bones around your face. Your sinuses are located:  Around your eyes.  In the middle of your forehead.  Behind your nose.  In your cheekbones. Mucus normally drains out of your sinuses. When your nasal tissues become inflamed or swollen, mucus can become trapped or blocked. This allows bacteria, viruses, and fungi to grow, which leads to infection. Most infections of the sinuses are caused by a virus. Sinusitis can develop quickly. It can last for up to 4 weeks (acute) or for more than 12 weeks (chronic). Sinusitis often develops after a cold. What are the causes? This condition is caused by anything that creates swelling in the sinuses or stops mucus from draining. This includes:  Allergies.  Asthma.  Infection from bacteria or viruses.  Deformities or blockages in your nose or sinuses.  Abnormal growths in the nose (nasal polyps).  Pollutants, such as chemicals or irritants in the air.  Infection from fungi (rare). What increases the risk? You are more likely to develop this condition if you:  Have a weak body defense system (immune system).  Do a lot of swimming or diving.  Overuse nasal sprays.  Smoke. What are the signs or symptoms? The main symptoms of this condition are pain and a feeling of pressure around the affected sinuses. Other symptoms include:  Stuffy nose or congestion.  Thick drainage from your nose.  Swelling and warmth over the affected sinuses.  Headache.  Upper toothache.  A cough that may get worse at night.  Extra mucus that collects in the throat or the back of the nose (postnasal drip).  Decreased sense of smell and taste.  Fatigue.  A fever.  Sore throat.  Bad breath. How is this diagnosed? This condition is diagnosed based on:  Your symptoms.  Your medical history.  A physical exam.  Tests to find out if your condition is  acute or chronic. This may include: ? Checking your nose for nasal polyps. ? Viewing your sinuses using a device that has a light (endoscope). ? Testing for allergies or bacteria. ? Imaging tests, such as an MRI or CT scan. In rare cases, a bone biopsy may be done to rule out more serious types of fungal sinus disease. How is this treated? Treatment for sinusitis depends on the cause and whether your condition is chronic or acute.  If caused by a virus, your symptoms should go away on their own within 10 days. You may be given medicines to relieve symptoms. They include: ? Medicines that shrink swollen nasal passages (topical intranasal decongestants). ? Medicines that treat allergies (antihistamines). ? A spray that eases inflammation of the nostrils (topical intranasal corticosteroids). ? Rinses that help get rid of thick mucus in your nose (nasal saline washes).  If caused by bacteria, your health care provider may recommend waiting to see if your symptoms improve. Most bacterial infections will get better without antibiotic medicine. You may be given antibiotics if you have: ? A severe infection. ? A weak immune system.  If caused by narrow nasal passages or nasal polyps, you may need to have surgery. Follow these instructions at home: Medicines  Take, use, or apply over-the-counter and prescription medicines only as told by your health care provider. These may include nasal sprays.  If you were prescribed an antibiotic medicine, take it as told by your health care provider. Do not stop taking the antibiotic even if you start   to feel better. Hydrate and humidify   Drink enough fluid to keep your urine pale yellow. Staying hydrated will help to thin your mucus.  Use a cool mist humidifier to keep the humidity level in your home above 50%.  Inhale steam for 10-15 minutes, 3-4 times a day, or as told by your health care provider. You can do this in the bathroom while a hot shower is  running.  Limit your exposure to cool or dry air. Rest  Rest as much as possible.  Sleep with your head raised (elevated).  Make sure you get enough sleep each night. General instructions   Apply a warm, moist washcloth to your face 3-4 times a day or as told by your health care provider. This will help with discomfort.  Wash your hands often with soap and water to reduce your exposure to germs. If soap and water are not available, use hand sanitizer.  Do not smoke. Avoid being around people who are smoking (secondhand smoke).  Keep all follow-up visits as told by your health care provider. This is important. Contact a health care provider if:  You have a fever.  Your symptoms get worse.  Your symptoms do not improve within 10 days. Get help right away if:  You have a severe headache.  You have persistent vomiting.  You have severe pain or swelling around your face or eyes.  You have vision problems.  You develop confusion.  Your neck is stiff.  You have trouble breathing. Summary  Sinusitis is soreness and inflammation of your sinuses. Sinuses are hollow spaces in the bones around your face.  This condition is caused by nasal tissues that become inflamed or swollen. The swelling traps or blocks the flow of mucus. This allows bacteria, viruses, and fungi to grow, which leads to infection.  If you were prescribed an antibiotic medicine, take it as told by your health care provider. Do not stop taking the antibiotic even if you start to feel better.  Keep all follow-up visits as told by your health care provider. This is important. This information is not intended to replace advice given to you by your health care provider. Make sure you discuss any questions you have with your health care provider. Document Revised: 04/13/2018 Document Reviewed: 04/13/2018 [employer] Patient Education  2020 [employer]  

## 2020-08-21 NOTE — Assessment & Plan Note (Signed)
Antibiotic per order con't flonase and antihistamine  Pt will get a covid test  rto prn

## 2020-08-22 MED FILL — PANTOPRAZOLE SOD DR 40 MG T: 40 | 30 days supply | Qty: 60 | Fill #1

## 2020-08-23 ENCOUNTER — Other Ambulatory Visit: Payer: Self-pay

## 2020-08-23 ENCOUNTER — Emergency Department (HOSPITAL_BASED_OUTPATIENT_CLINIC_OR_DEPARTMENT_OTHER)
Admission: EM | Admit: 2020-08-23 | Discharge: 2020-08-23 | Disposition: A | Discharge status: Home / Self Care | Payer: Medicare Other | Attending: Emergency Medicine | Admitting: Emergency Medicine

## 2020-08-23 ENCOUNTER — Telehealth: Payer: Self-pay | Admitting: Family

## 2020-08-23 ENCOUNTER — Encounter (HOSPITAL_BASED_OUTPATIENT_CLINIC_OR_DEPARTMENT_OTHER): Payer: Self-pay | Admitting: *Deleted

## 2020-08-23 DIAGNOSIS — Z96652 Presence of left artificial knee joint: Secondary | ICD-10-CM | POA: Insufficient documentation

## 2020-08-23 DIAGNOSIS — Z87891 Personal history of nicotine dependence: Secondary | ICD-10-CM | POA: Diagnosis not present

## 2020-08-23 DIAGNOSIS — R040 Epistaxis: Secondary | ICD-10-CM | POA: Diagnosis not present

## 2020-08-23 DIAGNOSIS — Z79899 Other long term (current) drug therapy: Secondary | ICD-10-CM | POA: Diagnosis not present

## 2020-08-23 DIAGNOSIS — Z7982 Long term (current) use of aspirin: Secondary | ICD-10-CM | POA: Diagnosis not present

## 2020-08-23 DIAGNOSIS — I2510 Atherosclerotic heart disease of native coronary artery without angina pectoris: Secondary | ICD-10-CM | POA: Insufficient documentation

## 2020-08-23 DIAGNOSIS — I10 Essential (primary) hypertension: Secondary | ICD-10-CM | POA: Diagnosis not present

## 2020-08-23 MED ORDER — OXYMETAZOLINE HCL 0.05 % NA SOLN
NASAL | Status: AC
  Administered 2020-08-23: 1 via NASAL
  Filled 2020-08-23: qty 30

## 2020-08-23 MED ORDER — OXYMETAZOLINE HCL 0.05 % NA SOLN: 1.0000 | Freq: Once | NASAL | Status: AC

## 2020-08-23 NOTE — ED Provider Notes (Addendum)
[facility] EMERGENCY DEPARTMENT Provider Note   CSN: [id number] Arrival date & time: 08/23/20  1654     History Chief Complaint  Patient presents with  . Epistaxis    Jade [name] is a 64 y.o. female history of hypertension, GERD, MI, on Brilinta.  Patient presents today for right-sided nosebleed onset 11 AM this morning, reports an intermittent small amount of blood dripping from her right nostril she has been attempting to stop with direct pressure without improvement.  Denies any associated pain.  She reports she had a small amount of bleeding several days ago when she was diagnosed with sinusitis.  She reports she is being treated with Augmentin with improvement.  Her initial complaints had been left-sided sinus pressure which has completely resolved.  She denies any headache, vision changes, lightheadedness, chest pain/shortness of breath, fatigue or any additional concerns.  HPI     Past Medical History:  Diagnosis Date  . Benign microscopic hematuria    neg cystoscopy 11/12- dr. Janice Norrie  . DJD (degenerative joint disease)    L KNEE  . Family history of anesthesia complication    multiple family members with history of this  . GERD (gastroesophageal reflux disease)   . Hurthle cell neoplasm of thyroid   . Hyperlipidemia LDL goal <70 10/23/2019  . Hypertension   . Malignant hyperthermia   . NSTEMI (non-ST elevated myocardial infarction) ([place]) 10/22/2019  . Numbness and tingling in left arm     Patient Active Problem List   Diagnosis Date Noted  . Acute non-recurrent frontal sinusitis 08/21/2020  . CAD in native artery s/p DES LAD, DES RCA 10/26/19 10/26/2019  . Ischemic cardiomyopathy 10/26/2019  . Weakness 10/26/2019  . Hyperlipidemia LDL goal <70 10/23/2019  . ACS (acute coronary syndrome) ([place]) 10/22/2019  . NSTEMI (non-ST elevated myocardial infarction) ([place]) 10/22/2019  . Hurthle cell neoplasm of thyroid   . Multinodular goiter 01/26/2019  .  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 IBS (irritable bowel syndrome) 08/29/2014  . Pelvic pain in female 07/04/2014  . DJD (degenerative joint disease) of knee 01/20/2014  . Back pain 12/27/2013  . Dyslipidemia 12/27/2013  . Hot flashes 12/27/2013  . Daytime somnolence 07/28/2013  . Borderline diabetes 03/26/2013  . Knee pain, bilateral 03/26/2013  . Cervical disc disease 03/26/2013  . Allergic rhinitis 03/04/2012  . Hypertension 10/11/2011  . General medical examination 07/16/2011  . Tobacco abuse 07/16/2011  . Joint pain 07/16/2011  . Abnormal EKG 07/16/2011  . Microscopic hematuria 07/16/2011  . GERD (gastroesophageal reflux disease) 06/24/2011    Past Surgical History:  Procedure Laterality Date  . ABDOMINAL HYSTERECTOMY  1996  . BREAST EXCISIONAL BIOPSY Left 01/2016  . BREAST EXCISIONAL BIOPSY Left 03/2016  . CERVICAL DISC SURGERY  2013   Dr. Arnoldo Morale  . CORONARY BALLOON ANGIOPLASTY N/A 10/25/2019   Procedure: CORONARY BALLOON ANGIOPLASTY;  Surgeon: Troy Sine, MD;  Location: [facility];  Service: Cardiovascular;  Laterality: N/A;  . CORONARY STENT INTERVENTION N/A 10/22/2019   Procedure: CORONARY STENT INTERVENTION;  Surgeon: Troy Sine, MD;  Location: [facility];  Service: Cardiovascular;  Laterality: N/A;  mid lad   . CORONARY STENT INTERVENTION N/A 10/25/2019   Procedure: CORONARY STENT INTERVENTION;  Surgeon: Troy Sine, MD;  Location: [facility];  Service: Cardiovascular;  Laterality: N/A;  . DILATION AND CURETTAGE OF UTERUS    . KNEE CARTILAGE SURGERY  1999   right knee  . LEFT HEART  CATH AND CORONARY ANGIOGRAPHY N/A 10/22/2019   Procedure: LEFT HEART CATH AND CORONARY ANGIOGRAPHY;  Surgeon: Troy Sine, MD;  Location: [facility];  Service: Cardiovascular;  Laterality: N/A;  . ROTATOR CUFF REPAIR Right 05/25/13  . TOTAL KNEE ARTHROPLASTY Left 01/20/2014   Procedure: LEFT TOTAL KNEE ARTHROPLASTY;  Surgeon: Johnn Hai, MD;   Location: [facility];  Service: Orthopedics;  Laterality: Left;  . TOTAL KNEE ARTHROPLASTY Right 01/19/2015   Procedure: RIGHT TOTAL KNEE ARTHROPLASTY;  Surgeon: Johnn Hai, MD;  Location: [facility];  Service: Orthopedics;  Laterality: Right;  . TUBAL LIGATION       OB History   No obstetric history on file.     Family History  Problem Relation Age of Onset  . Hypertension Mother   . Hyperlipidemia Mother   . Heart disease Father        CAD; stent at age 51  . Diabetes Father   . Hypertension Father   . Hyperlipidemia Father   . Cancer Father        prostate  . Dementia Father   . Cancer Paternal Aunt        BREAST  . Breast cancer Paternal Aunt     Social History   Tobacco Use  . Smoking status: Former Smoker    Packs/day: 1.00    Years: 39.00    Pack years: 39.00    Types: Cigarettes    Quit date: 11/2019    Years since quitting: 0.7  . Smokeless tobacco: Never Used  Substance Use Topics  . Alcohol use: No    Alcohol/week: 0.0 standard drinks  . Drug use: No    Home Medications Prior to Admission medications   Medication Sig Start Date End Date Taking? Authorizing Provider  amoxicillin-clavulanate (AUGMENTIN) 875-125 MG tablet Take 1 tablet by mouth 2 (two) times daily. 08/21/20   Ann Held, DO  aspirin 81 MG chewable tablet Chew 1 tablet (81 mg total) by mouth daily. 10/27/19   Furth, Cadence H, PA-C  atorvastatin (LIPITOR) 80 MG tablet Take 1 tablet (80 mg total) by mouth daily. 11/15/19   Tobb, Kardie, DO  carvedilol (COREG) 12.5 MG tablet Take 1 tablet (12.5 mg total) by mouth 2 (two) times daily with a meal. 11/15/19   Tobb, Kardie, DO  fluconazole (DIFLUCAN) 150 MG tablet 1 po x1, may repeat in 3 days prn 08/21/20   Carollee Herter, Alferd Apa, DO  fluticasone (FLONASE) 50 MCG/ACT nasal spray USE 2 SPRAYS IN EACH NOSTRIL EVERY DAY 01/06/18   Debbrah Alar, NP  gabapentin (NEURONTIN) 300 MG capsule Take 1 capsule by mouth daily in the morning, 1 at  noon and 2 at bedtime. 12/27/19   Debbrah Alar, NP  isosorbide mononitrate (IMDUR) 30 MG 24 hr tablet Take 1 tablet (30 mg total) by mouth daily. 11/15/19   Tobb, Kardie, DO  linaclotide (LINZESS) 145 MCG CAPS capsule Take 145 mcg by mouth daily before breakfast.    [provider]  losartan (COZAAR) 50 MG tablet Take 1 tablet (50 mg total) by mouth daily. 04/25/20   Debbrah Alar, NP  methocarbamol (ROBAXIN) 500 MG tablet Take 1 tablet (500 mg total) by mouth 2 (two) times daily as needed. 10/13/19   Debbrah Alar, NP  nitroGLYCERIN (NITROSTAT) 0.4 MG SL tablet Place 1 tablet (0.4 mg total) under the tongue every 5 (five) minutes as needed for chest pain (CP or SOB). 11/15/19   Berniece Salines, DO  oxyCODONE-acetaminophen (PERCOCET) 7.5-325 MG tablet Take 1 tablet by mouth at bedtime as needed. 10/13/19   Debbrah Alar, NP  pantoprazole (PROTONIX) 40 MG tablet TAKE 1 TABLET (40 MG TOTAL) BY MOUTH 2 (TWO) TIMES DAILY. 06/26/20   Debbrah Alar, NP  potassium chloride SA (KLOR-CON) 20 MEQ tablet TAKE 1 TABLET (20 MEQ TOTAL) 2 (TWO) TIMES DAILY 06/26/20   Debbrah Alar, NP  ticagrelor (BRILINTA) 90 MG TABS tablet Take 1 tablet (90 mg total) by mouth 2 (two) times daily. 11/15/19   Tobb, Kardie, DO  VENTOLIN HFA 108 (90 Base) MCG/ACT inhaler INHALE 2 PUFFS INTO THE LUNGS EVERY 6 HOURS AS NEEDED FOR WHEEZING OR SHORTNESS OF BREATH. 12/23/19   Debbrah Alar, NP    Allergies    Shrimp [shellfish allergy], Ace inhibitors, Aspirin, and Azithromycin  Review of Systems   Review of Systems  Constitutional: Negative.  Negative for chills, fatigue and fever.  HENT: Positive for nosebleeds. Negative for facial swelling and sore throat.   Respiratory: Negative.  Negative for cough and shortness of breath.   Cardiovascular: Negative.  Negative for chest pain.  Gastrointestinal: Negative.  Negative for abdominal pain.  Neurological: Negative.  Negative for syncope,  weakness and light-headedness.    Physical Exam Updated Vital Signs BP (!) 143/50 (BP Location: Right Arm)   Pulse 65   Temp 98.5 F (36.9 C) (Oral)   Resp 18   Ht 5\' 6"  (1.676 m)   Wt 86.2 kg   LMP 11/25/1994   SpO2 100%   BMI 30.67 kg/m   Physical Exam Constitutional:      General: She is not in acute distress.    Appearance: Normal appearance. She is well-developed. She is not ill-appearing or diaphoretic.  HENT:     Head: Normocephalic and atraumatic.     Jaw: There is normal jaw occlusion.     Right Ear: Tympanic membrane and external ear normal.     Left Ear: Tympanic membrane and external ear normal.     Nose: No nasal deformity or signs of injury.     Right Nostril: Epistaxis present.     Left Nostril: No epistaxis.     Right Sinus: No maxillary sinus tenderness or frontal sinus tenderness.     Left Sinus: No maxillary sinus tenderness or frontal sinus tenderness.     Mouth/Throat:     Mouth: Mucous membranes are moist.     Pharynx: Oropharynx is clear.  Eyes:     General: Vision grossly intact. Gaze aligned appropriately.     Extraocular Movements: Extraocular movements intact.     Conjunctiva/sclera: Conjunctivae normal.     Pupils: Pupils are equal, round, and reactive to light.  Neck:     Trachea: Trachea and phonation normal. No tracheal tenderness or tracheal deviation.  Pulmonary:     Effort: Pulmonary effort is normal. No respiratory distress.  Abdominal:     General: There is no distension.     Palpations: Abdomen is soft.     Tenderness: There is no abdominal tenderness. There is no guarding or rebound.  Musculoskeletal:        General: Normal range of motion.     Cervical back: Normal range of motion and neck supple.  Skin:    General: Skin is warm and dry.  Neurological:     Mental Status: She is alert.     GCS: GCS eye subscore is 4. GCS verbal subscore is 5. GCS motor subscore is 6.  Comments: Speech is clear and goal oriented, follows  commands Major Cranial nerves without deficit, no facial droop Moves extremities without ataxia, coordination intact  Psychiatric:        Behavior: Behavior normal.     ED Results / Procedures / Treatments   Labs (all labs ordered are listed, but only abnormal results are displayed) Labs Reviewed - No data to display  EKG None  Radiology No results found.  Procedures Procedures (including critical care time)  Medications Ordered in ED Medications  oxymetazoline (AFRIN) 0.05 % nasal spray 1 spray (1 spray Each Nare Given 08/23/20 1714)    ED Course  I have reviewed the triage vital signs and the nursing notes.  Pertinent labs & imaging results that were available during my care of the patient were reviewed by me and considered in my medical decision making (see chart for details).  Clinical Course as of Aug 24 2223  Wed Aug 23, 2020  2135 No active bleeding   [BM]    Clinical Course User Index [BM] Gari Crown   MDM Rules/Calculators/A&P                         Additional history obtained from: 1. Nursing notes from this visit. 2. Review of electronic medical record.  Patient seen by PCP on 08/21/2020, diagnosed with acute nonrecurrent frontal sinusitis as well as acute vaginitis.  She is being treated with Augmentin and fluconazole. ---- Six 46-year-old female on Brilinta presented for right-sided nosebleed today.  Recently diagnosed with sinusitis being treated with Augmentin with improvement per patient.  She was given Afrin in triage.  On my initial evaluation she had dried blood present in the right naris without active bleeding.  She was then reassessed around 45 minutes later and still no recurrence of bleeding.  No symptoms suggestive of anemia, no other concerns today.  No indication for blood work or intervention at this time.  Patient be discharged with Afrin and ENT follow-up.  At this time there does not appear to be any evidence of an acute  emergency medical condition and the patient appears stable for discharge with appropriate outpatient follow up. Diagnosis was discussed with patient who verbalizes understanding of care plan and is agreeable to discharge. I have discussed return precautions with patient who verbalizes understanding. Patient encouraged to follow-up with their PCP and ENT. All questions answered.  Patient's case discussed with Dr. Darl Householder who agrees with plan to discharge with follow-up.   Note: Portions of this report may have been transcribed using voice recognition software. Every effort was made to ensure accuracy; however, inadvertent computerized transcription errors may still be present. Final Clinical Impression(s) / ED Diagnoses Final diagnoses:  Right-sided epistaxis    Rx / DC Orders ED Discharge Orders    None       Gari Crown 08/23/20 2221    Deliah Boston, PA-C 08/23/20 2224    Drenda Freeze, MD 08/23/20 [phone]

## 2020-08-23 NOTE — Discharge Instructions (Addendum)
At this time there does not appear to be the presence of an emergent medical condition, however there is always the potential for conditions to change. Please read and follow the below instructions.  Please return to the Emergency Department immediately for any new or worsening symptoms. Please be sure to follow up with your Primary Care Provider within one week regarding your visit today; please call their office to schedule an appointment even if you are feeling better for a follow-up visit. You may continue using the Afrin spray as directed to help with nosebleeds.  Call your primary care doctor today to schedule follow-up appointment for reevaluation.  You may also follow-up with the ear nose and throat specialist Dr. Janace Hoard in her discharge report.  Go to the nearest Emergency Department immediately if: You have fever or chills You have a nosebleed after you fall or hurt your head. Your nosebleed does not go away after 20 minutes. You feel dizzy or weak. You have unusual bleeding from other parts of your body. You have unusual bruising on other parts of your body. You get sweaty. You throw up blood. You have any new/concerning or worsening of symptoms   Please read the additional information packets attached to your discharge summary.  Do not take your medicine if  develop an itchy rash, swelling in your mouth or lips, or difficulty breathing; call 911 and seek immediate emergency medical attention if this occurs.  You may review your lab tests and imaging results in their entirety on your MyChart account.  Please discuss all results of fully with your primary care provider and other specialist at your follow-up visit.  Note: Portions of this text may have been transcribed using voice recognition software. Every effort was made to ensure accuracy; however, inadvertent computerized transcription errors may still be present.

## 2020-08-23 NOTE — ED Triage Notes (Signed)
C/o nose bleed x 3 days on and off

## 2020-08-23 NOTE — Telephone Encounter (Signed)
CallerAbaigeal [name]  Call Back # [phone]  Patient is requesting a note to stay out of work until 08/25/2020 unit her covid results come back . Fax letter to [phone].    Please advise

## 2020-08-24 NOTE — Telephone Encounter (Signed)
Per patient, her results are back and they are negative. She has an appointment tomorrow with Debbrah Alar for follow up

## 2020-08-25 ENCOUNTER — Other Ambulatory Visit: Payer: Self-pay

## 2020-08-25 ENCOUNTER — Other Ambulatory Visit: Payer: Self-pay | Admitting: Family

## 2020-08-25 ENCOUNTER — Ambulatory Visit: Payer: Medicare Other | Admitting: Family

## 2020-08-25 ENCOUNTER — Ambulatory Visit (INDEPENDENT_AMBULATORY_CARE_PROVIDER_SITE_OTHER): Payer: Medicare Other | Admitting: Family

## 2020-08-25 ENCOUNTER — Encounter: Payer: Self-pay | Admitting: Family

## 2020-08-25 VITALS — BP 117/64 | HR 70 | Temp 98.0°F | Resp 16 | Ht 66.0 in | Wt 197.0 lb

## 2020-08-25 DIAGNOSIS — I10 Essential (primary) hypertension: Secondary | ICD-10-CM

## 2020-08-25 DIAGNOSIS — Z23 Encounter for immunization: Secondary | ICD-10-CM | POA: Diagnosis not present

## 2020-08-25 DIAGNOSIS — R040 Epistaxis: Secondary | ICD-10-CM | POA: Diagnosis not present

## 2020-08-25 MED ORDER — SHINGRIX 50 MCG/0.5ML IM SUSR: INTRAMUSCULAR | 1 refills | Status: DC

## 2020-08-25 MED ORDER — GABAPENTIN 300 MG PO CAPS: ORAL_CAPSULE | ORAL | 1 refills | Status: DC

## 2020-08-25 NOTE — Telephone Encounter (Signed)
Rx sent 

## 2020-08-25 NOTE — Telephone Encounter (Signed)
Address of [facility] is [address] [place], pt wants it send to this pharmacy since [facility] is closed on weekends. Please advise.

## 2020-08-25 NOTE — Progress Notes (Signed)
Subjective:    Patient ID: Jade [name], female    DOB: [birth year], 64 y.o.   MRN: [id number]  HPI  Patient is a 64 yr old female who presents today for follow up.  HTN-the patient is maintained on carvedilol 12.5 mg twice daily, losartan 50 mg p.o. daily BP Readings from Last 3 Encounters:  08/25/20 117/64  08/23/20 (!) 143/50  08/21/20 126/82   Epistaxis- had nose bleed 08/23/20 and was evaluated in the ED. ED record is reviewed.  She was discharged on afrin and advised to follow up with ENT.  Reports some bleeding on Monday and again this AM for about 15-20 minutes.   Had visit on 08/21/20 and was treated with augmentin for sinusitis. She reports some improvement in her sinus congestin.  She was also tested for COVID-19 and her test result was negative.  She shows me her outside results today.  Review of Systems See HPI  Past Medical History:  Diagnosis Date  . Benign microscopic hematuria    neg cystoscopy 11/12- dr. Janice Norrie  . DJD (degenerative joint disease)    L KNEE  . Family history of anesthesia complication    multiple family members with history of this  . GERD (gastroesophageal reflux disease)   . Hurthle cell neoplasm of thyroid   . Hyperlipidemia LDL goal <70 10/23/2019  . Hypertension   . Malignant hyperthermia   . NSTEMI (non-ST elevated myocardial infarction) ([place]) 10/22/2019  . Numbness and tingling in left arm      Social History   Socioeconomic History  . Marital status: Married    Spouse name: Not on file  . Number of children: 3  . Years of education: Not on file  . Highest education level: Not on file  Occupational History  . Not on file  Tobacco Use  . Smoking status: Former Smoker    Packs/day: 1.00    Years: 39.00    Pack years: 39.00    Types: Cigarettes    Quit date: 11/2019    Years since quitting: 0.7  . Smokeless tobacco: Never Used  Substance and Sexual Activity  . Alcohol use: No    Alcohol/week: 0.0 standard drinks  .  Drug use: No  . Sexual activity: Yes    Partners: Male  Other Topics Concern  . Not on file  Social History Narrative   Regular exercise:  No   Caffeine Use:  2 cups coffee and 3-4 glasses of soda daily.   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   Social Determinants of Health   Financial Resource Strain:   . Difficulty of Paying Living Expenses: Not on file  Food Insecurity:   . Worried About Charity fundraiser in the Last Year: Not on file  . Ran Out of Food in the Last Year: Not on file  Transportation Needs:   . Lack of Transportation (Medical): Not on file  . Lack of Transportation (Non-Medical): Not on file  Physical Activity:   . Days of Exercise per Week: Not on file  . Minutes of Exercise per Session: Not on file  Stress:   . Feeling of Stress : Not on file  Social Connections:   . Frequency of Communication with Friends and Family: Not on file  . Frequency of Social Gatherings with Friends and Family: Not on file  . Attends Religious Services: Not on file  .  Active Member of Clubs or Organizations: Not on file  . Attends Archivist Meetings: Not on file  . Marital Status: Not on file  Intimate Partner Violence:   . Fear of Current or Ex-Partner: Not on file  . Emotionally Abused: Not on file  . Physically Abused: Not on file  . Sexually Abused: Not on file    Past Surgical History:  Procedure Laterality Date  . ABDOMINAL HYSTERECTOMY  1996  . BREAST EXCISIONAL BIOPSY Left 01/2016  . BREAST EXCISIONAL BIOPSY Left 03/2016  . CERVICAL DISC SURGERY  2013   Dr. Arnoldo Morale  . CORONARY BALLOON ANGIOPLASTY N/A 10/25/2019   Procedure: CORONARY BALLOON ANGIOPLASTY;  Surgeon: Troy Sine, MD;  Location: [facility];  Service: Cardiovascular;  Laterality: N/A;  . CORONARY STENT INTERVENTION N/A 10/22/2019   Procedure: CORONARY STENT INTERVENTION;  Surgeon: Troy Sine, MD;  Location: [facility];  Service: Cardiovascular;  Laterality: N/A;  mid lad   . CORONARY STENT INTERVENTION N/A 10/25/2019   Procedure: CORONARY STENT INTERVENTION;  Surgeon: Troy Sine, MD;  Location: [facility];  Service: Cardiovascular;  Laterality: N/A;  . DILATION AND CURETTAGE OF UTERUS    . KNEE CARTILAGE SURGERY  1999   right knee  . LEFT HEART CATH AND CORONARY ANGIOGRAPHY N/A 10/22/2019   Procedure: LEFT HEART CATH AND CORONARY ANGIOGRAPHY;  Surgeon: Troy Sine, MD;  Location: [place] [facility];  Service: Cardiovascular;  Laterality: N/A;  . ROTATOR CUFF REPAIR Right 05/25/13  . 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 TOTAL KNEE ARTHROPLASTY Right 01/19/2015   Procedure: RIGHT TOTAL KNEE ARTHROPLASTY;  Surgeon: Johnn Hai, MD;  Location: [facility];  Service: Orthopedics;  Laterality: Right;  . TUBAL LIGATION      Family History  Problem Relation Age of Onset  . Hypertension Mother   . Hyperlipidemia Mother   . Heart disease Father        CAD; stent at age 57  . Diabetes Father   . Hypertension Father   . Hyperlipidemia Father   . Cancer Father        prostate  . Dementia Father   . Cancer Paternal Aunt        BREAST  . Breast cancer Paternal Aunt     Allergies  Allergen Reactions  . Shrimp [Shellfish Allergy] Itching    Rash and lips itching  . Ace Inhibitors Cough    Cough  . Aspirin Nausea And Vomiting  . Azithromycin Rash    Current Outpatient Medications on File Prior to Visit  Medication Sig Dispense Refill  . amoxicillin-clavulanate (AUGMENTIN) 875-125 MG tablet Take 1 tablet by mouth 2 (two) times daily. 20 tablet 0  . aspirin 81 MG chewable tablet Chew 1 tablet (81 mg total) by mouth daily. 90 tablet 3  . atorvastatin (LIPITOR) 80 MG tablet Take 1 tablet (80 mg total) by mouth daily. 90 tablet 1  .  carvedilol (COREG) 12.5 MG tablet Take 1 tablet (12.5 mg total) by mouth 2 (two) times daily with a meal. 180 tablet 1  . fluconazole (DIFLUCAN) 150 MG tablet 1 po x1, may repeat in 3 days prn 2 tablet 0  . fluticasone (FLONASE) 50 MCG/ACT nasal spray USE 2 SPRAYS IN EACH NOSTRIL EVERY DAY 48 g 1  . gabapentin (  NEURONTIN) 300 MG capsule Take 1 capsule by mouth daily in the morning, 1 at noon and 2 at bedtime. 360 capsule 1  . isosorbide mononitrate (IMDUR) 30 MG 24 hr tablet Take 1 tablet (30 mg total) by mouth daily. 90 tablet 1  . linaclotide (LINZESS) 145 MCG CAPS capsule Take 145 mcg by mouth daily before breakfast.    . losartan (COZAAR) 50 MG tablet Take 1 tablet (50 mg total) by mouth daily. 90 tablet 3  . methocarbamol (ROBAXIN) 500 MG tablet Take 1 tablet (500 mg total) by mouth 2 (two) times daily as needed.  2  . nitroGLYCERIN (NITROSTAT) 0.4 MG SL tablet Place 1 tablet (0.4 mg total) under the tongue every 5 (five) minutes as needed for chest pain (CP or SOB). 25 tablet 3  . oxyCODONE-acetaminophen (PERCOCET) 7.5-325 MG tablet Take 1 tablet by mouth at bedtime as needed. 30 tablet 0  . pantoprazole (PROTONIX) 40 MG tablet TAKE 1 TABLET (40 MG TOTAL) BY MOUTH 2 (TWO) TIMES DAILY. 60 tablet 2  . potassium chloride SA (KLOR-CON) 20 MEQ tablet TAKE 1 TABLET (20 MEQ TOTAL) 2 (TWO) TIMES DAILY 180 tablet 1  . ticagrelor (BRILINTA) 90 MG TABS tablet Take 1 tablet (90 mg total) by mouth 2 (two) times daily. 180 tablet 1  . VENTOLIN HFA 108 (90 Base) MCG/ACT inhaler INHALE 2 PUFFS INTO THE LUNGS EVERY 6 HOURS AS NEEDED FOR WHEEZING OR SHORTNESS OF BREATH. 18 g 0   No current facility-administered medications on file prior to visit.    BP 117/64 (BP Location: Right Arm, Patient Position: Sitting, Cuff Size: Small)   Pulse 70   Temp 98 F (36.7 C) (Temporal)   Resp 16   Ht 5\' 6"  (1.676 m)   Wt 197 lb (89.4 kg)   LMP 11/25/1994   SpO2 100%   BMI 31.80 kg/m       Objective:   Physical  Exam Constitutional:      Appearance: She is well-developed.  HENT:     Nose:     Right Turbinates: Not enlarged, swollen or pale.     Left Turbinates: Not enlarged, swollen or pale.     Comments: Small clot of blood noted in right nare Neck:     Thyroid: No thyromegaly.  Cardiovascular:     Rate and Rhythm: Normal rate and regular rhythm.     Heart sounds: Normal heart sounds. No murmur heard.   Pulmonary:     Effort: Pulmonary effort is normal. No respiratory distress.     Breath sounds: Normal breath sounds. No wheezing.  Musculoskeletal:     Cervical back: Neck supple.  Skin:    General: Skin is warm and dry.  Neurological:     Mental Status: She is alert and oriented to person, place, and time.  Psychiatric:        Behavior: Behavior normal.        Thought Content: Thought content normal.        Judgment: Judgment normal.           Assessment & Plan:  Epistaxis-she states that she has not been arranged to see ENT.  She tells me that her cardiologist will likely be discontinuing her Brilinta in November.  She feels like her recent sinusitis and nose blowing has exacerbated the nosebleeds.  She wishes to hold off on ENT referral at this time as she hopes that her nosebleeds will resolve when she is off of the [place].  She understands that should she develop a nosebleed that lasts longer than 15 to 20 minutes she is to go back to the ED.  In the meantime, she will let me know if she changes her mind about ENT referral.  Hypertension-blood pressure is at goal.  Continue carvedilol 12.5 mg twice daily, losartan 50 mg p.o. daily.  This visit occurred during the SARS-CoV-2 public health emergency.  Safety protocols were in place, including screening questions prior to the visit, additional usage of staff PPE, and extensive cleaning of exam room while observing appropriate contact time as indicated for disinfecting solutions.

## 2020-08-25 NOTE — Telephone Encounter (Signed)
Pt requesting needing refill on gabapentin (NEURONTIN) 300 MG capsule sent to [facility] [address] [place]. Please advise.

## 2020-09-14 MED FILL — POTASSIUM CHLORIDE CRYS ER: 20 | 90 days supply | Qty: 180 | Fill #1

## 2020-09-14 MED FILL — PANTOPRAZOLE SOD DR 40 MG T: 40 | 30 days supply | Qty: 60 | Fill #2

## 2020-09-22 ENCOUNTER — Ambulatory Visit: Payer: Medicare Other | Attending: Internal Medicine

## 2020-09-22 ENCOUNTER — Other Ambulatory Visit (HOSPITAL_BASED_OUTPATIENT_CLINIC_OR_DEPARTMENT_OTHER): Payer: Self-pay | Admitting: Internal Medicine

## 2020-09-22 DIAGNOSIS — Z23 Encounter for immunization: Secondary | ICD-10-CM

## 2020-09-25 NOTE — Progress Notes (Signed)
   Covid-19 Vaccination Clinic  Name:  Jade [name]    MRN: [id number] DOB: [birth year]  09/25/2020  Jade [name] was observed post Covid-19 immunization for 15 minutes without incident. She was provided with Vaccine Information Sheet and instruction to access the V-Safe system.   Jade [name] was instructed to call 911 with any severe reactions post vaccine: Marland Kitchen Difficulty breathing  . Swelling of face and throat  . A fast heartbeat  . A bad rash all over body  . Dizziness and weakness

## 2020-09-29 MED FILL — PFIZER-BIONTECH COVID-19 VA: 30 | 1 days supply | Qty: 0 | Fill #0

## 2020-10-10 DIAGNOSIS — G894 Chronic pain syndrome: Secondary | ICD-10-CM | POA: Diagnosis not present

## 2020-10-10 DIAGNOSIS — M62830 Muscle spasm of back: Secondary | ICD-10-CM | POA: Diagnosis not present

## 2020-10-17 ENCOUNTER — Telehealth: Payer: Self-pay | Admitting: Family

## 2020-10-17 ENCOUNTER — Other Ambulatory Visit: Payer: Self-pay

## 2020-10-17 MED ORDER — PANTOPRAZOLE SODIUM 40 MG PO TBEC: DELAYED_RELEASE_TABLET | ORAL | 2 refills | Status: DC

## 2020-10-17 NOTE — Telephone Encounter (Signed)
Medication:pantoprazole (PROTONIX) 40 MG tablet [[id number]]   linaclotide (LINZESS) 145 MCG CAPS capsule [[id number]]       Has the patient contacted their pharmacy?  (If no, request that the patient contact the pharmacy for the refill. (If yes, when and what did the pharmacy advise?)     Preferred Pharmacy (with phone number or street name):  [id number] [place], [place] [address] Phone:  [phone]  Fax:  [phone]         Agent: Please be advised that RX refills may take up to 3 business days. We ask that you follow-up with your pharmacy.

## 2020-10-23 ENCOUNTER — Other Ambulatory Visit: Payer: Self-pay | Admitting: Family

## 2020-10-23 NOTE — Telephone Encounter (Signed)
Medication: linaclotide (LINZESS) 145 MCG CAPS capsule    Has the patient contacted their pharmacy? No. (If no, request that the patient contact the pharmacy for the refill.) (If yes, when and what did the pharmacy advise?)  Preferred Pharmacy (with phone number or street name):  [id number] [place], [place] - [address]. AT [address] Phone:  [phone]  Fax:  [phone]       Agent: Please be advised that RX refills may take up to 3 business days. We ask that you follow-up with your pharmacy.

## 2020-10-24 MED ORDER — LINACLOTIDE 145 MCG PO CAPS: 145.0000 ug | ORAL_CAPSULE | Freq: Every day | ORAL | 5 refills | Status: DC

## 2020-10-24 NOTE — Telephone Encounter (Signed)
Yes please

## 2020-10-24 NOTE — Telephone Encounter (Signed)
Patient notified that rx has been sent in. 

## 2020-11-28 DIAGNOSIS — R079 Chest pain, unspecified: Secondary | ICD-10-CM | POA: Diagnosis not present

## 2020-11-28 DIAGNOSIS — I252 Old myocardial infarction: Secondary | ICD-10-CM | POA: Diagnosis not present

## 2020-11-28 DIAGNOSIS — I10 Essential (primary) hypertension: Secondary | ICD-10-CM | POA: Diagnosis not present

## 2020-11-28 DIAGNOSIS — R0600 Dyspnea, unspecified: Secondary | ICD-10-CM | POA: Diagnosis not present

## 2020-11-28 DIAGNOSIS — I2510 Atherosclerotic heart disease of native coronary artery without angina pectoris: Secondary | ICD-10-CM | POA: Diagnosis not present

## 2020-11-28 DIAGNOSIS — E785 Hyperlipidemia, unspecified: Secondary | ICD-10-CM | POA: Diagnosis not present

## 2020-12-14 DIAGNOSIS — R079 Chest pain, unspecified: Secondary | ICD-10-CM | POA: Diagnosis not present

## 2021-01-05 ENCOUNTER — Telehealth: Payer: Self-pay | Admitting: Family

## 2021-01-05 MED ORDER — POTASSIUM CHLORIDE CRYS ER 20 MEQ PO TBCR: EXTENDED_RELEASE_TABLET | ORAL | 0 refills | Status: DC

## 2021-01-05 NOTE — Telephone Encounter (Signed)
Medication: potassium chloride SA (KLOR-CON) 20 MEQ tablet [[id number]]    Has the patient contacted their pharmacy? No. (If no, request that the patient contact the pharmacy for the refill.) (If yes, when and what did the pharmacy advise?)  Preferred Pharmacy (with phone number or street name):  [id number] [place], [place] [address]. AT [address] Phone:  [phone]  Fax:  [phone]       Agent: Please be advised that RX refills may take up to 3 business days. We ask that you follow-up with your pharmacy.

## 2021-01-05 NOTE — Addendum Note (Signed)
Addended by: Tora Kindred on: 01/05/2021 02:44 PM   Modules accepted: Orders

## 2021-01-12 ENCOUNTER — Other Ambulatory Visit: Payer: Self-pay | Admitting: Family

## 2021-01-15 DIAGNOSIS — G894 Chronic pain syndrome: Secondary | ICD-10-CM | POA: Diagnosis not present

## 2021-02-13 ENCOUNTER — Other Ambulatory Visit: Payer: Self-pay | Admitting: Family

## 2021-03-19 ENCOUNTER — Other Ambulatory Visit: Payer: Self-pay

## 2021-03-19 ENCOUNTER — Ambulatory Visit (INDEPENDENT_AMBULATORY_CARE_PROVIDER_SITE_OTHER): Payer: Medicare Other | Admitting: Family

## 2021-03-19 ENCOUNTER — Other Ambulatory Visit (HOSPITAL_BASED_OUTPATIENT_CLINIC_OR_DEPARTMENT_OTHER): Payer: Self-pay

## 2021-03-19 VITALS — BP 128/72 | HR 61 | Temp 98.6°F | Resp 16 | Ht 66.0 in | Wt 204.0 lb

## 2021-03-19 DIAGNOSIS — E785 Hyperlipidemia, unspecified: Secondary | ICD-10-CM

## 2021-03-19 DIAGNOSIS — I2510 Atherosclerotic heart disease of native coronary artery without angina pectoris: Secondary | ICD-10-CM

## 2021-03-19 DIAGNOSIS — I10 Essential (primary) hypertension: Secondary | ICD-10-CM | POA: Diagnosis not present

## 2021-03-19 DIAGNOSIS — Z0000 Encounter for general adult medical examination without abnormal findings: Secondary | ICD-10-CM

## 2021-03-19 DIAGNOSIS — K219 Gastro-esophageal reflux disease without esophagitis: Secondary | ICD-10-CM | POA: Diagnosis not present

## 2021-03-19 DIAGNOSIS — R61 Generalized hyperhidrosis: Secondary | ICD-10-CM | POA: Diagnosis not present

## 2021-03-19 DIAGNOSIS — K589 Irritable bowel syndrome without diarrhea: Secondary | ICD-10-CM

## 2021-03-19 DIAGNOSIS — N644 Mastodynia: Secondary | ICD-10-CM | POA: Insufficient documentation

## 2021-03-19 LAB — CBC WITH DIFFERENTIAL/PLATELET
Basophils Absolute: 0.1 10*3/uL (ref 0.0–0.1)
Basophils Relative: 0.7 % (ref 0.0–3.0)
Eosinophils Absolute: 0.2 10*3/uL (ref 0.0–0.7)
Eosinophils Relative: 2.2 % (ref 0.0–5.0)
HCT: 39.8 % (ref 36.0–46.0)
Hemoglobin: 13.1 g/dL (ref 12.0–15.0)
Lymphocytes Relative: 32.3 % (ref 12.0–46.0)
Lymphs Abs: 2.5 10*3/uL (ref 0.7–4.0)
MCHC: 32.9 g/dL (ref 30.0–36.0)
MCV: 82.3 fl (ref 78.0–100.0)
Monocytes Absolute: 0.7 10*3/uL (ref 0.1–1.0)
Monocytes Relative: 9.7 % (ref 3.0–12.0)
Neutro Abs: 4.2 10*3/uL (ref 1.4–7.7)
Neutrophils Relative %: 55.1 % (ref 43.0–77.0)
Platelets: 289 10*3/uL (ref 150.0–400.0)
RBC: 4.84 Mil/uL (ref 3.87–5.11)
RDW: 14.3 % (ref 11.5–15.5)
WBC: 7.6 10*3/uL (ref 4.0–10.5)

## 2021-03-19 LAB — LIPID PANEL
Cholesterol: 131 mg/dL (ref 0–200)
HDL: 40.2 mg/dL (ref >39.00)
LDL Cholesterol: 71 mg/dL (ref 0–99)
NonHDL: 91.18
Total CHOL/HDL Ratio: 3
Triglycerides: 102 mg/dL (ref 0.0–149.0)
VLDL: 20.4 mg/dL (ref 0.0–40.0)

## 2021-03-19 LAB — TSH: TSH: 1.67 u[IU]/mL (ref 0.35–4.50)

## 2021-03-19 MED ORDER — AMOXICILLIN-POT CLAVULANATE 875-125 MG PO TABS
1.0000 | ORAL_TABLET | Freq: Two times a day (BID) | ORAL | 0 refills | Status: DC
  Filled 2021-03-19: qty 14, 7d supply, fill #0

## 2021-03-19 MED ORDER — FLUCONAZOLE 150 MG PO TABS
ORAL_TABLET | ORAL | 0 refills | Status: DC
  Filled 2021-03-19: qty 2, 3d supply, fill #0

## 2021-03-19 NOTE — Assessment & Plan Note (Signed)
Normal breast exam today. She has hx of recurrent abscess and reports pain usually starts before abscess. Will rx with augmentin and she is advised to call if symptoms worsen or if symptoms do not resolve with augment. She is also due for screening mammogram and order has bene placed.

## 2021-03-19 NOTE — Assessment & Plan Note (Signed)
Stable on linzess 143mcg daily.  Having about 3 BM's a week.  Does not take every day.

## 2021-03-19 NOTE — Assessment & Plan Note (Signed)
Reports symptoms stable on protonix 40mg . Continue same.

## 2021-03-19 NOTE — Assessment & Plan Note (Signed)
Initial BP was elevated today. Repeat bp wnl. Continue losartan 50mg  and coreg 12.5mg  bid.

## 2021-03-19 NOTE — Patient Instructions (Signed)
Please complete lab work prior to leaving. Begin Augmentin for left Breast, call if symptoms worsen or if tenderness does not resolve with the antibiotics.

## 2021-03-19 NOTE — Progress Notes (Signed)
Subjective:   By signing my name below, I, Shehryar Baig, attest that this documentation has been prepared under the direction and in the presence of Debbrah Alar, NP. 03/19/2021   Patient ID: Jade [name], female    DOB: 06-16-1956, 65 y.o.   MRN: [id number]  Chief Complaint  Patient presents with  . Hypertension    Here for follow up    Hypertension   Patient is in today for a office visit. She is complaining of having night sweats recently and left breast pain for 2 weeks. She also notes some nipple drainage. She mentions that she has a infection in her breast. She denies having any fever. She is requesting antibiotics to manage her breast infection.   IBS- She is taking 145 mcg of linzess to manage her IBS symptoms. Hyperlipidemia- She is taking 80 mg atorvastatin daily PO to manage her hyperlipidemia Hypertension-  She stopped taking her blood thinner in the morning but instead take 81 mg aspirin twice daily. She is taking 50 mg losartan daily PO and 12.5 m carvedilol daily PO to manage her hypertension. BP Readings from Last 3 Encounters:  03/19/21 (!) 152/73  08/25/20 117/64  08/23/20 (!) 143/50   GERD- She is taking 40 mg Protonix daily PO to manage her symptoms.  Past Medical History:  Diagnosis Date  . Benign microscopic hematuria    neg cystoscopy 11/12- dr. Janice Norrie  . DJD (degenerative joint disease)    L KNEE  . Family history of anesthesia complication    multiple family members with history of this  . GERD (gastroesophageal reflux disease)   . Hurthle cell neoplasm of thyroid   . Hyperlipidemia LDL goal <70 10/23/2019  . Hypertension   . Malignant hyperthermia   . NSTEMI (non-ST elevated myocardial infarction) ([place]) 10/22/2019  . Numbness and tingling in left arm     Past Surgical History:  Procedure Laterality Date  . ABDOMINAL HYSTERECTOMY  1996  . BREAST EXCISIONAL BIOPSY Left 01/2016  . BREAST EXCISIONAL BIOPSY Left 03/2016  . CERVICAL DISC  SURGERY  2013   Dr. Arnoldo Morale  . CORONARY BALLOON ANGIOPLASTY N/A 10/25/2019   Procedure: CORONARY BALLOON ANGIOPLASTY;  Surgeon: Troy Sine, MD;  Location: [facility];  Service: Cardiovascular;  Laterality: N/A;  . CORONARY STENT INTERVENTION N/A 10/22/2019   Procedure: CORONARY STENT INTERVENTION;  Surgeon: Troy Sine, MD;  Location: [facility];  Service: Cardiovascular;  Laterality: N/A;  mid lad   . CORONARY STENT INTERVENTION N/A 10/25/2019   Procedure: CORONARY STENT INTERVENTION;  Surgeon: Troy Sine, MD;  Location: [place];  Service: Cardiovascular;  Laterality: N/A;  . DILATION AND CURETTAGE OF UTERUS    . KNEE CARTILAGE SURGERY  1999   right knee  . LEFT HEART CATH AND CORONARY ANGIOGRAPHY N/A 10/22/2019   Procedure: LEFT HEART CATH AND CORONARY ANGIOGRAPHY;  Surgeon: Troy Sine, MD;  Location: [place];  Service: Cardiovascular;  Laterality: N/A;  . ROTATOR CUFF REPAIR Right 05/25/13  . 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 TOTAL KNEE ARTHROPLASTY Right 01/19/2015   Procedure: RIGHT TOTAL KNEE ARTHROPLASTY;  Surgeon: Johnn Hai, MD;  Location: [facility];  Service: Orthopedics;  Laterality: Right;  . TUBAL LIGATION      Family History  Problem Relation Age of Onset  .  Hypertension Mother   . Hyperlipidemia Mother   . Heart disease Father        CAD; stent at age 66  . Diabetes Father   . Hypertension Father   . Hyperlipidemia Father   . Cancer Father        prostate  . Dementia Father   . Cancer Paternal Aunt        BREAST  . Breast cancer Paternal Aunt     Social History   Socioeconomic History  . Marital status: Married    Spouse name: Not on file  . Number of children: 3  . Years of education: Not on file  . Highest education level: Not on file  Occupational History   . Not on file  Tobacco Use  . Smoking status: Former Smoker    Packs/day: 1.00    Years: 39.00    Pack years: 39.00    Types: Cigarettes    Quit date: 11/2019    Years since quitting: 1.3  . Smokeless tobacco: Never Used  Substance and Sexual Activity  . Alcohol use: No    Alcohol/week: 0.0 standard drinks  . Drug use: No  . Sexual activity: Yes    Partners: Male  Other Topics Concern  . Not on file  Social History Narrative   Regular exercise:  No   Caffeine Use:  2 cups coffee and 3-4 glasses of soda daily.   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   Social Determinants of Health   Financial Resource Strain: Not on file  Food Insecurity: Not on file  Transportation Needs: Not on file  Physical Activity: Not on file  Stress: Not on file  Social Connections: Not on file  Intimate Partner Violence: Not on file    Outpatient Medications Prior to Visit  Medication Sig Dispense Refill  . aspirin 81 MG chewable tablet Chew 1 tablet (81 mg total) by mouth daily. 90 tablet 3  . atorvastatin (LIPITOR) 80 MG tablet Take 1 tablet (80 mg total) by mouth daily. 90 tablet 1  . carvedilol (COREG) 12.5 MG tablet Take 1 tablet (12.5 mg total) by mouth 2 (two) times daily with a meal. 180 tablet 1  . COVID-19 mRNA vaccine, Pfizer, 30 MCG/0.3ML injection INJECT AS DIRECTED .3 mL 0  . fluticasone (FLONASE) 50 MCG/ACT nasal spray USE 2 SPRAYS IN EACH NOSTRIL EVERY DAY 48 g 1  . gabapentin (NEURONTIN) 300 MG capsule TAKE 1 CAPSULE BY MOUTH DAILY IN THE MORNING, 1 AT NOON AND 2 AT BEDTIME. 360 capsule 0  . isosorbide mononitrate (IMDUR) 30 MG 24 hr tablet Take 1 tablet (30 mg total) by mouth daily. 90 tablet 1  . linaclotide (LINZESS) 145 MCG CAPS capsule Take 1 capsule (145 mcg total) by mouth daily before breakfast. 30 capsule 5  . losartan (COZAAR) 50 MG tablet Take 1 tablet (50 mg total) by mouth daily. 90 tablet 3  .  methocarbamol (ROBAXIN) 500 MG tablet Take 1 tablet (500 mg total) by mouth 2 (two) times daily as needed.  2  . nitroGLYCERIN (NITROSTAT) 0.4 MG SL tablet Place 1 tablet (0.4 mg total) under the tongue every 5 (five) minutes as needed for chest pain (CP or SOB). 25 tablet 3  . oxyCODONE-acetaminophen (PERCOCET) 7.5-325 MG tablet Take 1 tablet by mouth at bedtime as needed. 30 tablet 0  . pantoprazole (PROTONIX) 40 MG tablet Take 1 tablet (  40 mg total) by mouth 2 (two) times daily. 180 tablet 3  . potassium chloride SA (KLOR-CON M20) 20 MEQ tablet TAKE 1 TABLET (20 MEQ TOTAL) 2 (TWO) TIMES DAILY 180 tablet 0  . VENTOLIN HFA 108 (90 Base) MCG/ACT inhaler INHALE 2 PUFFS INTO THE LUNGS EVERY 6 HOURS AS NEEDED FOR WHEEZING OR SHORTNESS OF BREATH. 18 g 0  . Zoster Vaccine Adjuvanted [facility]) injection Inject 0.5mg  IM now and again in 2-6 months. 0.5 mL 1  . amoxicillin-clavulanate (AUGMENTIN) 875-125 MG tablet Take 1 tablet by mouth 2 (two) times daily. 20 tablet 0  . fluconazole (DIFLUCAN) 150 MG tablet 1 po x1, may repeat in 3 days prn 2 tablet 0  . ticagrelor (BRILINTA) 90 MG TABS tablet Take 1 tablet (90 mg total) by mouth 2 (two) times daily. 180 tablet 1   No facility-administered medications prior to visit.    Allergies  Allergen Reactions  . Shrimp [Shellfish Allergy] Itching    Rash and lips itching  . Ace Inhibitors Cough    Cough  . Aspirin Nausea And Vomiting  . Azithromycin Rash    Review of Systems  Constitutional: Negative for fever.       Objective:    Physical Exam Constitutional:      Appearance: She is well-developed.  HENT:     Head: Normocephalic and atraumatic.  Neck:     Thyroid: No thyromegaly.  Cardiovascular:     Rate and Rhythm: Normal rate and regular rhythm.     Pulses: Normal pulses.     Heart sounds: Normal heart sounds. No murmur heard.   Pulmonary:     Effort: Pulmonary effort is normal. No respiratory distress.     Breath sounds: Normal  breath sounds. No wheezing.  Chest:     Comments: Mild left breast tenderness starting at 11 oclock.  No erythema, mass or swelling. Musculoskeletal:     Cervical back: Neck supple.  Skin:    General: Skin is warm and dry.  Neurological:     Mental Status: She is alert and oriented to person, place, and time.  Psychiatric:        Behavior: Behavior normal.        Thought Content: Thought content normal.        Judgment: Judgment normal.     BP (!) 152/73 (BP Location: Right Arm, Patient Position: Sitting, Cuff Size: Large)   Pulse 61   Temp 98.6 F (37 C) (Oral)   Resp 16   Ht 5\' 6"  (1.676 m)   Wt 204 lb (92.5 kg)   LMP 11/25/1994   SpO2 100%   BMI 32.93 kg/m  Wt Readings from Last 3 Encounters:  03/19/21 204 lb (92.5 kg)  08/25/20 197 lb (89.4 kg)  08/23/20 190 lb (86.2 kg)    Diabetic Foot Exam - Simple   No data filed    Lab Results  Component Value Date   WBC 8.4 10/26/2019   HGB 11.6 (L) 10/26/2019   HCT 36.6 10/26/2019   PLT 343 10/26/2019   GLUCOSE 88 04/25/2020   CHOL 204 (H) 10/23/2019   TRIG 177 (H) 10/23/2019   HDL 27 (L) 10/23/2019   LDLDIRECT 175.0 08/10/2018   LDLCALC 142 (H) 10/23/2019   ALT 20 10/21/2019   AST 36 10/21/2019   NA 139 04/25/2020   K 3.9 04/25/2020   CL 106 04/25/2020   CREATININE 0.78 04/25/2020   BUN 14 04/25/2020   CO2 25 04/25/2020  TSH 1.17 04/25/2020   INR 1.00 01/13/2015   HGBA1C 6.4 04/25/2020    Lab Results  Component Value Date   TSH 1.17 04/25/2020   Lab Results  Component Value Date   WBC 8.4 10/26/2019   HGB 11.6 (L) 10/26/2019   HCT 36.6 10/26/2019   MCV 83.2 10/26/2019   PLT 343 10/26/2019   Lab Results  Component Value Date   NA 139 04/25/2020   K 3.9 04/25/2020   CO2 25 04/25/2020   GLUCOSE 88 04/25/2020   BUN 14 04/25/2020   CREATININE 0.78 04/25/2020   BILITOT 0.6 10/21/2019   ALKPHOS 85 10/21/2019   AST 36 10/21/2019   ALT 20 10/21/2019   PROT 8.2 (H) 10/21/2019   ALBUMIN 4.4  10/21/2019   CALCIUM 9.1 04/25/2020   ANIONGAP 16 (H) 10/26/2019   GFR 89.49 04/25/2020   Lab Results  Component Value Date   CHOL 204 (H) 10/23/2019   Lab Results  Component Value Date   HDL 27 (L) 10/23/2019   Lab Results  Component Value Date   LDLCALC 142 (H) 10/23/2019   Lab Results  Component Value Date   TRIG 177 (H) 10/23/2019   Lab Results  Component Value Date   CHOLHDL 7.6 10/23/2019   Lab Results  Component Value Date   HGBA1C 6.4 04/25/2020       Assessment & Plan:   Problem List Items Addressed This Visit   None      No orders of the defined types were placed in this encounter.   I, Shehryar [place], personally preformed the services described in this documentation.  All medical record entries made by the scribe were at my direction and in my presence.  I have reviewed the chart and discharge instructions (if applicable) and agree that the record reflects my personal performance and is accurate and complete. 03/19/2021   I,Shehryar Baig,acting as a Education administrator for Nance Pear, NP.,have documented all relevant documentation on the behalf of Nance Pear, NP,as directed by  Nance Pear, NP while in the presence of Nance Pear, NP.   Shehryar Moore, NP, have reviewed all documentation for this visit. The documentation on 03/19/21 for the exam, diagnosis, procedures, and orders are all accurate and complete.

## 2021-03-19 NOTE — Assessment & Plan Note (Signed)
Obtain follow up lipid panel. Continue atorvastatin 80mg 

## 2021-03-19 NOTE — Assessment & Plan Note (Signed)
Cardiology discontinued brillinta. She is now on aspirin 81mg .

## 2021-03-19 NOTE — Assessment & Plan Note (Signed)
New. Check TB gold, CBC, TSH.  She had negative HIV screen and has not been sexually active since that time.

## 2021-03-21 ENCOUNTER — Other Ambulatory Visit: Payer: Self-pay

## 2021-03-21 ENCOUNTER — Encounter: Payer: Self-pay | Admitting: Family

## 2021-03-21 ENCOUNTER — Ambulatory Visit (HOSPITAL_BASED_OUTPATIENT_CLINIC_OR_DEPARTMENT_OTHER)
Admission: RE | Admit: 2021-03-21 | Discharge: 2021-03-21 | Disposition: A | Discharge status: Home / Self Care | Payer: Medicare Other | Source: Ambulatory Visit | Attending: Family | Admitting: Family

## 2021-03-21 DIAGNOSIS — Z1231 Encounter for screening mammogram for malignant neoplasm of breast: Secondary | ICD-10-CM | POA: Diagnosis not present

## 2021-03-21 DIAGNOSIS — Z0000 Encounter for general adult medical examination without abnormal findings: Secondary | ICD-10-CM | POA: Diagnosis not present

## 2021-03-21 LAB — QUANTIFERON-TB GOLD PLUS
Mitogen-NIL: >10 IU/mL
NIL: 0.02 IU/mL
QuantiFERON-TB Gold Plus: NEGATIVE
TB1-NIL: 0 IU/mL
TB2-NIL: 0 IU/mL

## 2021-03-21 NOTE — Progress Notes (Signed)
Mailed out to pt 

## 2021-03-26 ENCOUNTER — Telehealth: Payer: Self-pay

## 2021-03-26 DIAGNOSIS — R61 Generalized hyperhidrosis: Secondary | ICD-10-CM

## 2021-03-26 DIAGNOSIS — I10 Essential (primary) hypertension: Secondary | ICD-10-CM

## 2021-03-26 NOTE — Addendum Note (Signed)
Addended by: Debbrah Alar on: 03/26/2021 01:41 PM   Modules accepted: Orders

## 2021-03-26 NOTE — Telephone Encounter (Signed)
Patient advised she needs additional labs and scheduled for Wednesday am

## 2021-03-26 NOTE — Telephone Encounter (Signed)
I would like to order some additional lab work to further evaluate her night sweats.  I will also check her potassium. Please schedule a lab appointment.

## 2021-03-26 NOTE — Telephone Encounter (Signed)
Patient called requests results from labs. I discussed labs and advised her her results have been mailed to her.   She is wondering where to go from here regarding her night sweats considering her labs were normal.   She also wanted to know about her potassium level. I did let her know her potassium wasn't checked this time. Does she need to come in soon to checkher potassium? Last level normal-checked 04/25/2020

## 2021-03-27 ENCOUNTER — Telehealth: Payer: Self-pay | Admitting: *Deleted

## 2021-03-27 DIAGNOSIS — R61 Generalized hyperhidrosis: Secondary | ICD-10-CM

## 2021-03-27 NOTE — Telephone Encounter (Signed)
Jade [name], Pt has lab appt tomorrow morning. One of her orders is not in correctly.  The blood culture says [facility] and we cannot draw for them.  Can you change order to [facility] or [facility] please?

## 2021-03-27 NOTE — Telephone Encounter (Signed)
Orders placed x 2 as she will need two bottles drawn please.

## 2021-03-28 ENCOUNTER — Other Ambulatory Visit: Payer: Self-pay

## 2021-03-28 ENCOUNTER — Other Ambulatory Visit (INDEPENDENT_AMBULATORY_CARE_PROVIDER_SITE_OTHER): Payer: Medicare Other

## 2021-03-28 DIAGNOSIS — I10 Essential (primary) hypertension: Secondary | ICD-10-CM

## 2021-03-28 DIAGNOSIS — R61 Generalized hyperhidrosis: Secondary | ICD-10-CM

## 2021-03-28 LAB — URINALYSIS, ROUTINE W REFLEX MICROSCOPIC
Bilirubin Urine: NEGATIVE
Hgb urine dipstick: NEGATIVE
Ketones, ur: NEGATIVE
Leukocytes,Ua: NEGATIVE
Nitrite: NEGATIVE
RBC / HPF: NONE SEEN (ref 0–?)
Specific Gravity, Urine: 1.025 (ref 1.000–1.030)
Total Protein, Urine: NEGATIVE
Urine Glucose: NEGATIVE
Urobilinogen, UA: 0.2 (ref 0.0–1.0)
pH: 5 (ref 5.0–8.0)

## 2021-03-28 LAB — C-REACTIVE PROTEIN: CRP: <1 mg/dL (ref 0.5–20.0)

## 2021-03-28 LAB — COMPREHENSIVE METABOLIC PANEL
ALT: 10 U/L (ref 0–35)
AST: 10 U/L (ref 0–37)
Albumin: 4.2 g/dL (ref 3.5–5.2)
Alkaline Phosphatase: 114 U/L (ref 39–117)
BUN: 13 mg/dL (ref 6–23)
CO2: 26 mEq/L (ref 19–32)
Calcium: 8.9 mg/dL (ref 8.4–10.5)
Chloride: 105 mEq/L (ref 96–112)
Creatinine, Ser: 0.87 mg/dL (ref 0.40–1.20)
GFR: 69.32 mL/min (ref >60.00)
Glucose, Bld: 108 mg/dL — ABNORMAL HIGH (ref 70–99)
Potassium: 4.1 mEq/L (ref 3.5–5.1)
Sodium: 139 mEq/L (ref 135–145)
Total Bilirubin: 0.5 mg/dL (ref 0.2–1.2)
Total Protein: 6.7 g/dL (ref 6.0–8.3)

## 2021-03-28 NOTE — Telephone Encounter (Signed)
Thank you :)

## 2021-04-03 LAB — CULTURE, BLOOD (SINGLE)
MICRO NUMBER:: 11849189
MICRO NUMBER:: 11849190
Result:: NO GROWTH
Result:: NO GROWTH
SPECIMEN QUALITY:: ADEQUATE
SPECIMEN QUALITY:: ADEQUATE

## 2021-04-13 DIAGNOSIS — Z20822 Contact with and (suspected) exposure to covid-19: Secondary | ICD-10-CM | POA: Diagnosis not present

## 2021-05-02 ENCOUNTER — Other Ambulatory Visit (HOSPITAL_COMMUNITY): Payer: Self-pay

## 2021-05-07 DIAGNOSIS — G894 Chronic pain syndrome: Secondary | ICD-10-CM | POA: Diagnosis not present

## 2021-05-07 DIAGNOSIS — Z5181 Encounter for therapeutic drug level monitoring: Secondary | ICD-10-CM | POA: Diagnosis not present

## 2021-05-07 DIAGNOSIS — Z79899 Other long term (current) drug therapy: Secondary | ICD-10-CM | POA: Diagnosis not present

## 2021-05-30 ENCOUNTER — Other Ambulatory Visit: Payer: Self-pay | Admitting: Family

## 2021-06-13 ENCOUNTER — Other Ambulatory Visit: Payer: Self-pay

## 2021-06-13 ENCOUNTER — Ambulatory Visit (INDEPENDENT_AMBULATORY_CARE_PROVIDER_SITE_OTHER): Payer: Medicare Other | Admitting: Family

## 2021-06-13 ENCOUNTER — Encounter: Payer: Self-pay | Admitting: Family

## 2021-06-13 VITALS — BP 120/50 | HR 76 | Temp 98.3°F | Resp 18 | Ht 66.0 in | Wt 206.0 lb

## 2021-06-13 DIAGNOSIS — R7303 Prediabetes: Secondary | ICD-10-CM

## 2021-06-13 DIAGNOSIS — E042 Nontoxic multinodular goiter: Secondary | ICD-10-CM | POA: Diagnosis not present

## 2021-06-13 DIAGNOSIS — K589 Irritable bowel syndrome without diarrhea: Secondary | ICD-10-CM | POA: Diagnosis not present

## 2021-06-13 DIAGNOSIS — I10 Essential (primary) hypertension: Secondary | ICD-10-CM | POA: Diagnosis not present

## 2021-06-13 DIAGNOSIS — Z1211 Encounter for screening for malignant neoplasm of colon: Secondary | ICD-10-CM

## 2021-06-13 DIAGNOSIS — E785 Hyperlipidemia, unspecified: Secondary | ICD-10-CM | POA: Diagnosis not present

## 2021-06-13 DIAGNOSIS — Z720 Tobacco use: Secondary | ICD-10-CM | POA: Diagnosis not present

## 2021-06-13 LAB — HEMOGLOBIN A1C: Hgb A1c MFr Bld: 6.7 % — ABNORMAL HIGH (ref 4.6–6.5)

## 2021-06-13 NOTE — Assessment & Plan Note (Signed)
BP Readings from Last 3 Encounters:  06/13/21 (!) 120/50  03/19/21 128/72  08/25/20 117/64   Stable on losartan 50mg  and coreg 12.5mg  bid.

## 2021-06-13 NOTE — Assessment & Plan Note (Signed)
Lab Results  Component Value Date   HGBA1C 6.4 04/25/2020   HGBA1C 6.0 (H) 10/22/2019   HGBA1C 6.1 08/10/2018   Lab Results  Component Value Date   LDLCALC 71 03/19/2021   CREATININE 0.87 03/28/2021

## 2021-06-13 NOTE — Assessment & Plan Note (Signed)
Uses linzess prn.  Reports that over stable.

## 2021-06-13 NOTE — Assessment & Plan Note (Signed)
Lab Results  Component Value Date   TSH 1.67 03/19/2021   TSH stable.  Monitor.

## 2021-06-13 NOTE — Progress Notes (Signed)
Subjective:   By signing my name below, I, Shehryar Baig, attest that this documentation has been prepared under the direction and in the presence of Debbrah Alar NP. 06/13/2021   Patient ID: Jade [name], female    DOB: 1955/12/06, 65 y.o.   MRN: [id number]  Chief Complaint  Patient presents with   Follow-up    3 month     HPI Patient is in today for an office visit  Night Sweats: She reports they have not improved since her last visit. She reports that they happen every night but she does not have to get up to change her clothes but notes that they are wet. Diabetes: Her sugar levels were elevated during her last lab work.  Lab Results  Component Value Date   HGBA1C 6.7 (H) 06/13/2021    Hyperlipidemia: She has been managing her symptoms with 80 mg Lipitor PO daily and reports doing well on it. Hypertension: She has been taking 50 mg Iosartan PO daily and 12.5 mg carvedilol 2x daily PO to manage her symptoms and reports doing well on it. BP Readings from Last 3 Encounters:  06/13/21 (!) 120/50  03/19/21 128/72  08/25/20 117/64   Colonoscopy: She is due for a colonoscopy and is willing to set an appointment today. IBS: She is managing her symptoms with 145 mcg linaclotide PO prn, typically taken after work. She reports doing well on it. Arthritis: She reports that her hip is still bothering her and plans to reduce her work schedule to manage her pain.  Smoking: She reports that she smokes half a pack of cigarettes a day. Immunizations: She has 3 Covid-19 vaccines at this time. She recently recovered from [place] in May and is willing to get a second booster vaccine. She is not interested in getting a Hepatitis C screening at this time. She received the 2017 shingles vaccines but is due for the shingrex vaccine.  Health Maintenance Due  Topic Date Due   Zoster Vaccines- Shingrix (1 of 2) Never done   COLONOSCOPY (Pts 45-22yrs Insurance coverage will need to be  confirmed)  11/25/2018   PNA vac Low Risk Adult (2 of 2 - PCV13) 10/12/2020   COVID-19 Vaccine (4 - Booster for Pfizer series) 12/23/2020    Past Medical History:  Diagnosis Date   Benign microscopic hematuria    neg cystoscopy 11/12- dr. Janice Norrie   DJD (degenerative joint disease)    L KNEE   Family history of anesthesia complication    multiple family members with history of this   GERD (gastroesophageal reflux disease)    Hurthle cell neoplasm of thyroid    Hyperlipidemia LDL goal <70 10/23/2019   Hypertension    Malignant hyperthermia    NSTEMI (non-ST elevated myocardial infarction) ([place]) 10/22/2019   Numbness and tingling in left arm     Past Surgical History:  Procedure Laterality Date   [place] EXCISIONAL BIOPSY Left 01/2016   BREAST EXCISIONAL BIOPSY Left 03/2016   CERVICAL DISC SURGERY  2013   Dr. Arnoldo Morale   CORONARY BALLOON ANGIOPLASTY N/A 10/25/2019   Procedure: CORONARY BALLOON ANGIOPLASTY;  Surgeon: Troy Sine, MD;  Location: [facility];  Service: Cardiovascular;  Laterality: N/A;   CORONARY STENT INTERVENTION N/A 10/22/2019   Procedure: CORONARY STENT INTERVENTION;  Surgeon: Troy Sine, MD;  Location: [facility];  Service: Cardiovascular;  Laterality: N/A;  mid lad    CORONARY STENT INTERVENTION N/A 10/25/2019  Procedure: CORONARY STENT INTERVENTION;  Surgeon: Troy Sine, MD;  Location: [facility];  Service: Cardiovascular;  Laterality: N/A;   DILATION AND CURETTAGE OF UTERUS     KNEE CARTILAGE SURGERY  1999   right knee   LEFT HEART CATH AND CORONARY ANGIOGRAPHY N/A 10/22/2019   Procedure: LEFT HEART CATH AND CORONARY ANGIOGRAPHY;  Surgeon: Troy Sine, MD;  Location: [facility];  Service: Cardiovascular;  Laterality: N/A;   ROTATOR CUFF REPAIR Right 05/25/13   TOTAL KNEE ARTHROPLASTY Left 01/20/2014   Procedure: LEFT TOTAL KNEE ARTHROPLASTY;  Surgeon: Johnn Hai, MD;  Location:  [facility];  Service: Orthopedics;  Laterality: Left;   TOTAL KNEE ARTHROPLASTY Right 01/19/2015   Procedure: RIGHT TOTAL KNEE ARTHROPLASTY;  Surgeon: Johnn Hai, MD;  Location: [facility];  Service: Orthopedics;  Laterality: Right;   TUBAL LIGATION      Family History  Problem Relation Age of Onset   Hypertension Mother    Hyperlipidemia Mother    Heart disease Father        CAD; stent at age 53   Diabetes Father    Hypertension Father    Hyperlipidemia Father    Cancer Father        prostate   Dementia Father    Cancer Paternal Aunt        BREAST   Breast cancer Paternal Aunt     Social History   Socioeconomic History   Marital status: Married    Spouse name: Not on file   Number of children: 3   Years of education: Not on file   Highest education level: Not on file  Occupational History   Not on file  Tobacco Use   Smoking status: Former    Packs/day: 0.50    Years: 39.00    Pack years: 19.50    Types: Cigarettes    Quit date: 11/2019    Years since quitting: 1.5   Smokeless tobacco: Never  Substance and Sexual Activity   Alcohol use: No    Alcohol/week: 0.0 standard drinks   Drug use: No   Sexual activity: Yes    Partners: Male  Other Topics Concern   Not on file  Social History Narrative   Regular exercise:  No   Caffeine Use:  2 cups coffee and 3-4 glasses of soda daily.   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   Social Determinants of Health   Financial Resource Strain: Not on file  Food Insecurity: Not on file  Transportation Needs: Not on file  Physical Activity: Not on file  Stress: Not on file  Social Connections: Not on file  Intimate Partner Violence: Not on file    Outpatient Medications Prior to Visit  Medication Sig Dispense Refill   amoxicillin-clavulanate (AUGMENTIN) 875-125 MG tablet Take 1 tablet by mouth 2 (two) times daily. 14 tablet 0   aspirin 81 MG chewable tablet  Chew 1 tablet (81 mg total) by mouth daily. 90 tablet 3   atorvastatin (LIPITOR) 80 MG tablet Take 1 tablet (80 mg total) by mouth daily. 90 tablet 1   carvedilol (COREG) 12.5 MG tablet Take 1 tablet (12.5 mg total) by mouth 2 (two) times daily with a meal. 180 tablet 1   COVID-19 mRNA vaccine, Pfizer, 30 MCG/0.3ML injection INJECT AS DIRECTED .3 mL 0   fluconazole (DIFLUCAN) 150 MG tablet Take 1 tablet  by mouth as needed for vaginal yeast infection. May repeat in 3 days as needed 2 tablet 0   fluticasone (FLONASE) 50 MCG/ACT nasal spray USE 2 SPRAYS IN EACH NOSTRIL EVERY DAY 48 g 1   gabapentin (NEURONTIN) 300 MG capsule TAKE 1 CAPSULE BY MOUTH DAILY IN THE MORNING, 1 AT NOON AND 2 AT BEDTIME. 360 capsule 0   isosorbide mononitrate (IMDUR) 30 MG 24 hr tablet Take 1 tablet (30 mg total) by mouth daily. 90 tablet 1   linaclotide (LINZESS) 145 MCG CAPS capsule Take 1 capsule (145 mcg total) by mouth daily before breakfast. 30 capsule 5   losartan (COZAAR) 50 MG tablet Take 1 tablet (50 mg total) by mouth daily. 90 tablet 3   methocarbamol (ROBAXIN) 500 MG tablet Take 1 tablet (500 mg total) by mouth 2 (two) times daily as needed.  2   nitroGLYCERIN (NITROSTAT) 0.4 MG SL tablet Place 1 tablet (0.4 mg total) under the tongue every 5 (five) minutes as needed for chest pain (CP or SOB). 25 tablet 3   oxyCODONE-acetaminophen (PERCOCET) 7.5-325 MG tablet Take 1 tablet by mouth at bedtime as needed. 30 tablet 0   pantoprazole (PROTONIX) 40 MG tablet Take 1 tablet (40 mg total) by mouth 2 (two) times daily. 180 tablet 3   potassium chloride SA (KLOR-CON M20) 20 MEQ tablet TAKE 1 TABLET (20 MEQ TOTAL) 2 (TWO) TIMES DAILY 180 tablet 0   VENTOLIN HFA 108 (90 Base) MCG/ACT inhaler INHALE 2 PUFFS INTO THE LUNGS EVERY 6 HOURS AS NEEDED FOR WHEEZING OR SHORTNESS OF BREATH. 18 g 0   Zoster Vaccine Adjuvanted [facility]) injection Inject 0.5mg  IM now and again in 2-6 months. 0.5 mL 1   No facility-administered  medications prior to visit.    Allergies  Allergen Reactions   Shrimp [Shellfish Allergy] Itching    Rash and lips itching   Ace Inhibitors Cough    Cough   Aspirin Nausea And Vomiting   Azithromycin Rash    ROS     Objective:    Physical Exam Constitutional:      General: She is not in acute distress.    Appearance: Normal appearance. She is not ill-appearing.  HENT:     Head: Normocephalic and atraumatic.     Right Ear: External ear normal.     Left Ear: External ear normal.  Eyes:     Extraocular Movements: Extraocular movements intact.     Pupils: Pupils are equal, round, and reactive to light.  Cardiovascular:     Rate and Rhythm: Normal rate and regular rhythm.     Pulses: Normal pulses.     Heart sounds: Normal heart sounds.  Pulmonary:     Effort: Pulmonary effort is normal. No respiratory distress.     Breath sounds: Normal breath sounds. No wheezing, rhonchi or rales.  Skin:    General: Skin is warm and dry.  Neurological:     Mental Status: She is alert and oriented to person, place, and time.  Psychiatric:        Behavior: Behavior normal.        Judgment: Judgment normal.    BP (!) 120/50   Pulse 76   Temp 98.3 F (36.8 C)   Resp 18   Ht 5\' 6"  (1.676 m)   Wt 206 lb (93.4 kg)   LMP 11/25/1994   SpO2 97%   BMI 33.25 kg/m  Wt Readings from Last 3 Encounters:  06/13/21 206 lb (93.4 kg)  03/19/21 204 lb (92.5 kg)  08/25/20 197 lb (89.4 kg)       Assessment & Plan:   Problem List Items Addressed This Visit       Unprioritized   Tobacco abuse    Has started smoking again- counseled on cessation.        Multinodular goiter    Lab Results  Component Value Date   TSH 1.67 03/19/2021  TSH stable.  Monitor.       IBS (irritable bowel syndrome)    Uses linzess prn.  Reports that over stable.         Hypertension    BP Readings from Last 3 Encounters:  06/13/21 (!) 120/50  03/19/21 128/72  08/25/20 117/64  Stable on losartan  50mg  and coreg 12.5mg  bid.       Hyperlipidemia LDL goal <70    Lab Results  Component Value Date   CHOL 131 03/19/2021   HDL 40.20 03/19/2021   LDLCALC 71 03/19/2021   LDLDIRECT 175.0 08/10/2018   TRIG 102.0 03/19/2021   CHOLHDL 3 03/19/2021  Stable on lipitor 80mg .        Borderline diabetes    Lab Results  Component Value Date   HGBA1C 6.4 04/25/2020   HGBA1C 6.0 (H) 10/22/2019   HGBA1C 6.1 08/10/2018   Lab Results  Component Value Date   LDLCALC 71 03/19/2021   CREATININE 0.87 03/28/2021        Relevant Orders   Hemoglobin A1c (Completed)   Other Visit Diagnoses     Colon cancer screening    -  Primary   Relevant Orders   Ambulatory referral to Gastroenterology        No orders of the defined types were placed in this encounter.   I, Debbrah Alar NP., personally preformed the services described in this documentation.  All medical record entries made by the scribe were at my direction and in my presence.  I have reviewed the chart and discharge instructions (if applicable) and agree that the record reflects my personal performance and is accurate and complete. 06/13/2021   I,Shehryar Baig,acting as a Education administrator for Nance Pear, NP.,have documented all relevant documentation on the behalf of Nance Pear, NP,as directed by  Nance Pear, NP while in the presence of Nance Pear, NP.   Nance Pear, NP

## 2021-06-13 NOTE — Assessment & Plan Note (Signed)
Lab Results  Component Value Date   CHOL 131 03/19/2021   HDL 40.20 03/19/2021   LDLCALC 71 03/19/2021   LDLDIRECT 175.0 08/10/2018   TRIG 102.0 03/19/2021   CHOLHDL 3 03/19/2021   Stable on lipitor 80mg .

## 2021-06-13 NOTE — Assessment & Plan Note (Signed)
Has started smoking again- counseled on cessation.

## 2021-06-13 NOTE — Patient Instructions (Signed)
Please complete lab work prior to leaving.   

## 2021-06-14 ENCOUNTER — Encounter: Payer: Self-pay | Admitting: Family

## 2021-06-18 ENCOUNTER — Ambulatory Visit: Payer: Medicare Other | Admitting: Family

## 2021-06-20 NOTE — Progress Notes (Signed)
Mailed out to pt 

## 2021-06-26 DIAGNOSIS — I10 Essential (primary) hypertension: Secondary | ICD-10-CM | POA: Diagnosis not present

## 2021-06-26 DIAGNOSIS — R0600 Dyspnea, unspecified: Secondary | ICD-10-CM | POA: Diagnosis not present

## 2021-06-26 DIAGNOSIS — I2510 Atherosclerotic heart disease of native coronary artery without angina pectoris: Secondary | ICD-10-CM | POA: Diagnosis not present

## 2021-06-26 DIAGNOSIS — E785 Hyperlipidemia, unspecified: Secondary | ICD-10-CM | POA: Diagnosis not present

## 2021-08-26 ENCOUNTER — Other Ambulatory Visit: Payer: Self-pay | Admitting: Family

## 2021-08-27 ENCOUNTER — Other Ambulatory Visit: Payer: Self-pay | Admitting: Family

## 2021-09-06 ENCOUNTER — Ambulatory Visit (INDEPENDENT_AMBULATORY_CARE_PROVIDER_SITE_OTHER): Payer: Medicare Other

## 2021-09-06 VITALS — Ht 66.0 in | Wt 206.0 lb

## 2021-09-06 DIAGNOSIS — Z1211 Encounter for screening for malignant neoplasm of colon: Secondary | ICD-10-CM

## 2021-09-06 DIAGNOSIS — Z0000 Encounter for general adult medical examination without abnormal findings: Secondary | ICD-10-CM | POA: Diagnosis not present

## 2021-09-06 DIAGNOSIS — Z780 Asymptomatic menopausal state: Secondary | ICD-10-CM

## 2021-09-06 NOTE — Progress Notes (Signed)
Subjective:   [name] is a 65 y.o. female who presents for Medicare Annual (Subsequent) preventive examination.  I connected with Francis today by telephone and verified that I am speaking with the correct person using two identifiers. Location patient: home Location provider: work Persons participating in the virtual visit: patient, Marine scientist.    I discussed the limitations, risks, security and privacy concerns of performing an evaluation and management service by telephone and the availability of in person appointments. I also discussed with the patient that there may be a patient responsible charge related to this service. The patient expressed understanding and verbally consented to this telephonic visit.    Interactive audio and video telecommunications were attempted between this provider and patient, however failed, due to patient having technical difficulties OR patient did not have access to video capability.  We continued and completed visit with audio only.  Some vital signs may be absent or patient reported.   Time Spent with patient on telephone encounter: 20 minutes   Review of Systems     Cardiac Risk Factors include: advanced age (>57men, >66 women);dyslipidemia;hypertension;obesity (BMI >30kg/m2);sedentary lifestyle;smoking/ tobacco exposure     Objective:    Today's Vitals   09/06/21 0857  Weight: 206 lb (93.4 kg)  Height: 5\' 6"  (1.676 m)  PainSc: 8    Body mass index is 33.25 kg/m.  Advanced Directives 09/06/2021 08/23/2020 10/21/2019 04/28/2017 08/08/2015 01/19/2015 01/13/2015  Does Patient Have a Medical Advance Directive? No No No No No No No  Would patient like information on creating a medical advance directive? Yes (MAU/Ambulatory/Procedural Areas - Information given) - No - Patient declined - No - patient declined information No - patient declined information No - patient declined information  Pre-existing out of facility DNR order (yellow form or pink  MOST form) - - - - - - -    Current Medications (verified) Outpatient Encounter Medications as of 09/06/2021  Medication Sig   aspirin 81 MG chewable tablet Chew 1 tablet (81 mg total) by mouth daily.   atorvastatin (LIPITOR) 80 MG tablet Take 1 tablet (80 mg total) by mouth daily.   carvedilol (COREG) 12.5 MG tablet Take 1 tablet (12.5 mg total) by mouth 2 (two) times daily with a meal.   COVID-19 mRNA vaccine, Pfizer, 30 MCG/0.3ML injection INJECT AS DIRECTED   fluconazole (DIFLUCAN) 150 MG tablet Take 1 tablet by mouth as needed for vaginal yeast infection. May repeat in 3 days as needed   fluticasone (FLONASE) 50 MCG/ACT nasal spray USE 2 SPRAYS IN EACH NOSTRIL EVERY DAY   gabapentin (NEURONTIN) 300 MG capsule TAKE 1 CAPSULE BY MOUTH DAILY IN THE MORNING, 1 AT NOON AND 2 AT BEDTIME.   isosorbide mononitrate (IMDUR) 30 MG 24 hr tablet Take 1 tablet (30 mg total) by mouth daily.   linaclotide (LINZESS) 145 MCG CAPS capsule Take 1 capsule (145 mcg total) by mouth daily before breakfast.   losartan (COZAAR) 50 MG tablet Take 1 tablet (50 mg total) by mouth daily.   methocarbamol (ROBAXIN) 500 MG tablet Take 1 tablet (500 mg total) by mouth 2 (two) times daily as needed.   nitroGLYCERIN (NITROSTAT) 0.4 MG SL tablet Place 1 tablet (0.4 mg total) under the tongue every 5 (five) minutes as needed for chest pain (CP or SOB).   oxyCODONE-acetaminophen (PERCOCET) 7.5-325 MG tablet Take 1 tablet by mouth at bedtime as needed.   pantoprazole (PROTONIX) 40 MG tablet Take 1 tablet (40 mg total) by mouth 2 (  two) times daily.   potassium chloride SA (KLOR-CON M20) 20 MEQ tablet TAKE 1 TABLET (20 MEQ TOTAL) 2 (TWO) TIMES DAILY   VENTOLIN HFA 108 (90 Base) MCG/ACT inhaler INHALE 2 PUFFS INTO THE LUNGS EVERY 6 HOURS AS NEEDED FOR WHEEZING OR SHORTNESS OF BREATH.   Zoster Vaccine Adjuvanted [facility]) injection Inject 0.5mg  IM now and again in 2-6 months. (Patient not taking: Reported on 09/06/2021)    [DISCONTINUED] amoxicillin-clavulanate (AUGMENTIN) 875-125 MG tablet Take 1 tablet by mouth 2 (two) times daily.   No facility-administered encounter medications on file as of 09/06/2021.    Allergies (verified) Shrimp [shellfish allergy], Ace inhibitors, Aspirin, and Azithromycin   History: Past Medical History:  Diagnosis Date   Benign microscopic hematuria    neg cystoscopy 11/12- dr. Janice Norrie   DJD (degenerative joint disease)    L KNEE   Family history of anesthesia complication    multiple family members with history of this   GERD (gastroesophageal reflux disease)    Hurthle cell neoplasm of thyroid    Hyperlipidemia LDL goal <70 10/23/2019   Hypertension    Malignant hyperthermia    NSTEMI (non-ST elevated myocardial infarction) ([place]) 10/22/2019   Numbness and tingling in left arm    Past Surgical History:  Procedure Laterality Date   ABDOMINAL HYSTERECTOMY  1996   BREAST EXCISIONAL BIOPSY Left 01/2016   BREAST EXCISIONAL BIOPSY Left 03/2016   CERVICAL DISC SURGERY  2013   Dr. Arnoldo Morale   CORONARY BALLOON ANGIOPLASTY N/A 10/25/2019   Procedure: CORONARY BALLOON ANGIOPLASTY;  Surgeon: Troy Sine, MD;  Location: [place] [facility];  Service: Cardiovascular;  Laterality: N/A;   CORONARY STENT INTERVENTION N/A 10/22/2019   Procedure: CORONARY STENT INTERVENTION;  Surgeon: Troy Sine, MD;  Location: [place] [facility];  Service: Cardiovascular;  Laterality: N/A;  mid lad    CORONARY STENT INTERVENTION N/A 10/25/2019   Procedure: CORONARY STENT INTERVENTION;  Surgeon: Troy Sine, MD;  Location: [place] [facility];  Service: Cardiovascular;  Laterality: N/A;   DILATION AND CURETTAGE OF UTERUS     KNEE CARTILAGE SURGERY  1999   right knee   LEFT HEART CATH AND CORONARY ANGIOGRAPHY N/A 10/22/2019   Procedure: LEFT HEART CATH AND CORONARY ANGIOGRAPHY;  Surgeon: Troy Sine, MD;  Location: [place] [facility];  Service: Cardiovascular;  Laterality: N/A;   ROTATOR  CUFF REPAIR Right 05/25/13   TOTAL KNEE ARTHROPLASTY Left 01/20/2014   Procedure: LEFT TOTAL KNEE ARTHROPLASTY;  Surgeon: Johnn Hai, MD;  Location: [facility];  Service: Orthopedics;  Laterality: Left;   TOTAL KNEE ARTHROPLASTY Right 01/19/2015   Procedure: RIGHT TOTAL KNEE ARTHROPLASTY;  Surgeon: Johnn Hai, MD;  Location: [facility];  Service: Orthopedics;  Laterality: Right;   TUBAL LIGATION     Family History  Problem Relation Age of Onset   Hypertension Mother    Hyperlipidemia Mother    Heart disease Father        CAD; stent at age 41   Diabetes Father    Hypertension Father    Hyperlipidemia Father    Cancer Father        prostate   Dementia Father    Cancer Paternal Aunt        BREAST   Breast cancer Paternal Aunt    Social History   Socioeconomic History   Marital status: Married    Spouse name: Not on file   Number of children: 3   Years of  education: Not on file   Highest education level: Not on file  Occupational History   Not on file  Tobacco Use   Smoking status: Every Day    Packs/day: 0.50    Years: 39.00    Pack years: 19.50    Types: Cigarettes    Last attempt to quit: 11/2019    Years since quitting: 1.7   Smokeless tobacco: Never  Substance and Sexual Activity   Alcohol use: No    Alcohol/week: 0.0 standard drinks   Drug use: No   Sexual activity: Yes    Partners: Male  Other Topics Concern   Not on file  Social History Narrative   Regular exercise:  No   Caffeine Use:  2 cups coffee and 3-4 glasses of soda daily.   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   Social Determinants of Health   Financial Resource Strain: Low Risk    Difficulty of Paying Living Expenses: Not hard at all  Food Insecurity: No Food Insecurity   Worried About Charity fundraiser in the Last Year: Never true   [place] in the Last Year: Never true  Transportation Needs: No Transportation Needs    Lack of Transportation (Medical): No   Lack of Transportation (Non-Medical): No  Physical Activity: Inactive   Days of Exercise per Week: 0 days   Minutes of Exercise per Session: 0 min  Stress: No Stress Concern Present   Feeling of Stress : Not at all  Social Connections: Socially Integrated   Frequency of Communication with Friends and Family: More than three times a week   Frequency of Social Gatherings with Friends and Family: More than three times a week   Attends Religious Services: More than 4 times per year   Active Member of [employer] or Organizations: Yes   Attends Music therapist: More than 4 times per year   Marital Status: Married    Tobacco Counseling Ready to quit: Yes Counseling given: Not Answered   Clinical Intake:  Pre-visit preparation completed: Yes  Pain : 0-10 Pain Score: 8  Pain Type: Chronic pain Pain Location: Knee (and hip) Pain Onset: More than a month ago Pain Frequency: Constant Pain Relieving Factors: sees pain management  Pain Relieving Factors: sees pain management  BMI - recorded: 33.25 Nutritional Status: BMI > 30  Obese Nutritional Risks: None Diabetes: No  How often do you need to have someone help you when you read instructions, pamphlets, or other written materials from your doctor or pharmacy?: 1 - Never  Diabetic?No  Interpreter Needed?: No  Information entered by :: Caroleen Hamman LPN   Activities of Daily Living In your present state of health, do you have any difficulty performing the following activities: 09/06/2021 03/19/2021  Hearing? N N  Vision? N N  Difficulty concentrating or making decisions? N N  Walking or climbing stairs? N N  Dressing or bathing? N N  Doing errands, shopping? N N  Preparing Food and eating ? N -  Using the Toilet? N -  In the past six months, have you accidently leaked urine? N -  Do you have problems with loss of bowel control? N -  Managing your Medications? N -  Managing  your Finances? N -  Housekeeping or managing your Housekeeping? N -  Some recent data might be hidden    Patient Care Team: Debbrah Alar,  NP as PCP - General (Internal Medicine) Stanford Breed, Denice Bors, MD as PCP - Cardiology (Cardiology) Calvert Cantor, MD as Consulting Physician (Ophthalmology) Stanford Breed Denice Bors, MD as Consulting Physician (Cardiology)  Indicate any recent Medical Services you may have received from other than [facility] providers in the past year (date may be approximate).     Assessment:   This is a routine wellness examination for [place].  Hearing/Vision screen Hearing Screening - Comments:: No issues Vision Screening - Comments:: Wears glasses Last eye exam-2 years ago-[facility]  Dietary issues and exercise activities discussed: Current Exercise Habits: The patient does not participate in regular exercise at present, Exercise limited by: orthopedic condition(s)   Goals Addressed             This Visit's Progress    Increase physical activity   Not on track    Silver Sneakers and join gym.     Quit Smoking         Depression Screen PHQ 2/9 Scores 09/06/2021 08/25/2020 02/08/2019 11/07/2017 10/09/2016 08/08/2015  PHQ - 2 Score 0 3 0 0 0 0  PHQ- 9 Score - 4 0 0 - -    Fall Risk Fall Risk  09/06/2021 03/19/2021 11/30/2019 10/09/2016 08/08/2015  Falls in the past year? 0 1 0 No No  Number falls in past yr: 0 0 0 - -  Injury with Fall? 0 - 0 - -  Follow up Falls prevention discussed - - - -    FALL RISK PREVENTION PERTAINING TO THE HOME:  Any stairs in or around the home? No  Home free of loose throw rugs in walkways, pet beds, electrical cords, etc? Yes  Adequate lighting in your home to reduce risk of falls? Yes   ASSISTIVE DEVICES UTILIZED TO PREVENT FALLS:  Life alert? No  Use of a cane, walker or w/c? No  Grab bars in the bathroom? Yes  Shower chair or bench in shower? Yes  Elevated toilet seat or a handicapped toilet? No   TIMED UP  AND GO:  Was the test performed? No . Phone visit   Cognitive Function:Normal cognitive status assessed by this Nurse Health Advisor. No abnormalities found.   MMSE - Mini Mental State Exam 08/08/2015  Not completed: (No Data)        Immunizations Immunization History  Administered Date(s) Administered   Fluad Quad(high Dose 65+) 10/13/2019, 08/25/2020   Influenza Split 10/11/2011   Influenza,inj,Quad PF,6+ Mos 08/17/2013, 08/29/2014, 08/08/2015, 10/16/2016, 08/18/2017, 08/10/2018   PFIZER(Purple Top)SARS-COV-2 Vaccination 01/01/2020, 01/22/2020, 09/22/2020   Pneumococcal Polysaccharide-23 04/20/2013, 10/13/2019   Td 11/25/2006   Tdap 07/15/2011   Zoster, Live 11/06/2016    TDAP status: Due, Education has been provided regarding the importance of this vaccine. Advised may receive this vaccine at local pharmacy or Health Dept. Aware to provide a copy of the vaccination record if obtained from local pharmacy or Health Dept. Verbalized acceptance and understanding.  Flu Vaccine status: Due, Education has been provided regarding the importance of this vaccine. Advised may receive this vaccine at local pharmacy or Health Dept. Aware to provide a copy of the vaccination record if obtained from local pharmacy or Health Dept. Verbalized acceptance and understanding.  Pneumococcal vaccine status: Due, Education has been provided regarding the importance of this vaccine. Advised may receive this vaccine at local pharmacy or Health Dept. Aware to provide a copy of the vaccination record if obtained from local pharmacy or Health Dept. Verbalized acceptance and understanding.  Covid-19 vaccine  status: Information provided on how to obtain vaccines. Booster due  Qualifies for Shingles Vaccine? Yes   Zostavax completed Yes   Shingrix Completed?: No.    Education has been provided regarding the importance of this vaccine. Patient has been advised to call insurance company to determine out of  pocket expense if they have not yet received this vaccine. Advised may also receive vaccine at local pharmacy or Health Dept. Verbalized acceptance and understanding.  Screening Tests Health Maintenance  Topic Date Due   Zoster Vaccines- Shingrix (1 of 2) Never done   COLONOSCOPY (Pts 45-34yrs Insurance coverage will need to be confirmed)  11/25/2018   COVID-19 Vaccine (4 - Booster for Pfizer series) 12/15/2020   INFLUENZA VACCINE  06/25/2021   TETANUS/TDAP  07/14/2021   MAMMOGRAM  03/22/2023   DEXA SCAN  Completed   HPV VACCINES  Aged Out   Hepatitis C Screening  Discontinued    Health Maintenance  Health Maintenance Due  Topic Date Due   Zoster Vaccines- Shingrix (1 of 2) Never done   COLONOSCOPY (Pts 45-82yrs Insurance coverage will need to be confirmed)  11/25/2018   COVID-19 Vaccine (4 - Booster for [place] series) 12/15/2020   INFLUENZA VACCINE  06/25/2021   TETANUS/TDAP  07/14/2021    Colorectal cancer screening: Referral to GI placed today. Pt aware the office will call re: appt.  Mammogram status: Completed bilateral 03/21/2021. Repeat every year  Bone Density status: Ordered today. Pt provided with contact info and advised to call to schedule appt.  Lung Cancer Screening: (Low Dose CT Chest recommended if Age 53-80 years, 30 pack-year currently smoking OR have quit w/in 15years.) does not qualify.     Additional Screening:  Hepatitis C Screening: does qualify; Patient declined  Vision Screening: Recommended annual ophthalmology exams for early detection of glaucoma and other disorders of the eye. Is the patient up to date with their annual eye exam?  No  Who is the provider or what is the name of the office in which the patient attends annual eye exams? [facility] Patient plans to make an appt soon.  Dental Screening: Recommended annual dental exams for proper oral hygiene  Community Resource Referral / Chronic Care Management: CRR required this  visit?  No   CCM required this visit?  No      Plan:     I have personally reviewed and noted the following in the patient's chart:   Medical and social history Use of alcohol, tobacco or illicit drugs  Current medications and supplements including opioid prescriptions.  Functional ability and status Nutritional status Physical activity Advanced directives List of other physicians Hospitalizations, surgeries, and ER visits in previous 12 months Vitals Screenings to include cognitive, depression, and falls Referrals and appointments  In addition, I have reviewed and discussed with patient certain preventive protocols, quality metrics, and best practice recommendations. A written personalized care plan for preventive services as well as general preventive health recommendations were provided to patient.   Due to this being a telephonic visit, the after visit summary with patients personalized plan was offered to patient via mail or my-chart. Per request, patient was mailed a copy of [place], LPN   12/45/8099  Nurse Health Advisor  Nurse Notes: None

## 2021-09-06 NOTE — Patient Instructions (Signed)
Jade [name] , Thank you for taking time to complete your Medicare Wellness Visit. I appreciate your ongoing commitment to your health goals. Please review the following plan we discussed and let me know if I can assist you in the future.   Screening recommendations/referrals: Colonoscopy: Ordered today. Someone will call you to schedule. Mammogram: Completed 03/21/2021-Due 03/21/2022 Bone Density: Ordered today. Someone will call you to schedule. Recommended yearly ophthalmology/optometry visit for glaucoma screening and checkup Recommended yearly dental visit for hygiene and checkup  Vaccinations: Influenza vaccine: Due-May obtain vaccine at our office or your local pharmacy. Pneumococcal vaccine: Due for Prevnar-May obtain vaccine at our office or your local pharmacy. Tdap vaccine: Discuss with pharmacy Shingles vaccine: Discuss with pharmacy   Covid-19:Booster available at the pharmacy  Advanced directives: Information mailed today  Conditions/risks identified: See problem list  Next appointment: Follow up in one year for your annual wellness visit 09/09/2022 @ 9:40   Preventive Care 65 Years and Older, Female Preventive care refers to lifestyle choices and visits with your health care provider that can promote health and wellness. What does preventive care include? A yearly physical exam. This is also called an annual well check. Dental exams once or twice a year. Routine eye exams. Ask your health care provider how often you should have your eyes checked. Personal lifestyle choices, including: Daily care of your teeth and gums. Regular physical activity. Eating a healthy diet. Avoiding tobacco and drug use. Limiting alcohol use. Practicing safe sex. Taking low-dose aspirin every day. Taking vitamin and mineral supplements as recommended by your health care provider. What happens during an annual well check? The services and screenings done by your health care provider  during your annual well check will depend on your age, overall health, lifestyle risk factors, and family history of disease. Counseling  Your health care provider may ask you questions about your: Alcohol use. Tobacco use. Drug use. Emotional well-being. Home and relationship well-being. Sexual activity. Eating habits. History of falls. Memory and ability to understand (cognition). Work and work Statistician. Reproductive health. Screening  You may have the following tests or measurements: Height, weight, and BMI. Blood pressure. Lipid and cholesterol levels. These may be checked every 5 years, or more frequently if you are over 78 years old. Skin check. Lung cancer screening. You may have this screening every year starting at age 23 if you have a 30-pack-year history of smoking and currently smoke or have quit within the past 15 years. Fecal occult blood test (FOBT) of the stool. You may have this test every year starting at age 19. Flexible sigmoidoscopy or colonoscopy. You may have a sigmoidoscopy every 5 years or a colonoscopy every 10 years starting at age 85. Hepatitis C blood test. Hepatitis B blood test. Sexually transmitted disease (STD) testing. Diabetes screening. This is done by checking your blood sugar (glucose) after you have not eaten for a while (fasting). You may have this done every 1-3 years. Bone density scan. This is done to screen for osteoporosis. You may have this done starting at age 64. Mammogram. This may be done every 1-2 years. Talk to your health care provider about how often you should have regular mammograms. Talk with your health care provider about your test results, treatment options, and if necessary, the need for more tests. Vaccines  Your health care provider may recommend certain vaccines, such as: Influenza vaccine. This is recommended every year. Tetanus, diphtheria, and acellular pertussis (Tdap, Td) vaccine. You may need a  Td booster every  10 years. Zoster vaccine. You may need this after age 52. Pneumococcal 13-valent conjugate (PCV13) vaccine. One dose is recommended after age 75. Pneumococcal polysaccharide (PPSV23) vaccine. One dose is recommended after age 80. Talk to your health care provider about which screenings and vaccines you need and how often you need them. This information is not intended to replace advice given to you by your health care provider. Make sure you discuss any questions you have with your health care provider. Document Released: 12/08/2015 Document Revised: 07/31/2016 Document Reviewed: 09/12/2015 Elsevier Interactive Patient Education  2017 [employer] Prevention in the Home Falls can cause injuries. They can happen to people of all ages. There are many things you can do to make your home safe and to help prevent falls. What can I do on the outside of my home? Regularly fix the edges of walkways and driveways and fix any cracks. Remove anything that might make you trip as you walk through a door, such as a raised step or threshold. Trim any bushes or trees on the path to your home. Use bright outdoor lighting. Clear any walking paths of anything that might make someone trip, such as rocks or tools. Regularly check to see if handrails are loose or broken. Make sure that both sides of any steps have handrails. Any raised decks and porches should have guardrails on the edges. Have any leaves, snow, or ice cleared regularly. Use sand or salt on walking paths during winter. Clean up any spills in your garage right away. This includes oil or grease spills. What can I do in the bathroom? Use night lights. Install grab bars by the toilet and in the tub and shower. Do not use towel bars as grab bars. Use non-skid mats or decals in the tub or shower. If you need to sit down in the shower, use a plastic, non-slip stool. Keep the floor dry. Clean up any water that spills on the floor as soon as it  happens. Remove soap buildup in the tub or shower regularly. Attach bath mats securely with double-sided non-slip rug tape. Do not have throw rugs and other things on the floor that can make you trip. What can I do in the bedroom? Use night lights. Make sure that you have a light by your bed that is easy to reach. Do not use any sheets or blankets that are too big for your bed. They should not hang down onto the floor. Have a firm chair that has side arms. You can use this for support while you get dressed. Do not have throw rugs and other things on the floor that can make you trip. What can I do in the kitchen? Clean up any spills right away. Avoid walking on wet floors. Keep items that you use a lot in easy-to-reach places. If you need to reach something above you, use a strong step stool that has a grab bar. Keep electrical cords out of the way. Do not use floor polish or wax that makes floors slippery. If you must use wax, use non-skid floor wax. Do not have throw rugs and other things on the floor that can make you trip. What can I do with my stairs? Do not leave any items on the stairs. Make sure that there are handrails on both sides of the stairs and use them. Fix handrails that are broken or loose. Make sure that handrails are as long as the stairways. Check any  carpeting to make sure that it is firmly attached to the stairs. Fix any carpet that is loose or worn. Avoid having throw rugs at the top or bottom of the stairs. If you do have throw rugs, attach them to the floor with carpet tape. Make sure that you have a light switch at the top of the stairs and the bottom of the stairs. If you do not have them, ask someone to add them for you. What else can I do to help prevent falls? Wear shoes that: Do not have high heels. Have rubber bottoms. Are comfortable and fit you well. Are closed at the toe. Do not wear sandals. If you use a stepladder: Make sure that it is fully opened.  Do not climb a closed stepladder. Make sure that both sides of the stepladder are locked into place. Ask someone to hold it for you, if possible. Clearly mark and make sure that you can see: Any grab bars or handrails. First and last steps. Where the edge of each step is. Use tools that help you move around (mobility aids) if they are needed. These include: Canes. Walkers. Scooters. Crutches. Turn on the lights when you go into a dark area. Replace any light bulbs as soon as they burn out. Set up your furniture so you have a clear path. Avoid moving your furniture around. If any of your floors are uneven, fix them. If there are any pets around you, be aware of where they are. Review your medicines with your doctor. Some medicines can make you feel dizzy. This can increase your chance of falling. Ask your doctor what other things that you can do to help prevent falls. This information is not intended to replace advice given to you by your health care provider. Make sure you discuss any questions you have with your health care provider. Document Released: 09/07/2009 Document Revised: 04/18/2016 Document Reviewed: 12/16/2014 Elsevier Interactive Patient Education  2017 [employer].

## 2021-09-11 DIAGNOSIS — Z96652 Presence of left artificial knee joint: Secondary | ICD-10-CM | POA: Diagnosis not present

## 2021-09-11 DIAGNOSIS — M5459 Other low back pain: Secondary | ICD-10-CM | POA: Diagnosis not present

## 2021-09-11 DIAGNOSIS — G894 Chronic pain syndrome: Secondary | ICD-10-CM | POA: Diagnosis not present

## 2021-09-11 DIAGNOSIS — Z96651 Presence of right artificial knee joint: Secondary | ICD-10-CM | POA: Diagnosis not present

## 2021-09-11 DIAGNOSIS — Z79891 Long term (current) use of opiate analgesic: Secondary | ICD-10-CM | POA: Diagnosis not present

## 2021-09-17 ENCOUNTER — Ambulatory Visit (HOSPITAL_BASED_OUTPATIENT_CLINIC_OR_DEPARTMENT_OTHER)
Admission: RE | Admit: 2021-09-17 | Discharge: 2021-09-17 | Disposition: A | Discharge status: Home / Self Care | Payer: Medicare Other | Source: Ambulatory Visit | Attending: Family | Admitting: Family

## 2021-09-17 ENCOUNTER — Other Ambulatory Visit: Payer: Self-pay

## 2021-09-17 DIAGNOSIS — Z780 Asymptomatic menopausal state: Secondary | ICD-10-CM | POA: Insufficient documentation

## 2021-09-19 ENCOUNTER — Encounter: Payer: Self-pay | Admitting: Family

## 2021-09-19 NOTE — Progress Notes (Signed)
Mailed out to patient 

## 2021-10-03 ENCOUNTER — Ambulatory Visit (INDEPENDENT_AMBULATORY_CARE_PROVIDER_SITE_OTHER): Payer: Medicare Other | Admitting: *Deleted

## 2021-10-03 ENCOUNTER — Other Ambulatory Visit: Payer: Self-pay

## 2021-10-03 ENCOUNTER — Encounter: Payer: Self-pay | Admitting: Family

## 2021-10-03 DIAGNOSIS — Z23 Encounter for immunization: Secondary | ICD-10-CM | POA: Diagnosis not present

## 2021-10-03 NOTE — Progress Notes (Signed)
Patient here for pneumonia vaccine and high dose flu vaccine.  Pneumonia vaccine given in left deltoid and flu vaccine given in right deltoid.  Patient tolerated both vaccines well.

## 2021-11-06 ENCOUNTER — Other Ambulatory Visit (HOSPITAL_BASED_OUTPATIENT_CLINIC_OR_DEPARTMENT_OTHER): Payer: Self-pay

## 2021-11-06 ENCOUNTER — Ambulatory Visit (INDEPENDENT_AMBULATORY_CARE_PROVIDER_SITE_OTHER): Payer: Medicare Other | Admitting: Family

## 2021-11-06 VITALS — BP 136/60 | HR 67 | Temp 98.4°F | Resp 16 | Ht 66.0 in | Wt 199.0 lb

## 2021-11-06 DIAGNOSIS — R300 Dysuria: Secondary | ICD-10-CM | POA: Diagnosis not present

## 2021-11-06 LAB — POC URINALSYSI DIPSTICK (AUTOMATED)
Blood, UA: NEGATIVE
Glucose, UA: NEGATIVE
Ketones, UA: NEGATIVE
Leukocytes, UA: NEGATIVE
Nitrite, UA: NEGATIVE
Protein, UA: NEGATIVE
Spec Grav, UA: 1.01 (ref 1.010–1.025)
Urobilinogen, UA: 1 E.U./dL
pH, UA: 5 (ref 5.0–8.0)

## 2021-11-06 MED ORDER — CEPHALEXIN 500 MG PO CAPS
500.0000 mg | ORAL_CAPSULE | Freq: Three times a day (TID) | ORAL | 0 refills | Status: DC
  Filled 2021-11-06: qty 21, 7d supply, fill #0

## 2021-11-06 NOTE — Patient Instructions (Signed)
Begin Keflex for urinary tract infection. Continue to monitor symptoms and scheduled another appointment if worsen or fail to improve.

## 2021-11-06 NOTE — Assessment & Plan Note (Signed)
New. UA unremarkable, but hx and exam concerning for UTI. Will begin empiric keflex while we wait on urine culture. In the meantime, pt is advised to call if symptoms worsen or if not improved in the next few days.

## 2021-11-06 NOTE — Progress Notes (Addendum)
Subjective:   By signing my name below, I, [name], attest that this documentation has been prepared under the direction and in the presence of Debbrah Alar, NP, 11/06/2021    Patient ID: Jade [name], female    DOB: Jun 11, 1956, 65 y.o.   MRN: [id number]  Chief Complaint  Patient presents with   Dysuria    Patient complains of burning and discomfort with urination   Back Pain    Complains of low back pain    HPI Patient is in today for an office visit.  Kidney concerns: She is experiencing pain while urinating and left side flank pain. She denies any blood in her urine or fever. She does note abdominal pain in the suprapubic region.   Health Maintenance Due  Topic Date Due   Zoster Vaccines- Shingrix (1 of 2) Never done   COLONOSCOPY (Pts 45-65yrs Insurance coverage will need to be confirmed)  11/25/2018   COVID-19 Vaccine (4 - Booster for Pfizer series) 11/17/2020   TETANUS/TDAP  07/14/2021    Past Medical History:  Diagnosis Date   Benign microscopic hematuria    neg cystoscopy 11/12- dr. Janice Norrie   DJD (degenerative joint disease)    L KNEE   Family history of anesthesia complication    multiple family members with history of this   GERD (gastroesophageal reflux disease)    Hurthle cell neoplasm of thyroid    Hyperlipidemia LDL goal <70 10/23/2019   Hypertension    Malignant hyperthermia    NSTEMI (non-ST elevated myocardial infarction) ([facility]) 10/22/2019   Numbness and tingling in left arm     Past Surgical History:  Procedure Laterality Date   [place] EXCISIONAL BIOPSY Left 01/2016   BREAST EXCISIONAL BIOPSY Left 03/2016   CERVICAL DISC SURGERY  2013   Dr. Arnoldo Morale   CORONARY BALLOON ANGIOPLASTY N/A 10/25/2019   Procedure: CORONARY BALLOON ANGIOPLASTY;  Surgeon: Troy Sine, MD;  Location: [facility];  Service: Cardiovascular;  Laterality: N/A;   CORONARY STENT INTERVENTION N/A 10/22/2019    Procedure: CORONARY STENT INTERVENTION;  Surgeon: Troy Sine, MD;  Location: [facility];  Service: Cardiovascular;  Laterality: N/A;  mid lad    CORONARY STENT INTERVENTION N/A 10/25/2019   Procedure: CORONARY STENT INTERVENTION;  Surgeon: Troy Sine, MD;  Location: [facility];  Service: Cardiovascular;  Laterality: N/A;   DILATION AND CURETTAGE OF UTERUS     KNEE CARTILAGE SURGERY  1999   right knee   LEFT HEART CATH AND CORONARY ANGIOGRAPHY N/A 10/22/2019   Procedure: LEFT HEART CATH AND CORONARY ANGIOGRAPHY;  Surgeon: Troy Sine, MD;  Location: [facility];  Service: Cardiovascular;  Laterality: N/A;   ROTATOR CUFF REPAIR Right 05/25/13   TOTAL KNEE ARTHROPLASTY Left 01/20/2014   Procedure: LEFT TOTAL KNEE ARTHROPLASTY;  Surgeon: Johnn Hai, MD;  Location: [facility];  Service: Orthopedics;  Laterality: Left;   TOTAL KNEE ARTHROPLASTY Right 01/19/2015   Procedure: RIGHT TOTAL KNEE ARTHROPLASTY;  Surgeon: Johnn Hai, MD;  Location: [facility];  Service: Orthopedics;  Laterality: Right;   TUBAL LIGATION      Family History  Problem Relation Age of Onset   Hypertension Mother    Hyperlipidemia Mother    Heart disease Father        CAD; stent at age 53   Diabetes Father    Hypertension Father    Hyperlipidemia Father  Cancer Father        prostate   Dementia Father    Cancer Paternal Aunt        BREAST   Breast cancer Paternal Aunt     Social History   Socioeconomic History   Marital status: Married    Spouse name: Not on file   Number of children: 3   Years of education: Not on file   Highest education level: Not on file  Occupational History   Not on file  Tobacco Use   Smoking status: Every Day    Packs/day: 0.50    Years: 39.00    Pack years: 19.50    Types: Cigarettes    Last attempt to quit: 11/2019    Years since quitting: 1.9   Smokeless tobacco: Never  Substance and Sexual Activity   Alcohol use: No    Alcohol/week:  0.0 standard drinks   Drug use: No   Sexual activity: Yes    Partners: Male  Other Topics Concern   Not on file  Social History Narrative   Regular exercise:  No   Caffeine Use:  2 cups coffee and 3-4 glasses of soda daily.   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   Social Determinants of Health   Financial Resource Strain: Low Risk    Difficulty of Paying Living Expenses: Not hard at all  Food Insecurity: No Food Insecurity   Worried About Charity fundraiser in the Last Year: Never true   [place] in the Last Year: Never true  Transportation Needs: No Transportation Needs   Lack of Transportation (Medical): No   Lack of Transportation (Non-Medical): No  Physical Activity: Inactive   Days of Exercise per Week: 0 days   Minutes of Exercise per Session: 0 min  Stress: No Stress Concern Present   Feeling of Stress : Not at all  Social Connections: Socially Integrated   Frequency of Communication with Friends and Family: More than three times a week   Frequency of Social Gatherings with Friends and Family: More than three times a week   Attends Religious Services: More than 4 times per year   Active Member of [employer] or Organizations: Yes   Attends Music therapist: More than 4 times per year   Marital Status: Married  Human resources officer Violence: Not At Risk   Fear of Current or Ex-Partner: No   Emotionally Abused: No   Physically Abused: No   Sexually Abused: No    Outpatient Medications Prior to Visit  Medication Sig Dispense Refill   aspirin 81 MG chewable tablet Chew 1 tablet (81 mg total) by mouth daily. 90 tablet 3   atorvastatin (LIPITOR) 80 MG tablet Take 1 tablet (80 mg total) by mouth daily. 90 tablet 1   carvedilol (COREG) 12.5 MG tablet Take 1 tablet (12.5 mg total) by mouth 2 (two) times daily with a meal. 180 tablet 1   fluconazole (DIFLUCAN) 150 MG tablet Take 1 tablet by mouth as  needed for vaginal yeast infection. May repeat in 3 days as needed 2 tablet 0   fluticasone (FLONASE) 50 MCG/ACT nasal spray USE 2 SPRAYS IN EACH NOSTRIL EVERY DAY 48 g 1   gabapentin (NEURONTIN) 300 MG capsule TAKE 1 CAPSULE BY MOUTH DAILY IN THE MORNING, 1 AT NOON AND 2 AT BEDTIME. 360 capsule 0   isosorbide mononitrate (IMDUR) 30 MG  24 hr tablet Take 1 tablet (30 mg total) by mouth daily. 90 tablet 1   linaclotide (LINZESS) 145 MCG CAPS capsule Take 1 capsule (145 mcg total) by mouth daily before breakfast. 30 capsule 5   losartan (COZAAR) 50 MG tablet Take 1 tablet (50 mg total) by mouth daily. 90 tablet 3   methocarbamol (ROBAXIN) 500 MG tablet Take 1 tablet (500 mg total) by mouth 2 (two) times daily as needed.  2   nitroGLYCERIN (NITROSTAT) 0.4 MG SL tablet Place 1 tablet (0.4 mg total) under the tongue every 5 (five) minutes as needed for chest pain (CP or SOB). 25 tablet 3   oxyCODONE-acetaminophen (PERCOCET) 7.5-325 MG tablet Take 1 tablet by mouth at bedtime as needed. 30 tablet 0   pantoprazole (PROTONIX) 40 MG tablet Take 1 tablet (40 mg total) by mouth 2 (two) times daily. 180 tablet 3   potassium chloride SA (KLOR-CON M20) 20 MEQ tablet TAKE 1 TABLET (20 MEQ TOTAL) 2 (TWO) TIMES DAILY 180 tablet 0   VENTOLIN HFA 108 (90 Base) MCG/ACT inhaler INHALE 2 PUFFS INTO THE LUNGS EVERY 6 HOURS AS NEEDED FOR WHEEZING OR SHORTNESS OF BREATH. 18 g 0   Zoster Vaccine Adjuvanted (SHINGRIX) injection Inject 0.5mg  IM now and again in 2-6 months. (Patient not taking: Reported on 09/06/2021) 0.5 mL 1   No facility-administered medications prior to visit.    Allergies  Allergen Reactions   Shrimp [Shellfish Allergy] Itching    Rash and lips itching   Ace Inhibitors Cough    Cough   Aspirin Nausea And Vomiting   Azithromycin Rash    Review of Systems  Constitutional:  Negative for fever.  Genitourinary:  Positive for dysuria, flank pain and frequency. Negative for hematuria.       Objective:    Physical Exam Constitutional:      General: She is not in acute distress.    Appearance: Normal appearance. She is not ill-appearing.  HENT:     Head: Normocephalic and atraumatic.     Right Ear: External ear normal.     Left Ear: External ear normal.  Eyes:     Extraocular Movements: Extraocular movements intact.     Pupils: Pupils are equal, round, and reactive to light.  Cardiovascular:     Rate and Rhythm: Normal rate and regular rhythm.     Heart sounds: Normal heart sounds. No murmur heard.   No gallop.  Pulmonary:     Effort: Pulmonary effort is normal. No respiratory distress.     Breath sounds: Normal breath sounds. No wheezing or rales.  Abdominal:     Tenderness: There is left CVA tenderness. There is no right CVA tenderness.  Skin:    General: Skin is warm and dry.  Neurological:     Mental Status: She is alert and oriented to person, place, and time.  Psychiatric:        Behavior: Behavior normal.        Judgment: Judgment normal.    BP 136/60 (BP Location: Right Arm, Patient Position: Sitting, Cuff Size: Small)    Pulse 67    Temp 98.4 F (36.9 C) (Oral)    Resp 16    Ht 5\' 6"  (1.676 m)    Wt 199 lb (90.3 kg)    LMP 11/25/1994    SpO2 100%    BMI 32.12 kg/m  Wt Readings from Last 3 Encounters:  11/06/21 199 lb (90.3 kg)  09/06/21 206 lb (93.4 kg)  06/13/21 206 lb (93.4 kg)       Assessment & Plan:   Problem List Items Addressed This Visit       Unprioritized   Dysuria - Primary    New. UA unremarkable, but hx and exam concerning for UTI. Will begin empiric keflex while we wait on urine culture. In the meantime, pt is advised to call if symptoms worsen or if not improved in the next few days.       Relevant Orders   POCT Urinalysis Dipstick (Automated)   Urine Culture   Meds ordered this encounter  Medications   cephALEXin (KEFLEX) 500 MG capsule    Sig: Take 1 capsule (500 mg total) by mouth 3 (three) times daily.    Dispense:   21 capsule    Refill:  0    Order Specific Question:   Supervising Provider    Answer:   Penni Homans A [[id number]]    I, Debbrah Alar, NP, personally preformed the services described in this documentation.  All medical record entries made by the scribe were at my direction and in my presence.  I have reviewed the chart and discharge instructions (if applicable) and agree that the record reflects my personal performance and is accurate and complete. 11/06/2021  I,[name],acting as a Education administrator for Nance Pear, NP.,have documented all relevant documentation on the behalf of Nance Pear, NP,as directed by  Nance Pear, NP while in the presence of Nance Pear, NP.  Nance Pear, NP

## 2021-11-07 ENCOUNTER — Other Ambulatory Visit: Payer: Self-pay | Admitting: Family

## 2021-11-07 LAB — URINE CULTURE
MICRO NUMBER:: 12750707
Result:: NO GROWTH
SPECIMEN QUALITY:: ADEQUATE

## 2021-11-09 ENCOUNTER — Other Ambulatory Visit: Payer: Self-pay

## 2021-11-09 ENCOUNTER — Telehealth: Payer: Self-pay | Admitting: *Deleted

## 2021-11-09 ENCOUNTER — Other Ambulatory Visit: Payer: Self-pay | Admitting: Family

## 2021-11-09 ENCOUNTER — Encounter: Payer: Self-pay | Admitting: Gastroenterology

## 2021-11-09 ENCOUNTER — Telehealth: Payer: Self-pay | Admitting: Family

## 2021-11-09 ENCOUNTER — Ambulatory Visit (AMBULATORY_SURGERY_CENTER): Payer: Medicare Other | Admitting: *Deleted

## 2021-11-09 VITALS — Ht 66.0 in | Wt 199.0 lb

## 2021-11-09 DIAGNOSIS — Z1211 Encounter for screening for malignant neoplasm of colon: Secondary | ICD-10-CM

## 2021-11-09 MED ORDER — CLENPIQ 10-3.5-12 MG-GM -GM/160ML PO SOLN: 1.0000 | ORAL | 0 refills | Status: DC

## 2021-11-09 NOTE — Progress Notes (Unsigned)
Urine culture is negative for infection. She can discontinue keflex.

## 2021-11-09 NOTE — Telephone Encounter (Signed)
Urine culture is negative. She can stop keflex.

## 2021-11-09 NOTE — Telephone Encounter (Signed)
If she develops vaginal itching/discharge or if symptoms do not improve, please schedule a follow up visit and we can do a vaginal swab to further evaluate.

## 2021-11-09 NOTE — Telephone Encounter (Signed)
John,   Please review patient's history. [place] for [employer]? Thank you, Feiga Nadel pv

## 2021-11-09 NOTE — Progress Notes (Signed)
Patient's pre-visit was done today over the phone with the patient. Name,DOB and address verified. Patient denies any allergies to Eggs and Soy. Patient has malignant hyperthermia . Patient is not taking any diet pills or blood thinners. No home Oxygen. Packet of Prep instructions mailed to patient including a copy of a consent form-pt is aware. Prep instructions sent to pt's MyChart (if activated).Patient understands to call us back with any questions or concerns. Patient is aware of our care-partner policy and [id number] safety protocol.  The patient is COVID-19 vaccinated.

## 2021-11-09 NOTE — Telephone Encounter (Signed)
Patient advised of results and to discontinue antibiotic.  She reports she is still having the same discomfort with urination. "Wondering if is a yeast infection"

## 2021-11-12 NOTE — Telephone Encounter (Signed)
Noted  

## 2021-11-12 NOTE — Telephone Encounter (Signed)
Patient reports she is not better and has vaginal itching. She was scheduled to come in tomorrow for swab

## 2021-11-13 ENCOUNTER — Ambulatory Visit (INDEPENDENT_AMBULATORY_CARE_PROVIDER_SITE_OTHER): Payer: Medicare Other | Admitting: Family

## 2021-11-13 ENCOUNTER — Other Ambulatory Visit (HOSPITAL_BASED_OUTPATIENT_CLINIC_OR_DEPARTMENT_OTHER): Payer: Self-pay

## 2021-11-13 ENCOUNTER — Other Ambulatory Visit (HOSPITAL_COMMUNITY)
Admission: RE | Admit: 2021-11-13 | Discharge: 2021-11-13 | Disposition: A | Discharge status: Home / Self Care | Payer: Medicare Other | Source: Ambulatory Visit | Attending: Family | Admitting: Family

## 2021-11-13 VITALS — BP 137/63 | HR 66 | Temp 98.5°F | Resp 16 | Wt 197.0 lb

## 2021-11-13 DIAGNOSIS — N898 Other specified noninflammatory disorders of vagina: Secondary | ICD-10-CM | POA: Insufficient documentation

## 2021-11-13 DIAGNOSIS — J309 Allergic rhinitis, unspecified: Secondary | ICD-10-CM

## 2021-11-13 DIAGNOSIS — R1032 Left lower quadrant pain: Secondary | ICD-10-CM | POA: Insufficient documentation

## 2021-11-13 DIAGNOSIS — I10 Essential (primary) hypertension: Secondary | ICD-10-CM | POA: Diagnosis not present

## 2021-11-13 LAB — BASIC METABOLIC PANEL
BUN: 16 mg/dL (ref 6–23)
CO2: 26 mEq/L (ref 19–32)
Calcium: 9.3 mg/dL (ref 8.4–10.5)
Chloride: 104 mEq/L (ref 96–112)
Creatinine, Ser: 0.96 mg/dL (ref 0.40–1.20)
GFR: 61.32 mL/min (ref >60.00)
Glucose, Bld: 81 mg/dL (ref 70–99)
Potassium: 4.1 mEq/L (ref 3.5–5.1)
Sodium: 137 mEq/L (ref 135–145)

## 2021-11-13 MED ORDER — MONTELUKAST SODIUM 10 MG PO TABS
10.0000 mg | ORAL_TABLET | Freq: Every day | ORAL | 3 refills | Status: DC
  Filled 2021-11-13: qty 30, 30d supply, fill #0

## 2021-11-13 MED ORDER — FLUCONAZOLE 150 MG PO TABS
ORAL_TABLET | ORAL | 0 refills | Status: DC
  Filled 2021-11-13: qty 2, 3d supply, fill #0

## 2021-11-13 NOTE — Patient Instructions (Addendum)
Begin diflucan for potential yeast infection.   Begin Singulair for nasal congestion.  We will work on scheduling a CT scan for abdominal pain at your earliest convenience.  Continue to monitor symptoms, if worsened or not resolved please schedule another appointment.

## 2021-11-13 NOTE — Assessment & Plan Note (Signed)
Present x 1 week. UA/culture negative. Recommended CT abd/pelvis to further evaluate. She does have an upcoming colonoscopy scheduled as well.

## 2021-11-13 NOTE — Assessment & Plan Note (Signed)
New. Not improved with zyrtec and flonase. Advised pt to continue these medications, but to add singulair 10 mg once daily.

## 2021-11-13 NOTE — Progress Notes (Signed)
Subjective:   By signing my name below, I, [name], attest that this documentation has been prepared under the direction and in the presence of Debbrah Alar, NP, 11/13/2021   Patient ID: Jade [name], female    DOB: 09/03/1956, 65 y.o.   MRN: [id number]  Chief Complaint  Patient presents with   Vaginal Itching    Complains of vaginal itching w/o discharge    HPI Patient is in today for an office visit.  Genital concerns: She was seen in the office previously for vaginal burning and itching. Her urine tested negative for a UTI and the culture also came back negative. She is in the office today still complaining of vaginal burning and itching. She notes a mild discharge also present.  Sinus drainage: She mentions sinus drainage that began two weeks ago. She has used zyrtec and Flonase which have not helped at all. She has also tested for Covid-19 three times and all times they have come back negative. Abdominal pain: She is experiencing abdominal pain and notes that she is having regular bowel movement. This has been occurring for about 2 weeks.   Health Maintenance Due  Topic Date Due   Zoster Vaccines- Shingrix (1 of 2) Never done   COLONOSCOPY (Pts 45-12yr Insurance coverage will need to be confirmed)  11/25/2018   COVID-19 Vaccine (4 - Booster for Pfizer series) 11/17/2020   TETANUS/TDAP  07/14/2021    Past Medical History:  Diagnosis Date   Benign microscopic hematuria    neg cystoscopy 11/12- dr. nJanice Norrie  DJD (degenerative joint disease)    L KNEE   Family history of anesthesia complication    multiple family members with history of this   GERD (gastroesophageal reflux disease)    Hurthle cell neoplasm of thyroid    Hyperlipidemia LDL goal <70 10/23/2019   Hypertension    Malignant hyperthermia    [place]STEM[place] (non-ST elevated myocardial infarction) [place]   Numbness and tingling in left arm     Past Surgical History:  Procedure Laterality  Date   [place]   BRE[place] EXCISIONAL BIOPSY Left 03/2016   CERVICAL DISC SURGERY  2013   Dr. JArnoldo Morale  [place]  [place]   CORONARY BALLOON ANGIOPLASTY N/A 10/25/2019   [place]  Surgeon: KTroy Sine MD;  [facility];  Service: Cardiovascular;  Laterality: N/A;   CORONARY STENT INTERVENTION N/A 10/22/2019   Procedure: CORONARY STENT INTERVENTION;  Surgeon: KTroy Sine MD;  [facility];  Service: Cardiovascular;  Laterality: N/A;  mid lad    CORONARY STENT INTERVENTION N/A 10/25/2019   Procedure: CORONARY STENT INTERVENTION;  Surgeon: KTroy Sine MD;  [facility];  Service: Cardiovascular;  Laterality: N/A;   DILATION AND CURETTAGE OF UTERUS     KNEE CARTILAGE SURGERY  1999   right knee   LEFT HEART CATH AND CORONARY ANGIOGRAPHY N/A 10/22/2019   Procedure: LEFT HEART CATH AND CORONARY ANGIOGRAPHY;  Surgeon: KTroy Sine MD;  [facility];  Service: Cardiovascular;  Laterality: N/A;   ROTATOR CUFF REPAIR Right 05/25/2013   TOTAL KNEE ARTHROPLASTY Left 01/20/2014   Procedure: LEFT TOTAL KNEE ARTHROPLASTY;  Surgeon: JJohnn Hai MD;  Location: [facility];  Service: Orthopedics;  Laterality: Left;   TOTAL KNEE ARTHROPLASTY Right 01/19/2015   Procedure: RIGHT TOTAL KNEE  ARTHROPLASTY;  Surgeon: Johnn Hai, MD;  Location: [facility];  Service: Orthopedics;  Laterality: Right;   TUBAL LIGATION      Family History  Problem Relation Age of Onset   Hypertension Mother    Hyperlipidemia Mother    Heart disease Father        CAD; stent at age 54   Diabetes Father    Hypertension Father    Hyperlipidemia Father    Cancer Father        prostate   Dementia Father    Cancer Paternal Aunt        BREAST   Breast cancer Paternal Aunt    Colon cancer Neg Hx     Social History    Socioeconomic History   Marital status: Married    Spouse name: Not on file   Number of children: 3   Years of education: Not on file   Highest education level: Not on file  Occupational History   Not on file  Tobacco Use   Smoking status: Every Day    Packs/day: 0.50    Years: 39.00    Pack years: 19.50    Types: Cigarettes    Last attempt to quit: 11/2019    Years since quitting: 1.9   Smokeless tobacco: Never  Vaping Use   Vaping Use: Never used  Substance and Sexual Activity   Alcohol use: No    Alcohol/week: 0.0 standard drinks   Drug use: No   Sexual activity: Yes    Partners: Male  Other Topics Concern   Not on file  Social History Narrative   Regular exercise:  No   Caffeine Use:  2 cups coffee and 3-4 glasses of soda daily.   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   Social Determinants of Health   Financial Resource Strain: Low Risk    Difficulty of Paying Living Expenses: Not hard at all  Food Insecurity: No Food Insecurity   Worried About Charity fundraiser in the Last Year: Never true   [place] in the Last Year: Never true  Transportation Needs: No Transportation Needs   Lack of Transportation (Medical): No   Lack of Transportation (Non-Medical): No  Physical Activity: Inactive   Days of Exercise per Week: 0 days   Minutes of Exercise per Session: 0 min  Stress: No Stress Concern Present   Feeling of Stress : Not at all  Social Connections: Socially Integrated   Frequency of Communication with Friends and Family: More than three times a week   Frequency of Social Gatherings with Friends and Family: More than three times a week   Attends Religious Services: More than 4 times per year   Active Member of [employer] or Organizations: Yes   Attends Music therapist: More than 4 times per year   Marital Status: Married  Human resources officer Violence: Not At Risk   Fear of  Current or Ex-Partner: No   Emotionally Abused: No   Physically Abused: No   Sexually Abused: No    Outpatient Medications Prior to Visit  Medication Sig Dispense Refill   aspirin 81 MG chewable tablet Chew 1 tablet (81 mg total) by mouth daily. 90 tablet 3   atorvastatin (LIPITOR) 80 MG tablet Take 1 tablet (80 mg total) by mouth daily. 90 tablet 1   carvedilol (COREG) 12.5 MG tablet Take 1 tablet (12.5  mg total) by mouth 2 (two) times daily with a meal. 180 tablet 1   fluticasone (FLONASE) 50 MCG/ACT nasal spray USE 2 SPRAYS IN EACH NOSTRIL EVERY DAY 48 g 1   gabapentin (NEURONTIN) 300 MG capsule TAKE 1 CAPSULE BY MOUTH DAILY IN THE MORNING, 1 AT NOON AND 2 AT BEDTIME. 360 capsule 0   isosorbide mononitrate (IMDUR) 30 MG 24 hr tablet Take 1 tablet (30 mg total) by mouth daily. 90 tablet 1   linaclotide (LINZESS) 145 MCG CAPS capsule Take 1 capsule (145 mcg total) by mouth daily before breakfast. 30 capsule 5   losartan (COZAAR) 50 MG tablet Take 1 tablet (50 mg total) by mouth daily. 90 tablet 3   methocarbamol (ROBAXIN) 500 MG tablet Take 1 tablet (500 mg total) by mouth 2 (two) times daily as needed.  2   nitroGLYCERIN (NITROSTAT) 0.4 MG SL tablet Place 1 tablet (0.4 mg total) under the tongue every 5 (five) minutes as needed for chest pain (CP or SOB). 25 tablet 3   oxyCODONE-acetaminophen (PERCOCET) 7.5-325 MG tablet Take 1 tablet by mouth at bedtime as needed. 30 tablet 0   pantoprazole (PROTONIX) 40 MG tablet TAKE 1 TABLET BY MOUTH TWICE A DAY 180 tablet 3   potassium chloride SA (KLOR-CON M20) 20 MEQ tablet TAKE 1 TABLET BY MOUTH TWICE A DAY 180 tablet 0   Sod Picosulfate-Mag Ox-Cit Acd (CLENPIQ) 10-3.5-12 MG-GM -GM/160ML SOLN Take 1 kit by mouth as directed. 320 mL 0   VENTOLIN HFA 108 (90 Base) MCG/ACT inhaler INHALE 2 PUFFS INTO THE LUNGS EVERY 6 HOURS AS NEEDED FOR WHEEZING OR SHORTNESS OF BREATH. 18 g 0   No facility-administered medications prior to visit.    Allergies   Allergen Reactions   Shrimp [Shellfish Allergy] Itching    Rash and lips itching   Ace Inhibitors Cough    Cough   Aspirin Nausea And Vomiting   Azithromycin Rash    Review of Systems  HENT:  Positive for congestion.   Gastrointestinal:  Positive for abdominal pain. Negative for constipation, diarrhea and vomiting.  Genitourinary:  Positive for dysuria.       (+) vaginal itching (+) vaginal burning  (+) vaginal discharge       Objective:    Physical Exam Constitutional:      General: She is not in acute distress.    Appearance: Normal appearance. She is not ill-appearing.  HENT:     Head: Normocephalic and atraumatic.     Right Ear: External ear normal.     Left Ear: External ear normal.  Eyes:     Extraocular Movements: Extraocular movements intact.     Pupils: Pupils are equal, round, and reactive to light.  Cardiovascular:     Rate and Rhythm: Normal rate and regular rhythm.     Heart sounds: Normal heart sounds. No murmur heard.   No gallop.  Pulmonary:     Effort: Pulmonary effort is normal. No respiratory distress.     Breath sounds: Normal breath sounds. No wheezing or rales.  Abdominal:     General: Bowel sounds are normal.     Palpations: Abdomen is soft.     Tenderness: There is abdominal tenderness (left lower quadrant). There is no guarding.  Lymphadenopathy:     Cervical: No cervical adenopathy.  Skin:    General: Skin is warm and dry.  Neurological:     Mental Status: She is alert and oriented to person, place, and time.  Psychiatric:        Behavior: Behavior normal.        Judgment: Judgment normal.    BP 137/63 (BP Location: Right Arm, Patient Position: Sitting, Cuff Size: Small)    Pulse 66    Temp 98.5 F (36.9 C) (Oral)    Resp 16    Wt 197 lb (89.4 kg)    LMP 11/25/1994    SpO2 96%    BMI 31.80 kg/m  Wt Readings from Last 3 Encounters:  11/13/21 197 lb (89.4 kg)  11/09/21 199 lb (90.3 kg)  11/06/21 199 lb (90.3 kg)       Assessment  & Plan:   Problem List Items Addressed This Visit       Unprioritized   Left lower quadrant abdominal pain - Primary    Present x 1 week. UA/culture negative. Recommended CT abd/pelvis to further evaluate. She does have an upcoming colonoscopy scheduled as well.       Relevant Orders   CT Abdomen Pelvis W Contrast   Hypertension   Relevant Orders   Basic metabolic panel   Allergic rhinitis    New. Not improved with zyrtec and flonase. Advised pt to continue these medications, but to add singulair 10 mg once daily.       Other Visit Diagnoses     Vaginal itching       Relevant Orders   Cervicovaginal ancillary only( [place])      Meds ordered this encounter  Medications   fluconazole (DIFLUCAN) 150 MG tablet    Sig: Take 1 tablet by mouth today. May repeat in 3 days as needed.    Dispense:  2 tablet    Refill:  0    Order Specific Question:   Supervising Provider    Answer:   Penni Homans A [[id number]]   montelukast (SINGULAIR) 10 MG tablet    Sig: Take 1 tablet (10 mg total) by mouth at bedtime.    Dispense:  30 tablet    Refill:  3    Order Specific Question:   Supervising Provider    Answer:   Penni Homans A [[id number]]    I, Debbrah Alar, NP, personally preformed the services described in this documentation.  All medical record entries made by the scribe were at my direction and in my presence.  I have reviewed the chart and discharge instructions (if applicable) and agree that the record reflects my personal performance and is accurate and complete. 11/13/2021  I,[name],acting as a Education administrator for Nance Pear, NP.,have documented all relevant documentation on the behalf of Nance Pear, NP,as directed by  Nance Pear, NP while in the presence of Nance Pear, NP.  Nance Pear, NP

## 2021-11-14 ENCOUNTER — Encounter: Payer: Self-pay | Admitting: Family

## 2021-11-14 LAB — CERVICOVAGINAL ANCILLARY ONLY
Bacterial Vaginitis (gardnerella): NEGATIVE
Candida Glabrata: POSITIVE — AB
Candida Vaginitis: NEGATIVE
Comment: NEGATIVE
Comment: NEGATIVE
Comment: NEGATIVE

## 2021-11-14 NOTE — Progress Notes (Signed)
Mailed out to pt 

## 2021-11-16 ENCOUNTER — Ambulatory Visit (HOSPITAL_BASED_OUTPATIENT_CLINIC_OR_DEPARTMENT_OTHER)
Admission: RE | Admit: 2021-11-16 | Discharge: 2021-11-16 | Disposition: A | Discharge status: Home / Self Care | Payer: Medicare Other | Source: Ambulatory Visit | Attending: Family | Admitting: Family

## 2021-11-16 ENCOUNTER — Other Ambulatory Visit: Payer: Self-pay

## 2021-11-16 ENCOUNTER — Encounter (HOSPITAL_BASED_OUTPATIENT_CLINIC_OR_DEPARTMENT_OTHER): Payer: Self-pay

## 2021-11-16 DIAGNOSIS — R1032 Left lower quadrant pain: Secondary | ICD-10-CM | POA: Diagnosis not present

## 2021-11-16 DIAGNOSIS — R109 Unspecified abdominal pain: Secondary | ICD-10-CM | POA: Diagnosis not present

## 2021-11-16 MED ORDER — IOHEXOL 300 MG/ML  SOLN
100.0000 mL | Freq: Once | INTRAMUSCULAR | Status: AC | PRN
  Administered 2021-11-16: 11:00:00 100 mL via INTRAVENOUS

## 2021-11-23 ENCOUNTER — Encounter: Payer: Self-pay | Admitting: Gastroenterology

## 2021-11-23 ENCOUNTER — Ambulatory Visit (AMBULATORY_SURGERY_CENTER): Payer: Medicare Other | Admitting: Gastroenterology

## 2021-11-23 VITALS — BP 156/75 | HR 66 | Temp 98.4°F | Resp 22 | Ht 66.0 in | Wt 199.0 lb

## 2021-11-23 DIAGNOSIS — Z1211 Encounter for screening for malignant neoplasm of colon: Secondary | ICD-10-CM | POA: Diagnosis not present

## 2021-11-23 DIAGNOSIS — I10 Essential (primary) hypertension: Secondary | ICD-10-CM | POA: Diagnosis not present

## 2021-11-23 DIAGNOSIS — D122 Benign neoplasm of ascending colon: Secondary | ICD-10-CM

## 2021-11-23 DIAGNOSIS — K621 Rectal polyp: Secondary | ICD-10-CM | POA: Diagnosis not present

## 2021-11-23 DIAGNOSIS — I2510 Atherosclerotic heart disease of native coronary artery without angina pectoris: Secondary | ICD-10-CM | POA: Diagnosis not present

## 2021-11-23 DIAGNOSIS — D128 Benign neoplasm of rectum: Secondary | ICD-10-CM

## 2021-11-23 MED ORDER — SODIUM CHLORIDE 0.9 % IV SOLN
500.0000 mL | Freq: Once | INTRAVENOUS | Status: DC
Start: 2021-11-23 — End: 2021-11-23

## 2021-11-23 NOTE — Progress Notes (Signed)
Called to room to assist during endoscopic procedure.  Patient ID and intended procedure confirmed with present staff. Received instructions for my participation in the procedure from the performing physician.  

## 2021-11-23 NOTE — Op Note (Signed)
[facility] Patient Name: Jade [name] Procedure Date: 11/23/2021 10:11 AM MRN: [id number] Endoscopist: Gerrit Heck , MD Age: 65 Referring MD:  Date of Birth: 11-13-1956 Gender: Female Account #: [id number] Procedure:                Colonoscopy Indications:              Screening for colorectal malignant neoplasm (last                            colonoscopy was more than 10 years ago) Medicines:                Monitored Anesthesia Care Procedure:                Pre-Anesthesia Assessment:                           - Prior to the procedure, a History and Physical                            was performed, and patient medications and                            allergies were reviewed. The patient's tolerance of                            previous anesthesia was also reviewed. The risks                            and benefits of the procedure and the sedation                            options and risks were discussed with the patient.                            All questions were answered, and informed consent                            was obtained. Prior Anticoagulants: The patient has                            taken no previous anticoagulant or antiplatelet                            agents except for aspirin. ASA Grade Assessment:                            III - A patient with severe systemic disease. After                            reviewing the risks and benefits, the patient was                            deemed in satisfactory condition to undergo the  procedure.                           After obtaining informed consent, the colonoscope                            was passed under direct vision. Throughout the                            procedure, the patient's blood pressure, pulse, and                            oxygen saturations were monitored continuously. The                            Olympus Colonoscope #[id number] was introduced  through                            the anus and advanced to the the cecum, identified                            by appendiceal orifice and ileocecal valve. The                            colonoscopy was performed without difficulty. The                            patient tolerated the procedure well. The quality                            of the bowel preparation was good. The ileocecal                            valve, appendiceal orifice, and rectum were                            photographed. Scope In: 10:21:08 AM Scope Out: 10:45:26 AM Scope Withdrawal Time: 0 hours 18 minutes 47 seconds  Total Procedure Duration: 0 hours 24 minutes 18 seconds  Findings:                 The perianal and digital rectal examinations were                            normal.                           Two sessile polyps were found in the ascending                            colon. The polyps were 3 to 6 mm in size. These                            polyps were removed with a cold snare. Resection  and retrieval were complete. Estimated blood loss                            was minimal.                           Three sessile polyps were found in the rectum. The                            polyps were 1 to 2 mm in size. These polyps were                            removed with a cold biopsy forceps. Resection and                            retrieval were complete. Estimated blood loss was                            minimal.                           There was a small lipoma, in the ascending colon.                           The retroflexed view of the distal rectum and anal                            verge was normal and showed no anal or rectal                            abnormalities. Complications:            No immediate complications. Estimated Blood Loss:     Estimated blood loss: none. Impression:               - Two 3 to 6 mm polyps in the ascending colon,                             removed with a cold snare. Resected and retrieved.                           - Three 1 to 2 mm polyps in the rectum, removed                            with a cold biopsy forceps. Resected and retrieved.                           - Small lipoma in the ascending colon.                           - The distal rectum and anal verge are normal on                            retroflexion view. Recommendation:           -  Patient has a contact number available for                            emergencies. The signs and symptoms of potential                            delayed complications were discussed with the                            patient. Return to normal activities tomorrow.                            Written discharge instructions were provided to the                            patient.                           - Resume previous diet.                           - Continue present medications.                           - Await pathology results.                           - Repeat colonoscopy for surveillance based on                            pathology results.                           - Return to GI clinic PRN. Gerrit Heck, MD 11/23/2021 10:53:09 AM

## 2021-11-23 NOTE — Patient Instructions (Signed)
Resume previous medications.  5 polyps removed and sent to pathology.  Await results for final recommendations.  Handouts on findings given to patient (polyps)     YOU HAD AN ENDOSCOPIC PROCEDURE TODAY AT [facility]:   Refer to the procedure report that was given to you for any specific questions about what was found during the examination.  If the procedure report does not answer your questions, please call your gastroenterologist to clarify.  If you requested that your care partner not be given the details of your procedure findings, then the procedure report has been included in a sealed envelope for you to review at your convenience later.  YOU SHOULD EXPECT: Some feelings of bloating in the abdomen. Passage of more gas than usual.  Walking can help get rid of the air that was put into your GI tract during the procedure and reduce the bloating. If you had a lower endoscopy (such as a colonoscopy or flexible sigmoidoscopy) you may notice spotting of blood in your stool or on the toilet paper. If you underwent a bowel prep for your procedure, you may not have a normal bowel movement for a few days.  Please Note:  You might notice some irritation and congestion in your nose or some drainage.  This is from the oxygen used during your procedure.  There is no need for concern and it should clear up in a day or so.  SYMPTOMS TO REPORT IMMEDIATELY:  Following lower endoscopy (colonoscopy or flexible sigmoidoscopy):  Excessive amounts of blood in the stool  Significant tenderness or worsening of abdominal pains  Swelling of the abdomen that is new, acute  Fever of 100F or higher   For urgent or emergent issues, a gastroenterologist can be reached at any hour by calling [phone]. Do not use MyChart messaging for urgent concerns.    DIET:  We do recommend a small meal at first, but then you may proceed to your regular diet.  Drink plenty of fluids but you should avoid alcoholic  beverages for 24 hours.  ACTIVITY:  You should plan to take it easy for the rest of today and you should NOT DRIVE or use heavy machinery until tomorrow (because of the sedation medicines used during the test).    FOLLOW UP: Our staff will call the number listed on your records 48-72 hours following your procedure to check on you and address any questions or concerns that you may have regarding the information given to you following your procedure. If we do not reach you, we will leave a message.  We will attempt to reach you two times.  During this call, we will ask if you have developed any symptoms of COVID 19. If you develop any symptoms (ie: fever, flu-like symptoms, shortness of breath, cough etc.) before then, please call [phone].  If you test positive for Covid 19 in the 2 weeks post procedure, please call and report this information to [place].    If any biopsies were taken you will be contacted by phone or by letter within the next 1-3 weeks.  Please call us at [phone] if you have not heard about the biopsies in 3 weeks.    SIGNATURES/CONFIDENTIALITY: You and/or your care partner have signed paperwork which will be entered into your electronic medical record.  These signatures attest to the fact that that the information above on your After Visit Summary has been reviewed and is understood.  Full responsibility of the confidentiality  of this discharge information lies with you and/or your care-partner.

## 2021-11-23 NOTE — Progress Notes (Signed)
Report to PACU, RN, vss, BBS= Clear.  

## 2021-11-23 NOTE — Progress Notes (Signed)
Pt's states no medical or surgical changes since previsit or office visit. 

## 2021-11-23 NOTE — Progress Notes (Signed)
GASTROENTEROLOGY PROCEDURE H&P NOTE   Primary Care Physician: Debbrah Alar, NP    Reason for Procedure:  Colon Cancer screening  Plan:    Colonoscopy  Patient is appropriate for endoscopic procedure(s) in the ambulatory ([facility]) setting.  The nature of the procedure, as well as the risks, benefits, and alternatives were carefully and thoroughly reviewed with the patient. Ample time for discussion and questions allowed. The patient understood, was satisfied, and agreed to proceed.     HPI: Jade [name] is a 65 y.o. female who presents for colonoscopy for routine Colon Cancer screening.  No active GI symptoms.  No known family history of colon cancer or related malignancy.  Patient is otherwise without complaints or active issues today.  Past Medical History:  Diagnosis Date   Benign microscopic hematuria    neg cystoscopy 11/12- dr. Janice Norrie   DJD (degenerative joint disease)    L KNEE   Family history of anesthesia complication    multiple family members with history of this   GERD (gastroesophageal reflux disease)    Hurthle cell neoplasm of thyroid    Hyperlipidemia LDL goal <70 10/23/2019   Hypertension    Malignant hyperthermia    NSTEMI (non-ST elevated myocardial infarction) ([place]) 10/22/2019   Numbness and tingling in left arm     Past Surgical History:  Procedure Laterality Date   ABDOMINAL HYSTERECTOMY  1996   BREAST EXCISIONAL BIOPSY Left 01/2016   BREAST EXCISIONAL BIOPSY Left 03/2016   CERVICAL DISC SURGERY  2013   Dr. Arnoldo Morale   COLONOSCOPY  2010   in [place], [place]-normal exam   CORONARY BALLOON ANGIOPLASTY N/A 10/25/2019   Procedure: [place];  Surgeon: Troy Sine, MD;  Location: [facility];  Service: Cardiovascular;  Laterality: N/A;   CORONARY STENT INTERVENTION N/A 10/22/2019   Procedure: CORONARY STENT INTERVENTION;  Surgeon: Troy Sine, MD;  Location: [facility];  Service: Cardiovascular;   Laterality: N/A;  mid lad    CORONARY STENT INTERVENTION N/A 10/25/2019   Procedure: CORONARY STENT INTERVENTION;  Surgeon: Troy Sine, MD;  Location: [facility];  Service: Cardiovascular;  Laterality: N/A;   DILATION AND CURETTAGE OF UTERUS     KNEE CARTILAGE SURGERY  1999   right knee   LEFT HEART CATH AND CORONARY ANGIOGRAPHY N/A 10/22/2019   Procedure: LEFT HEART CATH AND CORONARY ANGIOGRAPHY;  Surgeon: Troy Sine, MD;  Location: [facility];  Service: Cardiovascular;  Laterality: N/A;   ROTATOR CUFF REPAIR Right 05/25/2013   TOTAL KNEE ARTHROPLASTY Left 01/20/2014   Procedure: LEFT TOTAL KNEE ARTHROPLASTY;  Surgeon: Johnn Hai, MD;  Location: [facility];  Service: Orthopedics;  Laterality: Left;   TOTAL KNEE ARTHROPLASTY Right 01/19/2015   Procedure: RIGHT TOTAL KNEE ARTHROPLASTY;  Surgeon: Johnn Hai, MD;  Location: [facility];  Service: Orthopedics;  Laterality: Right;   TUBAL LIGATION      Prior to Admission medications   Medication Sig Start Date End Date Taking? Authorizing Provider  aspirin 81 MG chewable tablet Chew 1 tablet (81 mg total) by mouth daily. 10/27/19  Yes Furth, Cadence H, PA-C  atorvastatin (LIPITOR) 80 MG tablet Take 1 tablet (80 mg total) by mouth daily. 11/15/19  Yes Tobb, Kardie, DO  carvedilol (COREG) 12.5 MG tablet Take 1 tablet (12.5 mg total) by mouth 2 (two) times daily with a meal. 11/15/19  Yes Tobb, Kardie, DO  fluticasone (FLONASE) 50 MCG/ACT nasal spray USE  2 SPRAYS IN EACH NOSTRIL EVERY DAY 01/06/18  Yes Debbrah Alar, NP  gabapentin (NEURONTIN) 300 MG capsule TAKE 1 CAPSULE BY MOUTH DAILY IN THE MORNING, 1 AT NOON AND 2 AT BEDTIME. 11/07/21  Yes Debbrah Alar, NP  isosorbide mononitrate (IMDUR) 30 MG 24 hr tablet Take 1 tablet (30 mg total) by mouth daily. 11/15/19  Yes Tobb, Kardie, DO  linaclotide (LINZESS) 145 MCG CAPS capsule Take 1 capsule (145 mcg total) by mouth daily before breakfast. 10/24/20  Yes  Debbrah Alar, NP  losartan (COZAAR) 50 MG tablet Take 1 tablet (50 mg total) by mouth daily. 04/25/20  Yes Debbrah Alar, NP  methocarbamol (ROBAXIN) 500 MG tablet Take 1 tablet (500 mg total) by mouth 2 (two) times daily as needed. 10/13/19  Yes Debbrah Alar, NP  montelukast (SINGULAIR) 10 MG tablet Take 1 tablet (10 mg total) by mouth at bedtime. 11/13/21  Yes Debbrah Alar, NP  oxyCODONE-acetaminophen (PERCOCET) 7.5-325 MG tablet Take 1 tablet by mouth at bedtime as needed. 10/13/19  Yes Debbrah Alar, NP  pantoprazole (PROTONIX) 40 MG tablet TAKE 1 TABLET BY MOUTH TWICE A DAY 11/07/21  Yes Debbrah Alar, NP  potassium chloride SA (KLOR-CON M20) 20 MEQ tablet TAKE 1 TABLET BY MOUTH TWICE A DAY 11/07/21  Yes Debbrah Alar, NP  nitroGLYCERIN (NITROSTAT) 0.4 MG SL tablet Place 1 tablet (0.4 mg total) under the tongue every 5 (five) minutes as needed for chest pain (CP or SOB). 11/15/19   Tobb, Kardie, DO  VENTOLIN HFA 108 (90 Base) MCG/ACT inhaler INHALE 2 PUFFS INTO THE LUNGS EVERY 6 HOURS AS NEEDED FOR WHEEZING OR SHORTNESS OF BREATH. 12/23/19   Debbrah Alar, NP    Current Outpatient Medications  Medication Sig Dispense Refill   aspirin 81 MG chewable tablet Chew 1 tablet (81 mg total) by mouth daily. 90 tablet 3   atorvastatin (LIPITOR) 80 MG tablet Take 1 tablet (80 mg total) by mouth daily. 90 tablet 1   carvedilol (COREG) 12.5 MG tablet Take 1 tablet (12.5 mg total) by mouth 2 (two) times daily with a meal. 180 tablet 1   fluticasone (FLONASE) 50 MCG/ACT nasal spray USE 2 SPRAYS IN EACH NOSTRIL EVERY DAY 48 g 1   gabapentin (NEURONTIN) 300 MG capsule TAKE 1 CAPSULE BY MOUTH DAILY IN THE MORNING, 1 AT NOON AND 2 AT BEDTIME. 360 capsule 0   isosorbide mononitrate (IMDUR) 30 MG 24 hr tablet Take 1 tablet (30 mg total) by mouth daily. 90 tablet 1   linaclotide (LINZESS) 145 MCG CAPS capsule Take 1 capsule (145 mcg total) by mouth daily before  breakfast. 30 capsule 5   losartan (COZAAR) 50 MG tablet Take 1 tablet (50 mg total) by mouth daily. 90 tablet 3   methocarbamol (ROBAXIN) 500 MG tablet Take 1 tablet (500 mg total) by mouth 2 (two) times daily as needed.  2   montelukast (SINGULAIR) 10 MG tablet Take 1 tablet (10 mg total) by mouth at bedtime. 30 tablet 3   oxyCODONE-acetaminophen (PERCOCET) 7.5-325 MG tablet Take 1 tablet by mouth at bedtime as needed. 30 tablet 0   pantoprazole (PROTONIX) 40 MG tablet TAKE 1 TABLET BY MOUTH TWICE A DAY 180 tablet 3   potassium chloride SA (KLOR-CON M20) 20 MEQ tablet TAKE 1 TABLET BY MOUTH TWICE A DAY 180 tablet 0   nitroGLYCERIN (NITROSTAT) 0.4 MG SL tablet Place 1 tablet (0.4 mg total) under the tongue every 5 (five) minutes as needed for chest pain (CP or  SOB). 25 tablet 3   VENTOLIN HFA 108 (90 Base) MCG/ACT inhaler INHALE 2 PUFFS INTO THE LUNGS EVERY 6 HOURS AS NEEDED FOR WHEEZING OR SHORTNESS OF BREATH. 18 g 0   Current Facility-Administered Medications  Medication Dose Route Frequency Provider Last Rate Last Admin   0.9 %  sodium chloride infusion  500 mL Intravenous Once Tabitha Tupper V, DO        Allergies as of 11/23/2021 - Review Complete 11/23/2021  Allergen Reaction Noted   Shrimp [shellfish allergy] Itching 01/19/2015   Ace inhibitors Cough 03/04/2012   Aspirin Nausea And Vomiting 06/14/2011   Azithromycin Rash 07/15/2011    Family History  Problem Relation Age of Onset   Hypertension Mother    Hyperlipidemia Mother    Heart disease Father        CAD; stent at age 10   Diabetes Father    Hypertension Father    Hyperlipidemia Father    Cancer Father        prostate   Dementia Father    Cancer Paternal Aunt        BREAST   Breast cancer Paternal Aunt    Colon cancer Neg Hx     Social History   Socioeconomic History   Marital status: Married    Spouse name: Not on file   Number of children: 3   Years of education: Not on file   Highest education level:  Not on file  Occupational History   Not on file  Tobacco Use   Smoking status: Every Day    Packs/day: 0.50    Years: 39.00    Pack years: 19.50    Types: Cigarettes    Last attempt to quit: 11/2019    Years since quitting: 1.9   Smokeless tobacco: Never  Vaping Use   Vaping Use: Never used  Substance and Sexual Activity   Alcohol use: No    Alcohol/week: 0.0 standard drinks   Drug use: No   Sexual activity: Yes    Partners: Male  Other Topics Concern   Not on file  Social History Narrative   Regular exercise:  No   Caffeine Use:  2 cups coffee and 3-4 glasses of soda daily.   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   Social Determinants of Health   Financial Resource Strain: Low Risk    Difficulty of Paying Living Expenses: Not hard at all  Food Insecurity: No Food Insecurity   Worried About Charity fundraiser in the Last Year: Never true   [place] in the Last Year: Never true  Transportation Needs: No Transportation Needs   Lack of Transportation (Medical): No   Lack of Transportation (Non-Medical): No  Physical Activity: Inactive   Days of Exercise per Week: 0 days   Minutes of Exercise per Session: 0 min  Stress: No Stress Concern Present   Feeling of Stress : Not at all  Social Connections: Socially Integrated   Frequency of Communication with Friends and Family: More than three times a week   Frequency of Social Gatherings with Friends and Family: More than three times a week   Attends Religious Services: More than 4 times per year   Active Member of [employer] or Organizations: Yes   Attends Music therapist: More than 4 times per year   Marital Status: Married  Human resources officer Violence: Not At Risk  Fear of Current or Ex-Partner: No   Emotionally Abused: No   Physically Abused: No   Sexually Abused: No    Physical Exam: Vital signs in last 24 hours: @BP  131/69    Pulse 67     Temp 98.4 F (36.9 C) (Temporal)    Ht 5\' 6"  (1.676 m)    Wt 199 lb (90.3 kg)    LMP 11/25/1994    SpO2 99%    BMI 32.12 kg/m  GEN: NAD EYE: Sclerae anicteric ENT: MMM CV: Non-tachycardic Pulm: CTA b/l GI: Soft, NT/ND NEURO:  Alert & Oriented x 3   Gerrit Heck, DO [facility]   11/23/2021 10:14 AM

## 2021-11-28 ENCOUNTER — Telehealth: Payer: Self-pay | Admitting: *Deleted

## 2021-11-28 NOTE — Telephone Encounter (Signed)
Attempted 2nd f/u phone call. No answer. Left message.  °

## 2021-11-28 NOTE — Telephone Encounter (Signed)
Left message on f/u call 

## 2021-12-06 ENCOUNTER — Encounter: Payer: Self-pay | Admitting: Gastroenterology

## 2021-12-09 ENCOUNTER — Other Ambulatory Visit: Payer: Self-pay | Admitting: Family

## 2021-12-11 DIAGNOSIS — G894 Chronic pain syndrome: Secondary | ICD-10-CM | POA: Diagnosis not present

## 2021-12-17 ENCOUNTER — Ambulatory Visit: Payer: Medicare Other | Admitting: Family

## 2022-01-17 ENCOUNTER — Telehealth: Payer: Self-pay

## 2022-01-17 NOTE — Telephone Encounter (Signed)
Caller Name [facility] Phone Number [phone] Patient Name Jade [name] Patient DOB 1956-05-11 Call Type Message Only Information Provided Reason for Call Request to Schedule Office Appointment Initial Comment Caller states she needs to schedule an appointment. Patient request to speak to RN No Additional Comment Caller declined triage. Office hours provided. Disp. Time Disposition Final User 01/16/2022 5:09:18 PM General Information Provided Yes Wilford Sports Call Closed By: Wilford Sports Transaction Date/Time: 01/16/2022 5:04:00 PM (ET)

## 2022-01-17 NOTE — Telephone Encounter (Signed)
Patient scheduled to see Lovena Le tomorrw

## 2022-01-18 ENCOUNTER — Other Ambulatory Visit (HOSPITAL_BASED_OUTPATIENT_CLINIC_OR_DEPARTMENT_OTHER): Payer: Self-pay

## 2022-01-18 ENCOUNTER — Ambulatory Visit (INDEPENDENT_AMBULATORY_CARE_PROVIDER_SITE_OTHER): Payer: Medicare Other | Admitting: Family Medicine

## 2022-01-18 ENCOUNTER — Encounter: Payer: Self-pay | Admitting: Family Medicine

## 2022-01-18 ENCOUNTER — Ambulatory Visit: Payer: Medicare Other | Admitting: Medical

## 2022-01-18 VITALS — BP 143/66 | HR 73 | Temp 98.2°F | Ht 66.0 in | Wt 193.8 lb

## 2022-01-18 DIAGNOSIS — J0140 Acute pansinusitis, unspecified: Secondary | ICD-10-CM | POA: Diagnosis not present

## 2022-01-18 MED ORDER — AMOXICILLIN-POT CLAVULANATE 875-125 MG PO TABS
1.0000 | ORAL_TABLET | Freq: Two times a day (BID) | ORAL | 0 refills | Status: AC
  Filled 2022-01-18: qty 20, 10d supply, fill #0

## 2022-01-18 NOTE — Progress Notes (Signed)
Acute Office Visit  Subjective:    Patient ID: Jade [name], female    DOB: [birth year], 66 y.o.   MRN: [id number]  CC: URI/sinusitis   HPI Patient is in today for URI/sinusitis symptoms.  Patient reports she has had cold/allergy symptoms for the past several weeks, but reports symptoms have started to worsen this week. She has been taking Zyrtec, Singulair, and Flonase nasal spray. Symptoms include sinus pressure, headaches, nasal congestion, rhinorrhea, sneezing, coughing, bilateral ear pressure, dry cough, fatigue, occasional shortness of breath and wheezing but has not needed to use albuterol inhaler. She had a negative COVID test 3 days ago. She denies fevers, chest pain, n/v/d, loss of taste/smell.      Past Medical History:  Diagnosis Date   Benign microscopic hematuria    neg cystoscopy 11/12- dr. Janice Norrie   DJD (degenerative joint disease)    L KNEE   Family history of anesthesia complication    multiple family members with history of this   GERD (gastroesophageal reflux disease)    Hurthle cell neoplasm of thyroid    Hyperlipidemia LDL goal <70 10/23/2019   Hypertension    Malignant hyperthermia    NSTEMI (non-ST elevated myocardial infarction) ([place]) 10/22/2019   Numbness and tingling in left arm     Past Surgical History:  Procedure Laterality Date   ABDOMINAL HYSTERECTOMY  1996   BREAST EXCISIONAL BIOPSY Left 01/2016   BREAST EXCISIONAL BIOPSY Left 03/2016   CERVICAL DISC SURGERY  2013   Dr. Arnoldo Morale   COLONOSCOPY  2010   in [place], -normal exam   CORONARY BALLOON ANGIOPLASTY N/A 10/25/2019   Procedure: [place];  Surgeon: Troy Sine, MD;  Location: [facility];  Service: Cardiovascular;  Laterality: N/A;   CORONARY STENT INTERVENTION N/A 10/22/2019   Procedure: CORONARY STENT INTERVENTION;  Surgeon: Troy Sine, MD;  Location: [facility];  Service: Cardiovascular;  Laterality: N/A;  mid lad    CORONARY  STENT INTERVENTION N/A 10/25/2019   Procedure: CORONARY STENT INTERVENTION;  Surgeon: Troy Sine, MD;  Location: [facility];  Service: Cardiovascular;  Laterality: N/A;   DILATION AND CURETTAGE OF UTERUS     KNEE CARTILAGE SURGERY  1999   right knee   LEFT HEART CATH AND CORONARY ANGIOGRAPHY N/A 10/22/2019   Procedure: LEFT HEART CATH AND CORONARY ANGIOGRAPHY;  Surgeon: Troy Sine, MD;  Location: [facility];  Service: Cardiovascular;  Laterality: N/A;   ROTATOR CUFF REPAIR Right 05/25/2013   TOTAL KNEE ARTHROPLASTY Left 01/20/2014   Procedure: LEFT TOTAL KNEE ARTHROPLASTY;  Surgeon: Johnn Hai, MD;  Location: [facility];  Service: Orthopedics;  Laterality: Left;   TOTAL KNEE ARTHROPLASTY Right 01/19/2015   Procedure: RIGHT TOTAL KNEE ARTHROPLASTY;  Surgeon: Johnn Hai, MD;  Location: [facility];  Service: Orthopedics;  Laterality: Right;   TUBAL LIGATION      Family History  Problem Relation Age of Onset   Hypertension Mother    Hyperlipidemia Mother    Heart disease Father        CAD; stent at age 38   Diabetes Father    Hypertension Father    Hyperlipidemia Father    Cancer Father        prostate   Dementia Father    Cancer Paternal Aunt        BREAST   Breast cancer Paternal Aunt    Colon cancer Neg Hx  Social History   Socioeconomic History   Marital status: Married    Spouse name: Not on file   Number of children: 3   Years of education: Not on file   Highest education level: Not on file  Occupational History   Not on file  Tobacco Use   Smoking status: Every Day    Packs/day: 0.50    Years: 39.00    Pack years: 19.50    Types: Cigarettes    Last attempt to quit: 11/2019    Years since quitting: 2.1   Smokeless tobacco: Never  Vaping Use   Vaping Use: Never used  Substance and Sexual Activity   Alcohol use: No    Alcohol/week: 0.0 standard drinks   Drug use: No   Sexual activity: Yes    Partners: Male  Other Topics  Concern   Not on file  Social History Narrative   Regular exercise:  No   Caffeine Use:  2 cups coffee and 3-4 glasses of soda daily.   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   Social Determinants of Health   Financial Resource Strain: Low Risk    Difficulty of Paying Living Expenses: Not hard at all  Food Insecurity: No Food Insecurity   Worried About Charity fundraiser in the Last Year: Never true   [place] in the Last Year: Never true  Transportation Needs: No Transportation Needs   Lack of Transportation (Medical): No   Lack of Transportation (Non-Medical): No  Physical Activity: Inactive   Days of Exercise per Week: 0 days   Minutes of Exercise per Session: 0 min  Stress: No Stress Concern Present   Feeling of Stress : Not at all  Social Connections: Socially Integrated   Frequency of Communication with Friends and Family: More than three times a week   Frequency of Social Gatherings with Friends and Family: More than three times a week   Attends Religious Services: More than 4 times per year   Active Member of [employer] or Organizations: Yes   Attends Music therapist: More than 4 times per year   Marital Status: Married  Human resources officer Violence: Not At Risk   Fear of Current or Ex-Partner: No   Emotionally Abused: No   Physically Abused: No   Sexually Abused: No    Outpatient Medications Prior to Visit  Medication Sig Dispense Refill   aspirin 81 MG chewable tablet Chew 81 mg by mouth daily.     atorvastatin (LIPITOR) 80 MG tablet Take 1 tablet (80 mg total) by mouth daily. 90 tablet 1   carvedilol (COREG) 12.5 MG tablet Take 1 tablet (12.5 mg total) by mouth 2 (two) times daily with a meal. 180 tablet 1   fluticasone (FLONASE) 50 MCG/ACT nasal spray USE 2 SPRAYS IN EACH NOSTRIL EVERY DAY 48 g 1   gabapentin (NEURONTIN) 300 MG capsule TAKE 1 CAPSULE BY MOUTH DAILY IN THE MORNING, 1 AT  NOON AND 2 AT BEDTIME. 360 capsule 0   isosorbide mononitrate (IMDUR) 30 MG 24 hr tablet Take 1 tablet (30 mg total) by mouth daily. 90 tablet 1   linaclotide (LINZESS) 145 MCG CAPS capsule Take 1 capsule (145 mcg total) by mouth daily before breakfast. 30 capsule 5   losartan (COZAAR) 50 MG tablet Take 1 tablet (50 mg total) by mouth daily. 90 tablet 3   methocarbamol (ROBAXIN) 500  MG tablet Take 1 tablet (500 mg total) by mouth 2 (two) times daily as needed.  2   montelukast (SINGULAIR) 10 MG tablet Take 1 tablet (10 mg total) by mouth at bedtime. 30 tablet 3   nitroGLYCERIN (NITROSTAT) 0.4 MG SL tablet Place 1 tablet (0.4 mg total) under the tongue every 5 (five) minutes as needed for chest pain (CP or SOB). 25 tablet 3   oxyCODONE-acetaminophen (PERCOCET) 7.5-325 MG tablet Take 1 tablet by mouth at bedtime as needed. 30 tablet 0   pantoprazole (PROTONIX) 40 MG tablet TAKE 1 TABLET BY MOUTH TWICE A DAY 180 tablet 3   potassium chloride SA (KLOR-CON M20) 20 MEQ tablet TAKE 1 TABLET BY MOUTH TWICE A DAY 180 tablet 0   VENTOLIN HFA 108 (90 Base) MCG/ACT inhaler INHALE 2 PUFFS INTO THE LUNGS EVERY 6 HOURS AS NEEDED FOR WHEEZING OR SHORTNESS OF BREATH. 18 g 0   No facility-administered medications prior to visit.    Allergies  Allergen Reactions   Shrimp [Shellfish Allergy] Itching    Rash and lips itching   Ace Inhibitors Cough    Cough   Aspirin Nausea And Vomiting   Azithromycin Rash    Review of Systems All review of systems negative except what is listed in the HPI     Objective:    Physical Exam Vitals reviewed.  Constitutional:      Appearance: Normal appearance. She is obese.  HENT:     Head: Normocephalic and atraumatic.     Comments: Frontal and maxillary sinuses tender to palpation    Right Ear: Tympanic membrane normal.     Left Ear: Tympanic membrane normal.     Nose: Congestion present.     Mouth/Throat:     Mouth: Mucous membranes are moist.     Pharynx:  Oropharynx is clear. No oropharyngeal exudate or posterior oropharyngeal erythema.  Eyes:     Extraocular Movements: Extraocular movements intact.     Conjunctiva/sclera: Conjunctivae normal.  Cardiovascular:     Rate and Rhythm: Normal rate and regular rhythm.  Pulmonary:     Effort: Pulmonary effort is normal.     Breath sounds: Normal breath sounds.  Musculoskeletal:     Cervical back: Normal range of motion and neck supple. No tenderness.  Lymphadenopathy:     Cervical: No cervical adenopathy.  Skin:    General: Skin is warm and dry.  Neurological:     General: No focal deficit present.     Mental Status: She is alert and oriented to person, place, and time. Mental status is at baseline.  Psychiatric:        Mood and Affect: Mood normal.        Behavior: Behavior normal.        Thought Content: Thought content normal.        Judgment: Judgment normal.    BP (!) 143/66    Pulse 73    Temp 98.2 F (36.8 C)    Ht 5\' 6"  (1.676 m)    Wt 193 lb 12.8 oz (87.9 kg)    LMP 11/25/1994    SpO2 100%    BMI 31.28 kg/m  Wt Readings from Last 3 Encounters:  01/18/22 193 lb 12.8 oz (87.9 kg)  11/23/21 199 lb (90.3 kg)  11/13/21 197 lb (89.4 kg)    Health Maintenance Due  Topic Date Due   Zoster Vaccines- Shingrix (1 of 2) Never done   COVID-19 Vaccine (4 - Booster for [employer]  series) 11/17/2020   TETANUS/TDAP  07/14/2021    There are no preventive care reminders to display for this patient.   Lab Results  Component Value Date   TSH 1.67 03/19/2021   Lab Results  Component Value Date   WBC 7.6 03/19/2021   HGB 13.1 03/19/2021   HCT 39.8 03/19/2021   MCV 82.3 03/19/2021   PLT 289.0 03/19/2021   Lab Results  Component Value Date   NA 137 11/13/2021   K 4.1 11/13/2021   CO2 26 11/13/2021   GLUCOSE 81 11/13/2021   BUN 16 11/13/2021   CREATININE 0.96 11/13/2021   BILITOT 0.5 03/28/2021   ALKPHOS 114 03/28/2021   AST 10 03/28/2021   ALT 10 03/28/2021   PROT 6.7  03/28/2021   ALBUMIN 4.2 03/28/2021   CALCIUM 9.3 11/13/2021   ANIONGAP 16 (H) 10/26/2019   GFR 61.32 11/13/2021   Lab Results  Component Value Date   CHOL 131 03/19/2021   Lab Results  Component Value Date   HDL 40.20 03/19/2021   Lab Results  Component Value Date   LDLCALC 71 03/19/2021   Lab Results  Component Value Date   TRIG 102.0 03/19/2021   Lab Results  Component Value Date   CHOLHDL 3 03/19/2021   Lab Results  Component Value Date   HGBA1C 6.7 (H) 06/13/2021       Assessment & Plan:   1. Acute non-recurrent pansinusitis Given duration and severity, starting Augmentin. Continue Zyrtec, Singulair, Flonase. Add Mucinex. Continue supportive measures including rest, hydration, humidifier use, steam showers, warm compresses to sinuses, warm liquids with lemon and honey, and over-the-counter cough, cold, and analgesics as needed.  Patient aware of signs/symptoms requiring further/urgent evaluation.    - amoxicillin-clavulanate (AUGMENTIN) 875-125 MG tablet; Take 1 tablet by mouth 2 (two) times daily for 10 days.  Dispense: 20 tablet; Refill: 0    Please contact office for follow-up if symptoms do not improve or worsen. Seek emergency care if symptoms become severe.   Terrilyn Saver, NP

## 2022-01-28 DIAGNOSIS — Z96651 Presence of right artificial knee joint: Secondary | ICD-10-CM | POA: Diagnosis not present

## 2022-02-11 DIAGNOSIS — M25561 Pain in right knee: Secondary | ICD-10-CM | POA: Diagnosis not present

## 2022-02-11 DIAGNOSIS — M5459 Other low back pain: Secondary | ICD-10-CM | POA: Diagnosis not present

## 2022-02-11 DIAGNOSIS — M5451 Vertebrogenic low back pain: Secondary | ICD-10-CM | POA: Diagnosis not present

## 2022-02-25 DIAGNOSIS — M5416 Radiculopathy, lumbar region: Secondary | ICD-10-CM | POA: Diagnosis not present

## 2022-03-04 DIAGNOSIS — M25551 Pain in right hip: Secondary | ICD-10-CM | POA: Diagnosis not present

## 2022-03-04 DIAGNOSIS — M5459 Other low back pain: Secondary | ICD-10-CM | POA: Diagnosis not present

## 2022-03-04 DIAGNOSIS — M5136 Other intervertebral disc degeneration, lumbar region: Secondary | ICD-10-CM | POA: Diagnosis not present

## 2022-03-04 DIAGNOSIS — M5451 Vertebrogenic low back pain: Secondary | ICD-10-CM | POA: Diagnosis not present

## 2022-03-04 DIAGNOSIS — M25561 Pain in right knee: Secondary | ICD-10-CM | POA: Diagnosis not present

## 2022-03-12 ENCOUNTER — Other Ambulatory Visit (HOSPITAL_BASED_OUTPATIENT_CLINIC_OR_DEPARTMENT_OTHER): Payer: Self-pay

## 2022-03-20 DIAGNOSIS — M5459 Other low back pain: Secondary | ICD-10-CM | POA: Diagnosis not present

## 2022-03-20 DIAGNOSIS — Z79891 Long term (current) use of opiate analgesic: Secondary | ICD-10-CM | POA: Diagnosis not present

## 2022-03-20 DIAGNOSIS — G894 Chronic pain syndrome: Secondary | ICD-10-CM | POA: Diagnosis not present

## 2022-03-20 DIAGNOSIS — M5416 Radiculopathy, lumbar region: Secondary | ICD-10-CM | POA: Diagnosis not present

## 2022-03-28 DIAGNOSIS — M5416 Radiculopathy, lumbar region: Secondary | ICD-10-CM | POA: Diagnosis not present

## 2022-04-08 DIAGNOSIS — M1611 Unilateral primary osteoarthritis, right hip: Secondary | ICD-10-CM | POA: Diagnosis not present

## 2022-04-24 DIAGNOSIS — M25551 Pain in right hip: Secondary | ICD-10-CM | POA: Diagnosis not present

## 2022-04-25 ENCOUNTER — Other Ambulatory Visit: Payer: Self-pay | Admitting: Family

## 2022-05-20 ENCOUNTER — Ambulatory Visit (INDEPENDENT_AMBULATORY_CARE_PROVIDER_SITE_OTHER): Payer: Medicare Other | Admitting: Family

## 2022-05-20 ENCOUNTER — Encounter: Payer: Self-pay | Admitting: Family

## 2022-05-20 VITALS — BP 174/73 | HR 71 | Temp 98.5°F | Resp 16 | Ht 66.0 in | Wt 188.8 lb

## 2022-05-20 DIAGNOSIS — Z0000 Encounter for general adult medical examination without abnormal findings: Secondary | ICD-10-CM | POA: Diagnosis not present

## 2022-05-20 DIAGNOSIS — E785 Hyperlipidemia, unspecified: Secondary | ICD-10-CM

## 2022-05-20 DIAGNOSIS — R7303 Prediabetes: Secondary | ICD-10-CM

## 2022-05-20 DIAGNOSIS — Z1231 Encounter for screening mammogram for malignant neoplasm of breast: Secondary | ICD-10-CM

## 2022-05-20 DIAGNOSIS — Z720 Tobacco use: Secondary | ICD-10-CM

## 2022-05-20 DIAGNOSIS — I10 Essential (primary) hypertension: Secondary | ICD-10-CM

## 2022-05-20 DIAGNOSIS — M1611 Unilateral primary osteoarthritis, right hip: Secondary | ICD-10-CM | POA: Diagnosis not present

## 2022-05-20 DIAGNOSIS — R634 Abnormal weight loss: Secondary | ICD-10-CM | POA: Insufficient documentation

## 2022-05-20 LAB — COMPREHENSIVE METABOLIC PANEL
ALT: 10 U/L (ref 0–35)
AST: 9 U/L (ref 0–37)
Albumin: 4 g/dL (ref 3.5–5.2)
Alkaline Phosphatase: 98 U/L (ref 39–117)
BUN: 7 mg/dL (ref 6–23)
CO2: 27 mEq/L (ref 19–32)
Calcium: 9.2 mg/dL (ref 8.4–10.5)
Chloride: 107 mEq/L (ref 96–112)
Creatinine, Ser: 0.66 mg/dL (ref 0.40–1.20)
GFR: 90.54 mL/min (ref >60.00)
Glucose, Bld: 94 mg/dL (ref 70–99)
Potassium: 4.1 mEq/L (ref 3.5–5.1)
Sodium: 142 mEq/L (ref 135–145)
Total Bilirubin: 0.4 mg/dL (ref 0.2–1.2)
Total Protein: 6.6 g/dL (ref 6.0–8.3)

## 2022-05-20 LAB — HEMOGLOBIN A1C: Hgb A1c MFr Bld: 6.6 % — ABNORMAL HIGH (ref 4.6–6.5)

## 2022-05-20 LAB — LIPID PANEL
Cholesterol: 142 mg/dL (ref 0–200)
HDL: 44.9 mg/dL (ref >39.00)
LDL Cholesterol: 78 mg/dL (ref 0–99)
NonHDL: 96.76
Total CHOL/HDL Ratio: 3
Triglycerides: 93 mg/dL (ref 0.0–149.0)
VLDL: 18.6 mg/dL (ref 0.0–40.0)

## 2022-05-20 LAB — TSH: TSH: 1.14 u[IU]/mL (ref 0.35–5.50)

## 2022-05-20 MED ORDER — LOSARTAN POTASSIUM 100 MG PO TABS: 100.0000 mg | ORAL_TABLET | Freq: Every day | ORAL | 0 refills | Status: DC

## 2022-05-20 NOTE — Assessment & Plan Note (Signed)
>>  ASSESSMENT AND PLAN FOR DYSLIPIDEMIA WRITTEN ON 05/20/2022 11:45 AM BY O'SULLIVAN, Cane Dubray, NP  Continues lipitor  80mg . Will obtain follow up lipid panel.

## 2022-05-20 NOTE — Assessment & Plan Note (Signed)
Continues lipitor 80mg . Will obtain follow up lipid panel.

## 2022-05-20 NOTE — Assessment & Plan Note (Signed)
Counseled on the importance of cessation.  

## 2022-05-21 ENCOUNTER — Encounter: Payer: Self-pay | Admitting: Family

## 2022-05-27 ENCOUNTER — Ambulatory Visit (HOSPITAL_BASED_OUTPATIENT_CLINIC_OR_DEPARTMENT_OTHER)
Admission: RE | Admit: 2022-05-27 | Discharge: 2022-05-27 | Disposition: A | Discharge status: Home / Self Care | Payer: Medicare Other | Source: Ambulatory Visit | Attending: Family | Admitting: Family

## 2022-05-27 ENCOUNTER — Encounter (HOSPITAL_BASED_OUTPATIENT_CLINIC_OR_DEPARTMENT_OTHER): Payer: Self-pay

## 2022-05-27 DIAGNOSIS — Z1231 Encounter for screening mammogram for malignant neoplasm of breast: Secondary | ICD-10-CM | POA: Insufficient documentation

## 2022-05-29 ENCOUNTER — Other Ambulatory Visit (HOSPITAL_BASED_OUTPATIENT_CLINIC_OR_DEPARTMENT_OTHER): Payer: Self-pay

## 2022-05-29 ENCOUNTER — Ambulatory Visit (INDEPENDENT_AMBULATORY_CARE_PROVIDER_SITE_OTHER): Payer: Medicare Other | Admitting: Family

## 2022-05-29 DIAGNOSIS — I10 Essential (primary) hypertension: Secondary | ICD-10-CM

## 2022-05-29 LAB — BASIC METABOLIC PANEL
BUN: 12 mg/dL (ref 6–23)
CO2: 23 mEq/L (ref 19–32)
Calcium: 8.9 mg/dL (ref 8.4–10.5)
Chloride: 110 mEq/L (ref 96–112)
Creatinine, Ser: 0.9 mg/dL (ref 0.40–1.20)
GFR: 66.01 mL/min (ref >60.00)
Glucose, Bld: 113 mg/dL — ABNORMAL HIGH (ref 70–99)
Potassium: 4.1 mEq/L (ref 3.5–5.1)
Sodium: 144 mEq/L (ref 135–145)

## 2022-05-29 MED ORDER — LOSARTAN POTASSIUM 100 MG PO TABS
100.0000 mg | ORAL_TABLET | Freq: Every day | ORAL | 0 refills | Status: DC
  Filled 2022-05-29: qty 90, 90d supply, fill #0

## 2022-05-29 NOTE — Progress Notes (Signed)
Subjective:   By signing my name below, I, Shehryar Baig, attest that this documentation has been prepared under the direction and in the presence of Debbrah Alar, NP 05/29/2022   Patient ID: Jade [name], female    DOB: 12/06/1955, 66 y.o.   MRN: [id number]  Chief Complaint  Patient presents with   Hypertension    Here for follow up    HPI Patient is in today for a follow up visit.  Blood pressure- Her losartan dosage was increased during last visit and she reports no new issues since increasing it. Her blood pressure at home is measuring normal. Her blood pressure is doing well during this visit. She continues taking 12.5 mg carvedilol daily PO and reports no new issues while taking it.  BP Readings from Last 3 Encounters:  05/29/22 132/73  05/20/22 (!) 174/73  01/18/22 (!) 143/66   Pulse Readings from Last 3 Encounters:  05/29/22 80  05/20/22 71  01/18/22 73    Health Maintenance Due  Topic Date Due   COVID-19 Vaccine (4 - Booster for Pfizer series) 11/17/2020   TETANUS/TDAP  07/14/2021    Past Medical History:  Diagnosis Date   Benign microscopic hematuria    neg cystoscopy 11/12- dr. Janice Norrie   DJD (degenerative joint disease)    L KNEE   Family history of anesthesia complication    multiple family members with history of this   GERD (gastroesophageal reflux disease)    Hurthle cell neoplasm of thyroid    Hyperlipidemia LDL goal <70 10/23/2019   Hypertension    Malignant hyperthermia    NSTEMI (non-ST elevated myocardial infarction) ([place]) 10/22/2019   Numbness and tingling in left arm     Past Surgical History:  Procedure Laterality Date   ABDOMINAL HYSTERECTOMY  1996   BREAST EXCISIONAL BIOPSY Left 01/2016   BREAST EXCISIONAL BIOPSY Left 03/2016   CERVICAL DISC SURGERY  2013   Dr. Arnoldo Morale   COLONOSCOPY  2010   [place], [place] exam   CORONARY BALLOON ANGIOPLASTY N/A 10/25/2019   Procedure: [place];  Surgeon:  Troy Sine, MD;  Location: [facility];  Service: Cardiovascular;  Laterality: N/A;   CORONARY STENT INTERVENTION N/A 10/22/2019   Procedure: CORONARY STENT INTERVENTION;  Surgeon: Troy Sine, MD;  Location: [facility];  Service: Cardiovascular;  Laterality: N/A;  mid lad    CORONARY STENT INTERVENTION N/A 10/25/2019   Procedure: CORONARY STENT INTERVENTION;  Surgeon: Troy Sine, MD;  Location: [facility];  Service: Cardiovascular;  Laterality: N/A;   DILATION AND CURETTAGE OF UTERUS     KNEE CARTILAGE SURGERY  1999   right knee   LEFT HEART CATH AND CORONARY ANGIOGRAPHY N/A 10/22/2019   Procedure: LEFT HEART CATH AND CORONARY ANGIOGRAPHY;  Surgeon: Troy Sine, MD;  Location: [facility];  Service: Cardiovascular;  Laterality: N/A;   ROTATOR CUFF REPAIR Right 05/25/2013   TOTAL KNEE ARTHROPLASTY Left 01/20/2014   Procedure: LEFT TOTAL KNEE ARTHROPLASTY;  Surgeon: Johnn Hai, MD;  Location: [facility];  Service: Orthopedics;  Laterality: Left;   TOTAL KNEE ARTHROPLASTY Right 01/19/2015   Procedure: RIGHT TOTAL KNEE ARTHROPLASTY;  Surgeon: Johnn Hai, MD;  Location: [facility];  Service: Orthopedics;  Laterality: Right;   TUBAL LIGATION      Family History  Problem Relation Age of Onset   Hypertension Mother    Hyperlipidemia Mother    Heart disease Father  CAD; stent at age 28   Diabetes Father    Hypertension Father    Hyperlipidemia Father    Cancer Father        prostate   Dementia Father    Osteoarthritis Sister    Hypertension Sister    COPD Sister    CAD Sister        s/p stent   Edema Sister        cause unknown   Osteoporosis Sister        bad hips, bed bound, has one leg amputated   Drug abuse Sister    Dementia Paternal Grandmother    Cancer Paternal Aunt        BREAST   Breast cancer Paternal Aunt    Colon cancer Neg Hx     Social History   Socioeconomic History   Marital status: Married    Spouse  name: Not on file   Number of children: 3   Years of education: Not on file   Highest education level: Not on file  Occupational History   Not on file  Tobacco Use   Smoking status: Every Day    Packs/day: 0.50    Years: 39.00    Total pack years: 19.50    Types: Cigarettes    Last attempt to quit: 11/2019    Years since quitting: 2.5   Smokeless tobacco: Never  Vaping Use   Vaping Use: Never used  Substance and Sexual Activity   Alcohol use: No    Alcohol/week: 0.0 standard drinks of alcohol   Drug use: No   Sexual activity: Yes    Partners: Male  Other Topics Concern   Not on file  Social History Narrative   Regular exercise:  No   Caffeine Use:  2 cups coffee and 3-4 glasses of soda daily.   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   Social Determinants of Health   Financial Resource Strain: Low Risk  (09/06/2021)   Overall Financial Resource Strain (CARDIA)    Difficulty of Paying Living Expenses: Not hard at all  Food Insecurity: No Food Insecurity (09/06/2021)   Hunger Vital Sign    Worried About Running Out of Food in the Last Year: Never true    Ran Out of Food in the Last Year: Never true  Transportation Needs: No Transportation Needs (09/06/2021)   PRAPARE - Hydrologist (Medical): No    Lack of Transportation (Non-Medical): No  Physical Activity: Inactive (09/06/2021)   Exercise Vital Sign    Days of Exercise per Week: 0 days    Minutes of Exercise per Session: 0 min  Stress: No Stress Concern Present (09/06/2021)   [place]    Feeling of Stress : Not at all  Social Connections: [place] (09/06/2021)   Social Connection and Isolation Panel [NHANES]    Frequency of Communication with Friends and Family: More than three times a week    Frequency of Social Gatherings with Friends and Family:  More than three times a week    Attends Religious Services: More than 4 times per year    Active Member of [employer] or Organizations: Yes    Attends Archivist Meetings: More than 4 times per year    Marital Status: Married  Human resources officer Violence: Not At Risk (09/06/2021)   Humiliation, Afraid,  Rape, and Kick questionnaire    Fear of Current or Ex-Partner: No    Emotionally Abused: No    Physically Abused: No    Sexually Abused: No    Outpatient Medications Prior to Visit  Medication Sig Dispense Refill   aspirin 81 MG chewable tablet Chew 81 mg by mouth daily.     atorvastatin (LIPITOR) 80 MG tablet Take 1 tablet (80 mg total) by mouth daily. 90 tablet 1   carvedilol (COREG) 12.5 MG tablet Take 1 tablet (12.5 mg total) by mouth 2 (two) times daily with a meal. 180 tablet 1   fluticasone (FLONASE) 50 MCG/ACT nasal spray USE 2 SPRAYS IN EACH NOSTRIL EVERY DAY 48 g 1   gabapentin (NEURONTIN) 300 MG capsule TAKE 1 CAPSULE BY MOUTH DAILY IN THE MORNING, 1 AT NOON AND 2 AT BEDTIME. 360 capsule 0   isosorbide mononitrate (IMDUR) 30 MG 24 hr tablet Take 1 tablet (30 mg total) by mouth daily. 90 tablet 1   linaclotide (LINZESS) 145 MCG CAPS capsule Take 1 capsule (145 mcg total) by mouth daily before breakfast. 30 capsule 5   methocarbamol (ROBAXIN) 500 MG tablet Take 1 tablet (500 mg total) by mouth 2 (two) times daily as needed.  2   montelukast (SINGULAIR) 10 MG tablet Take 1 tablet (10 mg total) by mouth at bedtime. 30 tablet 3   nitroGLYCERIN (NITROSTAT) 0.4 MG SL tablet Place 1 tablet (0.4 mg total) under the tongue every 5 (five) minutes as needed for chest pain (CP or SOB). 25 tablet 3   oxyCODONE-acetaminophen (PERCOCET) 7.5-325 MG tablet Take 1 tablet by mouth at bedtime as needed. 30 tablet 0   pantoprazole (PROTONIX) 40 MG tablet TAKE 1 TABLET BY MOUTH TWICE A DAY 180 tablet 3   potassium chloride SA (KLOR-CON M20) 20 MEQ tablet TAKE 1 TABLET BY MOUTH TWICE A DAY 180 tablet  0   VENTOLIN HFA 108 (90 Base) MCG/ACT inhaler INHALE 2 PUFFS INTO THE LUNGS EVERY 6 HOURS AS NEEDED FOR WHEEZING OR SHORTNESS OF BREATH. 18 g 0   losartan (COZAAR) 100 MG tablet Take 1 tablet (100 mg total) by mouth daily. 90 tablet 0   No facility-administered medications prior to visit.    Allergies  Allergen Reactions   Shrimp [Shellfish Allergy] Itching    Rash and lips itching   Ace Inhibitors Cough    Cough   Aspirin Nausea And Vomiting   Azithromycin Rash    ROS See HPI    Objective:    Physical Exam Constitutional:      Appearance: Normal appearance.  HENT:     Head: Normocephalic and atraumatic.     Right Ear: External ear normal.     Left Ear: External ear normal.  Eyes:     Extraocular Movements: Extraocular movements intact.     Pupils: Pupils are equal, round, and reactive to light.  Cardiovascular:     Rate and Rhythm: Normal rate and regular rhythm.     Pulses: Normal pulses.     Heart sounds: Normal heart sounds. No murmur heard.    No gallop.  Pulmonary:     Effort: Pulmonary effort is normal. No respiratory distress.     Breath sounds: Normal breath sounds. No wheezing.  Skin:    General: Skin is warm and dry.  Neurological:     Mental Status: She is alert and oriented to person, place, and time.  Psychiatric:        Judgment: Judgment  normal.     BP 132/73 (BP Location: Right Arm, Patient Position: Sitting, Cuff Size: Small)   Pulse 80   Temp 98.5 F (36.9 C) (Oral)   Resp 16   Wt 189 lb (85.7 kg)   LMP 11/25/1994   SpO2 100%   BMI 30.51 kg/m  Wt Readings from Last 3 Encounters:  05/29/22 189 lb (85.7 kg)  05/20/22 188 lb 12.8 oz (85.6 kg)  01/18/22 193 lb 12.8 oz (87.9 kg)       Assessment & Plan:   Problem List Items Addressed This Visit       Unprioritized   Hypertension    BP Readings from Last 3 Encounters:  05/29/22 132/73  05/20/22 (!) 174/73  01/18/22 (!) 143/66  Improved on increased dose of losartan. Continue  same. Obtain follow up bmet.       Relevant Medications   losartan (COZAAR) 100 MG tablet   Other Relevant Orders   Basic metabolic panel    Meds ordered this encounter  Medications   losartan (COZAAR) 100 MG tablet    Sig: Take 1 tablet (100 mg total) by mouth daily.    Dispense:  90 tablet    Refill:  0    Order Specific Question:   Supervising Provider    Answer:   Penni Homans A [[id number]]    I, Nance Pear, NP, personally preformed the services described in this documentation.  All medical record entries made by the scribe were at my direction and in my presence.  I have reviewed the chart and discharge instructions (if applicable) and agree that the record reflects my personal performance and is accurate and complete. 05/29/2022  I,Shehryar Baig,acting as a scribe for Nance Pear, NP.,have documented all relevant documentation on the behalf of Nance Pear, NP,as directed by  Nance Pear, NP while in the presence of Nance Pear, NP.  Nance Pear, NP

## 2022-05-29 NOTE — Assessment & Plan Note (Signed)
BP Readings from Last 3 Encounters:  05/29/22 132/73  05/20/22 (!) 174/73  01/18/22 (!) 143/66   Improved on increased dose of losartan. Continue same. Obtain follow up bmet.

## 2022-05-31 ENCOUNTER — Encounter: Payer: Self-pay | Admitting: Family

## 2022-05-31 NOTE — Progress Notes (Signed)
Mailed out to patient 

## 2022-06-12 DIAGNOSIS — I10 Essential (primary) hypertension: Secondary | ICD-10-CM | POA: Diagnosis not present

## 2022-06-12 DIAGNOSIS — E785 Hyperlipidemia, unspecified: Secondary | ICD-10-CM | POA: Diagnosis not present

## 2022-06-12 DIAGNOSIS — Z720 Tobacco use: Secondary | ICD-10-CM | POA: Diagnosis not present

## 2022-06-12 DIAGNOSIS — I255 Ischemic cardiomyopathy: Secondary | ICD-10-CM | POA: Diagnosis not present

## 2022-06-12 DIAGNOSIS — I2510 Atherosclerotic heart disease of native coronary artery without angina pectoris: Secondary | ICD-10-CM | POA: Diagnosis not present

## 2022-06-21 DIAGNOSIS — H524 Presbyopia: Secondary | ICD-10-CM | POA: Diagnosis not present

## 2022-06-21 DIAGNOSIS — H2513 Age-related nuclear cataract, bilateral: Secondary | ICD-10-CM | POA: Diagnosis not present

## 2022-06-21 DIAGNOSIS — H52223 Regular astigmatism, bilateral: Secondary | ICD-10-CM | POA: Diagnosis not present

## 2022-06-21 DIAGNOSIS — H5203 Hypermetropia, bilateral: Secondary | ICD-10-CM | POA: Diagnosis not present

## 2022-06-21 DIAGNOSIS — H35033 Hypertensive retinopathy, bilateral: Secondary | ICD-10-CM | POA: Diagnosis not present

## 2022-06-24 ENCOUNTER — Other Ambulatory Visit: Payer: Self-pay | Admitting: Family

## 2022-07-22 DIAGNOSIS — M5459 Other low back pain: Secondary | ICD-10-CM | POA: Diagnosis not present

## 2022-07-22 DIAGNOSIS — Z79891 Long term (current) use of opiate analgesic: Secondary | ICD-10-CM | POA: Diagnosis not present

## 2022-07-22 DIAGNOSIS — M1611 Unilateral primary osteoarthritis, right hip: Secondary | ICD-10-CM | POA: Diagnosis not present

## 2022-07-22 DIAGNOSIS — G894 Chronic pain syndrome: Secondary | ICD-10-CM | POA: Diagnosis not present

## 2022-07-22 DIAGNOSIS — Z79899 Other long term (current) drug therapy: Secondary | ICD-10-CM | POA: Diagnosis not present

## 2022-07-22 DIAGNOSIS — M5416 Radiculopathy, lumbar region: Secondary | ICD-10-CM | POA: Diagnosis not present

## 2022-07-22 DIAGNOSIS — Z5181 Encounter for therapeutic drug level monitoring: Secondary | ICD-10-CM | POA: Diagnosis not present

## 2022-09-02 ENCOUNTER — Encounter: Payer: Self-pay | Admitting: Family

## 2022-09-02 ENCOUNTER — Telehealth: Payer: Self-pay | Admitting: Family

## 2022-09-02 ENCOUNTER — Ambulatory Visit (INDEPENDENT_AMBULATORY_CARE_PROVIDER_SITE_OTHER): Payer: Medicare Other | Admitting: Family

## 2022-09-02 VITALS — BP 137/80 | HR 65 | Temp 98.4°F | Resp 16 | Wt 189.0 lb

## 2022-09-02 DIAGNOSIS — Z720 Tobacco use: Secondary | ICD-10-CM | POA: Diagnosis not present

## 2022-09-02 DIAGNOSIS — E785 Hyperlipidemia, unspecified: Secondary | ICD-10-CM | POA: Diagnosis not present

## 2022-09-02 DIAGNOSIS — Z23 Encounter for immunization: Secondary | ICD-10-CM

## 2022-09-02 DIAGNOSIS — I2510 Atherosclerotic heart disease of native coronary artery without angina pectoris: Secondary | ICD-10-CM

## 2022-09-02 DIAGNOSIS — I10 Essential (primary) hypertension: Secondary | ICD-10-CM | POA: Diagnosis not present

## 2022-09-02 DIAGNOSIS — E042 Nontoxic multinodular goiter: Secondary | ICD-10-CM

## 2022-09-02 DIAGNOSIS — K589 Irritable bowel syndrome without diarrhea: Secondary | ICD-10-CM

## 2022-09-02 DIAGNOSIS — E119 Type 2 diabetes mellitus without complications: Secondary | ICD-10-CM | POA: Diagnosis not present

## 2022-09-02 DIAGNOSIS — K219 Gastro-esophageal reflux disease without esophagitis: Secondary | ICD-10-CM | POA: Diagnosis not present

## 2022-09-02 DIAGNOSIS — J309 Allergic rhinitis, unspecified: Secondary | ICD-10-CM

## 2022-09-02 DIAGNOSIS — E876 Hypokalemia: Secondary | ICD-10-CM | POA: Diagnosis not present

## 2022-09-02 LAB — COMPREHENSIVE METABOLIC PANEL
ALT: 11 U/L (ref 0–35)
AST: 11 U/L (ref 0–37)
Albumin: 4.1 g/dL (ref 3.5–5.2)
Alkaline Phosphatase: 84 U/L (ref 39–117)
BUN: 12 mg/dL (ref 6–23)
CO2: 26 mEq/L (ref 19–32)
Calcium: 9.4 mg/dL (ref 8.4–10.5)
Chloride: 105 mEq/L (ref 96–112)
Creatinine, Ser: 0.8 mg/dL (ref 0.40–1.20)
GFR: 75.89 mL/min (ref >60.00)
Glucose, Bld: 88 mg/dL (ref 70–99)
Potassium: 4.3 mEq/L (ref 3.5–5.1)
Sodium: 141 mEq/L (ref 135–145)
Total Bilirubin: 0.4 mg/dL (ref 0.2–1.2)
Total Protein: 6.8 g/dL (ref 6.0–8.3)

## 2022-09-02 LAB — MICROALBUMIN / CREATININE URINE RATIO
Creatinine,U: 155.2 mg/dL
Microalb Creat Ratio: 1.2 mg/g (ref 0.0–30.0)
Microalb, Ur: 1.8 mg/dL (ref 0.0–1.9)

## 2022-09-02 MED ORDER — PANTOPRAZOLE SODIUM 40 MG PO TBEC: 40.0000 mg | DELAYED_RELEASE_TABLET | Freq: Every day | ORAL | 3 refills | Status: DC

## 2022-09-02 MED ORDER — PANTOPRAZOLE SODIUM 40 MG PO TBEC: 40.0000 mg | DELAYED_RELEASE_TABLET | Freq: Two times a day (BID) | ORAL | 3 refills | Status: DC

## 2022-09-02 MED ORDER — ROSUVASTATIN CALCIUM 20 MG PO TABS: 20.0000 mg | ORAL_TABLET | Freq: Every day | ORAL | 3 refills | Status: DC

## 2022-09-02 NOTE — Assessment & Plan Note (Signed)
>>  ASSESSMENT AND PLAN FOR DYSLIPIDEMIA WRITTEN ON 09/02/2022 10:19 AM BY O'SULLIVAN, Sumiko Ceasar, NP  Experiencing myalgias with atorvastatin . Will change to crestor - if she still does not tolerate will plan referral to Lipid clinic.

## 2022-09-02 NOTE — Patient Instructions (Addendum)
Stop Potassium. Stop Atorvastatin. Start Crestor. Let me know if your muscle pain does not improve with this change.  Consider getting new covid booster and RSV vaccine at your pharmacy.

## 2022-09-02 NOTE — Telephone Encounter (Signed)
Please call [facility] to request copy of DM eye exam.

## 2022-09-02 NOTE — Assessment & Plan Note (Signed)
Having ongoing symptoms on zyrtec and flonase.  She reports no improvement in her symptoms with singulair. Declines referral to allergies.

## 2022-09-02 NOTE — Assessment & Plan Note (Signed)
Maintained on pantoprazole-

## 2022-09-02 NOTE — Assessment & Plan Note (Signed)
Uses linzess as needed which is helpful.

## 2022-09-02 NOTE — Assessment & Plan Note (Signed)
Experiencing myalgias with atorvastatin. Will change to crestor- if she still does not tolerate will plan referral to Lipid clinic.

## 2022-09-02 NOTE — Progress Notes (Signed)
Subjective:     Patient ID: Jade [name], female    DOB: 10/19/1956, 66 y.o.   MRN: [id number]  Chief Complaint  Patient presents with   Hypertension    Here for follow up   Gastroesophageal Reflux    Here for follow up, needs refill    HPI  HTN- bp meds include losartan and carvedilol.  BP Readings from Last 3 Encounters:  09/02/22 137/80  05/29/22 132/73  05/20/22 (!) 174/73   GERD- maintained on protonix.   Chronic pain- following with pain management  Hyperlipidemia- Maintained on atorvastatin 70m.  Lab Results  Component Value Date   CHOL 142 05/20/2022   HDL 44.90 05/20/2022   LDLCALC 78 05/20/2022   LDLDIRECT 175.0 08/10/2018   TRIG 93.0 05/20/2022   CHOLHDL 3 05/20/2022   IBS- continues linzess.   CAD- continues to follow with cardiology.      Health Maintenance Due  Topic Date Due   OPHTHALMOLOGY EXAM  Never done   Diabetic kidney evaluation - Urine ACR  Never done   Zoster Vaccines- Shingrix (1 of 2) Never done   COVID-19 Vaccine (4 - Pfizer risk series) 11/17/2020   TETANUS/TDAP  07/14/2021    Past Medical History:  Diagnosis Date   Abnormal EKG 07/16/2011   [place]   Benign microscopic hematuria    neg cystoscopy 11/12- dr. nJanice Norrie  DJD (degenerative joint disease)    L KNEE   Family history of anesthesia complication    multiple family members with history of this   GERD (gastroesophageal reflux disease)    Hurthle cell neoplasm of thyroid    Hyperlipidemia LDL goal <70 10/23/2019   Hypertension    Malignant hyperthermia    [place]STEMI (non-ST elevated myocardial infarction) [place] 10/[place]   Numbness and tingling in left arm     Past Surgical History:  Procedure Laterality Date   [facility]   BREAST EXCISIONAL BIOPSY Left 03/2016   CERVICAL DISC SURGERY  2013   Dr. JArnoldo Morale  [place]  [place]   [place]    CORONARY BALLOON ANGIOPLASTY N/A 10/25/2019   [place]  Surgeon: KTroy Sine MD;  [facility];  Service: Cardiovascular;  Laterality: N/A;   CORONARY STENT INTERVENTION N/A 10/22/2019   Procedure: CORONARY STENT INTERVENTION;  Surgeon: KTroy Sine MD;  [facility];  Service: Cardiovascular;  Laterality: N/A;  mid lad    CORONARY STENT INTERVENTION N/A 10/25/2019   Procedure: CORONARY STENT INTERVENTION;  Surgeon: KTroy Sine MD;  [facility];  Service: Cardiovascular;  Laterality: N/A;   DILATION AND CURETTAGE OF UTERUS     KNEE CARTILAGE SURGERY  1999   right knee   LEFT HEART CATH AND CORONARY ANGIOGRAPHY N/A 10/22/2019   Procedure: LEFT HEART CATH AND CORONARY ANGIOGRAPHY;  Surgeon: KTroy Sine MD;  [facility];  Service: Cardiovascular;  Laterality: N/A;   ROTATOR CUFF REPAIR Right 05/25/2013   TOTAL KNEE ARTHROPLASTY Left 01/20/2014   Procedure: LEFT TOTAL KNEE ARTHROPLASTY;  Surgeon: JJohnn Hai MD;  [facility];  Service: Orthopedics;  Laterality: Left;   TOTAL KNEE ARTHROPLASTY Right 01/19/2015   Procedure: RIGHT TOTAL KNEE ARTHROPLASTY;  Surgeon: JJohnn Hai MD;  Location: [facility];  Service: Orthopedics;  Laterality: Right;   TUBAL LIGATION  Family History  Problem Relation Age of Onset   Hypertension Mother    Hyperlipidemia Mother    Heart disease Father        CAD; stent at age 4   Diabetes Father    Hypertension Father    Hyperlipidemia Father    Cancer Father        prostate   Dementia Father    Osteoarthritis Sister    Hypertension Sister    COPD Sister    CAD Sister        s/p stent   Edema Sister        cause unknown   Osteoporosis Sister        bad hips, bed bound, has one leg amputated   Drug abuse Sister    Dementia Paternal Grandmother    Cancer Paternal Aunt        BREAST   Breast cancer Paternal Aunt    Colon  cancer Neg Hx     Social History   Socioeconomic History   Marital status: Married    Spouse name: Not on file   Number of children: 3   Years of education: Not on file   Highest education level: Not on file  Occupational History   Not on file  Tobacco Use   Smoking status: Every Day    Packs/day: 0.50    Years: 39.00    Total pack years: 19.50    Types: Cigarettes    Last attempt to quit: 11/2019    Years since quitting: 2.7   Smokeless tobacco: Never  Vaping Use   Vaping Use: Never used  Substance and Sexual Activity   Alcohol use: No    Alcohol/week: 0.0 standard drinks of alcohol   Drug use: No   Sexual activity: Yes    Partners: Male  Other Topics Concern   Not on file  Social History Narrative   Regular exercise:  No   Caffeine Use:  2 cups coffee and 3-4 glasses of soda daily.   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   Social Determinants of Health   Financial Resource Strain: Low Risk  (09/06/2021)   Overall Financial Resource Strain (CARDIA)    Difficulty of Paying Living Expenses: Not hard at all  Food Insecurity: No Food Insecurity (09/06/2021)   Hunger Vital Sign    Worried About Running Out of Food in the Last Year: Never true    Ran Out of Food in the Last Year: Never true  Transportation Needs: No Transportation Needs (09/06/2021)   PRAPARE - Hydrologist (Medical): No    Lack of Transportation (Non-Medical): No  Physical Activity: Inactive (09/06/2021)   Exercise Vital Sign    Days of Exercise per Week: 0 days    Minutes of Exercise per Session: 0 min  Stress: No Stress Concern Present (09/06/2021)   [place]    Feeling of Stress : Not at all  Social Connections: [place] (09/06/2021)   Social Connection and Isolation Panel [NHANES]    Frequency of Communication with Friends  and Family: More than three times a week    Frequency of Social Gatherings with Friends and Family: More than three times a week    Attends Religious Services: More than 4 times per year    Active Member of [employer] or Organizations: Yes  Attends Archivist Meetings: More than 4 times per year    Marital Status: Married  Human resources officer Violence: Not At Risk (09/06/2021)   Humiliation, Afraid, Rape, and Kick questionnaire    Fear of Current or Ex-Partner: No    Emotionally Abused: No    Physically Abused: No    Sexually Abused: No    Outpatient Medications Prior to Visit  Medication Sig Dispense Refill   aspirin 81 MG chewable tablet Chew 81 mg by mouth daily.     carvedilol (COREG) 12.5 MG tablet Take 1 tablet (12.5 mg total) by mouth 2 (two) times daily with a meal. 180 tablet 1   fluticasone (FLONASE) 50 MCG/ACT nasal spray USE 2 SPRAYS IN EACH NOSTRIL EVERY DAY 48 g 1   gabapentin (NEURONTIN) 300 MG capsule TAKE 1 CAPSULE BY MOUTH DAILY IN THE MORNING, 1 AT NOON AND 2 AT BEDTIME. 360 capsule 0   isosorbide mononitrate (IMDUR) 30 MG 24 hr tablet Take 1 tablet (30 mg total) by mouth daily. 90 tablet 1   linaclotide (LINZESS) 145 MCG CAPS capsule Take 1 capsule (145 mcg total) by mouth daily before breakfast. 30 capsule 5   losartan (COZAAR) 100 MG tablet TAKE 1 TABLET BY MOUTH EVERY DAY 90 tablet 0   methocarbamol (ROBAXIN) 500 MG tablet Take 1 tablet (500 mg total) by mouth 2 (two) times daily as needed.  2   nitroGLYCERIN (NITROSTAT) 0.4 MG SL tablet Place 1 tablet (0.4 mg total) under the tongue every 5 (five) minutes as needed for chest pain (CP or SOB). 25 tablet 3   oxyCODONE-acetaminophen (PERCOCET) 7.5-325 MG tablet Take 1 tablet by mouth at bedtime as needed. 30 tablet 0   potassium chloride SA (KLOR-CON M20) 20 MEQ tablet TAKE 1 TABLET BY MOUTH TWICE A DAY 180 tablet 0   VENTOLIN HFA 108 (90 Base) MCG/ACT inhaler INHALE 2 PUFFS INTO THE LUNGS EVERY 6 HOURS AS NEEDED  FOR WHEEZING OR SHORTNESS OF BREATH. 18 g 0   atorvastatin (LIPITOR) 80 MG tablet Take 1 tablet (80 mg total) by mouth daily. 90 tablet 1   montelukast (SINGULAIR) 10 MG tablet Take 1 tablet (10 mg total) by mouth at bedtime. 30 tablet 3   pantoprazole (PROTONIX) 40 MG tablet TAKE 1 TABLET BY MOUTH TWICE A DAY 180 tablet 3   No facility-administered medications prior to visit.    Allergies  Allergen Reactions   Shrimp [Shellfish Allergy] Itching    Rash and lips itching   Ace Inhibitors Cough    Cough   Aspirin Nausea And Vomiting   Azithromycin Rash    ROS See HPI    Objective:    Physical Exam Constitutional:      General: She is not in acute distress.    Appearance: Normal appearance. She is well-developed.  HENT:     Head: Normocephalic and atraumatic.     Right Ear: External ear normal.     Left Ear: External ear normal.  Eyes:     General: No scleral icterus. Neck:     Thyroid: No thyromegaly.  Cardiovascular:     Rate and Rhythm: Normal rate and regular rhythm.     Heart sounds: Normal heart sounds. No murmur heard. Pulmonary:     Effort: Pulmonary effort is normal. No respiratory distress.     Breath sounds: Normal breath sounds. No wheezing.  Musculoskeletal:     Cervical back: Neck supple.  Skin:    General: Skin is warm  and dry.  Neurological:     Mental Status: She is alert and oriented to person, place, and time.  Psychiatric:        Mood and Affect: Mood normal.        Behavior: Behavior normal.        Thought Content: Thought content normal.        Judgment: Judgment normal.     BP 137/80 (BP Location: Right Arm, Patient Position: Sitting, Cuff Size: Small)   Pulse 65   Temp 98.4 F (36.9 C) (Oral)   Resp 16   Wt 189 lb (85.7 kg)   LMP 11/25/1994   SpO2 100%   BMI 30.51 kg/m  Wt Readings from Last 3 Encounters:  09/02/22 189 lb (85.7 kg)  05/29/22 189 lb (85.7 kg)  05/20/22 188 lb 12.8 oz (85.6 kg)       Assessment & Plan:    Problem List Items Addressed This Visit       Unprioritized   Tobacco abuse    She is getting ready to quit. I encouraged her.       Multinodular goiter    Lab Results  Component Value Date   TSH 1.14 05/20/2022  Repeat thyroid US for surveillance.       Relevant Orders   US THYROID   IBS (irritable bowel syndrome)    Uses linzess as needed which is helpful.       Relevant Medications   pantoprazole (PROTONIX) 40 MG tablet   Hypertension - Primary    BP Readings from Last 3 Encounters:  09/02/22 137/80  05/29/22 132/73  05/20/22 (!) 174/73  Stable on current dose of carvedilol and losartan.       Relevant Medications   rosuvastatin (CRESTOR) 20 MG tablet   Other Relevant Orders   Comp Met (CMET)   GERD (gastroesophageal reflux disease)    Maintained on pantoprazole-       Relevant Medications   pantoprazole (PROTONIX) 40 MG tablet   Dyslipidemia    Experiencing myalgias with atorvastatin. Will change to crestor- if she still does not tolerate will plan referral to Lipid clinic.      Relevant Medications   rosuvastatin (CRESTOR) 20 MG tablet   Diabetes type 2, controlled (HCC)    Lab Results  Component Value Date   HGBA1C 6.6 (H) 05/20/2022   HGBA1C 6.7 (H) 06/13/2021   HGBA1C 6.4 04/25/2020   Lab Results  Component Value Date   LDLCALC 78 05/20/2022   CREATININE 0.90 05/29/2022  Will repeat A1C.        Relevant Medications   rosuvastatin (CRESTOR) 20 MG tablet   Other Relevant Orders   Urine Microalbumin w/creat. ratio   CAD in native artery s/p DES LAD, DES RCA 10/26/19    Management per cardiology.       Relevant Medications   rosuvastatin (CRESTOR) 20 MG tablet   Allergic rhinitis    Having ongoing symptoms on zyrtec and flonase.  She reports no improvement in her symptoms with singulair. Declines referral to allergies.       Other Visit Diagnoses     Needs flu shot       Relevant Orders   Flu Vaccine QUAD High Dose(Fluad)  (Completed)   Hypokalemia       Relevant Orders   Basic metabolic panel   Need for pneumococcal 20-valent conjugate vaccination       Relevant Orders   Pneumococcal conjugate vaccine 20-valent (Prevnar 20) (Completed)  I have discontinued Tia Masker. [place]'s atorvastatin, pantoprazole, and montelukast. I have also changed her pantoprazole. Additionally, I am having her start on rosuvastatin. Lastly, I am having her maintain her fluticasone, oxyCODONE-acetaminophen, methocarbamol, carvedilol, isosorbide mononitrate, nitroGLYCERIN, Ventolin HFA, linaclotide, aspirin, gabapentin, potassium chloride SA, and losartan.  Meds ordered this encounter  Medications   DISCONTD: pantoprazole (PROTONIX) 40 MG tablet    Sig: Take 1 tablet (40 mg total) by mouth 2 (two) times daily.    Dispense:  180 tablet    Refill:  3   rosuvastatin (CRESTOR) 20 MG tablet    Sig: Take 1 tablet (20 mg total) by mouth daily.    Dispense:  30 tablet    Refill:  3    Order Specific Question:   Supervising Provider    Answer:   Penni Homans A [[id number]]   pantoprazole (PROTONIX) 40 MG tablet    Sig: Take 1 tablet (40 mg total) by mouth daily.    Dispense:  180 tablet    Refill:  3    Order Specific Question:   Supervising Provider    Answer:   Penni Homans A [[id number]]

## 2022-09-02 NOTE — Assessment & Plan Note (Signed)
BP Readings from Last 3 Encounters:  09/02/22 137/80  05/29/22 132/73  05/20/22 (!) 174/73   Stable on current dose of carvedilol and losartan.

## 2022-09-02 NOTE — Assessment & Plan Note (Signed)
Management per cardiology. 

## 2022-09-02 NOTE — Assessment & Plan Note (Signed)
Lab Results  Component Value Date   TSH 1.14 05/20/2022   Repeat thyroid US for surveillance.

## 2022-09-02 NOTE — Assessment & Plan Note (Signed)
She is getting ready to quit. I encouraged her.

## 2022-09-02 NOTE — Assessment & Plan Note (Addendum)
Lab Results  Component Value Date   HGBA1C 6.6 (H) 05/20/2022   HGBA1C 6.7 (H) 06/13/2021   HGBA1C 6.4 04/25/2020   Lab Results  Component Value Date   LDLCALC 78 05/20/2022   CREATININE 0.90 05/29/2022   Will repeat A1C.

## 2022-09-02 NOTE — Telephone Encounter (Signed)
Request faxed

## 2022-09-03 ENCOUNTER — Encounter: Payer: Self-pay | Admitting: Family

## 2022-09-03 ENCOUNTER — Telehealth (HOSPITAL_BASED_OUTPATIENT_CLINIC_OR_DEPARTMENT_OTHER): Payer: Self-pay

## 2022-09-03 LAB — HM DIABETES EYE EXAM

## 2022-09-03 NOTE — Progress Notes (Signed)
Mailed out to pt 

## 2022-09-04 ENCOUNTER — Encounter (HOSPITAL_BASED_OUTPATIENT_CLINIC_OR_DEPARTMENT_OTHER): Payer: Self-pay

## 2022-09-04 ENCOUNTER — Emergency Department (HOSPITAL_BASED_OUTPATIENT_CLINIC_OR_DEPARTMENT_OTHER)
Admission: EM | Admit: 2022-09-04 | Discharge: 2022-09-04 | Disposition: A | Discharge status: Home / Self Care | Payer: Medicare Other | Attending: Emergency Medicine | Admitting: Emergency Medicine

## 2022-09-04 ENCOUNTER — Other Ambulatory Visit: Payer: Self-pay

## 2022-09-04 DIAGNOSIS — R112 Nausea with vomiting, unspecified: Secondary | ICD-10-CM | POA: Insufficient documentation

## 2022-09-04 DIAGNOSIS — R110 Nausea: Secondary | ICD-10-CM | POA: Diagnosis not present

## 2022-09-04 DIAGNOSIS — R519 Headache, unspecified: Secondary | ICD-10-CM | POA: Insufficient documentation

## 2022-09-04 DIAGNOSIS — I10 Essential (primary) hypertension: Secondary | ICD-10-CM | POA: Diagnosis not present

## 2022-09-04 DIAGNOSIS — Z20822 Contact with and (suspected) exposure to covid-19: Secondary | ICD-10-CM | POA: Insufficient documentation

## 2022-09-04 LAB — CBC WITH DIFFERENTIAL/PLATELET
Abs Immature Granulocytes: 0.03 10*3/uL (ref 0.00–0.07)
Basophils Absolute: 0 10*3/uL (ref 0.0–0.1)
Basophils Relative: 0 %
Eosinophils Absolute: 0.1 10*3/uL (ref 0.0–0.5)
Eosinophils Relative: 1 %
HCT: 39.2 % (ref 36.0–46.0)
Hemoglobin: 12.5 g/dL (ref 12.0–15.0)
Immature Granulocytes: 0 %
Lymphocytes Relative: 18 %
Lymphs Abs: 2.3 10*3/uL (ref 0.7–4.0)
MCH: 26.9 pg (ref 26.0–34.0)
MCHC: 31.9 g/dL (ref 30.0–36.0)
MCV: 84.5 fL (ref 80.0–100.0)
Monocytes Absolute: 1.4 10*3/uL — ABNORMAL HIGH (ref 0.1–1.0)
Monocytes Relative: 11 %
Neutro Abs: 8.8 10*3/uL — ABNORMAL HIGH (ref 1.7–7.7)
Neutrophils Relative %: 70 %
Platelets: 294 10*3/uL (ref 150–400)
RBC: 4.64 MIL/uL (ref 3.87–5.11)
RDW: 14.5 % (ref 11.5–15.5)
WBC: 12.6 10*3/uL — ABNORMAL HIGH (ref 4.0–10.5)
nRBC: 0 % (ref 0.0–0.2)

## 2022-09-04 LAB — BASIC METABOLIC PANEL
Anion gap: 7 (ref 5–15)
BUN: 9 mg/dL (ref 8–23)
CO2: 26 mmol/L (ref 22–32)
Calcium: 8.8 mg/dL — ABNORMAL LOW (ref 8.9–10.3)
Chloride: 106 mmol/L (ref 98–111)
Creatinine, Ser: 0.68 mg/dL (ref 0.44–1.00)
GFR, Estimated: >60 mL/min (ref >60)
Glucose, Bld: 104 mg/dL — ABNORMAL HIGH (ref 70–99)
Potassium: 3.6 mmol/L (ref 3.5–5.1)
Sodium: 139 mmol/L (ref 135–145)

## 2022-09-04 LAB — RESP PANEL BY RT-PCR (FLU A&B, COVID) ARPGX2
Influenza A by PCR: NEGATIVE
Influenza B by PCR: NEGATIVE
SARS Coronavirus 2 by RT PCR: NEGATIVE

## 2022-09-04 MED ORDER — ACETAMINOPHEN 500 MG PO TABS
1000.0000 mg | ORAL_TABLET | Freq: Once | ORAL | Status: AC
  Administered 2022-09-04: 1000 mg via ORAL
  Filled 2022-09-04: qty 2

## 2022-09-04 MED ORDER — KETOROLAC TROMETHAMINE 15 MG/ML IJ SOLN
15.0000 mg | Freq: Once | INTRAMUSCULAR | Status: AC
  Administered 2022-09-04: 15 mg via INTRAVENOUS
  Filled 2022-09-04: qty 1

## 2022-09-04 MED ORDER — ONDANSETRON HCL 4 MG/2ML IJ SOLN
4.0000 mg | Freq: Once | INTRAMUSCULAR | Status: AC
  Administered 2022-09-04: 4 mg via INTRAVENOUS
  Filled 2022-09-04: qty 2

## 2022-09-04 MED ORDER — ONDANSETRON HCL 4 MG PO TABS: 4.0000 mg | ORAL_TABLET | Freq: Four times a day (QID) | ORAL | 0 refills | Status: DC | PRN

## 2022-09-04 NOTE — ED Provider Notes (Signed)
[facility] EMERGENCY DEPARTMENT Provider Note   CSN: [id number] Arrival date & time: 09/04/22  6440     History Chief Complaint  Patient presents with   Nausea    HPI GINNI EICHLER is a 66 y.o. female presenting for 3 weeks of sinus pressure and headaches. Culminated with a headache and/ NV today. NBNB vomiting x1.  No abdominal pain.  She states that she has had sinusitis like symptoms intermittently over the past 3 to recurrent episode going on for 3 days.  The abdominal pain, nausea vomiting got worse at work today.  She has not take any medications prior to arrival.  Patient otherwise ambulatory tolerating small months p.o. intake.   Patient's recorded medical, surgical, social, medication list and allergies were reviewed in the Snapshot window as part of the initial history.   Review of Systems   Review of Systems  Constitutional:  Positive for fatigue. Negative for chills and fever.  HENT:  Positive for congestion. Negative for ear pain and sore throat.   Eyes:  Negative for pain and visual disturbance.  Respiratory:  Positive for cough. Negative for shortness of breath.   Cardiovascular:  Negative for chest pain and palpitations.  Gastrointestinal:  Positive for nausea and vomiting. Negative for abdominal pain.  Genitourinary:  Negative for dysuria and hematuria.  Musculoskeletal:  Negative for arthralgias and back pain.  Skin:  Negative for color change and rash.  Neurological:  Negative for seizures and syncope.  All other systems reviewed and are negative.   Physical Exam Updated Vital Signs BP 132/77   Pulse 62   Temp 98.3 F (36.8 C) (Oral)   Resp 18   LMP 11/25/1994   SpO2 99%  Physical Exam Vitals and nursing note reviewed.  Constitutional:      General: She is not in acute distress.    Appearance: She is well-developed.  HENT:     Head: Normocephalic and atraumatic.  Eyes:     Conjunctiva/sclera: Conjunctivae normal.  Cardiovascular:      Rate and Rhythm: Normal rate and regular rhythm.     Heart sounds: No murmur heard. Pulmonary:     Effort: Pulmonary effort is normal. No respiratory distress.     Breath sounds: Normal breath sounds.  Abdominal:     General: There is no distension.     Palpations: Abdomen is soft.     Tenderness: There is no abdominal tenderness. There is no right CVA tenderness or left CVA tenderness.  Musculoskeletal:        General: No swelling or tenderness. Normal range of motion.     Cervical back: Neck supple.  Skin:    General: Skin is warm and dry.  Neurological:     General: No focal deficit present.     Mental Status: She is alert and oriented to person, place, and time. Mental status is at baseline.     Cranial Nerves: No cranial nerve deficit.      ED Course/ Medical Decision Making/ A&P    Procedures Procedures   Medications Ordered in ED Medications  ketorolac (TORADOL) 15 MG/ML injection 15 mg (15 mg Intravenous Given 09/04/22 1005)  acetaminophen (TYLENOL) tablet 1,000 mg (1,000 mg Oral Given 09/04/22 1004)  ondansetron (ZOFRAN) injection 4 mg (4 mg Intravenous Given 09/04/22 1005)   Medical Decision Making:   ROANNA REAVES is a 66 y.o. female who presented to the ED today with subjective fever, cough, congestion detailed above.    Patient's  presentation is complicated by their history of advanced age, multiple medical comorbidities, chronic outpatient medication regimen.  Patient placed on continuous vitals and telemetry monitoring while in ED which was reviewed periodically.   Complete initial physical exam performed, notably the patient  was hemodynamically stable in no acute distress.  Posterior oropharynx illuminated and without obvious swelling or deformity.  Patient is without neck stiffness.    Reviewed and confirmed nursing documentation for past medical history, family history, social history.    Initial Assessment:   With the patient's presentation of  fever cough congestion, most likely diagnosis is developing viral upper respiratory infection. Other diagnoses were considered including (but not limited to) peritonsillar abscess, retropharyngeal abscess, pneumonia. These are considered less likely due to history of present illness and physical exam findings.   This is most consistent with an acute complicated illness Considered meningitis, however patient's symptoms, vital signs, physical exam findings including lack of meningismus seem grossly less consistent at this time. Initial Plan:  Screening labs including CBC and Metabolic panel to evaluate for infectious or metabolic etiology of disease.  Viral screening including COVID/flu testing to evaluate for common viral etiologies that need to be tracked Empiric treatment with antipyretics including acetaminophen in ambulatory setting Will augment with dose of ketorolac in ED  Objective evaluation as below reviewed   Initial Study Results:   Laboratory  All laboratory results reviewed without evidence of clinically relevant pathology.   Exceptions include: leukocytosis   Final Assessment and Plan:   On reassessment, patient is ambulatory tolerating p.o. intake in no acute distress.   Patient's COVID test is negative. Patient endorses complete resolution of her symptoms.  Unless uncertain underlying etiology.  Patient had a recent UA in the outpatient setting which is nondiagnostic for UTI she states.  She is otherwise endorsing some chronic improvement and requesting a work note for work today.  Given symptomatic improvement, favor likely viral syndrome.  We will plan for patient to continue with antipyretics, antinausea medications with strict return precautions if her syndrome worsens again.  Patient is currently stable for outpatient care and management with no indication for hospitalization or transfer at this time.  Discussed all findings with patient expressed  understanding.  Disposition:  Based on the above findings, I believe patient is stable for discharge.    Patient/family educated about specific return precautions for given chief complaint and symptoms.  Patient/family educated about follow-up with PCP.     Patient/family expressed understanding of return precautions and need for follow-up. Patient spoken to regarding all imaging and laboratory results and appropriate follow up for these results. All education provided in verbal form with additional information in written form. Time was allowed for answering of patient questions. Patient discharged.    Emergency Department Medication Summary:   Medications  ketorolac (TORADOL) 15 MG/ML injection 15 mg (15 mg Intravenous Given 09/04/22 1005)  acetaminophen (TYLENOL) tablet 1,000 mg (1,000 mg Oral Given 09/04/22 1004)  ondansetron (ZOFRAN) injection 4 mg (4 mg Intravenous Given 09/04/22 1005)    linical Impression:  1. Nausea      Discharge   Final Clinical Impression(s) / ED Diagnoses Final diagnoses:  Nausea    Rx / DC Orders ED Discharge Orders          Ordered    ondansetron (ZOFRAN) 4 MG tablet  Every 6 hours PRN        09/04/22 1114              Gianah Batt,  Cheri Rous, MD 09/04/22 1115

## 2022-09-04 NOTE — ED Triage Notes (Signed)
Got Flu & pneumonia vaccine Monday. Started having headache & emesis x 1. Continues to have nausea.

## 2022-09-08 ENCOUNTER — Encounter: Payer: Self-pay | Admitting: Family

## 2022-09-08 DIAGNOSIS — H35039 Hypertensive retinopathy, unspecified eye: Secondary | ICD-10-CM | POA: Insufficient documentation

## 2022-09-09 ENCOUNTER — Ambulatory Visit: Payer: Medicare Other

## 2022-09-11 ENCOUNTER — Telehealth (HOSPITAL_BASED_OUTPATIENT_CLINIC_OR_DEPARTMENT_OTHER): Payer: Self-pay

## 2022-09-16 ENCOUNTER — Other Ambulatory Visit (INDEPENDENT_AMBULATORY_CARE_PROVIDER_SITE_OTHER): Payer: Medicare Other

## 2022-09-16 DIAGNOSIS — E876 Hypokalemia: Secondary | ICD-10-CM

## 2022-09-16 LAB — BASIC METABOLIC PANEL
BUN: 10 mg/dL (ref 6–23)
CO2: 28 mEq/L (ref 19–32)
Calcium: 9.3 mg/dL (ref 8.4–10.5)
Chloride: 107 mEq/L (ref 96–112)
Creatinine, Ser: 0.85 mg/dL (ref 0.40–1.20)
GFR: 70.55 mL/min (ref >60.00)
Glucose, Bld: 89 mg/dL (ref 70–99)
Potassium: 3.7 mEq/L (ref 3.5–5.1)
Sodium: 142 mEq/L (ref 135–145)

## 2022-09-17 ENCOUNTER — Encounter: Payer: Self-pay | Admitting: Family

## 2022-09-19 NOTE — Progress Notes (Signed)
Mailed out to pt 

## 2022-10-14 ENCOUNTER — Other Ambulatory Visit: Payer: Self-pay | Admitting: Family

## 2022-10-18 ENCOUNTER — Other Ambulatory Visit: Payer: Self-pay | Admitting: Family

## 2022-11-11 DIAGNOSIS — Z79891 Long term (current) use of opiate analgesic: Secondary | ICD-10-CM | POA: Diagnosis not present

## 2022-11-11 DIAGNOSIS — M5459 Other low back pain: Secondary | ICD-10-CM | POA: Diagnosis not present

## 2022-11-11 DIAGNOSIS — G894 Chronic pain syndrome: Secondary | ICD-10-CM | POA: Diagnosis not present

## 2022-11-11 DIAGNOSIS — M5416 Radiculopathy, lumbar region: Secondary | ICD-10-CM | POA: Diagnosis not present

## 2022-12-02 ENCOUNTER — Encounter: Payer: Self-pay | Admitting: Family

## 2022-12-02 ENCOUNTER — Ambulatory Visit (INDEPENDENT_AMBULATORY_CARE_PROVIDER_SITE_OTHER): Payer: Medicare Other | Admitting: Family

## 2022-12-02 VITALS — BP 142/70 | HR 62 | Temp 98.4°F | Resp 16 | Wt 181.0 lb

## 2022-12-02 DIAGNOSIS — E785 Hyperlipidemia, unspecified: Secondary | ICD-10-CM

## 2022-12-02 DIAGNOSIS — I10 Essential (primary) hypertension: Secondary | ICD-10-CM | POA: Diagnosis not present

## 2022-12-02 DIAGNOSIS — Z720 Tobacco use: Secondary | ICD-10-CM

## 2022-12-02 LAB — COMPREHENSIVE METABOLIC PANEL
ALT: 6 U/L (ref 0–35)
AST: 9 U/L (ref 0–37)
Albumin: 4.2 g/dL (ref 3.5–5.2)
Alkaline Phosphatase: 75 U/L (ref 39–117)
BUN: 11 mg/dL (ref 6–23)
CO2: 28 mEq/L (ref 19–32)
Calcium: 9.4 mg/dL (ref 8.4–10.5)
Chloride: 105 mEq/L (ref 96–112)
Creatinine, Ser: 0.84 mg/dL (ref 0.40–1.20)
GFR: 71.45 mL/min (ref >60.00)
Glucose, Bld: 87 mg/dL (ref 70–99)
Potassium: 4.2 mEq/L (ref 3.5–5.1)
Sodium: 141 mEq/L (ref 135–145)
Total Bilirubin: 0.5 mg/dL (ref 0.2–1.2)
Total Protein: 7 g/dL (ref 6.0–8.3)

## 2022-12-02 MED ORDER — GABAPENTIN 300 MG PO CAPS: ORAL_CAPSULE | ORAL | 1 refills | Status: DC

## 2022-12-02 NOTE — Progress Notes (Signed)
Subjective:   By signing my name below, I, Shehryar Baig, attest that this documentation has been prepared under the direction and in the presence of Debbrah Alar, NP. 12/02/2022   Patient ID: Jade [name], female    DOB: 1955/12/29, 67 y.o.   MRN: [id number]  Chief Complaint  Patient presents with   Hypertension    Here for follow up    HPI Patient is in today for a follow up visit.   Hip pain: She complains of pain in her hip. She is considering getting hip replacement. She has received an injection in the past and found it lasted for 3 months. She is planning on following up with her hip specialist.   Blood pressure: Pt is maintained on 100 mg losartan daily PO, 12.5 mg carvedilol 2x daily PO. She continues taking 20 meq potassium chloride SA daily PO.  BP Readings from Last 3 Encounters:  12/02/22 (!) 142/70  09/04/22 (!) 146/81  09/02/22 137/80   Pulse Readings from Last 3 Encounters:  12/02/22 62  09/04/22 60  09/02/22 65   Cholesterol: Her last cholesterol levels looked good while taking 20 mg rosuvastatin daily PO. Lab Results  Component Value Date   CHOL 142 05/20/2022   HDL 44.90 05/20/2022   LDLCALC 78 05/20/2022   LDLDIRECT 175.0 08/10/2018   TRIG 93.0 05/20/2022   CHOLHDL 3 05/20/2022   Smoking: She continues smoking 0.5 packs of cigarettes per day. She is interested in quitting. She has tried chantix in the past but it was not effective. She is planning on quitting on her own with no aide.   Weight: She has lost 8 lbs since her last visit. Her appetite is decreased due to getting used to her new dentures.  Wt Readings from Last 3 Encounters:  12/02/22 181 lb (82.1 kg)  09/02/22 189 lb (85.7 kg)  05/29/22 189 lb (85.7 kg)    Past Medical History:  Diagnosis Date   Abnormal EKG 07/16/2011   ACS (acute coronary syndrome) ([place]) 10/22/2019   Benign microscopic hematuria    neg cystoscopy 11/12- dr. Janice Norrie   DJD (degenerative joint disease)    L  KNEE   Family history of anesthesia complication    multiple family members with history of this   GERD (gastroesophageal reflux disease)    Hurthle cell neoplasm of thyroid    Hyperlipidemia LDL goal <70 10/23/2019   Hypertension    Hypertensive retinopathy    Malignant hyperthermia    NSTEMI (non-ST elevated myocardial infarction) ([place]) 10/22/2019   Numbness and tingling in left arm     Past Surgical History:  Procedure Laterality Date   [place] EXCISIONAL BIOPSY Left 01/2016   BREAST EXCISIONAL BIOPSY Left 03/2016   CERVICAL DISC SURGERY  2013   Dr. Arnoldo Morale   COLONOSCOPY  2010   in [place], -normal exam   CORONARY BALLOON ANGIOPLASTY N/A 10/25/2019   Procedure: [place];  Surgeon: Troy Sine, MD;  Location: [facility];  Service: Cardiovascular;  Laterality: N/A;   CORONARY STENT INTERVENTION N/A 10/22/2019   Procedure: CORONARY STENT INTERVENTION;  Surgeon: Troy Sine, MD;  Location: [facility];  Service: Cardiovascular;  Laterality: N/A;  mid lad    CORONARY STENT INTERVENTION N/A 10/25/2019   Procedure: CORONARY STENT INTERVENTION;  Surgeon: Troy Sine, MD;  Location: [facility];  Service: Cardiovascular;  Laterality: N/A;   DILATION AND CURETTAGE OF  UTERUS     KNEE CARTILAGE SURGERY  1999   right knee   LEFT HEART CATH AND CORONARY ANGIOGRAPHY N/A 10/22/2019   Procedure: LEFT HEART CATH AND CORONARY ANGIOGRAPHY;  Surgeon: Troy Sine, MD;  Location: [facility];  Service: Cardiovascular;  Laterality: N/A;   ROTATOR CUFF REPAIR Right 05/25/2013   TOTAL KNEE ARTHROPLASTY Left 01/20/2014   Procedure: LEFT TOTAL KNEE ARTHROPLASTY;  Surgeon: Johnn Hai, MD;  Location: [facility];  Service: Orthopedics;  Laterality: Left;   TOTAL KNEE ARTHROPLASTY Right 01/19/2015   Procedure: RIGHT TOTAL KNEE ARTHROPLASTY;  Surgeon: Johnn Hai, MD;  Location: [facility];  Service:  Orthopedics;  Laterality: Right;   TUBAL LIGATION      Family History  Problem Relation Age of Onset   Hypertension Mother    Hyperlipidemia Mother    Heart disease Father        CAD; stent at age 34   Diabetes Father    Hypertension Father    Hyperlipidemia Father    Cancer Father        prostate   Dementia Father    Osteoarthritis Sister    Hypertension Sister    COPD Sister    CAD Sister        s/p stent   Edema Sister        cause unknown   Osteoporosis Sister        bad hips, bed bound, has one leg amputated   Drug abuse Sister    Dementia Paternal Grandmother    Cancer Paternal Aunt        BREAST   Breast cancer Paternal Aunt    Colon cancer Neg Hx     Social History   Socioeconomic History   Marital status: Married    Spouse name: Not on file   Number of children: 3   Years of education: Not on file   Highest education level: Not on file  Occupational History   Not on file  Tobacco Use   Smoking status: Every Day    Packs/day: 0.50    Years: 39.00    Total pack years: 19.50    Types: Cigarettes    Last attempt to quit: 11/2019    Years since quitting: 3.0   Smokeless tobacco: Never  Vaping Use   Vaping Use: Never used  Substance and Sexual Activity   Alcohol use: No    Alcohol/week: 0.0 standard drinks of alcohol   Drug use: No   Sexual activity: Yes    Partners: Male  Other Topics Concern   Not on file  Social History Narrative   Regular exercise:  No   Caffeine Use:  2 cups coffee and 3-4 glasses of soda daily.   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   Social Determinants of Health   Financial Resource Strain: Low Risk  (09/06/2021)   Overall Financial Resource Strain (CARDIA)    Difficulty of Paying Living Expenses: Not hard at all  Food Insecurity: No Food Insecurity (09/06/2021)   Hunger Vital Sign    Worried About Running Out of Food in the Last Year: Never true     Ran Out of Food in the Last Year: Never true  Transportation Needs: No Transportation Needs (09/06/2021)   PRAPARE - Hydrologist (Medical): No    Lack of Transportation (Non-Medical): No  Physical  Activity: Inactive (09/06/2021)   Exercise Vital Sign    Days of Exercise per Week: 0 days    Minutes of Exercise per Session: 0 min  Stress: No Stress Concern Present (09/06/2021)   [employer]    Feeling of Stress : Not at all  Social Connections: [place] (09/06/2021)   Social Connection and Isolation Panel [NHANES]    Frequency of Communication with Friends and Family: More than three times a week    Frequency of Social Gatherings with Friends and Family: More than three times a week    Attends Religious Services: More than 4 times per year    Active Member of [employer] or Organizations: Yes    Attends Music therapist: More than 4 times per year    Marital Status: Married  Human resources officer Violence: Not At Risk (09/06/2021)   Humiliation, Afraid, Rape, and Kick questionnaire    Fear of Current or Ex-Partner: No    Emotionally Abused: No    Physically Abused: No    Sexually Abused: No    Outpatient Medications Prior to Visit  Medication Sig Dispense Refill   aspirin 81 MG chewable tablet Chew 81 mg by mouth daily.     carvedilol (COREG) 12.5 MG tablet Take 1 tablet (12.5 mg total) by mouth 2 (two) times daily with a meal. 180 tablet 1   fluticasone (FLONASE) 50 MCG/ACT nasal spray USE 2 SPRAYS IN EACH NOSTRIL EVERY DAY 48 g 1   isosorbide mononitrate (IMDUR) 30 MG 24 hr tablet Take 1 tablet (30 mg total) by mouth daily. 90 tablet 1   linaclotide (LINZESS) 145 MCG CAPS capsule Take 1 capsule (145 mcg total) by mouth daily before breakfast. 30 capsule 5   losartan (COZAAR) 100 MG tablet TAKE 1 TABLET BY MOUTH EVERY DAY 90 tablet 0   methocarbamol (ROBAXIN) 500 MG tablet  Take 1 tablet (500 mg total) by mouth 2 (two) times daily as needed.  2   nitroGLYCERIN (NITROSTAT) 0.4 MG SL tablet Place 1 tablet (0.4 mg total) under the tongue every 5 (five) minutes as needed for chest pain (CP or SOB). 25 tablet 3   ondansetron (ZOFRAN) 4 MG tablet Take 1 tablet (4 mg total) by mouth every 6 (six) hours as needed for nausea or vomiting. 20 tablet 0   oxyCODONE-acetaminophen (PERCOCET) 7.5-325 MG tablet Take 1 tablet by mouth at bedtime as needed. 30 tablet 0   pantoprazole (PROTONIX) 40 MG tablet Take 1 tablet (40 mg total) by mouth daily. 180 tablet 3   potassium chloride SA (KLOR-CON M20) 20 MEQ tablet TAKE 1 TABLET BY MOUTH TWICE A DAY 180 tablet 0   rosuvastatin (CRESTOR) 20 MG tablet Take 1 tablet (20 mg total) by mouth daily. 30 tablet 3   VENTOLIN HFA 108 (90 Base) MCG/ACT inhaler INHALE 2 PUFFS INTO THE LUNGS EVERY 6 HOURS AS NEEDED FOR WHEEZING OR SHORTNESS OF BREATH. 18 g 0   gabapentin (NEURONTIN) 300 MG capsule TAKE 1 CAPSULE BY MOUTH DAILY IN THE MORNING, 1 AT NOON AND 2 AT BEDTIME. 360 capsule 0   No facility-administered medications prior to visit.    Allergies  Allergen Reactions   Shrimp [Shellfish Allergy] Itching    Rash and lips itching   Ace Inhibitors Cough    Cough   Aspirin Nausea And Vomiting   Azithromycin Rash    Review of Systems  Musculoskeletal:        (+)  hip pain       Objective:    Physical Exam Constitutional:      General: She is not in acute distress.    Appearance: Normal appearance. She is not ill-appearing.  HENT:     Head: Normocephalic and atraumatic.     Right Ear: External ear normal.     Left Ear: External ear normal.  Eyes:     Extraocular Movements: Extraocular movements intact.     Pupils: Pupils are equal, round, and reactive to light.  Cardiovascular:     Rate and Rhythm: Normal rate and regular rhythm.     Heart sounds: Normal heart sounds. No murmur heard.    No gallop.     Comments: Blood pressure  measured 142/70 during manual recheck Pulmonary:     Effort: Pulmonary effort is normal. No respiratory distress.     Breath sounds: Normal breath sounds. No wheezing or rales.  Skin:    General: Skin is warm and dry.  Neurological:     Mental Status: She is alert and oriented to person, place, and time.  Psychiatric:        Judgment: Judgment normal.     BP (!) 142/70   Pulse 62   Temp 98.4 F (36.9 C) (Oral)   Resp 16   Wt 181 lb (82.1 kg)   LMP 11/25/1994   SpO2 100%   BMI 29.21 kg/m  Wt Readings from Last 3 Encounters:  12/02/22 181 lb (82.1 kg)  09/02/22 189 lb (85.7 kg)  05/29/22 189 lb (85.7 kg)       Assessment & Plan:  Primary hypertension Assessment & Plan: BP Readings from Last 3 Encounters:  12/02/22 (!) 142/70  09/04/22 (!) 146/81  09/02/22 137/80   On losartan and carvedilol. Initial bp was quite high. Repeat BP was only slightly elevated. Will hold off of med adjustments at this time. She is having a lot of hip pain which she believes is causing this pain.  Orders: -     Comprehensive metabolic panel  Hyperlipidemia LDL goal <70 Assessment & Plan: Lab Results  Component Value Date   CHOL 142 05/20/2022   HDL 44.90 05/20/2022   LDLCALC 78 05/20/2022   LDLDIRECT 175.0 08/10/2018   TRIG 93.0 05/20/2022   CHOLHDL 3 05/20/2022   LDL stable on crestor.    Tobacco abuse Assessment & Plan: Motivated to quit, declines chantix. Plans cold [place].   Other orders -     Gabapentin; TAKE 1 CAPSULE BY MOUTH DAILY IN THE MORNING, 1 AT NOON AND 2 AT BEDTIME.  Dispense: 360 capsule; Refill: 1    I, Nance Pear, NP, personally preformed the services described in this documentation.  All medical record entries made by the scribe were at my direction and in my presence.  I have reviewed the chart and discharge instructions (if applicable) and agree that the record reflects my personal performance and is accurate and complete.  12/02/2022   I,Shehryar Baig,acting as a Education administrator for Nance Pear, NP.,have documented all relevant documentation on the behalf of Nance Pear, NP,as directed by  Nance Pear, NP while in the presence of Nance Pear, NP.   Nance Pear, NP

## 2022-12-02 NOTE — Assessment & Plan Note (Addendum)
Motivated to quit, declines chantix. Plans cold [place].

## 2022-12-02 NOTE — Assessment & Plan Note (Addendum)
BP Readings from Last 3 Encounters:  12/02/22 (!) 142/70  09/04/22 (!) 146/81  09/02/22 137/80   On losartan and carvedilol. Initial bp was quite high. Repeat BP was only slightly elevated. Will hold off of med adjustments at this time. She is having a lot of hip pain which she believes is causing this pain.

## 2022-12-02 NOTE — Assessment & Plan Note (Signed)
Lab Results  Component Value Date   CHOL 142 05/20/2022   HDL 44.90 05/20/2022   LDLCALC 78 05/20/2022   LDLDIRECT 175.0 08/10/2018   TRIG 93.0 05/20/2022   CHOLHDL 3 05/20/2022   LDL stable on crestor.

## 2022-12-23 DIAGNOSIS — M1611 Unilateral primary osteoarthritis, right hip: Secondary | ICD-10-CM | POA: Diagnosis not present

## 2023-01-03 DIAGNOSIS — E785 Hyperlipidemia, unspecified: Secondary | ICD-10-CM | POA: Diagnosis not present

## 2023-01-03 DIAGNOSIS — I2510 Atherosclerotic heart disease of native coronary artery without angina pectoris: Secondary | ICD-10-CM | POA: Diagnosis not present

## 2023-01-03 DIAGNOSIS — I10 Essential (primary) hypertension: Secondary | ICD-10-CM | POA: Diagnosis not present

## 2023-01-03 DIAGNOSIS — Z720 Tobacco use: Secondary | ICD-10-CM | POA: Diagnosis not present

## 2023-01-03 DIAGNOSIS — I255 Ischemic cardiomyopathy: Secondary | ICD-10-CM | POA: Diagnosis not present

## 2023-01-03 DIAGNOSIS — Z01810 Encounter for preprocedural cardiovascular examination: Secondary | ICD-10-CM | POA: Diagnosis not present

## 2023-01-04 DIAGNOSIS — I444 Left anterior fascicular block: Secondary | ICD-10-CM | POA: Diagnosis not present

## 2023-01-04 DIAGNOSIS — I2510 Atherosclerotic heart disease of native coronary artery without angina pectoris: Secondary | ICD-10-CM | POA: Diagnosis not present

## 2023-01-04 DIAGNOSIS — I517 Cardiomegaly: Secondary | ICD-10-CM | POA: Diagnosis not present

## 2023-01-17 ENCOUNTER — Other Ambulatory Visit: Payer: Self-pay | Admitting: Family

## 2023-01-31 DIAGNOSIS — I10 Essential (primary) hypertension: Secondary | ICD-10-CM | POA: Diagnosis not present

## 2023-01-31 DIAGNOSIS — I2510 Atherosclerotic heart disease of native coronary artery without angina pectoris: Secondary | ICD-10-CM | POA: Diagnosis not present

## 2023-01-31 DIAGNOSIS — Z720 Tobacco use: Secondary | ICD-10-CM | POA: Diagnosis not present

## 2023-01-31 DIAGNOSIS — I255 Ischemic cardiomyopathy: Secondary | ICD-10-CM | POA: Diagnosis not present

## 2023-01-31 DIAGNOSIS — Z01810 Encounter for preprocedural cardiovascular examination: Secondary | ICD-10-CM | POA: Diagnosis not present

## 2023-01-31 DIAGNOSIS — E785 Hyperlipidemia, unspecified: Secondary | ICD-10-CM | POA: Diagnosis not present

## 2023-02-03 DIAGNOSIS — M1611 Unilateral primary osteoarthritis, right hip: Secondary | ICD-10-CM | POA: Diagnosis not present

## 2023-02-17 ENCOUNTER — Other Ambulatory Visit (HOSPITAL_BASED_OUTPATIENT_CLINIC_OR_DEPARTMENT_OTHER): Payer: Self-pay

## 2023-02-17 ENCOUNTER — Ambulatory Visit (INDEPENDENT_AMBULATORY_CARE_PROVIDER_SITE_OTHER): Payer: Medicare Other | Admitting: Family

## 2023-02-17 ENCOUNTER — Other Ambulatory Visit: Payer: Self-pay

## 2023-02-17 ENCOUNTER — Ambulatory Visit (HOSPITAL_BASED_OUTPATIENT_CLINIC_OR_DEPARTMENT_OTHER)
Admission: RE | Admit: 2023-02-17 | Discharge: 2023-02-17 | Disposition: A | Discharge status: Home / Self Care | Payer: Medicare Other | Source: Ambulatory Visit | Attending: Family | Admitting: Family

## 2023-02-17 ENCOUNTER — Encounter: Payer: Self-pay | Admitting: Family

## 2023-02-17 VITALS — BP 125/59 | HR 71 | Temp 98.6°F | Resp 16 | Wt 180.0 lb

## 2023-02-17 DIAGNOSIS — Z01818 Encounter for other preprocedural examination: Secondary | ICD-10-CM

## 2023-02-17 DIAGNOSIS — E119 Type 2 diabetes mellitus without complications: Secondary | ICD-10-CM

## 2023-02-17 DIAGNOSIS — J309 Allergic rhinitis, unspecified: Secondary | ICD-10-CM

## 2023-02-17 DIAGNOSIS — I10 Essential (primary) hypertension: Secondary | ICD-10-CM

## 2023-02-17 DIAGNOSIS — I2510 Atherosclerotic heart disease of native coronary artery without angina pectoris: Secondary | ICD-10-CM

## 2023-02-17 LAB — CBC WITH DIFFERENTIAL/PLATELET
Basophils Absolute: 0.1 10*3/uL (ref 0.0–0.1)
Basophils Relative: 0.6 % (ref 0.0–3.0)
Eosinophils Absolute: 0.1 10*3/uL (ref 0.0–0.7)
Eosinophils Relative: 1 % (ref 0.0–5.0)
HCT: 39.4 % (ref 36.0–46.0)
Hemoglobin: 13 g/dL (ref 12.0–15.0)
Lymphocytes Relative: 30.3 % (ref 12.0–46.0)
Lymphs Abs: 2.7 10*3/uL (ref 0.7–4.0)
MCHC: 32.9 g/dL (ref 30.0–36.0)
MCV: 82.2 fl (ref 78.0–100.0)
Monocytes Absolute: 0.9 10*3/uL (ref 0.1–1.0)
Monocytes Relative: 9.5 % (ref 3.0–12.0)
Neutro Abs: 5.3 10*3/uL (ref 1.4–7.7)
Neutrophils Relative %: 58.6 % (ref 43.0–77.0)
Platelets: 349 10*3/uL (ref 150.0–400.0)
RBC: 4.8 Mil/uL (ref 3.87–5.11)
RDW: 15.1 % (ref 11.5–15.5)
WBC: 9 10*3/uL (ref 4.0–10.5)

## 2023-02-17 LAB — COMPREHENSIVE METABOLIC PANEL
ALT: 7 U/L (ref 0–35)
AST: 10 U/L (ref 0–37)
Albumin: 4 g/dL (ref 3.5–5.2)
Alkaline Phosphatase: 78 U/L (ref 39–117)
BUN: 11 mg/dL (ref 6–23)
CO2: 25 mEq/L (ref 19–32)
Calcium: 9.2 mg/dL (ref 8.4–10.5)
Chloride: 105 mEq/L (ref 96–112)
Creatinine, Ser: 0.89 mg/dL (ref 0.40–1.20)
GFR: 66.56 mL/min (ref >60.00)
Glucose, Bld: 86 mg/dL (ref 70–99)
Potassium: 4.1 mEq/L (ref 3.5–5.1)
Sodium: 139 mEq/L (ref 135–145)
Total Bilirubin: 0.4 mg/dL (ref 0.2–1.2)
Total Protein: 6.8 g/dL (ref 6.0–8.3)

## 2023-02-17 LAB — PROTIME-INR
INR: 1 ratio (ref 0.8–1.0)
Prothrombin Time: 11.5 s (ref 9.6–13.1)

## 2023-02-17 MED ORDER — AZELASTINE HCL 0.1 % NA SOLN
2.0000 | Freq: Two times a day (BID) | NASAL | 12 refills | Status: DC
  Filled 2023-02-17: qty 30, 50d supply, fill #0

## 2023-02-17 MED ORDER — FLUTICASONE PROPIONATE 50 MCG/ACT NA SUSP: 2.0000 | Freq: Every day | NASAL | 6 refills | Status: DC

## 2023-02-17 NOTE — Assessment & Plan Note (Signed)
Lab Results  Component Value Date   HGBA1C 6.6 (H) 05/20/2022   HGBA1C 6.7 (H) 06/13/2021   HGBA1C 6.4 04/25/2020   Lab Results  Component Value Date   MICROALBUR 1.8 09/02/2022   LDLCALC 78 05/20/2022   CREATININE 0.84 12/02/2022   A1C is acceptable for surgery.

## 2023-02-17 NOTE — Assessment & Plan Note (Signed)
She will need cardiac clearance for upcoming surgery.  She is waiting for cardiology to contact her back about scheduling her pre-op stress test.

## 2023-02-17 NOTE — Patient Instructions (Addendum)
Please add azelastine spray for allergies.  Continue Zyrtec.  Start azelastine/fluticasone nasal spray.

## 2023-02-17 NOTE — Assessment & Plan Note (Signed)
Improved. Hydralazine was increased by cardiology.

## 2023-02-17 NOTE — Assessment & Plan Note (Signed)
EKG is performed today and notes NSR.  Note is made of TWI noted lead II, lead III, V3-V6.  Compared to previous EKG on file and it appears unchanged (09/04/22).

## 2023-02-17 NOTE — Progress Notes (Signed)
Subjective:   By signing my name below, I, Shehryar Baig, attest that this documentation has been prepared under the direction and in the presence of Debbrah Alar, NP. 02/17/2023   Patient ID: Jade [name], female    DOB: 04-Nov-1956, 67 y.o.   MRN: [id number]  Chief Complaint  Patient presents with   Pre-op Exam    Here for pre-op evaluation    HPI Patient is in today for a pre-op visit.   Pre-op: She is having an upcomming right hip replacement procedure. She is also meeting with her cardiologist for pre-op clearance. She denies having any chest pain or shortness of breathe, burning or frequency, asthma like symptoms.   Stress test: She reports having an upcomming stress test at her cardiology office but was delayed due to the office transitioning systems. She has not been reached out again for her appointment.   Blood pressure: Her blood pressure was elevated during her last cardiologist appointment and she was put on hydralazine. She notes her blood pressure was elevated due to taking OTC decongestants at that time. She continues taking 100 mg Losartan daily PO, 12.5 mg carvedilol daily PO and reports no new issues while taking them.  BP Readings from Last 3 Encounters:  02/17/23 (!) 125/59  12/02/22 (!) 142/70  09/04/22 (!) 146/81   Pulse Readings from Last 3 Encounters:  02/17/23 71  12/02/22 62  09/04/22 60   Congestion: She continues having clear nasal drainage. She is taking decongestants and Flonase nasal spray to manage her symptoms. She denies having any fevers. She has tried allergy medications in the past but found no change in her symptoms. She is also recommended to take delsym to help manage her symptoms.    Past Medical History:  Diagnosis Date   Abnormal EKG 07/16/2011   ACS (acute coronary syndrome) ([place]) 10/22/2019   Benign microscopic hematuria    neg cystoscopy 11/12- dr. Janice Norrie   DJD (degenerative joint disease)    L KNEE   Family history of  anesthesia complication    multiple family members with history of this   GERD (gastroesophageal reflux disease)    Hurthle cell neoplasm of thyroid    Hyperlipidemia LDL goal <70 10/23/2019   Hypertension    Hypertensive retinopathy    Malignant hyperthermia    NSTEMI (non-ST elevated myocardial infarction) ([place]) 10/22/2019   Numbness and tingling in left arm     Past Surgical History:  Procedure Laterality Date   ABDOMINAL HYSTERECTOMY  1996   BREAST EXCISIONAL BIOPSY Left 01/2016   BREAST EXCISIONAL BIOPSY Left 03/2016   CERVICAL DISC SURGERY  2013   Dr. Arnoldo Morale   COLONOSCOPY  2010   in [place], [place]-normal exam   CORONARY BALLOON ANGIOPLASTY N/A 10/25/2019   Procedure: [place];  Surgeon: Troy Sine, MD;  Location: [facility];  Service: Cardiovascular;  Laterality: N/A;   CORONARY STENT INTERVENTION N/A 10/22/2019   Procedure: CORONARY STENT INTERVENTION;  Surgeon: Troy Sine, MD;  Location: [facility];  Service: Cardiovascular;  Laterality: N/A;  mid lad    CORONARY STENT INTERVENTION N/A 10/25/2019   Procedure: CORONARY STENT INTERVENTION;  Surgeon: Troy Sine, MD;  Location: [facility];  Service: Cardiovascular;  Laterality: N/A;   DILATION AND CURETTAGE OF UTERUS     KNEE CARTILAGE SURGERY  1999   right knee   LEFT HEART CATH AND CORONARY ANGIOGRAPHY N/A 10/22/2019   Procedure: LEFT HEART CATH  AND CORONARY ANGIOGRAPHY;  Surgeon: Troy Sine, MD;  Location: [facility];  Service: Cardiovascular;  Laterality: N/A;   ROTATOR CUFF REPAIR Right 05/25/2013   TOTAL KNEE ARTHROPLASTY Left 01/20/2014   Procedure: LEFT TOTAL KNEE ARTHROPLASTY;  Surgeon: Johnn Hai, MD;  Location: [facility];  Service: Orthopedics;  Laterality: Left;   TOTAL KNEE ARTHROPLASTY Right 01/19/2015   Procedure: RIGHT TOTAL KNEE ARTHROPLASTY;  Surgeon: Johnn Hai, MD;  Location: [facility];  Service: Orthopedics;  Laterality: Right;    TUBAL LIGATION      Family History  Problem Relation Age of Onset   Hypertension Mother    Hyperlipidemia Mother    Heart disease Father        CAD; stent at age 44   Diabetes Father    Hypertension Father    Hyperlipidemia Father    Cancer Father        prostate   Dementia Father    Osteoarthritis Sister    Hypertension Sister    COPD Sister    CAD Sister        s/p stent   Edema Sister        cause unknown   Osteoporosis Sister        bad hips, bed bound, has one leg amputated   Drug abuse Sister    Dementia Paternal Grandmother    Cancer Paternal Aunt        BREAST   Breast cancer Paternal Aunt    Colon cancer Neg Hx     Social History   Socioeconomic History   Marital status: Married    Spouse name: Not on file   Number of children: 3   Years of education: Not on file   Highest education level: Not on file  Occupational History   Not on file  Tobacco Use   Smoking status: Every Day    Packs/day: 0.50    Years: 39.00    Additional pack years: 0.00    Total pack years: 19.50    Types: Cigarettes    Last attempt to quit: 11/2019    Years since quitting: 3.2   Smokeless tobacco: Never  Vaping Use   Vaping Use: Never used  Substance and Sexual Activity   Alcohol use: No    Alcohol/week: 0.0 standard drinks of alcohol   Drug use: No   Sexual activity: Yes    Partners: Male  Other Topics Concern   Not on file  Social History Narrative   Regular exercise:  No   Caffeine Use:  2 cups coffee and 3-4 glasses of soda daily.   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   Social Determinants of Health   Financial Resource Strain: Low Risk  (09/06/2021)   Overall Financial Resource Strain (CARDIA)    Difficulty of Paying Living Expenses: Not hard at all  Food Insecurity: No Food Insecurity (09/06/2021)   Hunger Vital Sign    Worried About Running Out of Food in the Last Year: Never true     Ran Out of Food in the Last Year: Never true  Transportation Needs: No Transportation Needs (09/06/2021)   PRAPARE - Hydrologist (Medical): No    Lack of Transportation (Non-Medical): No  Physical Activity: Inactive (09/06/2021)   Exercise Vital Sign    Days of Exercise per Week: 0 days    Minutes of  Exercise per Session: 0 min  Stress: No Stress Concern Present (09/06/2021)   [place]    Feeling of Stress : Not at all  Social Connections: [place] (09/06/2021)   Social Connection and Isolation Panel [NHANES]    Frequency of Communication with Friends and Family: More than three times a week    Frequency of Social Gatherings with Friends and Family: More than three times a week    Attends Religious Services: More than 4 times per year    Active Member of [employer] or Organizations: Yes    Attends Music therapist: More than 4 times per year    Marital Status: Married  Human resources officer Violence: Not At Risk (09/06/2021)   Humiliation, Afraid, Rape, and Kick questionnaire    Fear of Current or Ex-Partner: No    Emotionally Abused: No    Physically Abused: No    Sexually Abused: No    Outpatient Medications Prior to Visit  Medication Sig Dispense Refill   aspirin 81 MG chewable tablet Chew 81 mg by mouth daily.     carvedilol (COREG) 12.5 MG tablet Take 1 tablet (12.5 mg total) by mouth 2 (two) times daily with a meal. 180 tablet 1   gabapentin (NEURONTIN) 300 MG capsule TAKE 1 CAPSULE BY MOUTH DAILY IN THE MORNING, 1 AT NOON AND 2 AT BEDTIME. 360 capsule 1   isosorbide mononitrate (IMDUR) 30 MG 24 hr tablet Take 1 tablet (30 mg total) by mouth daily. 90 tablet 1   linaclotide (LINZESS) 145 MCG CAPS capsule Take 1 capsule (145 mcg total) by mouth daily before breakfast. 30 capsule 5   losartan (COZAAR) 100 MG tablet TAKE 1 TABLET BY MOUTH EVERY DAY 90 tablet 0    methocarbamol (ROBAXIN) 500 MG tablet Take 1 tablet (500 mg total) by mouth 2 (two) times daily as needed.  2   nitroGLYCERIN (NITROSTAT) 0.4 MG SL tablet Place 1 tablet (0.4 mg total) under the tongue every 5 (five) minutes as needed for chest pain (CP or SOB). 25 tablet 3   ondansetron (ZOFRAN) 4 MG tablet Take 1 tablet (4 mg total) by mouth every 6 (six) hours as needed for nausea or vomiting. 20 tablet 0   oxyCODONE-acetaminophen (PERCOCET) 7.5-325 MG tablet Take 1 tablet by mouth at bedtime as needed. 30 tablet 0   pantoprazole (PROTONIX) 40 MG tablet Take 1 tablet (40 mg total) by mouth daily. 180 tablet 3   potassium chloride SA (KLOR-CON M20) 20 MEQ tablet TAKE 1 TABLET BY MOUTH TWICE A DAY 180 tablet 0   rosuvastatin (CRESTOR) 20 MG tablet Take 1 tablet (20 mg total) by mouth daily. 30 tablet 3   VENTOLIN HFA 108 (90 Base) MCG/ACT inhaler INHALE 2 PUFFS INTO THE LUNGS EVERY 6 HOURS AS NEEDED FOR WHEEZING OR SHORTNESS OF BREATH. 18 g 0   fluticasone (FLONASE) 50 MCG/ACT nasal spray USE 2 SPRAYS IN EACH NOSTRIL EVERY DAY 48 g 1   No facility-administered medications prior to visit.    Allergies  Allergen Reactions   Shrimp [Shellfish Allergy] Itching    Rash and lips itching   Ace Inhibitors Cough    Cough   Aspirin Nausea And Vomiting   Azithromycin Rash    Review of Systems  Respiratory:  Negative for shortness of breath and wheezing.   Cardiovascular:  Negative for chest pain.  Genitourinary:  Negative for dysuria and frequency.  Objective:    Physical Exam Constitutional:      General: She is not in acute distress.    Appearance: Normal appearance. She is not ill-appearing.  HENT:     Head: Normocephalic and atraumatic.     Right Ear: External ear normal.     Left Ear: External ear normal.  Eyes:     Extraocular Movements: Extraocular movements intact.     Pupils: Pupils are equal, round, and reactive to light.  Cardiovascular:     Rate and Rhythm: Normal  rate and regular rhythm.     Heart sounds: Murmur heard.     Systolic murmur is present with a grade of 1/6.     No gallop.  Pulmonary:     Effort: Pulmonary effort is normal. No respiratory distress.     Breath sounds: Normal breath sounds. No wheezing or rales.  Skin:    General: Skin is warm and dry.  Neurological:     Mental Status: She is alert and oriented to person, place, and time.  Psychiatric:        Judgment: Judgment normal.     BP (!) 125/59 (BP Location: Right Arm, Patient Position: Sitting, Cuff Size: Small)   Pulse 71   Temp 98.6 F (37 C) (Oral)   Resp 16   Wt 180 lb (81.6 kg)   LMP 11/25/1994   SpO2 100%   BMI 29.05 kg/m  Wt Readings from Last 3 Encounters:  02/17/23 180 lb (81.6 kg)  12/02/22 181 lb (82.1 kg)  09/02/22 189 lb (85.7 kg)       Assessment & Plan:  Primary hypertension Assessment & Plan: Improved. Hydralazine was increased by cardiology.    Orders: -     Comprehensive metabolic panel  Preoperative evaluation to rule out surgical contraindication Assessment & Plan: EKG is performed today and notes NSR.  Note is made of TWI noted lead II, lead III, V3-V6.  Compared to previous EKG on file and it appears unchanged (09/04/22).  Orders: -     CBC with Differential/Platelet -     Protime-INR -     EKG 12-Lead -     DG Chest 2 View; Future  CAD in native artery s/p DES LAD, DES RCA 10/26/19 Assessment & Plan: She will need cardiac clearance for upcoming surgery.  She is waiting for cardiology to contact her back about scheduling her pre-op stress test.     Controlled type 2 diabetes mellitus without complication, without long-term current use of insulin [facility]) Assessment & Plan: Lab Results  Component Value Date   HGBA1C 6.6 (H) 05/20/2022   HGBA1C 6.7 (H) 06/13/2021   HGBA1C 6.4 04/25/2020   Lab Results  Component Value Date   MICROALBUR 1.8 09/02/2022   LDLCALC 78 05/20/2022   CREATININE 0.84 12/02/2022   A1C is acceptable  for surgery.    Allergic rhinitis, unspecified seasonality, unspecified trigger Assessment & Plan: No improvement with flonase or zyrtec.   Will add azelastine spray. She is advised that she may use delsym as needed for cough.    Other orders -     Fluticasone Propionate; Place 2 sprays into both nostrils daily.  Dispense: 16 g; Refill: 6 -     Azelastine HCl; Place 2 sprays into both nostrils 2 (two) times daily. Use in each nostril as directed  Dispense: 30 mL; Refill: 12    I, Nance Pear, NP, personally preformed the services described in this documentation.  All medical record  entries made by the scribe were at my direction and in my presence.  I have reviewed the chart and discharge instructions (if applicable) and agree that the record reflects my personal performance and is accurate and complete. 02/17/2023   I,Shehryar Baig,acting as a Education administrator for Nance Pear, NP.,have documented all relevant documentation on the behalf of Nance Pear, NP,as directed by  Nance Pear, NP while in the presence of Nance Pear, NP.   Nance Pear, NP

## 2023-02-17 NOTE — Assessment & Plan Note (Addendum)
No improvement with flonase or zyrtec.   Will add azelastine spray. She is advised that she may use delsym as needed for cough.

## 2023-02-20 ENCOUNTER — Other Ambulatory Visit: Payer: Self-pay

## 2023-02-20 ENCOUNTER — Other Ambulatory Visit (INDEPENDENT_AMBULATORY_CARE_PROVIDER_SITE_OTHER): Payer: Medicare Other

## 2023-02-20 DIAGNOSIS — E119 Type 2 diabetes mellitus without complications: Secondary | ICD-10-CM

## 2023-02-20 LAB — HEMOGLOBIN A1C: Hgb A1c MFr Bld: 6.5 % (ref 4.6–6.5)

## 2023-02-22 ENCOUNTER — Encounter: Payer: Self-pay | Admitting: Family

## 2023-02-24 NOTE — Progress Notes (Signed)
Mailed out to patient 

## 2023-02-27 ENCOUNTER — Telehealth: Payer: Self-pay | Admitting: Family

## 2023-02-27 NOTE — Telephone Encounter (Signed)
Copied from [place] [id number]. Topic: Medicare AWV >> Feb 27, 2023 11:37 AM Devoria Glassing wrote: Reason for CRM: Called patient to schedule Medicare Annual Wellness Visit (AWV). Left message for patient to call back and schedule Medicare Annual Wellness Visit (AWV).  Last date of AWV: 09/05/21  Please schedule an appointment at any time with Beatris Ship, [place]  .  If any questions, please contact me.  Thank you ,  Sherol Dade; [place] Direct Dial: [phone]

## 2023-03-03 ENCOUNTER — Ambulatory Visit: Payer: Medicare Other | Admitting: Family

## 2023-03-11 ENCOUNTER — Other Ambulatory Visit (HOSPITAL_BASED_OUTPATIENT_CLINIC_OR_DEPARTMENT_OTHER): Payer: Self-pay

## 2023-03-11 ENCOUNTER — Ambulatory Visit (INDEPENDENT_AMBULATORY_CARE_PROVIDER_SITE_OTHER): Payer: Medicare Other | Admitting: Family

## 2023-03-11 VITALS — BP 132/73 | HR 74 | Temp 98.0°F | Resp 16 | Wt 184.0 lb

## 2023-03-11 DIAGNOSIS — N6452 Nipple discharge: Secondary | ICD-10-CM

## 2023-03-11 DIAGNOSIS — Z1231 Encounter for screening mammogram for malignant neoplasm of breast: Secondary | ICD-10-CM

## 2023-03-11 DIAGNOSIS — J019 Acute sinusitis, unspecified: Secondary | ICD-10-CM | POA: Diagnosis not present

## 2023-03-11 MED ORDER — AMOXICILLIN-POT CLAVULANATE 875-125 MG PO TABS
1.0000 | ORAL_TABLET | Freq: Two times a day (BID) | ORAL | 0 refills | Status: DC
  Filled 2023-03-11: qty 20, 10d supply, fill #0

## 2023-03-11 NOTE — Assessment & Plan Note (Signed)
New. Will rx with Augmentin. Advised pt OK to continue zyrtec and flonase.

## 2023-03-11 NOTE — Progress Notes (Signed)
Subjective:   By signing my name below, I, Jade [name], attest that this documentation has been prepared under the direction and in the presence of Sandford Craze, NP.  03/11/2023.   Patient ID: Jade [name], female    DOB: 03-09-1956, 67 y.o.   MRN: [id number]  Chief Complaint  Patient presents with   Sinus Problem    Complains of sinus pain and pressure   Breast Problem    Patient reports discharge from left nipple    HPI Patient is in today for an office visit.  Allergic rhinitis:  At her last visit 01/2023 she reported no improvement in her symptoms with Flonase or zyrtec. We added azelastine spray and advised her to use delsym as needed for cough. She still has not noticed much improvement in her symptoms. Today she complains of sinus pressure, cough, nasal congestion, and fatigue ongoing for 2 weeks.   Discharge of nipple:  Additionally she complains of milky, mildly malodorous left nipple drainage. She only notices it when she pinches her nipple. Initially, she had developed soreness and heaviness of her left nipple. She has had extensive evaluation of this in the past including open drainage and excision of chronic subareolar inflammation. It was decided at that time that this is a chronic issue for her that did not represent breast cancer and no further work up would be indicated. She is up to date on her mammogram. It will be due again in July.   Past Medical History:  Diagnosis Date   Abnormal EKG 07/16/2011   ACS (acute coronary syndrome) (HCC) 10/22/2019   Benign microscopic hematuria    neg cystoscopy 11/12- dr. Brunilda Payor   DJD (degenerative joint disease)    L KNEE   Family history of anesthesia complication    multiple family members with history of this   GERD (gastroesophageal reflux disease)    Hurthle cell neoplasm of thyroid    Hyperlipidemia LDL goal <70 10/23/2019   Hypertension    Hypertensive retinopathy    Malignant hyperthermia    NSTEMI (non-ST  elevated myocardial infarction) (HCC) 10/22/2019   Numbness and tingling in left arm     Past Surgical History:  Procedure Laterality Date   ABDOMINAL HYSTERECTOMY  1996   BREAST EXCISIONAL BIOPSY Left 01/2016   BREAST EXCISIONAL BIOPSY Left 03/2016   CERVICAL DISC SURGERY  2013   Dr. Lovell Sheehan   COLONOSCOPY  2010   in [place], [place]-normal exam   CORONARY BALLOON ANGIOPLASTY N/A 10/25/2019   Procedure: CORONARY BALLOON ANGIOPLASTY;  Surgeon: Lennette Bihari, MD;  Location: [facility];  Service: Cardiovascular;  Laterality: N/A;   CORONARY STENT INTERVENTION N/A 10/22/2019   Procedure: CORONARY STENT INTERVENTION;  Surgeon: Lennette Bihari, MD;  Location: [facility];  Service: Cardiovascular;  Laterality: N/A;  mid lad    CORONARY STENT INTERVENTION N/A 10/25/2019   Procedure: CORONARY STENT INTERVENTION;  Surgeon: Lennette Bihari, MD;  Location: [facility];  Service: Cardiovascular;  Laterality: N/A;   DILATION AND CURETTAGE OF UTERUS     KNEE CARTILAGE SURGERY  1999   right knee   LEFT HEART CATH AND CORONARY ANGIOGRAPHY N/A 10/22/2019   Procedure: LEFT HEART CATH AND CORONARY ANGIOGRAPHY;  Surgeon: Lennette Bihari, MD;  Location: [facility];  Service: Cardiovascular;  Laterality: N/A;   ROTATOR CUFF REPAIR Right 05/25/2013   TOTAL KNEE ARTHROPLASTY Left 01/20/2014   Procedure: LEFT TOTAL KNEE ARTHROPLASTY;  Surgeon: Tinnie Gens  Connye Burkitt, MD;  Location: [facility];  Service: Orthopedics;  Laterality: Left;   TOTAL KNEE ARTHROPLASTY Right 01/19/2015   Procedure: RIGHT TOTAL KNEE ARTHROPLASTY;  Surgeon: Javier Docker, MD;  Location: [facility];  Service: Orthopedics;  Laterality: Right;   TUBAL LIGATION      Family History  Problem Relation Age of Onset   Hypertension Mother    Hyperlipidemia Mother    Heart disease Father        CAD; stent at age 28   Diabetes Father    Hypertension Father    Hyperlipidemia Father    Cancer Father        prostate    Dementia Father    Osteoarthritis Sister    Hypertension Sister    COPD Sister    CAD Sister        s/p stent   Edema Sister        cause unknown   Osteoporosis Sister        bad hips, bed bound, has one leg amputated   Drug abuse Sister    Dementia Paternal Grandmother    Cancer Paternal Aunt        BREAST   Breast cancer Paternal Aunt    Colon cancer Neg Hx     Social History   Socioeconomic History   Marital status: Married    Spouse name: Not on file   Number of children: 3   Years of education: Not on file   Highest education level: Not on file  Occupational History   Not on file  Tobacco Use   Smoking status: Every Day    Packs/day: 0.50    Years: 39.00    Additional pack years: 0.00    Total pack years: 19.50    Types: Cigarettes    Last attempt to quit: 11/2019    Years since quitting: 3.2   Smokeless tobacco: Never  Vaping Use   Vaping Use: Never used  Substance and Sexual Activity   Alcohol use: No    Alcohol/week: 0.0 standard drinks of alcohol   Drug use: No   Sexual activity: Yes    Partners: Male  Other Topics Concern   Not on file  Social History Narrative   Regular exercise:  No   Caffeine Use:  2 cups coffee and 3-4 glasses of soda daily.   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   Social Determinants of Health   Financial Resource Strain: Low Risk  (09/06/2021)   Overall Financial Resource Strain (CARDIA)    Difficulty of Paying Living Expenses: Not hard at all  Food Insecurity: No Food Insecurity (09/06/2021)   Hunger Vital Sign    Worried About Running Out of Food in the Last Year: Never true    Ran Out of Food in the Last Year: Never true  Transportation Needs: No Transportation Needs (09/06/2021)   PRAPARE - Administrator, Civil Service (Medical): No    Lack of Transportation (Non-Medical): No  Physical Activity: Inactive (09/06/2021)   Exercise Vital Sign     Days of Exercise per Week: 0 days    Minutes of Exercise per Session: 0 min  Stress: No Stress Concern Present (09/06/2021)   [employer] of Occupational Health - Occupational Stress Questionnaire    Feeling of Stress : Not at all  Social Connections: Socially Integrated (09/06/2021)   Social Connection and Isolation  Panel [NHANES]    Frequency of Communication with Friends and Family: More than three times a week    Frequency of Social Gatherings with Friends and Family: More than three times a week    Attends Religious Services: More than 4 times per year    Active Member of [employer] or Organizations: Yes    Attends Engineer, structural: More than 4 times per year    Marital Status: Married  Catering manager Violence: Not At Risk (09/06/2021)   Humiliation, Afraid, Rape, and Kick questionnaire    Fear of Current or Ex-Partner: No    Emotionally Abused: No    Physically Abused: No    Sexually Abused: No    Outpatient Medications Prior to Visit  Medication Sig Dispense Refill   aspirin 81 MG chewable tablet Chew 81 mg by mouth daily.     azelastine (ASTELIN) 0.1 % nasal spray Place 2 sprays into both nostrils 2 (two) times daily. Use in each nostril as directed 30 mL 12   carvedilol (COREG) 12.5 MG tablet Take 1 tablet (12.5 mg total) by mouth 2 (two) times daily with a meal. 180 tablet 1   fluticasone (FLONASE) 50 MCG/ACT nasal spray Place 2 sprays into both nostrils daily. 16 g 6   gabapentin (NEURONTIN) 300 MG capsule TAKE 1 CAPSULE BY MOUTH DAILY IN THE MORNING, 1 AT NOON AND 2 AT BEDTIME. 360 capsule 1   isosorbide mononitrate (IMDUR) 30 MG 24 hr tablet Take 1 tablet (30 mg total) by mouth daily. 90 tablet 1   linaclotide (LINZESS) 145 MCG CAPS capsule Take 1 capsule (145 mcg total) by mouth daily before breakfast. 30 capsule 5   losartan (COZAAR) 100 MG tablet TAKE 1 TABLET BY MOUTH EVERY DAY 90 tablet 0   methocarbamol (ROBAXIN) 500 MG tablet Take 1 tablet (500  mg total) by mouth 2 (two) times daily as needed.  2   nitroGLYCERIN (NITROSTAT) 0.4 MG SL tablet Place 1 tablet (0.4 mg total) under the tongue every 5 (five) minutes as needed for chest pain (CP or SOB). 25 tablet 3   ondansetron (ZOFRAN) 4 MG tablet Take 1 tablet (4 mg total) by mouth every 6 (six) hours as needed for nausea or vomiting. 20 tablet 0   oxyCODONE-acetaminophen (PERCOCET) 7.5-325 MG tablet Take 1 tablet by mouth at bedtime as needed. 30 tablet 0   pantoprazole (PROTONIX) 40 MG tablet Take 1 tablet (40 mg total) by mouth daily. 180 tablet 3   potassium chloride SA (KLOR-CON M20) 20 MEQ tablet TAKE 1 TABLET BY MOUTH TWICE A DAY 180 tablet 0   rosuvastatin (CRESTOR) 20 MG tablet Take 1 tablet (20 mg total) by mouth daily. 30 tablet 3   VENTOLIN HFA 108 (90 Base) MCG/ACT inhaler INHALE 2 PUFFS INTO THE LUNGS EVERY 6 HOURS AS NEEDED FOR WHEEZING OR SHORTNESS OF BREATH. 18 g 0   No facility-administered medications prior to visit.    Allergies  Allergen Reactions   Shrimp [Shellfish Allergy] Itching    Rash and lips itching   Ace Inhibitors Cough    Cough   Aspirin Nausea And Vomiting   Azithromycin Rash    Review of Systems  Constitutional:  Positive for malaise/fatigue.  HENT:  Positive for congestion and sinus pain (Pressure).   Respiratory:  Positive for cough.   Skin:        Left nipple discharge  See HPI.     Objective:    Physical Exam Exam conducted  with a chaperone present.  Constitutional:      Appearance: Normal appearance.  HENT:     Head: Normocephalic and atraumatic.     Right Ear: Tympanic membrane, ear canal and external ear normal.     Left Ear: Tympanic membrane, ear canal and external ear normal.     Nose:     Right Sinus: Maxillary sinus tenderness and frontal sinus tenderness present.     Left Sinus: Maxillary sinus tenderness and frontal sinus tenderness present.  Eyes:     Extraocular Movements: Extraocular movements intact.     Pupils:  Pupils are equal, round, and reactive to light.  Cardiovascular:     Rate and Rhythm: Normal rate and regular rhythm.     Heart sounds: Normal heart sounds. No murmur heard.    No gallop.  Pulmonary:     Effort: Pulmonary effort is normal. No respiratory distress.     Breath sounds: Normal breath sounds. No wheezing or rales.  Chest:  Breasts:    Right: Normal.     Left: Normal.     Comments: Scar tissue is noted of left breast at 11 o'clock. Patient is able to express opaque yellow/white fluid from left nipple with pinching Skin:    General: Skin is warm and dry.  Neurological:     General: No focal deficit present.     Mental Status: She is alert and oriented to person, place, and time.  Psychiatric:        Mood and Affect: Mood normal.        Behavior: Behavior normal.     BP 132/73 (BP Location: Right Arm, Patient Position: Sitting, Cuff Size: Small)   Pulse 74   Temp 98 F (36.7 C) (Oral)   Resp 16   Wt 184 lb (83.5 kg)   LMP 11/25/1994   SpO2 100%   BMI 29.70 kg/m  Wt Readings from Last 3 Encounters:  03/11/23 184 lb (83.5 kg)  02/17/23 180 lb (81.6 kg)  12/02/22 181 lb (82.1 kg)        Assessment & Plan:   Problem List Items Addressed This Visit       Unprioritized   Breast discharge    Recurrent/chronic. Normal breast exam. Recommended trial of augmentin. If symptoms worsen or if they fail to improve she will let me know.  Otherwise, will plan to repeat her routine mammogram in July.        Acute sinusitis    New. Will rx with Augmentin. Advised pt OK to continue zyrtec and flonase.       Relevant Medications   amoxicillin-clavulanate (AUGMENTIN) 875-125 MG tablet   Other Visit Diagnoses     Breast cancer screening by mammogram    -  Primary   Relevant Orders   MM 3D SCREENING MAMMOGRAM BILATERAL BREAST        Meds ordered this encounter  Medications   amoxicillin-clavulanate (AUGMENTIN) 875-125 MG tablet    Sig: Take 1 tablet by mouth 2  (two) times daily.    Dispense:  20 tablet    Refill:  0    Order Specific Question:   Supervising Provider    Answer:   Danise Edge A [[id number]]    I, Lemont Fillers, NP, personally preformed the services described in this documentation.  All medical record entries made by the scribe were at my direction and in my presence.  I have reviewed the chart and discharge instructions (if applicable) and agree that  the record reflects my personal performance and is accurate and complete. 03/11/2023.  I,Mathew Stumpf,acting as a Neurosurgeon for [employer], NP.,have documented all relevant documentation on the behalf of Lemont Fillers, NP,as directed by  Lemont Fillers, NP while in the presence of Lemont Fillers, NP.   Lemont Fillers, NP

## 2023-03-11 NOTE — Assessment & Plan Note (Signed)
Recurrent/chronic. Normal breast exam. Recommended trial of augmentin. If symptoms worsen or if they fail to improve she will let me know.  Otherwise, will plan to repeat her routine mammogram in July.

## 2023-03-17 DIAGNOSIS — M5416 Radiculopathy, lumbar region: Secondary | ICD-10-CM | POA: Diagnosis not present

## 2023-03-17 DIAGNOSIS — M5459 Other low back pain: Secondary | ICD-10-CM | POA: Diagnosis not present

## 2023-03-17 DIAGNOSIS — Z79891 Long term (current) use of opiate analgesic: Secondary | ICD-10-CM | POA: Diagnosis not present

## 2023-03-17 DIAGNOSIS — G894 Chronic pain syndrome: Secondary | ICD-10-CM | POA: Diagnosis not present

## 2023-03-31 ENCOUNTER — Other Ambulatory Visit: Payer: Self-pay | Admitting: Family

## 2023-03-31 NOTE — Progress Notes (Signed)
Sent message, via epic in basket, requesting orders in epic from surgeon.  

## 2023-04-02 NOTE — Patient Instructions (Addendum)
SURGICAL WAITING ROOM VISITATION Patients having surgery or a procedure may have no more than 2 support people in the waiting area - these visitors may rotate.    Children under the age of 7 must have an adult with them who is not the patient.  If the patient needs to stay at the hospital during part of their recovery, the visitor guidelines for inpatient rooms apply. Pre-op nurse will coordinate an appropriate time for 1 support person to accompany patient in pre-op.  This support person may not rotate.    Please refer to the [facility] website for the visitor guidelines for Inpatients (after your surgery is over and you are in a regular room).       Your procedure is scheduled on: 04-17-23   Report to [facility] Main Entrance    Report to admitting at 7:00 AM   Call this number if you have problems the morning of surgery [phone]   Do not eat food :After Midnight.   After Midnight you may have the following liquids until 6:30 AM DAY OF SURGERY  Water Non-Citrus Juices (without pulp, NO RED-Apple, White grape, White cranberry) Black Coffee (NO MILK/CREAM OR CREAMERS, sugar ok)  Clear Tea (NO MILK/CREAM OR CREAMERS, sugar ok) regular and decaf                             Plain Jell-O (NO RED)                                           Fruit ices (not with fruit pulp, NO RED)                                     Popsicles (NO RED)                                                               Sports drinks like Gatorade (NO RED)                   The day of surgery:  Drink ONE (1) Pre-Surgery G2 at 6:30 AM the morning of surgery. Drink in one sitting. Do not sip.  This drink was given to you during your hospital  pre-op appointment visit. Nothing else to drink after completing the Pre-Surgery G2.          If you have questions, please contact your surgeon's office.   FOLLOW  ANY ADDITIONAL PRE OP INSTRUCTIONS YOU RECEIVED FROM YOUR SURGEON'S OFFICE!!!      Oral Hygiene is also important to reduce your risk of infection.                                    Remember - BRUSH YOUR TEETH THE MORNING OF SURGERY WITH YOUR REGULAR TOOTHPASTE   Do NOT smoke after Midnight   Take these medicines the morning of surgery with A SIP OF WATER:   Carvedilol  Gabapentin  Hydralazine  Isosorbide  Pantroprazole  Rosuvastatin  Oxycodone if needed                               You may not have any metal on your body including hair pins, jewelry, and body piercing             Do not wear make-up, lotions, powders, perfumes, or deodorant  Do not wear nail polish including gel and S&S, artificial/acrylic nails, or any other type of covering on natural nails including finger and toenails. If you have artificial nails, gel coating, etc. that needs to be removed by a nail salon please have this removed prior to surgery or surgery may need to be canceled/ delayed if the surgeon/ anesthesia feels like they are unable to be safely monitored.   Do not shave  48 hours prior to surgery.      Do not bring valuables to the hospital. [facility] IS NOT RESPONSIBLE   FOR VALUABLES.   Contacts, dentures or bridgework may not be worn into surgery.   Bring small overnight bag day of surgery.   DO NOT BRING YOUR HOME MEDICATIONS TO THE HOSPITAL. PHARMACY WILL DISPENSE MEDICATIONS LISTED ON YOUR MEDICATION LIST TO YOU DURING YOUR ADMISSION IN THE HOSPITAL!     Special Instructions: Bring a copy of your healthcare power of attorney and living will documents the day of surgery if you haven't scanned them before.              Please read over the following fact sheets you were given: IF YOU HAVE QUESTIONS ABOUT YOUR PRE-OP INSTRUCTIONS PLEASE CALL [phone] Gwen  If you received a COVID test during your pre-op visit  it is requested that you wear a mask when out in public, stay away from anyone that may not be feeling well and notify your surgeon if you develop symptoms.  If you test positive for Covid or have been in contact with anyone that has tested positive in the last 10 days please notify you surgeon.     Pre-operative 5 CHG Bath Instructions   You can play a key role in reducing the risk of infection after surgery. Your skin needs to be as free of germs as possible. You can reduce the number of germs on your skin by washing with CHG (chlorhexidine gluconate) soap before surgery. CHG is an antiseptic soap that kills germs and continues to kill germs even after washing.   DO NOT use if you have an allergy to chlorhexidine/CHG or antibacterial soaps. If your skin becomes reddened or irritated, stop using the CHG and notify one of our RNs at  [phone] .   Please shower with the CHG soap starting 4 days before surgery using the following schedule:     Please keep in mind the following:  DO NOT shave, including legs and underarms, starting the day of your first shower.   You may shave your face at any point before/day of surgery.  Place clean sheets on your bed the day you start using CHG soap. Use a clean washcloth (not used since being washed) for each shower. DO NOT sleep with pets once you start using the CHG.   CHG Shower Instructions:  If you choose to wash your hair and private area, wash first with your normal shampoo/soap.  After you use shampoo/soap, rinse your hair and body thoroughly to remove shampoo/soap residue.  Turn the water OFF and  apply about 3 tablespoons (45 ml) of CHG soap to a CLEAN washcloth.  Apply CHG soap ONLY FROM YOUR NECK DOWN TO YOUR TOES (washing for 3-5 minutes)  DO NOT use CHG soap on face, private areas, open wounds, or sores.  Pay special attention to the area where your surgery is being performed.  If you are having back surgery, having someone wash your back for you may be helpful. Wait 2 minutes after CHG soap is applied, then you may rinse off the CHG soap.  Pat dry with a clean towel  Put on clean  clothes/pajamas   If you choose to wear lotion, please use ONLY the CHG-compatible lotions on the back of this paper.     Additional instructions for the day of surgery: DO NOT APPLY any lotions, deodorants, cologne, or perfumes.   Put on clean/comfortable clothes.  Brush your teeth.  Ask your nurse before applying any prescription medications to the skin.      CHG Compatible Lotions   Aveeno Moisturizing lotion  Cetaphil Moisturizing Cream  Cetaphil Moisturizing Lotion  Clairol Herbal Essence Moisturizing Lotion, Dry Skin  Clairol Herbal Essence Moisturizing Lotion, Extra Dry Skin  Clairol Herbal Essence Moisturizing Lotion, Normal Skin  Curel Age Defying Therapeutic Moisturizing Lotion with Alpha Hydroxy  Curel Extreme Care Body Lotion  Curel Soothing Hands Moisturizing Hand Lotion  Curel Therapeutic Moisturizing Cream, Fragrance-Free  Curel Therapeutic Moisturizing Lotion, Fragrance-Free  Curel Therapeutic Moisturizing Lotion, Original Formula  Eucerin Daily Replenishing Lotion  Eucerin Dry Skin Therapy Plus Alpha Hydroxy Crme  Eucerin Dry Skin Therapy Plus Alpha Hydroxy Lotion  Eucerin Original Crme  Eucerin Original Lotion  Eucerin Plus Crme Eucerin Plus Lotion  Eucerin TriLipid Replenishing Lotion  Keri Anti-Bacterial Hand Lotion  Keri Deep Conditioning Original Lotion Dry Skin Formula Softly Scented  Keri Deep Conditioning Original Lotion, Fragrance Free Sensitive Skin Formula  Keri Lotion Fast Absorbing Fragrance Free Sensitive Skin Formula  Keri Lotion Fast Absorbing Softly Scented Dry Skin Formula  Keri Original Lotion  Keri Skin Renewal Lotion Keri Silky Smooth Lotion  Keri Silky Smooth Sensitive Skin Lotion  Nivea Body Creamy Conditioning Oil  Nivea Body Extra Enriched Lotion  Nivea Body Original Lotion  Nivea Body Sheer Moisturizing Lotion Nivea Crme  Nivea Skin Firming Lotion  NutraDerm 30 Skin Lotion  NutraDerm Skin Lotion  NutraDerm Therapeutic  Skin Cream  NutraDerm Therapeutic Skin Lotion  ProShield Protective Hand Cream  Provon moisturizing lotion    Incentive Spirometer  An incentive spirometer is a tool that can help keep your lungs clear and active. This tool measures how well you are filling your lungs with each breath. Taking long deep breaths may help reverse or decrease the chance of developing breathing (pulmonary) problems (especially infection) following: A long period of time when you are unable to move or be active. BEFORE THE PROCEDURE  If the spirometer includes an indicator to show your best effort, your nurse or respiratory therapist will set it to a desired goal. If possible, sit up straight or lean slightly forward. Try not to slouch. Hold the incentive spirometer in an upright position. INSTRUCTIONS FOR USE  Sit on the edge of your bed if possible, or sit up as far as you can in bed or on a chair. Hold the incentive spirometer in an upright position. Breathe out normally. Place the mouthpiece in your mouth and seal your lips tightly around it. Breathe in slowly and as deeply as possible, raising the piston or the  ball toward the top of the column. Hold your breath for 3-5 seconds or for as long as possible. Allow the piston or ball to fall to the bottom of the column. Remove the mouthpiece from your mouth and breathe out normally. Rest for a few seconds and repeat Steps 1 through 7 at least 10 times every 1-2 hours when you are awake. Take your time and take a few normal breaths between deep breaths. The spirometer may include an indicator to show your best effort. Use the indicator as a goal to work toward during each repetition. After each set of 10 deep breaths, practice coughing to be sure your lungs are clear. If you have an incision (the cut made at the time of surgery), support your incision when coughing by placing a pillow or rolled up towels firmly against it. Once you are able to get out of bed, walk  around indoors and cough well. You may stop using the incentive spirometer when instructed by your caregiver.  RISKS AND COMPLICATIONS Take your time so you do not get dizzy or light-headed. If you are in pain, you may need to take or ask for pain medication before doing incentive spirometry. It is harder to take a deep breath if you are having pain. AFTER USE Rest and breathe slowly and easily. It can be helpful to keep track of a log of your progress. Your caregiver can provide you with a simple table to help with this. If you are using the spirometer at home, follow these instructions: SEEK MEDICAL CARE IF:  You are having difficultly using the spirometer. You have trouble using the spirometer as often as instructed. Your pain medication is not giving enough relief while using the spirometer. You develop fever of 100.5 F (38.1 C) or higher. SEEK IMMEDIATE MEDICAL CARE IF:  You cough up bloody sputum that had not been present before. You develop fever of 102 F (38.9 C) or greater. You develop worsening pain at or near the incision site. MAKE SURE YOU:  Understand these instructions. Will watch your condition. Will get help right away if you are not doing well or get worse. Document Released: 03/24/2007 Document Revised: 02/03/2012 Document Reviewed: 05/25/2007 ExitCare Patient Information 2014 ExitCare, [place].   ________________________________________________________________________ WHAT IS A BLOOD TRANSFUSION? Blood Transfusion Information  A transfusion is the replacement of blood or some of its parts. Blood is made up of multiple cells which provide different functions. Red blood cells carry oxygen and are used for blood loss replacement. White blood cells fight against infection. Platelets control bleeding. Plasma helps clot blood. Other blood products are available for specialized needs, such as hemophilia or other clotting disorders. BEFORE THE TRANSFUSION  Who gives  blood for transfusions?  Healthy volunteers who are fully evaluated to make sure their blood is safe. This is blood bank blood. Transfusion therapy is the safest it has ever been in the practice of medicine. Before blood is taken from a donor, a complete history is taken to make sure that person has no history of diseases nor engages in risky social behavior (examples are intravenous drug use or sexual activity with multiple partners). The donor's travel history is screened to minimize risk of transmitting infections, such as malaria. The donated blood is tested for signs of infectious diseases, such as HIV and hepatitis. The blood is then tested to be sure it is compatible with you in order to minimize the chance of a transfusion reaction. If you or a relative  donates blood, this is often done in anticipation of surgery and is not appropriate for emergency situations. It takes many days to process the donated blood. RISKS AND COMPLICATIONS Although transfusion therapy is very safe and saves many lives, the main dangers of transfusion include:  Getting an infectious disease. Developing a transfusion reaction. This is an allergic reaction to something in the blood you were given. Every precaution is taken to prevent this. The decision to have a blood transfusion has been considered carefully by your caregiver before blood is given. Blood is not given unless the benefits outweigh the risks. AFTER THE TRANSFUSION Right after receiving a blood transfusion, you will usually feel much better and more energetic. This is especially true if your red blood cells have gotten low (anemic). The transfusion raises the level of the red blood cells which carry oxygen, and this usually causes an energy increase. The nurse administering the transfusion will monitor you carefully for complications. HOME CARE INSTRUCTIONS  No special instructions are needed after a transfusion. You may find your energy is better. Speak with  your caregiver about any limitations on activity for underlying diseases you may have. SEEK MEDICAL CARE IF:  Your condition is not improving after your transfusion. You develop redness or irritation at the intravenous (IV) site. SEEK IMMEDIATE MEDICAL CARE IF:  Any of the following symptoms occur over the next 12 hours: Shaking chills. You have a temperature by mouth above 102 F (38.9 C), not controlled by medicine. Chest, back, or muscle pain. People around you feel you are not acting correctly or are confused. Shortness of breath or difficulty breathing. Dizziness and fainting. You get a rash or develop hives. You have a decrease in urine output. Your urine turns a dark color or changes to pink, red, or brown. Any of the following symptoms occur over the next 10 days: You have a temperature by mouth above 102 F (38.9 C), not controlled by medicine. Shortness of breath. Weakness after normal activity. The white part of the eye turns yellow (jaundice). You have a decrease in the amount of urine or are urinating less often. Your urine turns a dark color or changes to pink, red, or brown. Document Released: 11/08/2000 Document Revised: 02/03/2012 Document Reviewed: 06/27/2008 ExitCare Patient Information 2014 ExitCare, [place].  _______________________________________________________________________   PATIENT SIGNATURE_________________________________  NURSE SIGNATURE__________________________________

## 2023-04-02 NOTE — Progress Notes (Addendum)
COVID Vaccine Completed:  Yes  Date of COVID positive in last 80 days:No  PCP - Sandford Craze, NP Cardiologist -Muna Timsina DNP/Steven Rohrbeck, MD  Chest x-ray - 02-17-23 Epic EKG - 02-17-23 Epic Stress Test - 02-26-23 [facility]/12-15-20 [facility] ECHO - 06-05-20 [facility] Cardiac Cath - 10-22-19 Epic Pacemaker/ICD device last checked: Spinal Cord Stimulator:  N/A  Bowel Prep - N/A  Sleep Study - N/A CPAP -   Prediabetes, CBG 102 at PAT Fasting Blood Sugar -  Checks Blood Sugar - does not check   Last dose of GLP1 agonist-  N/A GLP1 instructions:  N/A   Last dose of SGLT-2 inhibitors-  N/A SGLT-2 instructions: N/A   Blood Thinner Instructions:  Time Aspirin Instructions:  ASA 81.  Per patient to stop a week before surgery Last Dose:  Activity level:  Can go up a flight of stairs and perform activities of daily living without stopping and without symptoms of chest pain or shortness of breath..  Some limitations due to hip pain  Anesthesia review:  Hx of MI, CAD with stent placement, ischemic cardiomyopathy, HTN, DM  BP elevated at PAT, patient had not taken BP meds.   Initial BP 186/78 and on recheck 164/77  Family hx of malignant hyperthermia   Patient denies shortness of breath, fever, cough and chest pain at PAT appointment  Patient verbalized understanding of instructions that were given to them at the PAT appointment. Patient was also instructed that they will need to review over the PAT instructions again at home before surgery.

## 2023-04-04 ENCOUNTER — Ambulatory Visit: Payer: Self-pay | Admitting: Student

## 2023-04-04 DIAGNOSIS — E119 Type 2 diabetes mellitus without complications: Secondary | ICD-10-CM

## 2023-04-07 ENCOUNTER — Other Ambulatory Visit: Payer: Self-pay

## 2023-04-07 ENCOUNTER — Encounter (HOSPITAL_COMMUNITY)
Admission: RE | Admit: 2023-04-07 | Discharge: 2023-04-07 | Disposition: A | Discharge status: Home / Self Care | Payer: Medicare Other | Source: Ambulatory Visit | Attending: Orthopedic Surgery | Admitting: Orthopedic Surgery

## 2023-04-07 ENCOUNTER — Encounter (HOSPITAL_COMMUNITY): Payer: Self-pay

## 2023-04-07 VITALS — BP 164/77 | HR 65 | Temp 98.3°F | Resp 20 | Ht 65.0 in | Wt 179.4 lb

## 2023-04-07 DIAGNOSIS — Z01818 Encounter for other preprocedural examination: Secondary | ICD-10-CM

## 2023-04-07 DIAGNOSIS — Z01812 Encounter for preprocedural laboratory examination: Secondary | ICD-10-CM | POA: Insufficient documentation

## 2023-04-07 DIAGNOSIS — E119 Type 2 diabetes mellitus without complications: Secondary | ICD-10-CM | POA: Diagnosis not present

## 2023-04-07 DIAGNOSIS — I10 Essential (primary) hypertension: Secondary | ICD-10-CM

## 2023-04-07 HISTORY — DX: Atherosclerotic heart disease of native coronary artery without angina pectoris: I25.10

## 2023-04-07 HISTORY — DX: Anemia, unspecified: D64.9

## 2023-04-07 LAB — CBC
HCT: 38.4 % (ref 36.0–46.0)
Hemoglobin: 12 g/dL (ref 12.0–15.0)
MCH: 27 pg (ref 26.0–34.0)
MCHC: 31.3 g/dL (ref 30.0–36.0)
MCV: 86.3 fL (ref 80.0–100.0)
Platelets: 332 10*3/uL (ref 150–400)
RBC: 4.45 MIL/uL (ref 3.87–5.11)
RDW: 14.8 % (ref 11.5–15.5)
WBC: 8.5 10*3/uL (ref 4.0–10.5)
nRBC: 0 % (ref 0.0–0.2)

## 2023-04-07 LAB — BASIC METABOLIC PANEL
Anion gap: 6 (ref 5–15)
BUN: 11 mg/dL (ref 8–23)
CO2: 25 mmol/L (ref 22–32)
Calcium: 8.7 mg/dL — ABNORMAL LOW (ref 8.9–10.3)
Chloride: 107 mmol/L (ref 98–111)
Creatinine, Ser: 0.83 mg/dL (ref 0.44–1.00)
GFR, Estimated: >60 mL/min (ref >60)
Glucose, Bld: 104 mg/dL — ABNORMAL HIGH (ref 70–99)
Potassium: 3.9 mmol/L (ref 3.5–5.1)
Sodium: 138 mmol/L (ref 135–145)

## 2023-04-07 LAB — TYPE AND SCREEN: ABO/RH(D): O POS

## 2023-04-07 LAB — GLUCOSE, CAPILLARY: Glucose-Capillary: 102 mg/dL — ABNORMAL HIGH (ref 70–99)

## 2023-04-07 LAB — SURGICAL PCR SCREEN
MRSA, PCR: NEGATIVE
Staphylococcus aureus: NEGATIVE

## 2023-04-07 LAB — HEMOGLOBIN A1C
Hgb A1c MFr Bld: 5.9 % — ABNORMAL HIGH (ref 4.8–5.6)
Mean Plasma Glucose: 122.63 mg/dL

## 2023-04-09 NOTE — Progress Notes (Signed)
Case: [id number] Date/Time: 04/17/23 0915   Procedure: TOTAL HIP ARTHROPLASTY ANTERIOR APPROACH (Right: Hip)   Anesthesia type: Spinal   Pre-op diagnosis: Right hip osteoarthrtis   Location: [facility] ROOM 08 / [facility]   Surgeons: Samson Frederic, MD       DISCUSSION: Jade [name] is a 67 yo female who presents to PAT clinic prior to surgery listed above. PMH significant for former smoking (quit March 2024), hx of NSTEMI (2020), CAD s/p PCI, HTN, GERD, anemia. Anesthesia complications include hx of malignant hyperthermia.   Patient last saw Cardiology on 01/31/23 for a routine visit. Noted to be overall stable at that time. Her hydralazine was increased due to high blood pressure. Cardiac clearance was provided on 02/27/23 (paper copy in chart) that patient is low risk for surgery and to continue meds as prescribed.  Patient last saw PCP on 02/17/23. Was noted to be stable. Preop risk assessment was filled out and patient noted to be medically optimized and moderate risk for surgery (paper copy in chart). HgbA1c was 5.9 on 04/07/23.  VS: BP (!) 164/77   Pulse 65   Temp 36.8 C (Oral)   Resp 20   Ht 5\' 5"  (1.651 m)   Wt 81.4 kg   LMP 11/25/1994   SpO2 100%   BMI 29.85 kg/m   PROVIDERS: PCP: Sandford Craze, NP Cardiologist -Muna Timsina DNP/Steven Rohrbeck, MD ([facility])  LABS: Labs reviewed: Acceptable for surgery. (all labs ordered are listed, but only abnormal results are displayed)  Labs Reviewed  HEMOGLOBIN A1C - Abnormal; Notable for the following components:      Result Value   Hgb A1c MFr Bld 5.9 (*)    All other components within normal limits  BASIC METABOLIC PANEL - Abnormal; Notable for the following components:   Glucose, Bld 104 (*)    Calcium 8.7 (*)    All other components within normal limits  GLUCOSE, CAPILLARY - Abnormal; Notable for the following components:   Glucose-Capillary 102 (*)    All other components within normal limits  SURGICAL PCR SCREEN  CBC   TYPE AND SCREEN     IMAGES:  CXR 02/17/23:  FINDINGS: The heart size and mediastinal contours are within normal limits. Aortic atherosclerosis. Both lungs are clear. The visualized skeletal structures are unremarkable.   IMPRESSION: No active cardiopulmonary disease.     EKG 02/17/23:  Sinus rhythm Left atrial enlargement Left anterior fascicular block LVH Probable anterior infarct, age indeterminate Appears similar to prior    CV:  Echo from 06/05/2020 (care everywhere)  SUMMARY Left ventricular systolic function is normal. LV ejection fraction = 55-60%. There is mild concentric left ventricular hypertrophy. The left atrium is mildly dilated. There is trace aortic regurgitation. There is trace mitral regurgitation. There is mild tricuspid regurgitation. There is no comparison study available.  Perfusion stress test from 12/14/2020 (care everywhere) IMPRESSION: Fixed anterior-Apical wall defect without associated wall motion abnormality compatible with tissue attenuation vs prior myocardial infarction.  No ischemia is seen Normal left ventricular function with ejection fraction calculated at 55 %   Past Medical History:  Diagnosis Date   Abnormal EKG 07/16/2011   ACS (acute coronary syndrome) (HCC) 10/22/2019   Anemia    Benign microscopic hematuria    neg cystoscopy 11/12- dr. Brunilda Payor   Coronary artery disease    DJD (degenerative joint disease)    L KNEE   Family history of anesthesia complication    multiple family members with history of this  GERD (gastroesophageal reflux disease)    Hurthle cell neoplasm of thyroid    Hyperlipidemia LDL goal <70 10/23/2019   Hypertension    Hypertensive retinopathy    Malignant hyperthermia    NSTEMI (non-ST elevated myocardial infarction) (HCC) 10/22/2019   Numbness and tingling in left arm     Past Surgical History:  Procedure Laterality Date   ABDOMINAL HYSTERECTOMY  1996   BREAST EXCISIONAL BIOPSY Left  01/2016   BREAST EXCISIONAL BIOPSY Left 03/2016   CARDIAC CATHETERIZATION     CERVICAL DISC SURGERY  2013   Dr. Lovell Sheehan   COLONOSCOPY  2010   in [place], [place]-normal exam   CORONARY BALLOON ANGIOPLASTY N/A 10/25/2019   Procedure: CORONARY BALLOON ANGIOPLASTY;  Surgeon: Lennette Bihari, MD;  Location: [facility];  Service: Cardiovascular;  Laterality: N/A;   CORONARY STENT INTERVENTION N/A 10/22/2019   Procedure: CORONARY STENT INTERVENTION;  Surgeon: Lennette Bihari, MD;  Location: [facility]AS[facility] CV LAB;  Service: Cardiovascular;  Laterality: N/A;  mid lad    CORONARY STENT INTERVENTION N/A 10/25/2019   Procedure: CORONARY STENT INTERVENTION;  Surgeon: Lennette Bihari, MD;  Location: [facility] CV LAB;  Service: Cardiovascular;  Laterality: N/A;   DILATION AND CURETTAGE OF UTERUS     KNEE CARTILAGE SURGERY  1999   right knee   LEFT HEART CATH AND CORONARY ANGIOGRAPHY N/A 10/22/2019   Procedure: LEFT HEART CATH AND CORONARY ANGIOGRAPHY;  Surgeon: Lennette Bihari, MD;  Location: [facility];  Service: Cardiovascular;  Laterality: N/A;   ROTATOR CUFF REPAIR Right 05/25/2013   TOTAL KNEE ARTHROPLASTY Left 01/20/2014   Procedure: LEFT TOTAL KNEE ARTHROPLASTY;  Surgeon: Javier Docker, MD;  Location: [facility];  Service: Orthopedics;  Laterality: Left;   TOTAL KNEE ARTHROPLASTY Right 01/19/2015   Procedure: RIGHT TOTAL KNEE ARTHROPLASTY;  Surgeon: Javier Docker, MD;  Location: [facility];  Service: Orthopedics;  Laterality: Right;   TUBAL LIGATION      MEDICATIONS:  amoxicillin-clavulanate (AUGMENTIN) 875-125 MG tablet   aspirin EC 81 MG tablet   azelastine (ASTELIN) 0.1 % nasal spray   carvedilol (COREG) 12.5 MG tablet   fluticasone (FLONASE) 50 MCG/ACT nasal spray   gabapentin (NEURONTIN) 300 MG capsule   hydrALAZINE (APRESOLINE) 50 MG tablet   isosorbide mononitrate (IMDUR) 30 MG 24 hr tablet   linaclotide (LINZESS) 145 MCG CAPS capsule   losartan (COZAAR) 100 MG tablet    methocarbamol (ROBAXIN) 500 MG tablet   nitroGLYCERIN (NITROSTAT) 0.4 MG SL tablet   ondansetron (ZOFRAN) 4 MG tablet   oxyCODONE-acetaminophen (PERCOCET) 7.5-325 MG tablet   pantoprazole (PROTONIX) 40 MG tablet   potassium chloride SA (KLOR-CON M20) 20 MEQ tablet   rosuvastatin (CRESTOR) 20 MG tablet   VENTOLIN HFA 108 (90 Base) MCG/ACT inhaler   No current facility-administered medications for this encounter.   Marcille Blanco [facility] Surgical Short Stay/Anesthesiology [facility] Phone [phone] 04/09/2023 1:37 PM

## 2023-04-11 NOTE — Anesthesia Preprocedure Evaluation (Addendum)
Anesthesia Evaluation  Patient identified by MRN, date of birth, ID band Patient awake    Reviewed: Allergy & Precautions, H&P , NPO status , Patient's Chart, lab work & pertinent test results  History of Anesthesia Complications (+) MALIGNANT HYPERTHERMIA, Family history of anesthesia reaction and history of anesthetic complications  Airway Mallampati: II  TM Distance: >3 FB Neck ROM: Full    Dental no notable dental hx. (+) Partial Lower, Partial Upper, Dental Advisory Given   Pulmonary neg pulmonary ROS, former smoker   Pulmonary exam normal breath sounds clear to auscultation       Cardiovascular Exercise Tolerance: Good hypertension, Pt. on medications and Pt. on home beta blockers + CAD, + Past MI and + Cardiac Stents   Rhythm:Regular Rate:Normal     Neuro/Psych negative neurological ROS  negative psych ROS   GI/Hepatic Neg liver ROS,GERD  Medicated,,  Endo/Other  negative endocrine ROS    Renal/GU negative Renal ROS  negative genitourinary   Musculoskeletal  (+) Arthritis , Osteoarthritis,    Abdominal   Peds  Hematology  (+) Blood dyscrasia, anemia   Anesthesia Other Findings   Reproductive/Obstetrics negative OB ROS                             Anesthesia Physical Anesthesia Plan  ASA: 3  Anesthesia Plan: Spinal   Post-op Pain Management: Tylenol PO (pre-op)*   Induction: Intravenous  PONV Risk Score and Plan: 3 and Ondansetron, Dexamethasone and Propofol infusion  Airway Management Planned: Natural Airway and Simple Face Mask  Additional Equipment:   Intra-op Plan:   Post-operative Plan:   Informed Consent: I have reviewed the patients History and Physical, chart, labs and discussed the procedure including the risks, benefits and alternatives for the proposed anesthesia with the patient or authorized representative who has indicated his/her understanding and  acceptance.     Dental advisory given and Interpreter used for interveiw  Plan Discussed with: CRNA  Anesthesia Plan Comments: (See PAT note from 5/13 by Sherlie Ban PA-C)        Anesthesia Quick Evaluation

## 2023-04-16 ENCOUNTER — Ambulatory Visit: Payer: Self-pay | Admitting: Student

## 2023-04-16 NOTE — H&P (Signed)
sTOTAL HIP ADMISSION H&P  Patient is admitted for right total hip arthroplasty.  Subjective:  Chief Complaint: right hip pain  HPI: Jade [name], 67 y.o. female, has a history of pain and functional disability in the right hip(s) due to arthritis and patient has failed non-surgical conservative treatments for greater than 12 weeks to include NSAID's and/or analgesics, corticosteriod injections, flexibility and strengthening excercises, use of assistive devices, and activity modification.  Onset of symptoms was gradual starting 10 years ago with rapidlly worsening course since that time.The patient noted no past surgery on the right hip(s).  Patient currently rates pain in the right hip at 10 out of 10 with activity. Patient has night pain, worsening of pain with activity and weight bearing, trendelenberg gait, pain that interfers with activities of daily living, and pain with passive range of motion. Patient has evidence of subchondral cysts, subchondral sclerosis, periarticular osteophytes, and joint space narrowing by imaging studies. This condition presents safety issues increasing the risk of falls. There is no current active infection.  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   IBS (irritable bowel syndrome) 08/29/2014   DJD (degenerative joint disease) of knee 01/20/2014   Back pain 12/27/2013   Dyslipidemia 12/27/2013   Preoperative evaluation to rule out surgical contraindication 08/17/2013   Diabetes type 2, controlled  (HCC) 03/26/2013   Knee pain, bilateral 03/26/2013   Cervical disc disease 03/26/2013   Allergic rhinitis 03/04/2012   Hypertension 10/11/2011   General medical examination 07/16/2011   Tobacco abuse 07/16/2011   Joint pain 07/16/2011   Abnormal EKG 07/16/2011   Microscopic hematuria 07/16/2011   GERD (gastroesophageal reflux disease) 06/24/2011   Past Medical History:  Diagnosis Date   Abnormal EKG 07/16/2011   ACS (acute coronary syndrome) (HCC) 10/22/2019   Anemia    Benign microscopic hematuria    neg cystoscopy 11/12- dr. Brunilda Payor   Coronary artery disease    DJD (degenerative joint disease)    L KNEE   Family history of anesthesia complication    multiple family members with history of this   GERD (gastroesophageal reflux disease)    Hurthle cell neoplasm of thyroid    Hyperlipidemia LDL goal <70 10/23/2019   Hypertension    Hypertensive retinopathy    Malignant hyperthermia    NSTEMI (non-ST elevated myocardial infarction) (HCC) 10/22/2019   Numbness and tingling in left arm     Past Surgical History:  Procedure Laterality Date   ABDOMINAL HYSTERECTOMY  1996   BREAST EXCISIONAL BIOPSY Left 01/2016   BREAST EXCISIONAL BIOPSY Left 03/2016   CARDIAC CATHETERIZATION     CERVICAL DISC SURGERY  2013   Dr. Lovell Sheehan   COLONOSCOPY  2010   in [place], [place]-normal exam   CORONARY BALLOON ANGIOPLASTY N/A 10/25/2019   Procedure: CORONARY BALLOON ANGIOPLASTY;  Surgeon: Lennette Bihari, MD;  Location: [facility];  Service: Cardiovascular;  Laterality: N/A;   CORONARY STENT INTERVENTION N/A 10/22/2019   Procedure: CORONARY STENT INTERVENTION;  Surgeon: Lennette Bihari, MD;  Location: [facility];  Service: Cardiovascular;  Laterality: N/A;  mid lad    CORONARY STENT INTERVENTION N/A 10/25/2019   Procedure: CORONARY STENT INTERVENTION;  Surgeon: Lennette Bihari, MD;  Location: [facility];  Service: Cardiovascular;  Laterality: N/A;   DILATION AND CURETTAGE  OF UTERUS     KNEE CARTILAGE SURGERY  1999   right knee   LEFT HEART CATH AND CORONARY ANGIOGRAPHY N/A 10/22/2019   Procedure: LEFT HEART CATH AND CORONARY ANGIOGRAPHY;  Surgeon: Lennette Bihari, MD;  Location: [facility];  Service: Cardiovascular;  Laterality: N/A;   ROTATOR CUFF REPAIR Right 05/25/2013   TOTAL KNEE ARTHROPLASTY Left 01/20/2014   Procedure: LEFT TOTAL KNEE ARTHROPLASTY;  Surgeon: Javier Docker, MD;  Location: [facility];  Service: Orthopedics;  Laterality: Left;   TOTAL KNEE ARTHROPLASTY Right 01/19/2015   Procedure: RIGHT TOTAL KNEE ARTHROPLASTY;  Surgeon: Javier Docker, MD;  Location: [facility];  Service: Orthopedics;  Laterality: Right;   TUBAL LIGATION      Current Outpatient Medications  Medication Sig Dispense Refill Last Dose   amoxicillin-clavulanate (AUGMENTIN) 875-125 MG tablet Take 1 tablet by mouth 2 (two) times daily. (Patient not taking: Reported on 04/04/2023) 20 tablet 0    aspirin EC 81 MG tablet Take 81 mg by mouth at bedtime.      azelastine (ASTELIN) 0.1 % nasal spray Place 2 sprays into both nostrils 2 (two) times daily. Use in each nostril as directed (Patient not taking: Reported on 04/04/2023) 30 mL 12    carvedilol (COREG) 12.5 MG tablet Take 1 tablet (12.5 mg total) by mouth 2 (two) times daily with a meal. 180 tablet 1    fluticasone (FLONASE) 50 MCG/ACT nasal spray Place 2 sprays into both nostrils daily. (Patient not taking: Reported on 04/04/2023) 16 g 6    gabapentin (NEURONTIN) 300 MG capsule TAKE 1 CAPSULE BY MOUTH DAILY IN THE MORNING, 1 AT NOON AND 2 AT BEDTIME. 360 capsule 1    hydrALAZINE (APRESOLINE) 50 MG tablet Take 50 mg by mouth 2 (two) times daily.      isosorbide mononitrate (IMDUR) 30 MG 24 hr tablet Take 1 tablet (30 mg total) by mouth daily. 90 tablet 1    linaclotide (LINZESS) 145 MCG CAPS capsule Take 1 capsule (145 mcg total) by mouth daily before breakfast. (Patient taking differently: Take 145 mcg by mouth daily as needed  (constipation).) 30 capsule 5    losartan (COZAAR) 100 MG tablet TAKE 1 TABLET BY MOUTH EVERY DAY 90 tablet 1    methocarbamol (ROBAXIN) 500 MG tablet Take 1 tablet (500 mg total) by mouth 2 (two) times daily as needed.  2    nitroGLYCERIN (NITROSTAT) 0.4 MG SL tablet Place 1 tablet (0.4 mg total) under the tongue every 5 (five) minutes as needed for chest pain (CP or SOB). 25 tablet 3    ondansetron (ZOFRAN) 4 MG tablet Take 1 tablet (4 mg total) by mouth every 6 (six) hours as needed for nausea or vomiting. (Patient not taking: Reported on 04/04/2023) 20 tablet 0    oxyCODONE-acetaminophen (PERCOCET) 7.5-325 MG tablet Take 1 tablet by mouth at bedtime as needed. (Patient taking differently: Take 1 tablet by mouth in the morning, at noon, and at bedtime.) 30 tablet 0    pantoprazole (PROTONIX) 40 MG tablet Take 1 tablet (40 mg total) by mouth daily. 180 tablet 3    potassium chloride SA (KLOR-CON M20) 20 MEQ tablet TAKE 1 TABLET  BY MOUTH TWICE A DAY 180 tablet 0    rosuvastatin (CRESTOR) 20 MG tablet TAKE 1 TABLET BY MOUTH EVERY DAY 90 tablet 1    VENTOLIN HFA 108 (90 Base) MCG/ACT inhaler INHALE 2 PUFFS INTO THE LUNGS EVERY 6 HOURS AS NEEDED FOR WHEEZING OR SHORTNESS OF BREATH. (Patient not taking: Reported on 04/04/2023) 18 g 0    No current facility-administered medications for this visit.   Allergies  Allergen Reactions   Shrimp [Shellfish Allergy] Itching    Rash and lips itching   Ace Inhibitors Cough    Cough   Aspirin Nausea And Vomiting   Azithromycin Rash    Social History   Tobacco Use   Smoking status: Former    Packs/day: 0.50    Years: 45.00    Additional pack years: 0.00    Total pack years: 22.50    Types: Cigarettes    Quit date: 01/24/2023    Years since quitting: 0.2   Smokeless tobacco: Never  Substance Use Topics   Alcohol use: No    Alcohol/week: 0.0 standard drinks of alcohol    Family History  Problem Relation Age of Onset   Hypertension Mother     Hyperlipidemia Mother    Heart disease Father        CAD; stent at age 37   Diabetes Father    Hypertension Father    Hyperlipidemia Father    Cancer Father        prostate   Dementia Father    Osteoarthritis Sister    Hypertension Sister    COPD Sister    CAD Sister        s/p stent   Edema Sister        cause unknown   Osteoporosis Sister        bad hips, bed bound, has one leg amputated   Drug abuse Sister    Dementia Paternal Grandmother    Cancer Paternal Aunt        BREAST   Breast cancer Paternal Aunt    Colon cancer Neg Hx      Review of Systems  Musculoskeletal:  Positive for arthralgias, back pain and gait problem.  All other systems reviewed and are negative.   Objective:  Physical Exam Constitutional:      Appearance: Normal appearance.  HENT:     Head: Normocephalic and atraumatic.     Nose: Nose normal.     Mouth/Throat:     Mouth: Mucous membranes are moist.     Pharynx: Oropharynx is clear.  Eyes:     Conjunctiva/sclera: Conjunctivae normal.  Cardiovascular:     Rate and Rhythm: Normal rate and regular rhythm.     Pulses: Normal pulses.     Heart sounds: Normal heart sounds.  Pulmonary:     Effort: Pulmonary effort is normal.     Breath sounds: Normal breath sounds.  Abdominal:     General: Abdomen is flat.     Palpations: Abdomen is soft.  Genitourinary:    Comments: deferred Musculoskeletal:     Cervical back: Normal range of motion and neck supple.     Comments: Examination of the right hip no skin wounds or lesions. Mild trochanteric tenderness to palpation. She has severely restricted range of motion of the right hip. Pain with terminal flexion and rotation. Pain in the position of impingement.  Neurovascular intact distally.  Skin:    General: Skin is warm and dry.     Capillary  Refill: Capillary refill takes less than 2 seconds.  Neurological:     General: No focal deficit present.     Mental Status: She is alert and oriented to  person, place, and time.  Psychiatric:        Mood and Affect: Mood normal.        Behavior: Behavior normal.        Thought Content: Thought content normal.        Judgment: Judgment normal.     Vital signs in last 24 hours: @VSRANGES @  Labs:   Estimated body mass index is 29.85 kg/m as calculated from the following:   Height as of 04/07/23: 5\' 5"  (1.651 m).   Weight as of 04/07/23: 81.4 kg.   Imaging Review Plain radiographs demonstrate severe degenerative joint disease of the right hip(s). The bone quality appears to be adequate for age and reported activity level.      Assessment/Plan:  End stage arthritis, right hip(s)  The patient history, physical examination, clinical judgement of the provider and imaging studies are consistent with end stage degenerative joint disease of the right hip(s) and total hip arthroplasty is deemed medically necessary. The treatment options including medical management, injection therapy, arthroscopy and arthroplasty were discussed at length. The risks and benefits of total hip arthroplasty were presented and reviewed. The risks due to aseptic loosening, infection, stiffness, dislocation/subluxation,  thromboembolic complications and other imponderables were discussed.  The patient acknowledged the explanation, agreed to proceed with the plan and consent was signed. Patient is being admitted for inpatient treatment for surgery, pain control, PT, OT, prophylactic antibiotics, VTE prophylaxis, progressive ambulation and ADL's and discharge planning.The patient is planning to be discharged home with HEP after an overnight stay.   Therapy Plans: HEP.  Disposition: Home with daughter and husband Planned DVT Prophylaxis: aspirin 81mg  BID DME needed: Has rolling walker and cane.  PCP: Cleared. Cardiology: Cleared.  TXA: IV Allergies:  - ACE inhibitors - cough - Azithromycin - rash - Shrimp - itching.  Anesthesia Concerns: Family history malignant  hyperthermia. None personally.  BMI: 29.5 Last HgbA1c: 6.6 Other: - History CAD, s/p PCI.  - Aspirin 81mg  baseline.  - Chronic low back pain, pain management Ramos. Oxycodone 7.5mg  TID - Oxycodone, zofran, methocarbamol, meloxicam.  - 04/07/23: Hgb 12.0, Cr. 0.83, K+ 3.9.     Patient's anticipated LOS is less than 2 midnights, meeting these requirements: - Younger than 73 - Lives within 1 hour of care - Has a competent adult at home to recover with post-op recover - NO history of  - Chronic pain requiring opiods  - Diabetes  - Coronary Artery Disease  - Heart failure  - Heart attack  - Stroke  - DVT/VTE  - Cardiac arrhythmia  - Respiratory Failure/COPD  - Renal failure  - Anemia  - Advanced Liver disease

## 2023-04-16 NOTE — H&P (View-Only) (Signed)
sTOTAL HIP ADMISSION H&P  Patient is admitted for right total hip arthroplasty.  Subjective:  Chief Complaint: right hip pain  HPI: Jade [name], 66 y.o. female, has a history of pain and functional disability in the right hip(s) due to arthritis and patient has failed non-surgical conservative treatments for greater than 12 weeks to include NSAID's and/or analgesics, corticosteriod injections, flexibility and strengthening excercises, use of assistive devices, and activity modification.  Onset of symptoms was gradual starting 10 years ago with rapidlly worsening course since that time.The patient noted no past surgery on the right hip(s).  Patient currently rates pain in the right hip at 10 out of 10 with activity. Patient has night pain, worsening of pain with activity and weight bearing, trendelenberg gait, pain that interfers with activities of daily living, and pain with passive range of motion. Patient has evidence of subchondral cysts, subchondral sclerosis, periarticular osteophytes, and joint space narrowing by imaging studies. This condition presents safety issues increasing the risk of falls. There is no current active infection.  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   IBS (irritable bowel syndrome) 08/29/2014   DJD (degenerative joint disease) of knee 01/20/2014   Back pain 12/27/2013   Dyslipidemia 12/27/2013   Preoperative evaluation to rule out surgical contraindication 08/17/2013   Diabetes type 2, controlled  (HCC) 03/26/2013   Knee pain, bilateral 03/26/2013   Cervical disc disease 03/26/2013   Allergic rhinitis 03/04/2012   Hypertension 10/11/2011   General medical examination 07/16/2011   Tobacco abuse 07/16/2011   Joint pain 07/16/2011   Abnormal EKG 07/16/2011   Microscopic hematuria 07/16/2011   GERD (gastroesophageal reflux disease) 06/24/2011   Past Medical History:  Diagnosis Date   Abnormal EKG 07/16/2011   ACS (acute coronary syndrome) (HCC) 10/22/2019   Anemia    Benign microscopic hematuria    neg cystoscopy 11/12- dr. nesi   Coronary artery disease    DJD (degenerative joint disease)    L KNEE   Family history of anesthesia complication    multiple family members with history of this   GERD (gastroesophageal reflux disease)    Hurthle cell neoplasm of thyroid    Hyperlipidemia LDL goal <70 10/23/2019   Hypertension    Hypertensive retinopathy    Malignant hyperthermia    NSTEMI (non-ST elevated myocardial infarction) (HCC) 10/22/2019   Numbness and tingling in left arm     Past Surgical History:  Procedure Laterality Date   ABDOMINAL HYSTERECTOMY  1996   BREAST EXCISIONAL BIOPSY Left 01/2016   BREAST EXCISIONAL BIOPSY Left 03/2016   CARDIAC CATHETERIZATION     CERVICAL DISC SURGERY  2013   Dr. Jenkins   COLONOSCOPY  2010   in [place], [place]-normal exam   CORONARY BALLOON ANGIOPLASTY N/A 10/25/2019   Procedure: CORONARY BALLOON ANGIOPLASTY;  Surgeon: Kelly, Thomas A, MD;  Location: [facility];  Service: Cardiovascular;  Laterality: N/A;   CORONARY STENT INTERVENTION N/A 10/22/2019   Procedure: CORONARY STENT INTERVENTION;  Surgeon: Kelly, Thomas A, MD;  Location: [facility];  Service: Cardiovascular;  Laterality: N/A;  mid lad    CORONARY STENT INTERVENTION N/A 10/25/2019   Procedure: CORONARY STENT INTERVENTION;  Surgeon: Kelly, Thomas A, MD;  Location: [facility];  Service: Cardiovascular;  Laterality: N/A;   DILATION AND CURETTAGE  OF UTERUS     KNEE CARTILAGE SURGERY  1999   right knee   LEFT HEART CATH AND CORONARY ANGIOGRAPHY N/A 10/22/2019   Procedure: LEFT HEART CATH AND CORONARY ANGIOGRAPHY;  Surgeon: Kelly, Thomas A, MD;  Location: [facility];  Service: Cardiovascular;  Laterality: N/A;   ROTATOR CUFF REPAIR Right 05/25/2013   TOTAL KNEE ARTHROPLASTY Left 01/20/2014   Procedure: LEFT TOTAL KNEE ARTHROPLASTY;  Surgeon: Jeffrey C Beane, MD;  Location: [facility];  Service: Orthopedics;  Laterality: Left;   TOTAL KNEE ARTHROPLASTY Right 01/19/2015   Procedure: RIGHT TOTAL KNEE ARTHROPLASTY;  Surgeon: Jeffrey C Beane, MD;  Location: [facility];  Service: Orthopedics;  Laterality: Right;   TUBAL LIGATION      Current Outpatient Medications  Medication Sig Dispense Refill Last Dose   amoxicillin-clavulanate (AUGMENTIN) 875-125 MG tablet Take 1 tablet by mouth 2 (two) times daily. (Patient not taking: Reported on 04/04/2023) 20 tablet 0    aspirin EC 81 MG tablet Take 81 mg by mouth at bedtime.      azelastine (ASTELIN) 0.1 % nasal spray Place 2 sprays into both nostrils 2 (two) times daily. Use in each nostril as directed (Patient not taking: Reported on 04/04/2023) 30 mL 12    carvedilol (COREG) 12.5 MG tablet Take 1 tablet (12.5 mg total) by mouth 2 (two) times daily with a meal. 180 tablet 1    fluticasone (FLONASE) 50 MCG/ACT nasal spray Place 2 sprays into both nostrils daily. (Patient not taking: Reported on 04/04/2023) 16 g 6    gabapentin (NEURONTIN) 300 MG capsule TAKE 1 CAPSULE BY MOUTH DAILY IN THE MORNING, 1 AT NOON AND 2 AT BEDTIME. 360 capsule 1    hydrALAZINE (APRESOLINE) 50 MG tablet Take 50 mg by mouth 2 (two) times daily.      isosorbide mononitrate (IMDUR) 30 MG 24 hr tablet Take 1 tablet (30 mg total) by mouth daily. 90 tablet 1    linaclotide (LINZESS) 145 MCG CAPS capsule Take 1 capsule (145 mcg total) by mouth daily before breakfast. (Patient taking differently: Take 145 mcg by mouth daily as needed  (constipation).) 30 capsule 5    losartan (COZAAR) 100 MG tablet TAKE 1 TABLET BY MOUTH EVERY DAY 90 tablet 1    methocarbamol (ROBAXIN) 500 MG tablet Take 1 tablet (500 mg total) by mouth 2 (two) times daily as needed.  2    nitroGLYCERIN (NITROSTAT) 0.4 MG SL tablet Place 1 tablet (0.4 mg total) under the tongue every 5 (five) minutes as needed for chest pain (CP or SOB). 25 tablet 3    ondansetron (ZOFRAN) 4 MG tablet Take 1 tablet (4 mg total) by mouth every 6 (six) hours as needed for nausea or vomiting. (Patient not taking: Reported on 04/04/2023) 20 tablet 0    oxyCODONE-acetaminophen (PERCOCET) 7.5-325 MG tablet Take 1 tablet by mouth at bedtime as needed. (Patient taking differently: Take 1 tablet by mouth in the morning, at noon, and at bedtime.) 30 tablet 0    pantoprazole (PROTONIX) 40 MG tablet Take 1 tablet (40 mg total) by mouth daily. 180 tablet 3    potassium chloride SA (KLOR-CON M20) 20 MEQ tablet TAKE 1 TABLET   BY MOUTH TWICE A DAY 180 tablet 0    rosuvastatin (CRESTOR) 20 MG tablet TAKE 1 TABLET BY MOUTH EVERY DAY 90 tablet 1    VENTOLIN HFA 108 (90 Base) MCG/ACT inhaler INHALE 2 PUFFS INTO THE LUNGS EVERY 6 HOURS AS NEEDED FOR WHEEZING OR SHORTNESS OF BREATH. (Patient not taking: Reported on 04/04/2023) 18 g 0    No current facility-administered medications for this visit.   Allergies  Allergen Reactions   Shrimp [Shellfish Allergy] Itching    Rash and lips itching   Ace Inhibitors Cough    Cough   Aspirin Nausea And Vomiting   Azithromycin Rash    Social History   Tobacco Use   Smoking status: Former    Packs/day: 0.50    Years: 45.00    Additional pack years: 0.00    Total pack years: 22.50    Types: Cigarettes    Quit date: 01/24/2023    Years since quitting: 0.2   Smokeless tobacco: Never  Substance Use Topics   Alcohol use: No    Alcohol/week: 0.0 standard drinks of alcohol    Family History  Problem Relation Age of Onset   Hypertension Mother     Hyperlipidemia Mother    Heart disease Father        CAD; stent at age 68   Diabetes Father    Hypertension Father    Hyperlipidemia Father    Cancer Father        prostate   Dementia Father    Osteoarthritis Sister    Hypertension Sister    COPD Sister    CAD Sister        s/p stent   Edema Sister        cause unknown   Osteoporosis Sister        bad hips, bed bound, has one leg amputated   Drug abuse Sister    Dementia Paternal Grandmother    Cancer Paternal Aunt        BREAST   Breast cancer Paternal Aunt    Colon cancer Neg Hx      Review of Systems  Musculoskeletal:  Positive for arthralgias, back pain and gait problem.  All other systems reviewed and are negative.   Objective:  Physical Exam Constitutional:      Appearance: Normal appearance.  HENT:     Head: Normocephalic and atraumatic.     Nose: Nose normal.     Mouth/Throat:     Mouth: Mucous membranes are moist.     Pharynx: Oropharynx is clear.  Eyes:     Conjunctiva/sclera: Conjunctivae normal.  Cardiovascular:     Rate and Rhythm: Normal rate and regular rhythm.     Pulses: Normal pulses.     Heart sounds: Normal heart sounds.  Pulmonary:     Effort: Pulmonary effort is normal.     Breath sounds: Normal breath sounds.  Abdominal:     General: Abdomen is flat.     Palpations: Abdomen is soft.  Genitourinary:    Comments: deferred Musculoskeletal:     Cervical back: Normal range of motion and neck supple.     Comments: Examination of the right hip no skin wounds or lesions. Mild trochanteric tenderness to palpation. She has severely restricted range of motion of the right hip. Pain with terminal flexion and rotation. Pain in the position of impingement.  Neurovascular intact distally.  Skin:    General: Skin is warm and dry.     Capillary   Refill: Capillary refill takes less than 2 seconds.  Neurological:     General: No focal deficit present.     Mental Status: She is alert and oriented to  person, place, and time.  Psychiatric:        Mood and Affect: Mood normal.        Behavior: Behavior normal.        Thought Content: Thought content normal.        Judgment: Judgment normal.     Vital signs in last 24 hours: @VSRANGES@  Labs:   Estimated body mass index is 29.85 kg/m as calculated from the following:   Height as of 04/07/23: 5' 5" (1.651 m).   Weight as of 04/07/23: 81.4 kg.   Imaging Review Plain radiographs demonstrate severe degenerative joint disease of the right hip(s). The bone quality appears to be adequate for age and reported activity level.      Assessment/Plan:  End stage arthritis, right hip(s)  The patient history, physical examination, clinical judgement of the provider and imaging studies are consistent with end stage degenerative joint disease of the right hip(s) and total hip arthroplasty is deemed medically necessary. The treatment options including medical management, injection therapy, arthroscopy and arthroplasty were discussed at length. The risks and benefits of total hip arthroplasty were presented and reviewed. The risks due to aseptic loosening, infection, stiffness, dislocation/subluxation,  thromboembolic complications and other imponderables were discussed.  The patient acknowledged the explanation, agreed to proceed with the plan and consent was signed. Patient is being admitted for inpatient treatment for surgery, pain control, PT, OT, prophylactic antibiotics, VTE prophylaxis, progressive ambulation and ADL's and discharge planning.The patient is planning to be discharged home with HEP after an overnight stay.   Therapy Plans: HEP.  Disposition: Home with daughter and husband Planned DVT Prophylaxis: aspirin 81mg BID DME needed: Has rolling walker and cane.  PCP: Cleared. Cardiology: Cleared.  TXA: IV Allergies:  - ACE inhibitors - cough - Azithromycin - rash - Shrimp - itching.  Anesthesia Concerns: Family history malignant  hyperthermia. None personally.  BMI: 29.5 Last HgbA1c: 6.6 Other: - History CAD, s/p PCI.  - Aspirin 81mg baseline.  - Chronic low back pain, pain management Ramos. Oxycodone 7.5mg TID - Oxycodone, zofran, methocarbamol, meloxicam.  - 04/07/23: Hgb 12.0, Cr. 0.83, K+ 3.9.     Patient's anticipated LOS is less than 2 midnights, meeting these requirements: - Younger than 65 - Lives within 1 hour of care - Has a competent adult at home to recover with post-op recover - NO history of  - Chronic pain requiring opiods  - Diabetes  - Coronary Artery Disease  - Heart failure  - Heart attack  - Stroke  - DVT/VTE  - Cardiac arrhythmia  - Respiratory Failure/COPD  - Renal failure  - Anemia  - Advanced Liver disease   

## 2023-04-17 ENCOUNTER — Ambulatory Visit (HOSPITAL_COMMUNITY): Payer: Medicare Other

## 2023-04-17 ENCOUNTER — Observation Stay (HOSPITAL_COMMUNITY)
Admission: RE | Admit: 2023-04-17 | Discharge: 2023-04-18 | Disposition: A | Discharge status: Home / Self Care | Payer: Medicare Other | Source: Ambulatory Visit | Attending: Orthopedic Surgery | Admitting: Orthopedic Surgery

## 2023-04-17 ENCOUNTER — Ambulatory Visit (HOSPITAL_BASED_OUTPATIENT_CLINIC_OR_DEPARTMENT_OTHER): Payer: Medicare Other | Admitting: Anesthesiology

## 2023-04-17 ENCOUNTER — Other Ambulatory Visit: Payer: Self-pay

## 2023-04-17 ENCOUNTER — Ambulatory Visit (HOSPITAL_COMMUNITY): Payer: Medicare Other | Admitting: Physician Assistant

## 2023-04-17 ENCOUNTER — Encounter (HOSPITAL_COMMUNITY)
Admission: RE | Disposition: A | Discharge status: Home / Self Care | Payer: Self-pay | Source: Ambulatory Visit | Attending: Orthopedic Surgery

## 2023-04-17 ENCOUNTER — Observation Stay (HOSPITAL_COMMUNITY): Payer: Medicare Other

## 2023-04-17 ENCOUNTER — Encounter (HOSPITAL_COMMUNITY): Payer: Self-pay | Admitting: Orthopedic Surgery

## 2023-04-17 DIAGNOSIS — Z96641 Presence of right artificial hip joint: Secondary | ICD-10-CM | POA: Diagnosis not present

## 2023-04-17 DIAGNOSIS — I2510 Atherosclerotic heart disease of native coronary artery without angina pectoris: Secondary | ICD-10-CM | POA: Diagnosis not present

## 2023-04-17 DIAGNOSIS — Z96653 Presence of artificial knee joint, bilateral: Secondary | ICD-10-CM | POA: Diagnosis not present

## 2023-04-17 DIAGNOSIS — Z79899 Other long term (current) drug therapy: Secondary | ICD-10-CM | POA: Diagnosis not present

## 2023-04-17 DIAGNOSIS — E119 Type 2 diabetes mellitus without complications: Secondary | ICD-10-CM | POA: Insufficient documentation

## 2023-04-17 DIAGNOSIS — Z87891 Personal history of nicotine dependence: Secondary | ICD-10-CM | POA: Diagnosis not present

## 2023-04-17 DIAGNOSIS — I10 Essential (primary) hypertension: Secondary | ICD-10-CM | POA: Insufficient documentation

## 2023-04-17 DIAGNOSIS — Z7982 Long term (current) use of aspirin: Secondary | ICD-10-CM | POA: Insufficient documentation

## 2023-04-17 DIAGNOSIS — Z955 Presence of coronary angioplasty implant and graft: Secondary | ICD-10-CM | POA: Diagnosis not present

## 2023-04-17 DIAGNOSIS — I252 Old myocardial infarction: Secondary | ICD-10-CM | POA: Diagnosis not present

## 2023-04-17 DIAGNOSIS — M1611 Unilateral primary osteoarthritis, right hip: Secondary | ICD-10-CM

## 2023-04-17 DIAGNOSIS — D649 Anemia, unspecified: Secondary | ICD-10-CM | POA: Diagnosis not present

## 2023-04-17 DIAGNOSIS — Z471 Aftercare following joint replacement surgery: Secondary | ICD-10-CM | POA: Diagnosis not present

## 2023-04-17 HISTORY — PX: TOTAL HIP ARTHROPLASTY: SHX124

## 2023-04-17 LAB — GLUCOSE, CAPILLARY
Glucose-Capillary: 76 mg/dL (ref 70–99)
Glucose-Capillary: 107 mg/dL — ABNORMAL HIGH (ref 70–99)

## 2023-04-17 LAB — TYPE AND SCREEN: Antibody Screen: NEGATIVE

## 2023-04-17 SURGERY — ARTHROPLASTY, HIP, TOTAL, ANTERIOR APPROACH
Anesthesia: Spinal | Site: Hip | Laterality: Right

## 2023-04-17 MED ORDER — LIDOCAINE 2% (20 MG/ML) 5 ML SYRINGE
INTRAMUSCULAR | Status: DC | PRN
  Administered 2023-04-17: 40 mg via INTRAVENOUS

## 2023-04-17 MED ORDER — FENTANYL CITRATE PF 50 MCG/ML IJ SOSY: 25.0000 ug | PREFILLED_SYRINGE | INTRAMUSCULAR | Status: DC | PRN

## 2023-04-17 MED ORDER — METOCLOPRAMIDE HCL 5 MG/ML IJ SOLN: 5.0000 mg | Freq: Three times a day (TID) | INTRAMUSCULAR | Status: DC | PRN

## 2023-04-17 MED ORDER — LIDOCAINE HCL (PF) 2 % IJ SOLN
INTRAMUSCULAR | Status: AC
  Filled 2023-04-17: qty 5

## 2023-04-17 MED ORDER — DEXAMETHASONE SODIUM PHOSPHATE 10 MG/ML IJ SOLN
INTRAMUSCULAR | Status: DC | PRN
  Administered 2023-04-17: 8 mg via INTRAVENOUS

## 2023-04-17 MED ORDER — PROPOFOL 10 MG/ML IV BOLUS
INTRAVENOUS | Status: DC | PRN
  Administered 2023-04-17: 20 mg via INTRAVENOUS
  Administered 2023-04-17: 10 mg via INTRAVENOUS
  Administered 2023-04-17: 20 mg via INTRAVENOUS

## 2023-04-17 MED ORDER — ACETAMINOPHEN 325 MG PO TABS: 325.0000 mg | ORAL_TABLET | Freq: Four times a day (QID) | ORAL | Status: DC | PRN

## 2023-04-17 MED ORDER — ONDANSETRON HCL 4 MG/2ML IJ SOLN
INTRAMUSCULAR | Status: DC | PRN
  Administered 2023-04-17: 4 mg via INTRAVENOUS

## 2023-04-17 MED ORDER — EPINEPHRINE PF 1 MG/ML IJ SOLN
INTRAMUSCULAR | Status: DC | PRN
  Administered 2023-04-17: .15 mL

## 2023-04-17 MED ORDER — POVIDONE-IODINE 10 % EX SWAB: 2.0000 | Freq: Once | CUTANEOUS | Status: DC

## 2023-04-17 MED ORDER — ACETAMINOPHEN 500 MG PO TABS
1000.0000 mg | ORAL_TABLET | Freq: Once | ORAL | Status: DC
  Filled 2023-04-17: qty 2

## 2023-04-17 MED ORDER — LINACLOTIDE 145 MCG PO CAPS: 145.0000 ug | ORAL_CAPSULE | Freq: Every day | ORAL | Status: DC | PRN

## 2023-04-17 MED ORDER — BUPIVACAINE IN DEXTROSE 0.75-8.25 % IT SOLN
INTRATHECAL | Status: DC | PRN
  Administered 2023-04-17: 1.8 mL via INTRATHECAL

## 2023-04-17 MED ORDER — DOCUSATE SODIUM 100 MG PO CAPS
100.0000 mg | ORAL_CAPSULE | Freq: Two times a day (BID) | ORAL | Status: DC
  Administered 2023-04-17 – 2023-04-18 (×2): 100 mg via ORAL
  Filled 2023-04-17 (×2): qty 1

## 2023-04-17 MED ORDER — ONDANSETRON HCL 4 MG/2ML IJ SOLN: 4.0000 mg | Freq: Four times a day (QID) | INTRAMUSCULAR | Status: DC | PRN

## 2023-04-17 MED ORDER — ROSUVASTATIN CALCIUM 20 MG PO TABS
20.0000 mg | ORAL_TABLET | Freq: Every day | ORAL | Status: DC
  Administered 2023-04-17 – 2023-04-18 (×2): 20 mg via ORAL
  Filled 2023-04-17 (×2): qty 1

## 2023-04-17 MED ORDER — SODIUM CHLORIDE 0.9 % IR SOLN
Status: DC | PRN
  Administered 2023-04-17 (×2): 1000 mL

## 2023-04-17 MED ORDER — CEFAZOLIN SODIUM-DEXTROSE 2-4 GM/100ML-% IV SOLN
2.0000 g | Freq: Four times a day (QID) | INTRAVENOUS | Status: AC
  Administered 2023-04-17 (×2): 2 g via INTRAVENOUS
  Filled 2023-04-17 (×2): qty 100

## 2023-04-17 MED ORDER — ACETAMINOPHEN 500 MG PO TABS
1000.0000 mg | ORAL_TABLET | Freq: Once | ORAL | Status: AC
  Administered 2023-04-17: 1000 mg via ORAL

## 2023-04-17 MED ORDER — LACTATED RINGERS IV SOLN: INTRAVENOUS | Status: DC

## 2023-04-17 MED ORDER — PROPOFOL 1000 MG/100ML IV EMUL
INTRAVENOUS | Status: AC
  Filled 2023-04-17: qty 100

## 2023-04-17 MED ORDER — KETOROLAC TROMETHAMINE 30 MG/ML IJ SOLN
INTRAMUSCULAR | Status: AC
  Filled 2023-04-17: qty 1

## 2023-04-17 MED ORDER — GABAPENTIN 100 MG PO CAPS
100.0000 mg | ORAL_CAPSULE | Freq: Two times a day (BID) | ORAL | Status: DC
  Administered 2023-04-17 – 2023-04-18 (×2): 100 mg via ORAL
  Filled 2023-04-17 (×2): qty 1

## 2023-04-17 MED ORDER — CEFAZOLIN SODIUM-DEXTROSE 2-4 GM/100ML-% IV SOLN
2.0000 g | INTRAVENOUS | Status: AC
  Administered 2023-04-17: 2 g via INTRAVENOUS
  Filled 2023-04-17: qty 100

## 2023-04-17 MED ORDER — ORAL CARE MOUTH RINSE: 15.0000 mL | Freq: Once | OROMUCOSAL | Status: AC

## 2023-04-17 MED ORDER — PHENOL 1.4 % MT LIQD: 1.0000 | OROMUCOSAL | Status: DC | PRN

## 2023-04-17 MED ORDER — BUPIVACAINE HCL 0.25 % IJ SOLN
INTRAMUSCULAR | Status: AC
  Filled 2023-04-17: qty 1

## 2023-04-17 MED ORDER — POTASSIUM CHLORIDE CRYS ER 20 MEQ PO TBCR
20.0000 meq | EXTENDED_RELEASE_TABLET | Freq: Two times a day (BID) | ORAL | Status: DC
  Administered 2023-04-17 – 2023-04-18 (×2): 20 meq via ORAL
  Filled 2023-04-17 (×2): qty 1

## 2023-04-17 MED ORDER — MIDAZOLAM HCL 5 MG/5ML IJ SOLN
INTRAMUSCULAR | Status: DC | PRN
  Administered 2023-04-17: 2 mg via INTRAVENOUS

## 2023-04-17 MED ORDER — PHENYLEPHRINE HCL-NACL 20-0.9 MG/250ML-% IV SOLN
INTRAVENOUS | Status: DC | PRN
  Administered 2023-04-17: 30 ug/min via INTRAVENOUS

## 2023-04-17 MED ORDER — METHOCARBAMOL 1000 MG/10ML IJ SOLN: 500.0000 mg | Freq: Four times a day (QID) | INTRAVENOUS | Status: DC | PRN

## 2023-04-17 MED ORDER — CARVEDILOL 12.5 MG PO TABS
12.5000 mg | ORAL_TABLET | Freq: Two times a day (BID) | ORAL | Status: DC
  Administered 2023-04-17 – 2023-04-18 (×2): 12.5 mg via ORAL
  Filled 2023-04-17 (×2): qty 1

## 2023-04-17 MED ORDER — NITROGLYCERIN 0.4 MG SL SUBL: 0.4000 mg | SUBLINGUAL_TABLET | SUBLINGUAL | Status: DC | PRN

## 2023-04-17 MED ORDER — HYDROMORPHONE HCL 1 MG/ML IJ SOLN: 0.5000 mg | INTRAMUSCULAR | Status: DC | PRN

## 2023-04-17 MED ORDER — GABAPENTIN 100 MG PO CAPS
200.0000 mg | ORAL_CAPSULE | Freq: Every day | ORAL | Status: DC
  Administered 2023-04-17: 200 mg via ORAL
  Filled 2023-04-17: qty 2

## 2023-04-17 MED ORDER — DIPHENHYDRAMINE HCL 12.5 MG/5ML PO ELIX: 12.5000 mg | ORAL_SOLUTION | ORAL | Status: DC | PRN

## 2023-04-17 MED ORDER — METHOCARBAMOL 500 MG PO TABS
500.0000 mg | ORAL_TABLET | Freq: Four times a day (QID) | ORAL | Status: DC | PRN
  Administered 2023-04-17 – 2023-04-18 (×4): 500 mg via ORAL
  Filled 2023-04-17 (×4): qty 1

## 2023-04-17 MED ORDER — ASPIRIN 81 MG PO CHEW
81.0000 mg | CHEWABLE_TABLET | Freq: Two times a day (BID) | ORAL | Status: DC
  Administered 2023-04-17 – 2023-04-18 (×2): 81 mg via ORAL
  Filled 2023-04-17 (×2): qty 1

## 2023-04-17 MED ORDER — METOCLOPRAMIDE HCL 5 MG PO TABS: 5.0000 mg | ORAL_TABLET | Freq: Three times a day (TID) | ORAL | Status: DC | PRN

## 2023-04-17 MED ORDER — GABAPENTIN 100 MG PO CAPS: 100.0000 mg | ORAL_CAPSULE | Freq: Every day | ORAL | Status: DC

## 2023-04-17 MED ORDER — TRANEXAMIC ACID-NACL 1000-0.7 MG/100ML-% IV SOLN
1000.0000 mg | INTRAVENOUS | Status: AC
  Administered 2023-04-17: 1000 mg via INTRAVENOUS
  Filled 2023-04-17: qty 100

## 2023-04-17 MED ORDER — ACETAMINOPHEN 500 MG PO TABS
1000.0000 mg | ORAL_TABLET | Freq: Four times a day (QID) | ORAL | Status: DC
  Administered 2023-04-17 – 2023-04-18 (×3): 1000 mg via ORAL
  Filled 2023-04-17 (×4): qty 2

## 2023-04-17 MED ORDER — MIDAZOLAM HCL 2 MG/2ML IJ SOLN
INTRAMUSCULAR | Status: AC
  Filled 2023-04-17: qty 2

## 2023-04-17 MED ORDER — HYDRALAZINE HCL 50 MG PO TABS
50.0000 mg | ORAL_TABLET | Freq: Two times a day (BID) | ORAL | Status: DC
  Administered 2023-04-17 – 2023-04-18 (×2): 50 mg via ORAL
  Filled 2023-04-17 (×2): qty 1

## 2023-04-17 MED ORDER — OXYCODONE HCL 5 MG PO TABS
10.0000 mg | ORAL_TABLET | ORAL | Status: DC | PRN
  Administered 2023-04-17: 15 mg via ORAL
  Administered 2023-04-17: 10 mg via ORAL
  Administered 2023-04-18 (×3): 15 mg via ORAL
  Filled 2023-04-17 (×4): qty 3

## 2023-04-17 MED ORDER — EPINEPHRINE PF 1 MG/ML IJ SOLN
INTRAMUSCULAR | Status: AC
  Filled 2023-04-17: qty 1

## 2023-04-17 MED ORDER — PANTOPRAZOLE SODIUM 40 MG PO TBEC
40.0000 mg | DELAYED_RELEASE_TABLET | Freq: Every day | ORAL | Status: DC
  Administered 2023-04-18: 40 mg via ORAL
  Filled 2023-04-17: qty 1

## 2023-04-17 MED ORDER — WATER FOR IRRIGATION, STERILE IR SOLN
Status: DC | PRN
  Administered 2023-04-17 (×2): 1000 mL

## 2023-04-17 MED ORDER — SODIUM CHLORIDE (PF) 0.9 % IJ SOLN
INTRAMUSCULAR | Status: DC | PRN
  Administered 2023-04-17: 10 mL

## 2023-04-17 MED ORDER — OXYCODONE HCL 5 MG PO TABS
5.0000 mg | ORAL_TABLET | ORAL | Status: DC | PRN
  Filled 2023-04-17: qty 2

## 2023-04-17 MED ORDER — POLYETHYLENE GLYCOL 3350 17 G PO PACK: 17.0000 g | PACK | Freq: Every day | ORAL | Status: DC | PRN

## 2023-04-17 MED ORDER — MENTHOL 3 MG MT LOZG: 1.0000 | LOZENGE | OROMUCOSAL | Status: DC | PRN

## 2023-04-17 MED ORDER — PROPOFOL 500 MG/50ML IV EMUL
INTRAVENOUS | Status: DC | PRN
  Administered 2023-04-17: 100 ug/kg/min via INTRAVENOUS

## 2023-04-17 MED ORDER — KETOROLAC TROMETHAMINE 30 MG/ML IJ SOLN
INTRAMUSCULAR | Status: DC | PRN
  Administered 2023-04-17: 30 mg

## 2023-04-17 MED ORDER — GABAPENTIN 100 MG PO CAPS: 100.0000 mg | ORAL_CAPSULE | Freq: Three times a day (TID) | ORAL | Status: DC

## 2023-04-17 MED ORDER — SODIUM CHLORIDE (PF) 0.9 % IJ SOLN
INTRAMUSCULAR | Status: AC
  Filled 2023-04-17: qty 30

## 2023-04-17 MED ORDER — BUPIVACAINE HCL (PF) 0.25 % IJ SOLN
INTRAMUSCULAR | Status: DC | PRN
  Administered 2023-04-17: 30 mL

## 2023-04-17 MED ORDER — ONDANSETRON HCL 4 MG PO TABS: 4.0000 mg | ORAL_TABLET | Freq: Four times a day (QID) | ORAL | Status: DC | PRN

## 2023-04-17 MED ORDER — ISOSORBIDE MONONITRATE ER 30 MG PO TB24
30.0000 mg | ORAL_TABLET | Freq: Every day | ORAL | Status: DC
  Administered 2023-04-18: 30 mg via ORAL
  Filled 2023-04-17: qty 1

## 2023-04-17 MED ORDER — ALUM & MAG HYDROXIDE-SIMETH 200-200-20 MG/5ML PO SUSP: 30.0000 mL | ORAL | Status: DC | PRN

## 2023-04-17 MED ORDER — CHLORHEXIDINE GLUCONATE 0.12 % MT SOLN
15.0000 mL | Freq: Once | OROMUCOSAL | Status: AC
  Administered 2023-04-17: 15 mL via OROMUCOSAL

## 2023-04-17 MED ORDER — SENNA 8.6 MG PO TABS
1.0000 | ORAL_TABLET | Freq: Two times a day (BID) | ORAL | Status: DC
  Administered 2023-04-17 – 2023-04-18 (×2): 8.6 mg via ORAL
  Filled 2023-04-17 (×2): qty 1

## 2023-04-17 MED ORDER — SODIUM CHLORIDE 0.9 % IV SOLN: INTRAVENOUS | Status: DC

## 2023-04-17 SURGICAL SUPPLY — 56 items
ACE SHELL 3H 52 E HIP (Shell) ×1 IMPLANT
ADH SKN CLS APL DERMABOND .7 (GAUZE/BANDAGES/DRESSINGS) ×1
APL PRP STRL LF DISP 70% ISPRP (MISCELLANEOUS) ×1
BAG COUNTER SPONGE SURGICOUNT (BAG) IMPLANT
BAG DECANTER FOR FLEXI CONT (MISCELLANEOUS) IMPLANT
BAG SPEC THK2 15X12 ZIP CLS (MISCELLANEOUS)
BAG SPNG CNTER NS LX DISP (BAG)
BAG ZIPLOCK 12X15 (MISCELLANEOUS) IMPLANT
CHLORAPREP W/TINT 26 (MISCELLANEOUS) ×1 IMPLANT
COVER PERINEAL POST (MISCELLANEOUS) ×1 IMPLANT
COVER SURGICAL LIGHT HANDLE (MISCELLANEOUS) ×1 IMPLANT
DERMABOND ADVANCED .7 DNX12 (GAUZE/BANDAGES/DRESSINGS) ×2 IMPLANT
DRAPE IMP U-DRAPE 54X76 (DRAPES) ×1 IMPLANT
DRAPE SHEET LG 3/4 BI-LAMINATE (DRAPES) ×3 IMPLANT
DRAPE STERI IOBAN 125X83 (DRAPES) ×1 IMPLANT
DRAPE U-SHAPE 47X51 STRL (DRAPES) ×1 IMPLANT
DRSG AQUACEL AG ADV 3.5X10 (GAUZE/BANDAGES/DRESSINGS) ×1 IMPLANT
ELECT REM PT RETURN 15FT ADLT (MISCELLANEOUS) ×1 IMPLANT
GAUZE SPONGE 4X4 12PLY STRL (GAUZE/BANDAGES/DRESSINGS) ×1 IMPLANT
GLOVE BIO SURGEON STRL SZ7 (GLOVE) ×1 IMPLANT
GLOVE BIO SURGEON STRL SZ8.5 (GLOVE) ×2 IMPLANT
GLOVE BIOGEL PI IND STRL 7.5 (GLOVE) ×1 IMPLANT
GLOVE BIOGEL PI IND STRL 8.5 (GLOVE) ×1 IMPLANT
GOWN SPEC L3 XXLG W/TWL (GOWN DISPOSABLE) ×1 IMPLANT
GOWN STRL REUS W/ TWL XL LVL3 (GOWN DISPOSABLE) ×1 IMPLANT
GOWN STRL REUS W/TWL XL LVL3 (GOWN DISPOSABLE) ×1
HANDPIECE INTERPULSE COAX TIP (DISPOSABLE) ×1
HEAD CERAMIC BIOLOX 36 (Head) IMPLANT
HOLDER FOLEY CATH W/STRAP (MISCELLANEOUS) ×1 IMPLANT
HOOD PEEL AWAY T7 (MISCELLANEOUS) ×3 IMPLANT
KIT TURNOVER KIT A (KITS) IMPLANT
LINER ACE G7 HIGH 36 SZ E (Liner) IMPLANT
MANIFOLD NEPTUNE II (INSTRUMENTS) ×1 IMPLANT
MARKER SKIN DUAL TIP RULER LAB (MISCELLANEOUS) ×1 IMPLANT
NDL SAFETY ECLIP 18X1.5 (MISCELLANEOUS) ×1 IMPLANT
NDL SPNL 18GX3.5 QUINCKE PK (NEEDLE) ×1 IMPLANT
NEEDLE SPNL 18GX3.5 QUINCKE PK (NEEDLE) ×1 IMPLANT
PACK ANTERIOR HIP CUSTOM (KITS) ×1 IMPLANT
SAW OSC TIP CART 19.5X105X1.3 (SAW) ×1 IMPLANT
SEALER BIPOLAR AQUA 6.0 (INSTRUMENTS) ×1 IMPLANT
SET HNDPC FAN SPRY TIP SCT (DISPOSABLE) ×1 IMPLANT
SHELL ACETAB 3H 52 E HIP (Shell) IMPLANT
SOLUTION PRONTOSAN WOUND 350ML (IRRIGATION / IRRIGATOR) ×1 IMPLANT
SPIKE FLUID TRANSFER (MISCELLANEOUS) ×1 IMPLANT
STEM FEMORAL TAPERLOC 13X111 (Stem) IMPLANT
SUT MNCRL AB 3-0 PS2 18 (SUTURE) ×1 IMPLANT
SUT MON AB 2-0 CT1 36 (SUTURE) ×1 IMPLANT
SUT STRATAFIX PDO 1 14 VIOLET (SUTURE) ×1
SUT STRATFX PDO 1 14 VIOLET (SUTURE) ×1
SUT VIC AB 2-0 CT1 27 (SUTURE)
SUT VIC AB 2-0 CT1 TAPERPNT 27 (SUTURE) IMPLANT
SUTURE STRATFX PDO 1 14 VIOLET (SUTURE) ×1 IMPLANT
SYR 3ML LL SCALE MARK (SYRINGE) ×1 IMPLANT
TRAY FOLEY MTR SLVR 16FR STAT (SET/KITS/TRAYS/PACK) IMPLANT
TUBE SUCTION HIGH CAP CLEAR NV (SUCTIONS) ×1 IMPLANT
WATER STERILE IRR 1000ML POUR (IV SOLUTION) ×1 IMPLANT

## 2023-04-17 NOTE — Care Plan (Signed)
Ortho Bundle Case Management Note  Patient Details  Name: Jade [name] MRN: [id number] Date of Birth: 10-09-1956                  R THA on 04/17/23. DCP: Home with husband and daughter. DME: No needs. Has RW and cane. PT: HEP   DME Arranged:  N/A DME Agency:       Additional Comments: Please contact me with any questions of if this plan should need to change.    Despina Pole, Case Manager  [facility]  [phone] 04/17/2023, 4:07 PM

## 2023-04-17 NOTE — Anesthesia Procedure Notes (Signed)
Spinal  Patient location during procedure: OR Start time: 04/17/2023 9:42 AM End time: 04/17/2023 9:47 AM Reason for block: surgical anesthesia Staffing Performed: anesthesiologist  Anesthesiologist: Gaynelle Adu, MD Performed by: Gaynelle Adu, MD Authorized by: Gaynelle Adu, MD   Preanesthetic Checklist Completed: patient identified, IV checked, risks and benefits discussed, surgical consent, monitors and equipment checked, pre-op evaluation and timeout performed Spinal Block Patient position: sitting Prep: DuraPrep Patient monitoring: cardiac monitor, continuous pulse ox and blood pressure Approach: midline Location: L3-4 Injection technique: single-shot Needle Needle type: Pencan  Needle gauge: 24 G Needle length: 9 cm Assessment Sensory level: T8 Events: CSF return and second provider Additional Notes Functioning IV was confirmed and monitors were applied. Sterile prep and drape, including hand hygiene and sterile gloves were used. The patient was positioned and the spine was prepped. The skin was anesthetized with lidocaine.  Free flow of clear CSF was obtained prior to injecting local anesthetic into the CSF.  The spinal needle did not aspirate freely following injection.  The needle was carefully withdrawn.  The patient tolerated the procedure well.

## 2023-04-17 NOTE — Anesthesia Procedure Notes (Signed)
Procedure Name: MAC Date/Time: 04/17/2023 10:00 AM  Performed by: Vanessa Bethany, CRNAPre-anesthesia Checklist: Patient identified, Emergency Drugs available, Suction available and Patient being monitored Patient Re-evaluated:Patient Re-evaluated prior to induction Oxygen Delivery Method: Simple face mask

## 2023-04-17 NOTE — Discharge Instructions (Signed)
? ?Dr. Brian Swinteck ?Joint Replacement Specialist ?[facility] ?[address] ?[place], [place] [address] ?[phone] ? ? ?TOTAL HIP REPLACEMENT POSTOPERATIVE DIRECTIONS ? ? ? ?Hip Rehabilitation, Guidelines Following Surgery  ? ?WEIGHT BEARING ?Weight bearing as tolerated with assist device (walker, cane, etc) as directed, use it as long as suggested by your surgeon or therapist, typically at least 4-6 weeks. ? ?The results of a hip operation are greatly improved after range of motion and muscle strengthening exercises. Follow all safety measures which are given to protect your hip. If any of these exercises cause increased pain or swelling in your joint, decrease the amount until you are comfortable again. Then slowly increase the exercises. Call your caregiver if you have problems or questions.  ? ?HOME CARE INSTRUCTIONS  ?Most of the following instructions are designed to prevent the dislocation of your new hip.  ?Remove items at home which could result in a fall. This includes throw rugs or furniture in walking pathways.  ?Continue medications as instructed at time of discharge. ?You may have some home medications which will be placed on hold until you complete the course of blood thinner medication. ?You may start showering once you are discharged home. Do not remove your dressing. ?Do not put on socks or shoes without following the instructions of your caregivers.   ?Sit on chairs with arms. Use the chair arms to help push yourself up when arising.  ?Arrange for the use of a toilet seat elevator so you are not sitting low.  ?Walk with walker as instructed.  ?You may resume a sexual relationship in one month or when given the OK by your caregiver.  ?Use walker as long as suggested by your caregivers.  ?You may put full weight on your legs and walk as much as is comfortable. ?Avoid periods of inactivity such as sitting longer than an hour when not asleep. This helps prevent blood  clots.  ?You may return to work once you are cleared by your surgeon.  ?Do not drive a car for 6 weeks or until released by your surgeon.  ?Do not drive while taking narcotics.  ?Wear elastic stockings for two weeks following surgery during the day but you may remove then at night.  ?Make sure you keep all of your appointments after your operation with all of your doctors and caregivers. You should call the office at the above phone number and make an appointment for approximately two weeks after the date of your surgery. ?Please pick up a stool softener and laxative for home use as long as you are requiring pain medications. ?ICE to the affected hip every three hours for 30 minutes at a time and then as needed for pain and swelling. Continue to use ice on the hip for pain and swelling from surgery. You may notice swelling that will progress down to the foot and ankle.  This is normal after surgery.  Elevate the leg when you are not up walking on it.   ?It is important for you to complete the blood thinner medication as prescribed by your doctor. ?Continue to use the breathing machine which will help keep your temperature down.  It is common for your temperature to cycle up and down following surgery, especially at night when you are not up moving around and exerting yourself.  The breathing machine keeps your lungs expanded and your temperature down. ? ?RANGE OF MOTION AND STRENGTHENING EXERCISES  ?These exercises are designed to help you   keep full movement of your hip joint. Follow your caregiver's or physical therapist's instructions. Perform all exercises about fifteen times, three times per day or as directed. Exercise both hips, even if you have had only one joint replacement. These exercises can be done on a training (exercise) mat, on the floor, on a table or on a bed. Use whatever works the best and is most comfortable for you. Use music or television while you are exercising so that the exercises are a  pleasant break in your day. This will make your life better with the exercises acting as a break in routine you can look forward to.  ?Lying on your back, slowly slide your foot toward your buttocks, raising your knee up off the floor. Then slowly slide your foot back down until your leg is straight again.  ?Lying on your back spread your legs as far apart as you can without causing discomfort.  ?Lying on your side, raise your upper leg and foot straight up from the floor as far as is comfortable. Slowly lower the leg and repeat.  ?Lying on your back, tighten up the muscle in the front of your thigh (quadriceps muscles). You can do this by keeping your leg straight and trying to raise your heel off the floor. This helps strengthen the largest muscle supporting your knee.  ?Lying on your back, tighten up the muscles of your buttocks both with the legs straight and with the knee bent at a comfortable angle while keeping your heel on the floor.  ? ?SKILLED REHAB INSTRUCTIONS: ?If the patient is transferred to a skilled rehab facility following release from the hospital, a list of the current medications will be sent to the facility for the patient to continue.  When discharged from the skilled rehab facility, please have the facility set up the patient's Home Health Physical Therapy prior to being released. Also, the skilled facility will be responsible for providing the patient with their medications at time of release from the facility to include their pain medication and their blood thinner medication. If the patient is still at the rehab facility at time of the two week follow up appointment, the skilled rehab facility will also need to assist the patient in arranging follow up appointment in our office and any transportation needs. ? ?POST-OPERATIVE OPIOID TAPER INSTRUCTIONS: ?It is important to wean off of your opioid medication as soon as possible. If you do not need pain medication after your surgery it is ok  to stop day one. ?Opioids include: ?Codeine, Hydrocodone(Norco, Vicodin), Oxycodone(Percocet, oxycontin) and hydromorphone amongst others.  ?Long term and even short term use of opiods can cause: ?Increased pain response ?Dependence ?Constipation ?Depression ?Respiratory depression ?And more.  ?Withdrawal symptoms can include ?Flu like symptoms ?Nausea, vomiting ?And more ?Techniques to manage these symptoms ?Hydrate well ?Eat regular healthy meals ?Stay active ?Use relaxation techniques(deep breathing, meditating, yoga) ?Do Not substitute Alcohol to help with tapering ?If you have been on opioids for less than two weeks and do not have pain than it is ok to stop all together.  ?Plan to wean off of opioids ?This plan should start within one week post op of your joint replacement. ?Maintain the same interval or time between taking each dose and first decrease the dose.  ?Cut the total daily intake of opioids by one tablet each day ?Next start to increase the time between doses. ?The last dose that should be eliminated is the evening dose.  ? ? ?MAKE   SURE YOU:  ?Understand these instructions.  ?Will watch your condition.  ?Will get help right away if you are not doing well or get worse. ? ?Pick up stool softner and laxative for home use following surgery while on pain medications. ?Do not remove your dressing. ?The dressing is waterproof--it is OK to take showers. ?Continue to use ice for pain and swelling after surgery. ?Do not use any lotions or creams on the incision until instructed by your surgeon. ?Total Hip Protocol. ? ?

## 2023-04-17 NOTE — Transfer of Care (Signed)
Immediate Anesthesia Transfer of Care Note  Patient: Jade [name]  Procedure(s) Performed: TOTAL HIP ARTHROPLASTY ANTERIOR APPROACH (Right: Hip)  Patient Location: PACU  Anesthesia Type:Spinal  Level of Consciousness: awake and patient cooperative  Airway & Oxygen Therapy: Patient Spontanous Breathing and Patient connected to face mask  Post-op Assessment: Report given to RN and Post -op Vital signs reviewed and stable  Post vital signs: Reviewed and stable  Last Vitals:  Vitals Value Taken Time  BP 113/71 04/17/23 1137  Temp    Pulse 63 04/17/23 1140  Resp 19 04/17/23 1140  SpO2 98 % 04/17/23 1140  Vitals shown include unvalidated device data.  Last Pain:  Vitals:   04/17/23 0738  TempSrc:   PainSc: 9          Complications: No notable events documented.

## 2023-04-17 NOTE — Op Note (Signed)
OPERATIVE REPORT  SURGEON: Samson Frederic, MD   ASSISTANT: Clint Bolder, PA-C.  PREOPERATIVE DIAGNOSIS: Right hip arthritis.   POSTOPERATIVE DIAGNOSIS: Right hip arthritis.   PROCEDURE: Right total hip arthroplasty, anterior approach.   IMPLANTS: Biomet Taperloc Complete Microplasty stem, size 13 x 111 mm, high offset. Biomet G7 OsseoTi Cup, size 52 mm. Biomet Vivacit-E liner, size 36 mm, E, neutral. Biomet Biolox ceramic head ball, size 36 - 3 mm.  ANESTHESIA:  MAC and Spinal  ESTIMATED BLOOD LOSS:-100 mL    ANTIBIOTICS: 2 g Ancef.  DRAINS: None.  COMPLICATIONS: None.   CONDITION: PACU - hemodynamically stable.   BRIEF CLINICAL NOTE: Jade [name] is a 67 y.o. female with a long-standing history of Right hip arthritis. After failing conservative management, the patient was indicated for total hip arthroplasty. The risks, benefits, and alternatives to the procedure were explained, and the patient elected to proceed.  PROCEDURE IN DETAIL: Surgical site was marked by myself in the pre-op holding area. Once inside the operating room, spinal anesthesia was obtained, and a foley catheter was inserted. The patient was then positioned on the Hana table.  All bony prominences were well padded.  The hip was prepped and draped in the normal sterile surgical fashion.  A time-out was called verifying side and site of surgery. The patient received IV antibiotics within 60 minutes of beginning the procedure.   Bikini incision was made, and superficial dissection was performed lateral to the ASIS. The direct anterior approach to the hip was performed through the Hueter interval.  Lateral femoral circumflex vessels were treated with the Auqumantys. The anterior capsule was exposed and an inverted T capsulotomy was made. The femoral neck cut was made to the level of the templated cut.  A corkscrew was placed into the head and the head was removed.  The femoral head was found to have  eburnated bone. The head was passed to the back table and was measured. Pubofemoral ligament was released off of the calcar, taking care to stay on bone. Superior capsule was released from the greater trochanter, taking care to stay lateral to the posterior border of the femoral neck in order to preserve the short external rotators.   Acetabular exposure was achieved, and the pulvinar and labrum were excised. Sequential reaming of the acetabulum was then performed up to a size 51 mm reamer. A 52 mm cup was then opened and impacted into place at approximately 40 degrees of abduction and 20 degrees of anteversion. The final polyethylene liner was impacted into place and acetabular osteophytes were removed.    I then gained femoral exposure taking care to protect the abductors and greater trochanter.  This was performed using standard external rotation, extension, and adduction.  A cookie cutter was used to enter the femoral canal, and then the femoral canal finder was placed.  Sequential broaching was performed up to a size 13.  Calcar planer was used on the femoral neck remnant.  I placed a high offset neck and a trial head ball.  The hip was reduced.  Leg lengths and offset were checked fluoroscopically.  The hip was dislocated and trial components were removed.  The final implants were placed, and the hip was reduced.  Fluoroscopy was used to confirm component position and leg lengths.  At 90 degrees of external rotation and full extension, the hip was stable to an anterior directed force.   The wound was copiously irrigated with Prontosan solution and normal saline using pule lavage.  Marcaine solution was injected into the periarticular soft tissue.  The wound was closed in layers using #1 Vicryl and V-Loc for the fascia, 2-0 Vicryl for the subcutaneous fat, 2-0 Monocryl for the deep dermal layer, 3-0 running Monocryl subcuticular stitch, and Dermabond for the skin.  Once the glue was fully dried, an  Aquacell Ag dressing was applied.  The patient was transported to the recovery room in stable condition.  Sponge, needle, and instrument counts were correct at the end of the case x2.  The patient tolerated the procedure well and there were no known complications.  The aquamantis was utilized for this case to help facilitate better hemostasis as patient was felt to be at increased risk of bleeding because of complex case requiring increased OR time and/or exposure, minimally invasive aproach.  A oscillating saw tip was utilized for this case to prevent damage to the soft tissue structures such as muscles, ligaments and tendons, and to ensure accurate bone cuts. This patient was at increased risk for above structures due to  minimally invasive approach.  Please note that a surgical assistant was a medical necessity for this procedure to perform it in a safe and expeditious manner. Assistant was necessary to provide appropriate retraction of vital neurovascular structures, to prevent femoral fracture, and to allow for anatomic placement of the prosthesis.

## 2023-04-17 NOTE — Interval H&P Note (Signed)
History and Physical Interval Note:  04/17/2023 7:27 AM  Jade [name]  has presented today for surgery, with the diagnosis of Right hip osteoarthrtis.  The various methods of treatment have been discussed with the patient and family. After consideration of risks, benefits and other options for treatment, the patient has consented to  Procedure(s): TOTAL HIP ARTHROPLASTY ANTERIOR APPROACH (Right) as a surgical intervention.  The patient's history has been reviewed, patient examined, no change in status, stable for surgery.  I have reviewed the patient's chart and labs.  Questions were answered to the patient's satisfaction.     Jade [name]

## 2023-04-17 NOTE — Evaluation (Signed)
Physical Therapy Evaluation Patient Details Name: Jade [name] MRN: [id number] DOB: Jun 10, 1956 Today's Date: 04/17/2023  History of Present Illness  Pt s/p R THR and with hx of bil TKR  Clinical Impression  Pt s/p R THR and presents with decreased R LE strength/ROM and post op pain limiting functional mobility.  Pt should progress to dc home with family assist.     Recommendations for follow up therapy are one component of a multi-disciplinary discharge planning process, led by the attending physician.  Recommendations may be updated based on patient status, additional functional criteria and insurance authorization.  Follow Up Recommendations       Assistance Recommended at Discharge Intermittent Supervision/Assistance  Patient can return home with the following       Equipment Recommendations None recommended by PT  Recommendations for Other Services       Functional Status Assessment Patient has had a recent decline in their functional status and demonstrates the ability to make significant improvements in function in a reasonable and predictable amount of time.     Precautions / Restrictions Precautions Precautions: Fall Restrictions Weight Bearing Restrictions: No Other Position/Activity Restrictions: WBAT      Mobility  Bed Mobility Overal bed mobility: Needs Assistance Bed Mobility: Supine to Sit     Supine to sit: Min assist     General bed mobility comments: cues for sequence and use of L LE to self assist    Transfers Overall transfer level: Needs assistance Equipment used: Rolling walker (2 wheels) Transfers: Sit to/from Stand Sit to Stand: Min assist           General transfer comment: cues for LE management and use of UEs to self assist    Ambulation/Gait Ambulation/Gait assistance: Min assist Gait Distance (Feet): 95 Feet Assistive device: Rolling walker (2 wheels) Gait Pattern/deviations: Step-to pattern, Step-through pattern,  Decreased step length - right, Decreased step length - left, Shuffle, Trunk flexed Gait velocity: decr     General Gait Details: cues for sequence, posture and position from [employer]            Wheelchair Mobility    Modified Rankin (Stroke Patients Only)       Balance Overall balance assessment: Mild deficits observed, not formally tested                                           Pertinent Vitals/Pain Pain Assessment Pain Assessment: 0-10 Pain Score: 3  Pain Location: R hip Pain Descriptors / Indicators: Aching, Sore Pain Intervention(s): Limited activity within patient's tolerance, Monitored during session, Premedicated before session, Ice applied    Home Living Family/patient expects to be discharged to:: Private residence Living Arrangements: Spouse/significant other Available Help at Discharge: Family Type of Home: House Home Access: Ramped entrance       Home Layout: One level Home Equipment: Agricultural consultant (2 wheels);Cane - single point      Prior Function Prior Level of Function : Independent/Modified Independent                     Hand Dominance        Extremity/Trunk Assessment   Upper Extremity Assessment Upper Extremity Assessment: Overall WFL for tasks assessed    Lower Extremity Assessment Lower Extremity Assessment: RLE deficits/detail    Cervical / Trunk Assessment Cervical / Trunk Assessment: Normal  Communication  Communication: No difficulties  Cognition Arousal/Alertness: Awake/alert Behavior During Therapy: WFL for tasks assessed/performed Overall Cognitive Status: Within Functional Limits for tasks assessed                                          General Comments      Exercises Total Joint Exercises Ankle Circles/Pumps: AROM, Both, 15 reps, Supine   Assessment/Plan    PT Assessment Patient needs continued PT services  PT Problem List Decreased strength;Decreased  range of motion;Decreased activity tolerance;Decreased balance;Decreased mobility;Decreased knowledge of use of DME;Pain       PT Treatment Interventions DME instruction;Gait training;Stair training;Functional mobility training;Therapeutic activities;Therapeutic exercise;Patient/family education    PT Goals (Current goals can be found in the Care Plan section)  Acute Rehab PT Goals Patient Stated Goal: REgain IND PT Goal Formulation: With patient Time For Goal Achievement: 04/24/23 Potential to Achieve Goals: Good    Frequency 7X/week     Co-evaluation               AM-PAC PT "6 Clicks" Mobility  Outcome Measure Help needed turning from your back to your side while in a flat bed without using bedrails?: A Little Help needed moving from lying on your back to sitting on the side of a flat bed without using bedrails?: A Little Help needed moving to and from a bed to a chair (including a wheelchair)?: A Little Help needed standing up from a chair using your arms (e.g., wheelchair or bedside chair)?: A Little Help needed to walk in hospital room?: A Little Help needed climbing 3-5 steps with a railing? : A Lot 6 Click Score: 17    End of Session Equipment Utilized During Treatment: Gait belt Activity Tolerance: Patient tolerated treatment well Patient left: in chair;with call bell/phone within reach;with chair alarm set;with family/visitor present Nurse Communication: Mobility status PT Visit Diagnosis: Difficulty in walking, not elsewhere classified (R26.2)    Time: 4098-1191 PT Time Calculation (min) (ACUTE ONLY): 19 min   Charges:   PT Evaluation $PT Eval Low Complexity: 1 Low          Jade [name] PT Acute Rehabilitation Services Pager [phone] Office [phone]   Jade [name] 04/17/2023, 4:48 PM

## 2023-04-17 NOTE — Anesthesia Postprocedure Evaluation (Signed)
Anesthesia Post Note  Patient: Jade [name]  Procedure(s) Performed: TOTAL HIP ARTHROPLASTY ANTERIOR APPROACH (Right: Hip)     Patient location during evaluation: PACU Anesthesia Type: Spinal Level of consciousness: oriented and awake and alert Pain management: pain level controlled Vital Signs Assessment: post-procedure vital signs reviewed and stable Respiratory status: spontaneous breathing and respiratory function stable Cardiovascular status: blood pressure returned to baseline and stable Postop Assessment: no headache, no backache, no apparent nausea or vomiting, spinal receding and patient able to bend at knees Anesthetic complications: no  No notable events documented.  Last Vitals:  Vitals:   04/17/23 1400 04/17/23 1407  BP: (!) 147/77 134/89  Pulse: 72 76  Resp: 16 16  Temp:  36.8 C  SpO2: 98% 100%    Last Pain:  Vitals:   04/17/23 1424  TempSrc:   PainSc: 5                  Electa Sterry,W. EDMOND

## 2023-04-18 ENCOUNTER — Other Ambulatory Visit: Payer: Self-pay | Admitting: Family

## 2023-04-18 DIAGNOSIS — Z96653 Presence of artificial knee joint, bilateral: Secondary | ICD-10-CM | POA: Diagnosis not present

## 2023-04-18 DIAGNOSIS — Z79899 Other long term (current) drug therapy: Secondary | ICD-10-CM | POA: Diagnosis not present

## 2023-04-18 DIAGNOSIS — M1611 Unilateral primary osteoarthritis, right hip: Secondary | ICD-10-CM | POA: Diagnosis not present

## 2023-04-18 DIAGNOSIS — I2510 Atherosclerotic heart disease of native coronary artery without angina pectoris: Secondary | ICD-10-CM | POA: Diagnosis not present

## 2023-04-18 DIAGNOSIS — Z7982 Long term (current) use of aspirin: Secondary | ICD-10-CM | POA: Diagnosis not present

## 2023-04-18 DIAGNOSIS — I10 Essential (primary) hypertension: Secondary | ICD-10-CM | POA: Diagnosis not present

## 2023-04-18 DIAGNOSIS — Z955 Presence of coronary angioplasty implant and graft: Secondary | ICD-10-CM | POA: Diagnosis not present

## 2023-04-18 DIAGNOSIS — Z87891 Personal history of nicotine dependence: Secondary | ICD-10-CM | POA: Diagnosis not present

## 2023-04-18 DIAGNOSIS — E119 Type 2 diabetes mellitus without complications: Secondary | ICD-10-CM | POA: Diagnosis not present

## 2023-04-18 LAB — BASIC METABOLIC PANEL
Anion gap: 6 (ref 5–15)
BUN: 14 mg/dL (ref 8–23)
CO2: 22 mmol/L (ref 22–32)
Calcium: 8.1 mg/dL — ABNORMAL LOW (ref 8.9–10.3)
Chloride: 108 mmol/L (ref 98–111)
Creatinine, Ser: 0.94 mg/dL (ref 0.44–1.00)
GFR, Estimated: >60 mL/min (ref >60)
Glucose, Bld: 124 mg/dL — ABNORMAL HIGH (ref 70–99)
Potassium: 3.8 mmol/L (ref 3.5–5.1)
Sodium: 136 mmol/L (ref 135–145)

## 2023-04-18 LAB — CBC
HCT: 32.9 % — ABNORMAL LOW (ref 36.0–46.0)
Hemoglobin: 10.3 g/dL — ABNORMAL LOW (ref 12.0–15.0)
MCH: 26.8 pg (ref 26.0–34.0)
MCHC: 31.3 g/dL (ref 30.0–36.0)
MCV: 85.7 fL (ref 80.0–100.0)
Platelets: 241 10*3/uL (ref 150–400)
RBC: 3.84 MIL/uL — ABNORMAL LOW (ref 3.87–5.11)
RDW: 14.2 % (ref 11.5–15.5)
WBC: 13.3 10*3/uL — ABNORMAL HIGH (ref 4.0–10.5)
nRBC: 0 % (ref 0.0–0.2)

## 2023-04-18 MED ORDER — OXYCODONE-ACETAMINOPHEN 7.5-325 MG PO TABS: 1.0000 | ORAL_TABLET | ORAL | 0 refills | Status: AC | PRN

## 2023-04-18 MED ORDER — FLUCONAZOLE 150 MG PO TABS: 150.0000 mg | ORAL_TABLET | Freq: Every day | ORAL | 1 refills | Status: DC

## 2023-04-18 MED ORDER — ASPIRIN 81 MG PO CHEW: 81.0000 mg | CHEWABLE_TABLET | Freq: Two times a day (BID) | ORAL | 0 refills | Status: AC

## 2023-04-18 MED ORDER — DOCUSATE SODIUM 100 MG PO CAPS: 100.0000 mg | ORAL_CAPSULE | Freq: Two times a day (BID) | ORAL | 0 refills | Status: AC

## 2023-04-18 MED ORDER — POLYETHYLENE GLYCOL 3350 17 G PO PACK: 17.0000 g | PACK | Freq: Every day | ORAL | 0 refills | Status: AC | PRN

## 2023-04-18 MED ORDER — SENNA 8.6 MG PO TABS: 2.0000 | ORAL_TABLET | Freq: Every day | ORAL | 0 refills | Status: AC

## 2023-04-18 MED ORDER — ONDANSETRON HCL 4 MG PO TABS: 4.0000 mg | ORAL_TABLET | Freq: Three times a day (TID) | ORAL | 0 refills | Status: DC | PRN

## 2023-04-18 MED ORDER — METHOCARBAMOL 500 MG PO TABS: 500.0000 mg | ORAL_TABLET | Freq: Four times a day (QID) | ORAL | 0 refills | Status: DC | PRN

## 2023-04-18 NOTE — Discharge Summary (Signed)
Physician Discharge Summary  Patient ID: DECLAN KINSTLER MRN: [id number] DOB/AGE: [birth year] 69 y.o.  Admit date: 04/17/2023 Discharge date: 04/18/2023  Admission Diagnoses:  Osteoarthritis of right hip  Discharge Diagnoses:  Principal Problem:   Osteoarthritis of right hip   Past Medical History:  Diagnosis Date   Abnormal EKG 07/16/2011   ACS (acute coronary syndrome) (HCC) 10/22/2019   Anemia    Benign microscopic hematuria    neg cystoscopy 11/12- dr. Brunilda Payor   Coronary artery disease    DJD (degenerative joint disease)    L KNEE   Family history of anesthesia complication    multiple family members with history of this   GERD (gastroesophageal reflux disease)    Hurthle cell neoplasm of thyroid    Hyperlipidemia LDL goal <70 10/23/2019   Hypertension    Hypertensive retinopathy    Malignant hyperthermia    NSTEMI (non-ST elevated myocardial infarction) (HCC) 10/22/2019   Numbness and tingling in left arm     Surgeries: Procedure(s): TOTAL HIP ARTHROPLASTY ANTERIOR APPROACH on 04/17/2023   Consultants (if any):   Discharged Condition: Improved  Hospital Course: MANUEL CORTESE is an 67 y.o. female who was admitted 04/17/2023 with a diagnosis of Osteoarthritis of right hip and went to the operating room on 04/17/2023 and underwent the above named procedures.    She was given perioperative antibiotics:  Anti-infectives (From admission, onward)    Start     Dose/Rate Route Frequency Ordered Stop   04/17/23 1600  ceFAZolin (ANCEF) IVPB 2g/100 mL premix        2 g 200 mL/hr over 30 Minutes Intravenous Every 6 hours 04/17/23 1415 04/17/23 2250   04/17/23 0730  ceFAZolin (ANCEF) IVPB 2g/100 mL premix        2 g 200 mL/hr over 30 Minutes Intravenous On call to O.R. 04/17/23 0720 04/17/23 0956       She was given sequential compression devices, early ambulation, and aspirin for DVT prophylaxis.  POD#1 She is doing great. She ambulated well with PT and was  discharged home.   She benefited maximally from the hospital stay and there were no complications.    Recent vital signs:  Vitals:   04/18/23 0202 04/18/23 0523  BP: (!) 169/83 (!) 151/73  Pulse: 78 72  Resp: 18 18  Temp: 98.2 F (36.8 C) 98.3 F (36.8 C)  SpO2: 100% 98%    Recent laboratory studies:  Lab Results  Component Value Date   HGB 10.3 (L) 04/18/2023   HGB 12.0 04/07/2023   HGB 13.0 02/17/2023   Lab Results  Component Value Date   WBC 13.3 (H) 04/18/2023   PLT 241 04/18/2023   Lab Results  Component Value Date   INR 1.0 02/17/2023   Lab Results  Component Value Date   NA 136 04/18/2023   K 3.8 04/18/2023   CL 108 04/18/2023   CO2 22 04/18/2023   BUN 14 04/18/2023   CREATININE 0.94 04/18/2023   GLUCOSE 124 (H) 04/18/2023     Allergies as of 04/18/2023       Reactions   Shrimp [shellfish Allergy] Itching   Rash and lips itching   Ace Inhibitors Cough   Cough   Aspirin Nausea And Vomiting   Azithromycin Rash        Medication List     STOP taking these medications    amoxicillin-clavulanate 875-125 MG tablet Commonly known as: AUGMENTIN   aspirin EC 81 MG tablet Replaced by: aspirin 81  MG chewable tablet   Azelastine HCl 137 MCG/SPRAY Soln   fluticasone 50 MCG/ACT nasal spray Commonly known as: FLONASE   Ventolin HFA 108 (90 Base) MCG/ACT inhaler Generic drug: albuterol       TAKE these medications    aspirin 81 MG chewable tablet Commonly known as: Aspirin Childrens Chew 1 tablet (81 mg total) by mouth 2 (two) times daily with a meal. Replaces: aspirin EC 81 MG tablet   carvedilol 12.5 MG tablet Commonly known as: COREG Take 1 tablet (12.5 mg total) by mouth 2 (two) times daily with a meal.   docusate sodium 100 MG capsule Commonly known as: Colace Take 1 capsule (100 mg total) by mouth 2 (two) times daily.   gabapentin 300 MG capsule Commonly known as: NEURONTIN TAKE 1 CAPSULE BY MOUTH DAILY IN THE MORNING, 1 AT  NOON AND 2 AT BEDTIME.   hydrALAZINE 50 MG tablet Commonly known as: APRESOLINE Take 50 mg by mouth 2 (two) times daily.   isosorbide mononitrate 30 MG 24 hr tablet Commonly known as: IMDUR Take 1 tablet (30 mg total) by mouth daily.   linaclotide 145 MCG Caps capsule Commonly known as: Linzess Take 1 capsule (145 mcg total) by mouth daily before breakfast. What changed:  when to take this reasons to take this   losartan 100 MG tablet Commonly known as: COZAAR TAKE 1 TABLET BY MOUTH EVERY DAY   methocarbamol 500 MG tablet Commonly known as: ROBAXIN Take 1 tablet (500 mg total) by mouth every 6 (six) hours as needed for muscle spasms. What changed:  when to take this reasons to take this   nitroGLYCERIN 0.4 MG SL tablet Commonly known as: NITROSTAT Place 1 tablet (0.4 mg total) under the tongue every 5 (five) minutes as needed for chest pain (CP or SOB).   ondansetron 4 MG tablet Commonly known as: Zofran Take 1 tablet (4 mg total) by mouth every 8 (eight) hours as needed for nausea or vomiting. What changed: when to take this   oxyCODONE-acetaminophen 7.5-325 MG tablet Commonly known as: Percocet Take 1 tablet by mouth every 4 (four) hours as needed for severe pain. What changed:  when to take this reasons to take this   pantoprazole 40 MG tablet Commonly known as: PROTONIX Take 1 tablet (40 mg total) by mouth daily.   polyethylene glycol 17 g packet Commonly known as: MiraLax Take 17 g by mouth daily as needed for mild constipation or moderate constipation.   potassium chloride SA 20 MEQ tablet Commonly known as: Klor-Con M20 TAKE 1 TABLET BY MOUTH TWICE A DAY   rosuvastatin 20 MG tablet Commonly known as: CRESTOR TAKE 1 TABLET BY MOUTH EVERY DAY   senna 8.6 MG Tabs tablet Commonly known as: SENOKOT Take 2 tablets (17.2 mg total) by mouth at bedtime for 15 days.               Discharge Care Instructions  (From admission, onward)            Start     Ordered   04/18/23 0000  Weight bearing as tolerated        04/18/23 0739   04/18/23 0000  Change dressing       Comments: Do not change your dressing.   04/18/23 0739              WEIGHT BEARING   Weight bearing as tolerated with assist device (walker, cane, etc) as directed, use it as long as  suggested by your surgeon or therapist, typically at least 4-6 weeks.   EXERCISES  Results after joint replacement surgery are often greatly improved when you follow the exercise, range of motion and muscle strengthening exercises prescribed by your doctor. Safety measures are also important to protect the joint from further injury. Any time any of these exercises cause you to have increased pain or swelling, decrease what you are doing until you are comfortable again and then slowly increase them. If you have problems or questions, call your caregiver or physical therapist for advice.   Rehabilitation is important following a joint replacement. After just a few days of immobilization, the muscles of the leg can become weakened and shrink (atrophy).  These exercises are designed to build up the tone and strength of the thigh and leg muscles and to improve motion. Often times heat used for twenty to thirty minutes before working out will loosen up your tissues and help with improving the range of motion but do not use heat for the first two weeks following surgery (sometimes heat can increase post-operative swelling).   These exercises can be done on a training (exercise) mat, on the floor, on a table or on a bed. Use whatever works the best and is most comfortable for you.    Use music or television while you are exercising so that the exercises are a pleasant break in your day. This will make your life better with the exercises acting as a break in your routine that you can look forward to.   Perform all exercises about fifteen times, three times per day or as directed.  You should  exercise both the operative leg and the other leg as well.  Exercises include:   Quad Sets - Tighten up the muscle on the front of the thigh (Quad) and hold for 5-10 seconds.   Straight Leg Raises - With your knee straight (if you were given a brace, keep it on), lift the leg to 60 degrees, hold for 3 seconds, and slowly lower the leg.  Perform this exercise against resistance later as your leg gets stronger.  Leg Slides: Lying on your back, slowly slide your foot toward your buttocks, bending your knee up off the floor (only go as far as is comfortable). Then slowly slide your foot back down until your leg is flat on the floor again.  Angel Wings: Lying on your back spread your legs to the side as far apart as you can without causing discomfort.  Hamstring Strength:  Lying on your back, push your heel against the floor with your leg straight by tightening up the muscles of your buttocks.  Repeat, but this time bend your knee to a comfortable angle, and push your heel against the floor.  You may put a pillow under the heel to make it more comfortable if necessary.   A rehabilitation program following joint replacement surgery can speed recovery and prevent re-injury in the future due to weakened muscles. Contact your doctor or a physical therapist for more information on knee rehabilitation.    CONSTIPATION  Constipation is defined medically as fewer than three stools per week and severe constipation as less than one stool per week.  Even if you have a regular bowel pattern at home, your normal regimen is likely to be disrupted due to multiple reasons following surgery.  Combination of anesthesia, postoperative narcotics, change in appetite and fluid intake all can affect your bowels.   YOU MUST use at  least one of the following options; they are listed in order of increasing strength to get the job done.  They are all available over the counter, and you may need to use some, POSSIBLY even all of  these options:    Drink plenty of fluids (prune juice may be helpful) and high fiber foods Colace 100 mg by mouth twice a day  Senokot for constipation as directed and as needed Dulcolax (bisacodyl), take with full glass of water  Miralax (polyethylene glycol) once or twice a day as needed.  If you have tried all these things and are unable to have a bowel movement in the first 3-4 days after surgery call either your surgeon or your primary doctor.    If you experience loose stools or diarrhea, hold the medications until you stool forms back up.  If your symptoms do not get better within 1 week or if they get worse, check with your doctor.  If you experience "the worst abdominal pain ever" or develop nausea or vomiting, please contact the office immediately for further recommendations for treatment.   ITCHING:  If you experience itching with your medications, try taking only a single pain pill, or even half a pain pill at a time.  You can also use Benadryl over the counter for itching or also to help with sleep.   TED HOSE STOCKINGS:  Use stockings on both legs until for at least 2 weeks or as directed by physician office. They may be removed at night for sleeping.  MEDICATIONS:  See your medication summary on the "After Visit Summary" that nursing will review with you.  You may have some home medications which will be placed on hold until you complete the course of blood thinner medication.  It is important for you to complete the blood thinner medication as prescribed.  PRECAUTIONS:  If you experience chest pain or shortness of breath - call 911 immediately for transfer to the hospital emergency department.   If you develop a fever greater that 101 F, purulent drainage from wound, increased redness or drainage from wound, foul odor from the wound/dressing, or calf pain - CONTACT YOUR SURGEON.                                                   FOLLOW-UP APPOINTMENTS:  If you do not already have  a post-op appointment, please call the office for an appointment to be seen by your surgeon.  Guidelines for how soon to be seen are listed in your "After Visit Summary", but are typically between 1-4 weeks after surgery.  OTHER INSTRUCTIONS:   Knee Replacement:  Do not place pillow under knee, focus on keeping the knee straight while resting. CPM instructions: 0-90 degrees, 2 hours in the morning, 2 hours in the afternoon, and 2 hours in the evening. Place foam block, curve side up under heel at all times except when in CPM or when walking.  DO NOT modify, tear, cut, or change the foam block in any way.   MAKE SURE YOU:  Understand these instructions.  Get help right away if you are not doing well or get worse.    Thank you for letting us be a part of your medical care team.  It is a privilege we respect greatly.  We hope these instructions will help you  stay on track for a fast and full recovery!   Diagnostic Studies: DG Pelvis Portable  Result Date: 04/17/2023 CLINICAL DATA:  Follow-up right hip replacement EXAM: PORTABLE PELVIS 1-2 VIEWS COMPARISON:  Earlier same day FINDINGS: Total hip arthroplasty on the right. No radiographically detectable complication. IMPRESSION: Good appearance following total hip arthroplasty on the right. Electronically Signed   By: Paulina Fusi M.D.   On: 04/17/2023 12:15   DG HIP UNILAT WITH PELVIS 2-3 VIEWS RIGHT  Result Date: 04/17/2023 CLINICAL DATA:  [id number] Surgery, elective [id number] EXAM: DG HIP (WITH OR WITHOUT PELVIS) 2-3V RIGHT COMPARISON:  None Available. FINDINGS: Intraoperative images during right hip arthroplasty. Normal alignment. No evidence of loosening or periprosthetic fracture. Expected soft tissue changes. Fluoroscopy time 15 seconds. Dose 2.1893 mGy. IMPRESSION: Intraoperative images during right hip arthroplasty. Normal alignment. Fluoroscopy time/dose as above. Electronically Signed   By: Caprice Renshaw M.D.   On: 04/17/2023 11:51   DG C-Arm 1-60  Min-No Report  Result Date: 04/17/2023 Fluoroscopy was utilized by the requesting physician.  No radiographic interpretation.   DG C-Arm 1-60 Min-No Report  Result Date: 04/17/2023 Fluoroscopy was utilized by the requesting physician.  No radiographic interpretation.    Disposition: Discharge disposition: 01-Home or Self Care       Discharge Instructions     Call MD / Call 911   Complete by: As directed    If you experience chest pain or shortness of breath, CALL 911 and be transported to the hospital emergency room.  If you develope a fever above 101 F, pus (white drainage) or increased drainage or redness at the wound, or calf pain, call your surgeon's office.   Change dressing   Complete by: As directed    Do not change your dressing.   Constipation Prevention   Complete by: As directed    Drink plenty of fluids.  Prune juice may be helpful.  You may use a stool softener, such as Colace (over the counter) 100 mg twice a day.  Use MiraLax (over the counter) for constipation as needed.   Diet - low sodium heart healthy   Complete by: As directed    Discharge instructions   Complete by: As directed    Elevate toes above nose. Use cryotherapy as needed for pain and swelling.   Driving restrictions   Complete by: As directed    No driving for 6 weeks   Increase activity slowly as tolerated   Complete by: As directed    Lifting restrictions   Complete by: As directed    No lifting for 6 weeks   Post-operative opioid taper instructions:   Complete by: As directed    POST-OPERATIVE OPIOID TAPER INSTRUCTIONS: It is important to wean off of your opioid medication as soon as possible. If you do not need pain medication after your surgery it is ok to stop day one. Opioids include: Codeine, Hydrocodone(Norco, Vicodin), Oxycodone(Percocet, oxycontin) and hydromorphone amongst others.  Long term and even short term use of opiods can cause: Increased pain  response Dependence Constipation Depression Respiratory depression And more.  Withdrawal symptoms can include Flu like symptoms Nausea, vomiting And more Techniques to manage these symptoms Hydrate well Eat regular healthy meals Stay active Use relaxation techniques(deep breathing, meditating, yoga) Do Not substitute Alcohol to help with tapering If you have been on opioids for less than two weeks and do not have pain than it is ok to stop all together.  Plan to wean off of opioids  This plan should start within one week post op of your joint replacement. Maintain the same interval or time between taking each dose and first decrease the dose.  Cut the total daily intake of opioids by one tablet each day Next start to increase the time between doses. The last dose that should be eliminated is the evening dose.      TED hose   Complete by: As directed    Use stockings (TED hose) for 2 weeks on both leg(s).  You may remove them at night for sleeping.   Weight bearing as tolerated   Complete by: As directed         Follow-up Information     Clois Dupes, PA-C. Go on 05/02/2023.   Specialty: Orthopedic Surgery Why: You are scheduled for follow up on Friday June 7 at 11:00am. Contact information: [address], [address] [place] [address] [phone]                  Signed: Clois Dupes 04/18/2023, 7:40 AM

## 2023-04-18 NOTE — TOC Transition Note (Signed)
Transition of Care [facility]) - CM/SW Discharge Note  Patient Details  Name: Jade [name] MRN: [id number] Date of Birth: [birth year]  [facility] CM/SW Contact:  Ewing Schlein, LCSW Phone Number: 04/18/2023, 9:37 AM  Clinical Narrative: Patient is expected to discharge home after working with PT. CSW met with patient to confirm discharge plan. Patient will go home with a home exercise program (HEP). Patient has a rolling walker and cane at home, so there are no DME needs at this time. TOC signing off.    Final next level of care: Home/Self Care Barriers to Discharge: No Barriers Identified  Patient Goals and CMS Choice Choice offered to / list presented to : NA  Discharge Plan and Services Additional resources added to the After Visit Summary for        DME Arranged: N/A DME Agency: NA  Social Determinants of Health (SDOH) Interventions SDOH Screenings   Food Insecurity: No Food Insecurity (04/17/2023)  Housing: Low Risk  (04/17/2023)  Transportation Needs: No Transportation Needs (04/17/2023)  Utilities: Not At Risk (04/17/2023)  Alcohol Screen: Low Risk  (09/06/2021)  Depression (PHQ2-9): Low Risk  (05/20/2022)  Financial Resource Strain: Low Risk  (09/06/2021)  Physical Activity: Inactive (09/06/2021)  Social Connections: Socially Integrated (09/06/2021)  Stress: No Stress Concern Present (09/06/2021)  Tobacco Use: Medium Risk (04/17/2023)   Readmission Risk Interventions     No data to display

## 2023-04-18 NOTE — Progress Notes (Signed)
Physical Therapy Treatment Patient Details Name: Jade [name] MRN: [id number] DOB: [birth year] Today's Date: 04/18/2023   History of Present Illness Pt s/p R THR and with hx of bil TKR    PT Comments    Pt has progressed functionally and appears ready for d/c home with spouse once medically cleared. Pt currently is at a Supervision level for bed mobility, transfers, and gait with support of RW. Pt has progressed to a "step through" gait pattern with increased weight tolerance through R LE. Exercise packet given and reviewed for continuance at home with good understanding. Pt has a ramp at home and is able to verbalize proper sequencing to safely negotiate stairs.    Recommendations for follow up therapy are one component of a multi-disciplinary discharge planning process, led by the attending physician.  Recommendations may be updated based on patient status, additional functional criteria and insurance authorization.  Follow Up Recommendations       Assistance Recommended at Discharge Intermittent Supervision/Assistance  Patient can return home with the following A little help with walking and/or transfers;A little help with bathing/dressing/bathroom;Assist for transportation;Help with stairs or ramp for entrance   Equipment Recommendations  None recommended by PT    Recommendations for Other Services       Precautions / Restrictions Precautions Precautions: Fall Restrictions Weight Bearing Restrictions: Yes RLE Weight Bearing: Weight bearing as tolerated     Mobility  Bed Mobility Overal bed mobility: Needs Assistance Bed Mobility: Supine to Sit     Supine to sit: Supervision     General bed mobility comments:  (no physical assist needed)    Transfers Overall transfer level: Needs assistance Equipment used: Rolling walker (2 wheels) Transfers: Sit to/from Stand Sit to Stand: Supervision           General transfer comment: Sit<>stand from toilet with  supervision    Ambulation/Gait Ambulation/Gait assistance: Supervision Gait Distance (Feet): 160 Feet Assistive device: Rolling walker (2 wheels) Gait Pattern/deviations: Step-through pattern, Decreased stance time - right Gait velocity: decr     General Gait Details: cues for sequence, posture and position from RW   Stairs Stairs:  (Pt has a ramp at home and is able to recall proper sequencing for stairs independently)           Wheelchair Mobility    Modified Rankin (Stroke Patients Only)       Balance                                            Cognition Arousal/Alertness: Awake/alert Behavior During Therapy: WFL for tasks assessed/performed Overall Cognitive Status: Within Functional Limits for tasks assessed                                          Exercises Total Joint Exercises Ankle Circles/Pumps: AROM, Both, 15 reps, Supine Quad Sets: AROM, Both, 10 reps Heel Slides: AROM, Right, 10 reps, Supine Long Arc Quad: AROM, Right, 10 reps, Seated Other Exercises Other Exercises:  (Pt given DA THA exercises and reviewed with good understanding)    General Comments General comments (skin integrity, edema, etc.): Discussed importance of continuing strengthening exercises at home and benefits of frequent ambulation throughout the day      Pertinent Vitals/Pain Pain Assessment Pain Assessment: 0-10  Pain Score: 4  Pain Location: R hip Pain Descriptors / Indicators: Aching, Sore Pain Intervention(s): Limited activity within patient's tolerance    Home Living                          Prior Function            PT Goals (current goals can now be found in the care plan section) Acute Rehab PT Goals Patient Stated Goal: Regain IND Progress towards PT goals: Progressing toward goals    Frequency    7X/week      PT Plan Current plan remains appropriate    Co-evaluation              AM-PAC PT "6  Clicks" Mobility   Outcome Measure  Help needed turning from your back to your side while in a flat bed without using bedrails?: A Little Help needed moving from lying on your back to sitting on the side of a flat bed without using bedrails?: A Little Help needed moving to and from a bed to a chair (including a wheelchair)?: A Little Help needed standing up from a chair using your arms (e.g., wheelchair or bedside chair)?: A Little Help needed to walk in hospital room?: A Little Help needed climbing 3-5 steps with a railing? : A Little 6 Click Score: 18    End of Session Equipment Utilized During Treatment: Gait belt Activity Tolerance: Patient tolerated treatment well Patient left: in chair;with call bell/phone within reach;with chair alarm set;with family/visitor present Nurse Communication: Mobility status PT Visit Diagnosis: Difficulty in walking, not elsewhere classified (R26.2)     Time: 4098-1191 PT Time Calculation (min) (ACUTE ONLY): 28 min  Charges:  $Gait Training: 8-22 mins $Therapeutic Exercise: 8-22 mins                    Zadie Cleverly, PTA  [name] 04/18/2023, 12:47 PM

## 2023-04-18 NOTE — Plan of Care (Signed)
Pt ready to DC home with husband 

## 2023-04-18 NOTE — Progress Notes (Signed)
    Subjective:  Patient reports pain as mild to moderate.  Denies N/V/CP/SOB/Abd pain. She states she is ready to go home. She denies any tingling or numbness in LE bilaterally.  Objective:   VITALS:   Vitals:   04/17/23 1820 04/17/23 2054 04/18/23 0202 04/18/23 0523  BP: (!) 148/71 (!) 156/72 (!) 169/83 (!) 151/73  Pulse: 85 88 78 72  Resp: 17 16 18 18   Temp: 97.8 F (36.6 C) 98 F (36.7 C) 98.2 F (36.8 C) 98.3 F (36.8 C)  TempSrc:  Oral Oral Oral  SpO2: 100% 100% 100% 98%  Weight:      Height:        Patient sitting up in bed. NAD.  Neurologically intact ABD soft Neurovascular intact Sensation intact distally Intact pulses distally Dorsiflexion/Plantar flexion intact Incision: dressing C/D/I No cellulitis present Compartment soft Painless log rolling of the hip.  Aquacel dressing peeling up, replaced this morning.   Lab Results  Component Value Date   WBC 13.3 (H) 04/18/2023   HGB 10.3 (L) 04/18/2023   HCT 32.9 (L) 04/18/2023   MCV 85.7 04/18/2023   PLT 241 04/18/2023   BMET    Component Value Date/Time   NA 136 04/18/2023 0400   NA 140 11/03/2019 1135   K 3.8 04/18/2023 0400   CL 108 04/18/2023 0400   CO2 22 04/18/2023 0400   GLUCOSE 124 (H) 04/18/2023 0400   BUN 14 04/18/2023 0400   BUN 10 11/03/2019 1135   CREATININE 0.94 04/18/2023 0400   CREATININE 0.83 11/07/2017 1617   CALCIUM 8.1 (L) 04/18/2023 0400   GFRNONAA >60 04/18/2023 0400   GFRNONAA >89 08/20/2013 0850     Assessment/Plan: 1 Day Post-Op   Principal Problem:   Osteoarthritis of right hip  ABLA. Hemoglobin 10.3. Asymptomatic. Continue to monitor.   WBAT with walker DVT ppx: Aspirin, SCDs, TEDS PO pain control PT/OT: Patient ambulated 95 feet with PT yesterday. Continue PT today.  Dispo: D/c home once cleared with PT.    Clois Dupes, PA-C 04/18/2023, 7:32 AM   [facility]  [place] [address], [address], [place], [place] [address] Phone:  [phone] [email or web address] Facebook  [employer]

## 2023-04-23 ENCOUNTER — Encounter (HOSPITAL_COMMUNITY): Payer: Self-pay | Admitting: Orthopedic Surgery

## 2023-04-25 ENCOUNTER — Other Ambulatory Visit (HOSPITAL_COMMUNITY): Payer: Medicare Other

## 2023-05-02 DIAGNOSIS — Z471 Aftercare following joint replacement surgery: Secondary | ICD-10-CM | POA: Diagnosis not present

## 2023-05-02 DIAGNOSIS — Z96641 Presence of right artificial hip joint: Secondary | ICD-10-CM | POA: Diagnosis not present

## 2023-05-08 ENCOUNTER — Telehealth: Payer: Self-pay

## 2023-05-08 NOTE — Patient Outreach (Signed)
  Care Coordination   05/08/2023 Name: LAURALI GODDARD MRN: [id number] DOB: 1956-08-19   Care Coordination Outreach Attempts:  An unsuccessful telephone outreach was attempted today to offer the patient information about available care coordination services.  Follow Up Plan:  Additional outreach attempts will be made to offer the patient care coordination information and services.   Encounter Outcome:  No Answer   Care Coordination Interventions:  No, not indicated    Kathyrn Sheriff, RN, MSN, BSN, CCM [facility] Care Coordinator [phone]

## 2023-05-15 ENCOUNTER — Telehealth: Payer: Self-pay

## 2023-05-15 NOTE — Patient Outreach (Signed)
  Care Coordination   Care Coordination  Visit Note   05/15/2023 Name: Jade [name] MRN: [id number] DOB: 08/01/1956  Jade [name] is a 67 y.o. year old female who sees Sandford Craze, NP for primary care. I spoke with  Jade [name] by phone today.  What matters to the patients health and wellness today?  RNCM called to discuss care coordination program. Ms. Laveck request to schedule a date/time.   Goals Addressed             This Visit's Progress    Care Coordination Activities       Interventions Today    Flowsheet Row Most Recent Value  General Interventions   General Interventions Discussed/Reviewed General Interventions Discussed  [briefly discussed care coordination program. scheduled telephone assessment call.]            SDOH assessments and interventions completed:  No  Care Coordination Interventions:  Yes, provided   Follow up plan: Follow up call scheduled for 05/22/23    Encounter Outcome:  Pt. Visit Completed   Kathyrn Sheriff, RN, MSN, BSN, CCM [facility] Care Coordinator [phone]

## 2023-05-16 IMAGING — CT CT ABD-PELV W/ CM
2 of 5 series · 17 of 46 positions shown, 19 images · IV contrast (omnipaque)
Comparison: 09/24/2011

CLINICAL DATA: Left lower quadrant abdominal pain

EXAM:
CT ABDOMEN AND PELVIS WITH CONTRAST
TECHNIQUE: Multidetector CT imaging of the abdomen and pelvis was performed
using the standard protocol following bolus administration of
intravenous contrast.
CONTRAST:  100mL OMNIPAQUE IOHEXOL 300 MG/ML  SOLN

[Series 2: axial st · axial · 0.98mm/px · z∈[-457,-67]mm · 14 of 88 slices shown, 16 images]
[im 5/88  soft-tissue]
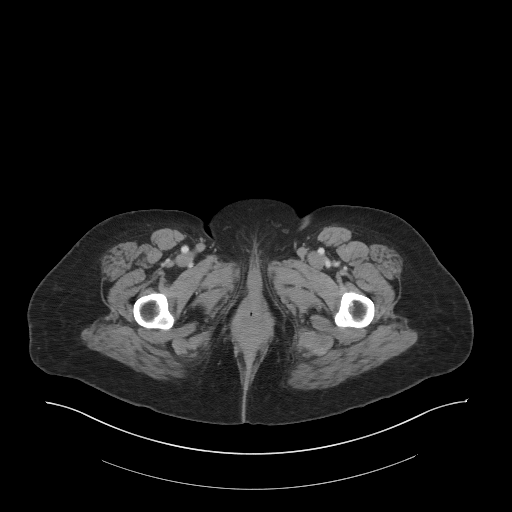
[im 5/88  bone]
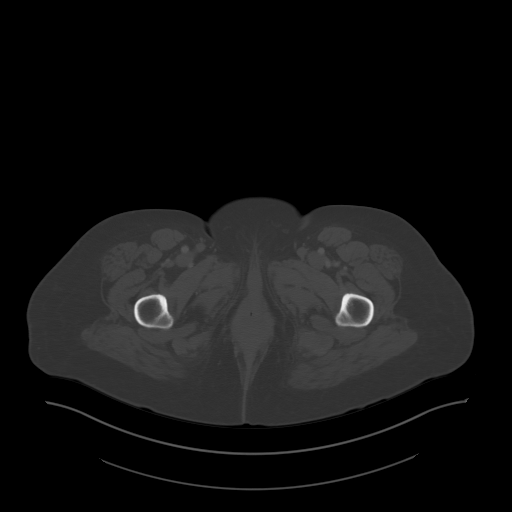
[im 10/88  soft-tissue]
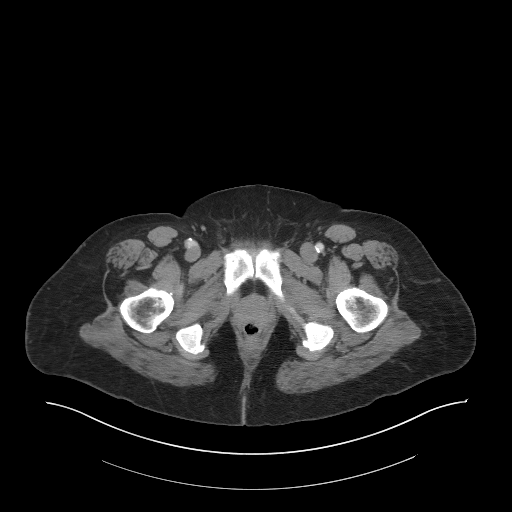
[im 19/88  soft-tissue]
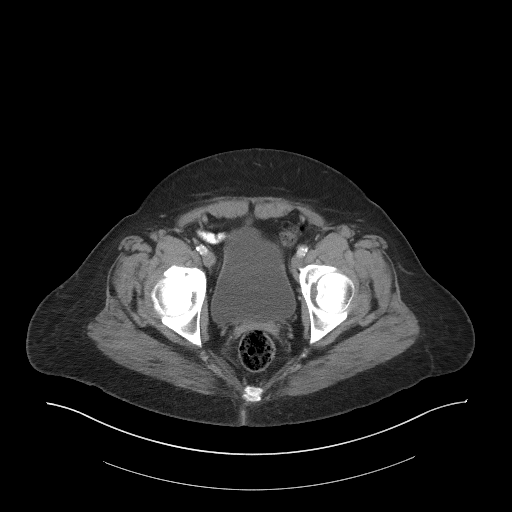
[im 23/88  soft-tissue]
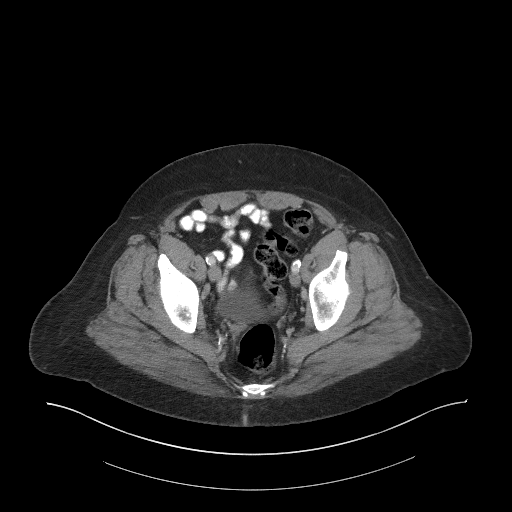
[im 28/88  soft-tissue]
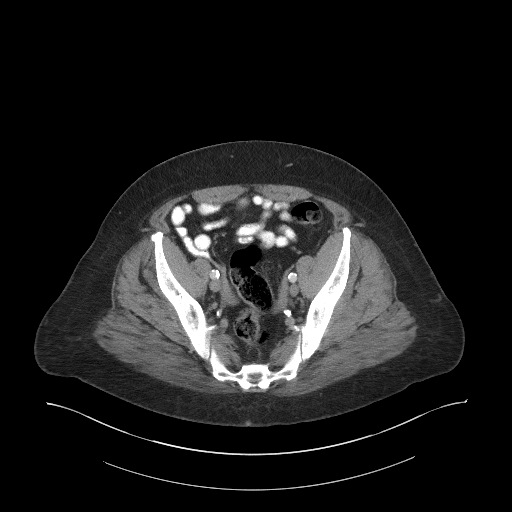
[im 37/88  soft-tissue]
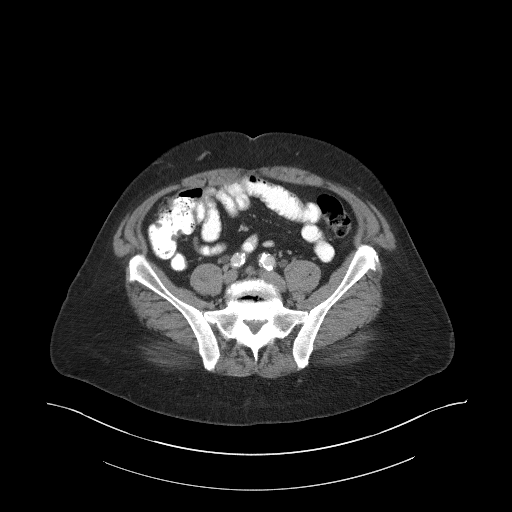
[im 42/88  soft-tissue]
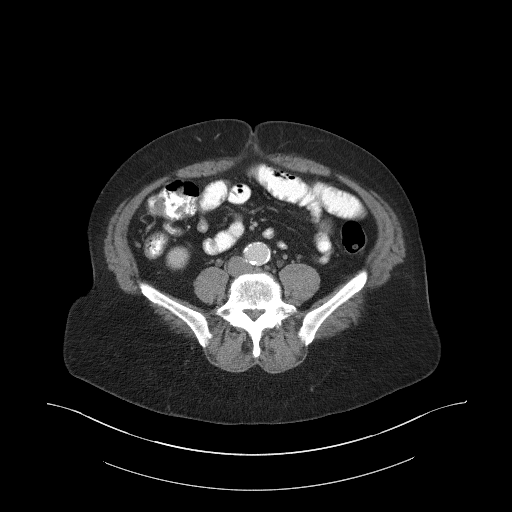
[im 46/88  soft-tissue]
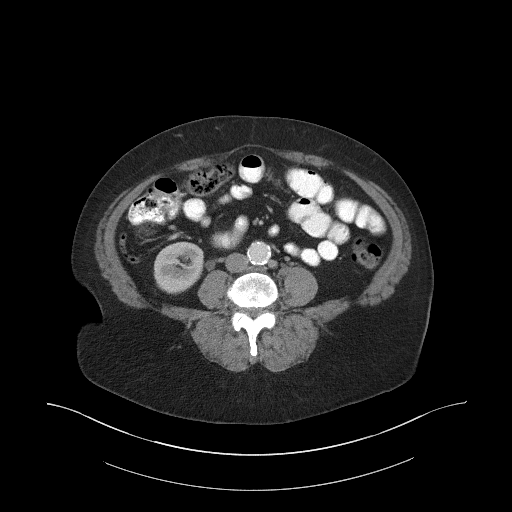
[im 51/88  soft-tissue]
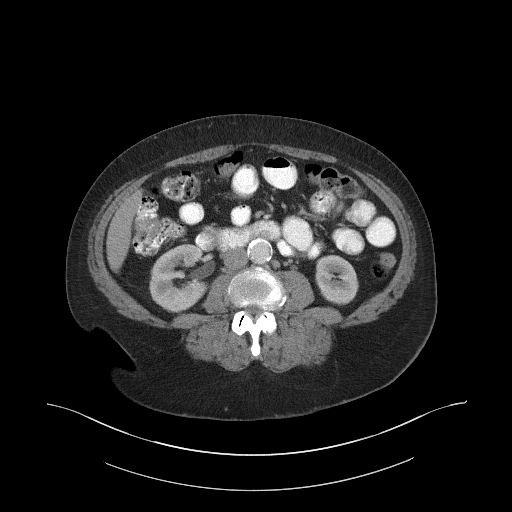
[im 51/88  bone]
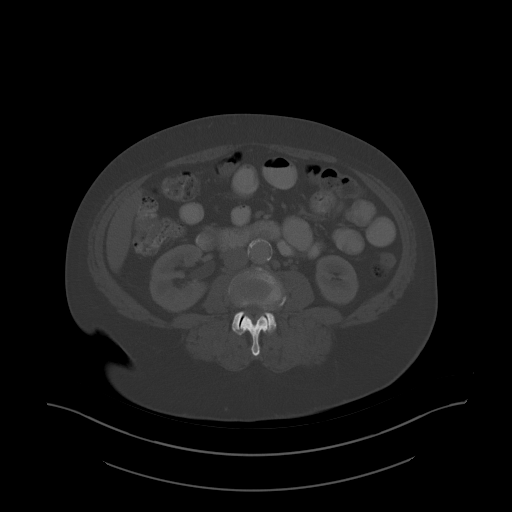
[im 60/88  soft-tissue]
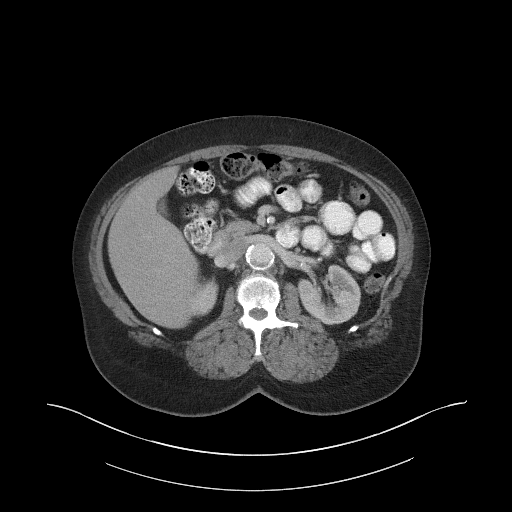
[im 65/88  soft-tissue]
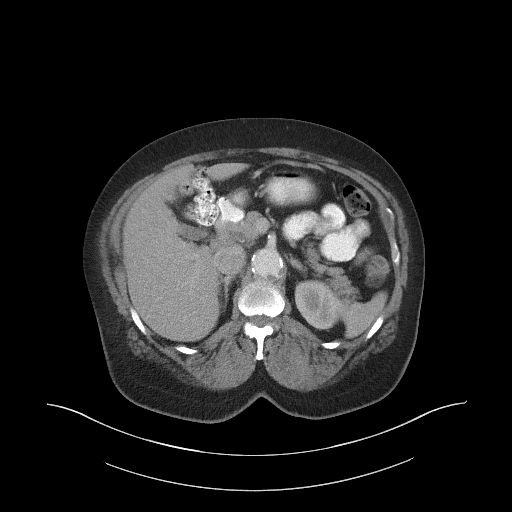
[im 69/88  soft-tissue]
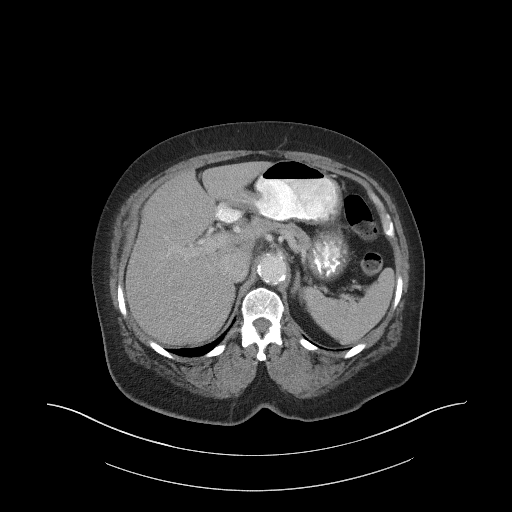
[im 78/88  soft-tissue]
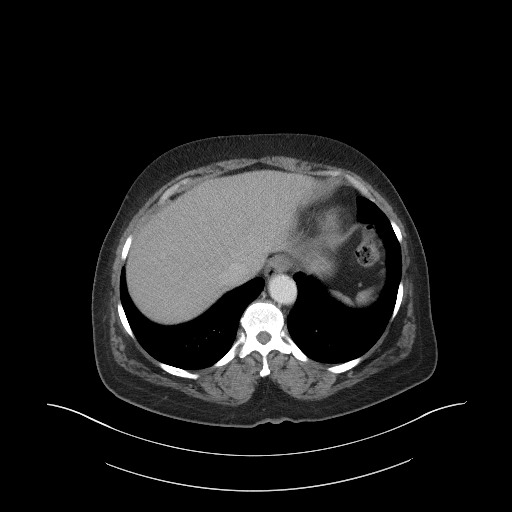
[im 83/88  soft-tissue]
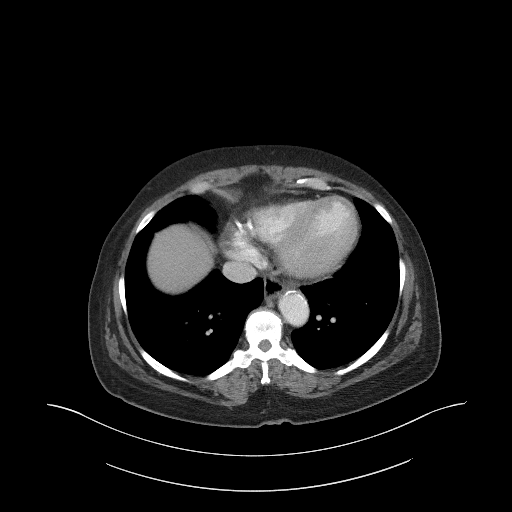

[Series 5: coronal st · coronal · 0.80mm/px · 3 of 101 slices shown]
[im 34/101  soft-tissue]
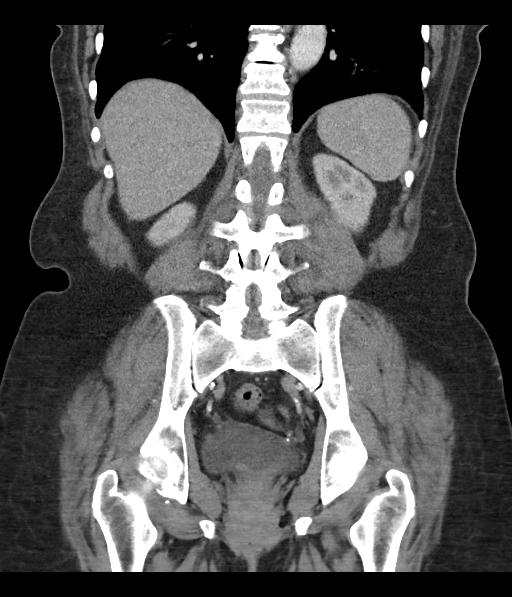
[im 45/101  soft-tissue]
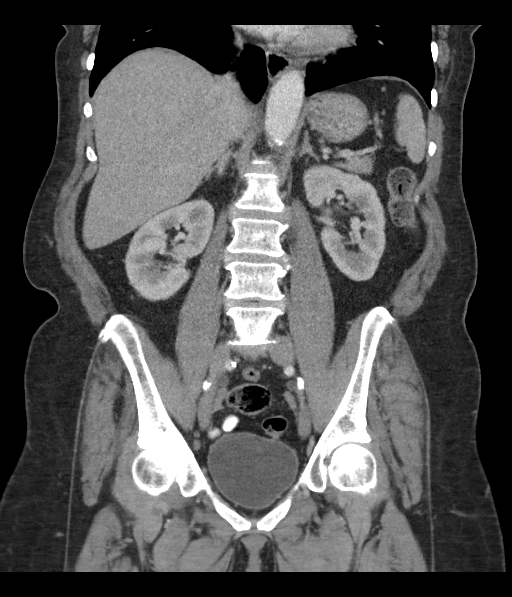
[im 56/101  soft-tissue]
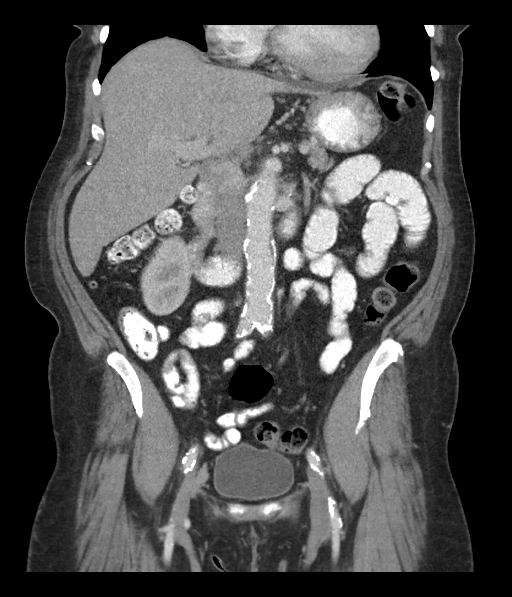

[17 of 46 positions shown; findings below may reference images not displayed]

FINDINGS: Lower chest: Calcifications in the visualized right coronary artery
and distal thoracic aorta. No acute abnormality.

Hepatobiliary: No focal hepatic abnormality. Gallbladder
unremarkable.

Pancreas: No focal abnormality or ductal dilatation.

Spleen: No focal abnormality.  Normal size.

Adrenals/Urinary Tract: No adrenal abnormality. No focal renal
abnormality. No stones or hydronephrosis. Urinary bladder is
unremarkable.

Stomach/Bowel: Normal appendix. Stomach, large and small bowel
grossly unremarkable.

Vascular/Lymphatic: No evidence of aneurysm or adenopathy. Aortic
atherosclerosis diffusely.

Reproductive: Prior hysterectomy.  No adnexal masses.

Other: No free fluid or free air.

Musculoskeletal: No acute bony abnormality.
IMPRESSION: No acute findings in the abdomen or pelvis.

Aortic atherosclerosis, coronary artery disease.

## 2023-05-22 ENCOUNTER — Telehealth: Payer: Self-pay

## 2023-05-22 NOTE — Patient Outreach (Signed)
  Care Coordination   05/22/2023 Name: Jade [name] MRN: [id number] DOB: February 04, 1956   Care Coordination Outreach Attempts:  An unsuccessful telephone outreach was attempted for a scheduled appointment today.  Follow Up Plan:  Additional outreach attempts will be made to offer the patient care coordination information and services.   Encounter Outcome:  No Answer   Care Coordination Interventions:  No, not indicated    Kathyrn Sheriff, RN, MSN, BSN, CCM [facility] Care Coordinator [phone]

## 2023-06-02 DIAGNOSIS — Z96641 Presence of right artificial hip joint: Secondary | ICD-10-CM | POA: Diagnosis not present

## 2023-06-03 ENCOUNTER — Ambulatory Visit (INDEPENDENT_AMBULATORY_CARE_PROVIDER_SITE_OTHER): Payer: Medicare Other | Admitting: Emergency Medicine

## 2023-06-03 VITALS — Ht 65.0 in | Wt 178.0 lb

## 2023-06-03 DIAGNOSIS — Z0000 Encounter for general adult medical examination without abnormal findings: Secondary | ICD-10-CM | POA: Diagnosis not present

## 2023-06-03 NOTE — Patient Instructions (Addendum)
Jade [name] , Thank you for taking time to come for your Medicare Wellness Visit. I appreciate your ongoing commitment to your health goals. Please review the following plan we discussed and let me know if I can assist you in the future.   These are the goals we discussed:  Goals       Care Coordination Activities      Interventions Today    Flowsheet Row Most Recent Value  General Interventions   General Interventions Discussed/Reviewed General Interventions Discussed  [briefly discussed care coordination program. scheduled telephone assessment call.]            DIET - INCREASE WATER INTAKE (pt-stated)      Wants to drink 64oz of water daily      Increase physical activity      Silver Sneakers and join gym.      Quit Smoking        This is a list of the screening recommended for you and due dates:  Health Maintenance  Topic Date Due   Zoster (Shingles) Vaccine (1 of 2) Never done   Screening for Lung Cancer  Never done   DTaP/Tdap/Td vaccine (3 - Td or Tdap) 07/14/2021   COVID-19 Vaccine (4 - 2023-24 season) 07/26/2022   Flu Shot  06/26/2023   Yearly kidney health urinalysis for diabetes  09/03/2023   Complete foot exam   09/03/2023   Eye exam for diabetics  09/04/2023   Hemoglobin A1C  10/08/2023   Yearly kidney function blood test for diabetes  04/17/2024   Mammogram  05/27/2024   Medicare Annual Wellness Visit  06/02/2024   Colon Cancer Screening  11/23/2028   Pneumonia Vaccine  Completed   DEXA scan (bone density measurement)  Completed   HPV Vaccine  Aged Out   Hepatitis C Screening  Discontinued    Advanced directives: Information on Advanced Care Planning can be found at [facility] of [place] Advance Health Care Directives Advance Health Care Directives ([email or web address]    Conditions/risks identified: Hepatitis C screening with next labs  Managing Pain Without Opioids Opioids are strong medicines used to treat moderate to severe pain. For some  people, especially those who have long-term (chronic) pain, opioids may not be the best choice for pain management due to: Side effects like nausea, constipation, and sleepiness. The risk of addiction (opioid use disorder). The longer you take opioids, the greater your risk of addiction. Pain that lasts for more than 3 months is called chronic pain. Managing chronic pain usually requires more than one approach and is often provided by a team of health care providers working together (multidisciplinary approach). Pain management may be done at a pain management center or pain clinic. How to manage pain without the use of opioids Use non-opioid medicines Non-opioid medicines for pain may include: Over-the-counter or prescription non-steroidal anti-inflammatory drugs (NSAIDs). These may be the first medicines used for pain. They work well for muscle and bone pain, and they reduce swelling. Acetaminophen. This over-the-counter medicine may work well for milder pain but not swelling. Antidepressants. These may be used to treat chronic pain. A certain type of antidepressant (tricyclics) is often used. These medicines are given in lower doses for pain than when used for depression. Anticonvulsants. These are usually used to treat seizures but may also reduce nerve (neuropathic) pain. Muscle relaxants. These relieve pain caused by sudden muscle tightening (spasms). You may also use a pain medicine that is applied to the skin as a patch,  cream, or gel (topical analgesic), such as a numbing medicine. These may cause fewer side effects than medicines taken by mouth. Do certain therapies as directed Some therapies can help with pain management. They include: Physical therapy. You will do exercises to gain strength and flexibility. A physical therapist may teach you exercises to move and stretch parts of your body that are weak, stiff, or painful. You can learn these exercises at physical therapy visits and  practice them at home. Physical therapy may also involve: Massage. Heat wraps or applying heat or cold to affected areas. Electrical signals that interrupt pain signals (transcutaneous electrical nerve stimulation, TENS). Weak lasers that reduce pain and swelling (low-level laser therapy). Signals from your body that help you learn to regulate pain (biofeedback). Occupational therapy. This helps you to learn ways to function at home and work with less pain. Recreational therapy. This involves trying new activities or hobbies, such as a physical activity or drawing. Mental health therapy, including: Cognitive behavioral therapy (CBT). This helps you learn coping skills for dealing with pain. Acceptance and commitment therapy (ACT) to change the way you think and react to pain. Relaxation therapies, including muscle relaxation exercises and mindfulness-based stress reduction. Pain management counseling. This may be individual, family, or group counseling.  Receive medical treatments Medical treatments for pain management include: Nerve block injections. These may include a pain blocker and anti-inflammatory medicines. You may have injections: Near the spine to relieve chronic back or neck pain. Into joints to relieve back or joint pain. Into nerve areas that supply a painful area to relieve body pain. Into muscles (trigger point injections) to relieve some painful muscle conditions. A medical device placed near your spine to help block pain signals and relieve nerve pain or chronic back pain (spinal cord stimulation device). Acupuncture. Follow these instructions at home Medicines Take over-the-counter and prescription medicines only as told by your health care provider. If you are taking pain medicine, ask your health care providers about possible side effects to watch out for. Do not drive or use heavy machinery while taking prescription opioid pain medicine. Lifestyle  Do not use drugs  or alcohol to reduce pain. If you drink alcohol, limit how much you have to: 0-1 drink a day for women who are not pregnant. 0-2 drinks a day for men. Know how much alcohol is in a drink. In the [place] one drink equals one 12 oz bottle of beer (355 mL), one 5 oz glass of wine (148 mL), or one 1 oz glass of hard liquor (44 mL). Do not use any products that contain nicotine or tobacco. These products include cigarettes, chewing tobacco, and vaping devices, such as e-cigarettes. If you need help quitting, ask your health care provider. Eat a healthy diet and maintain a healthy weight. Poor diet and excess weight may make pain worse. Eat foods that are high in fiber. These include fresh fruits and vegetables, whole grains, and beans. Limit foods that are high in fat and processed sugars, such as fried and sweet foods. Exercise regularly. Exercise lowers stress and may help relieve pain. Ask your health care provider what activities and exercises are safe for you. If your health care provider approves, join an exercise class that combines movement and stress reduction. Examples include yoga and tai chi. Get enough sleep. Lack of sleep may make pain worse. Lower stress as much as possible. Practice stress reduction techniques as told by your therapist. General instructions Work with all your pain management  providers to find the treatments that work best for you. You are an important member of your pain management team. There are many things you can do to reduce pain on your own. Consider joining an online or in-person support group for people who have chronic pain. Keep all follow-up visits. This is important. Where to find more information You can find more information about managing pain without opioids from: American Academy of Pain Medicine: painmed.org Institute for Chronic Pain: instituteforchronicpain.org American Chronic Pain Association: theacpa.org Contact a health care provider if: You  have side effects from pain medicine. Your pain gets worse or does not get better with treatments or home therapy. You are struggling with anxiety or depression. Summary Many types of pain can be managed without opioids. Chronic pain may respond better to pain management without opioids. Pain is best managed when you and a team of health care providers work together. Pain management without opioids may include non-opioid medicines, medical treatments, physical therapy, mental health therapy, and lifestyle changes. Tell your health care providers if your pain gets worse or is not being managed well enough. This information is not intended to replace advice given to you by your health care provider. Make sure you discuss any questions you have with your health care provider. Document Revised: 02/21/2021 Document Reviewed: 02/21/2021 Elsevier Patient Education  2024 Elsevier Inc.   Next appointment: Follow up in one year for your annual wellness visit 06/03/24   Preventive Care 65 Years and Older, Female Preventive care refers to lifestyle choices and visits with your health care provider that can promote health and wellness. What does preventive care include? A yearly physical exam. This is also called an annual well check. Dental exams once or twice a year. Routine eye exams. Ask your health care provider how often you should have your eyes checked. Personal lifestyle choices, including: Daily care of your teeth and gums. Regular physical activity. Eating a healthy diet. Avoiding tobacco and drug use. Limiting alcohol use. Practicing safe sex. Taking low-dose aspirin every day. Taking vitamin and mineral supplements as recommended by your health care provider. What happens during an annual well check? The services and screenings done by your health care provider during your annual well check will depend on your age, overall health, lifestyle risk factors, and family history of  disease. Counseling  Your health care provider may ask you questions about your: Alcohol use. Tobacco use. Drug use. Emotional well-being. Home and relationship well-being. Sexual activity. Eating habits. History of falls. Memory and ability to understand (cognition). Work and work Astronomer. Reproductive health. Screening  You may have the following tests or measurements: Height, weight, and BMI. Blood pressure. Lipid and cholesterol levels. These may be checked every 5 years, or more frequently if you are over 110 years old. Skin check. Lung cancer screening. You may have this screening every year starting at age 80 if you have a 30-pack-year history of smoking and currently smoke or have quit within the past 15 years. Fecal occult blood test (FOBT) of the stool. You may have this test every year starting at age 44. Flexible sigmoidoscopy or colonoscopy. You may have a sigmoidoscopy every 5 years or a colonoscopy every 10 years starting at age 13. Hepatitis C blood test. Hepatitis B blood test. Sexually transmitted disease (STD) testing. Diabetes screening. This is done by checking your blood sugar (glucose) after you have not eaten for a while (fasting). You may have this done every 1-3 years. Bone density scan.  This is done to screen for osteoporosis. You may have this done starting at age 23. Mammogram. This may be done every 1-2 years. Talk to your health care provider about how often you should have regular mammograms. Talk with your health care provider about your test results, treatment options, and if necessary, the need for more tests. Vaccines  Your health care provider may recommend certain vaccines, such as: Influenza vaccine. This is recommended every year. Tetanus, diphtheria, and acellular pertussis (Tdap, Td) vaccine. You may need a Td booster every 10 years. Zoster vaccine. You may need this after age 77. Pneumococcal 13-valent conjugate (PCV13) vaccine. One  dose is recommended after age 63. Pneumococcal polysaccharide (PPSV23) vaccine. One dose is recommended after age 2. Talk to your health care provider about which screenings and vaccines you need and how often you need them. This information is not intended to replace advice given to you by your health care provider. Make sure you discuss any questions you have with your health care provider. Document Released: 12/08/2015 Document Revised: 07/31/2016 Document Reviewed: 09/12/2015 Elsevier Interactive Patient Education  2017 [employer].  Fall Prevention in the Home Falls can cause injuries. They can happen to people of all ages. There are many things you can do to make your home safe and to help prevent falls. What can I do on the outside of my home? Regularly fix the edges of walkways and driveways and fix any cracks. Remove anything that might make you trip as you walk through a door, such as a raised step or threshold. Trim any bushes or trees on the path to your home. Use bright outdoor lighting. Clear any walking paths of anything that might make someone trip, such as rocks or tools. Regularly check to see if handrails are loose or broken. Make sure that both sides of any steps have handrails. Any raised decks and porches should have guardrails on the edges. Have any leaves, snow, or ice cleared regularly. Use sand or salt on walking paths during winter. Clean up any spills in your garage right away. This includes oil or grease spills. What can I do in the bathroom? Use night lights. Install grab bars by the toilet and in the tub and shower. Do not use towel bars as grab bars. Use non-skid mats or decals in the tub or shower. If you need to sit down in the shower, use a plastic, non-slip stool. Keep the floor dry. Clean up any water that spills on the floor as soon as it happens. Remove soap buildup in the tub or shower regularly. Attach bath mats securely with double-sided  non-slip rug tape. Do not have throw rugs and other things on the floor that can make you trip. What can I do in the bedroom? Use night lights. Make sure that you have a light by your bed that is easy to reach. Do not use any sheets or blankets that are too big for your bed. They should not hang down onto the floor. Have a firm chair that has side arms. You can use this for support while you get dressed. Do not have throw rugs and other things on the floor that can make you trip. What can I do in the kitchen? Clean up any spills right away. Avoid walking on wet floors. Keep items that you use a lot in easy-to-reach places. If you need to reach something above you, use a strong step stool that has a grab bar. Keep electrical cords  out of the way. Do not use floor polish or wax that makes floors slippery. If you must use wax, use non-skid floor wax. Do not have throw rugs and other things on the floor that can make you trip. What can I do with my stairs? Do not leave any items on the stairs. Make sure that there are handrails on both sides of the stairs and use them. Fix handrails that are broken or loose. Make sure that handrails are as long as the stairways. Check any carpeting to make sure that it is firmly attached to the stairs. Fix any carpet that is loose or worn. Avoid having throw rugs at the top or bottom of the stairs. If you do have throw rugs, attach them to the floor with carpet tape. Make sure that you have a light switch at the top of the stairs and the bottom of the stairs. If you do not have them, ask someone to add them for you. What else can I do to help prevent falls? Wear shoes that: Do not have high heels. Have rubber bottoms. Are comfortable and fit you well. Are closed at the toe. Do not wear sandals. If you use a stepladder: Make sure that it is fully opened. Do not climb a closed stepladder. Make sure that both sides of the stepladder are locked into place. Ask  someone to hold it for you, if possible. Clearly mark and make sure that you can see: Any grab bars or handrails. First and last steps. Where the edge of each step is. Use tools that help you move around (mobility aids) if they are needed. These include: Canes. Walkers. Scooters. Crutches. Turn on the lights when you go into a dark area. Replace any light bulbs as soon as they burn out. Set up your furniture so you have a clear path. Avoid moving your furniture around. If any of your floors are uneven, fix them. If there are any pets around you, be aware of where they are. Review your medicines with your doctor. Some medicines can make you feel dizzy. This can increase your chance of falling. Ask your doctor what other things that you can do to help prevent falls. This information is not intended to replace advice given to you by your health care provider. Make sure you discuss any questions you have with your health care provider. Document Released: 09/07/2009 Document Revised: 04/18/2016 Document Reviewed: 12/16/2014 Elsevier Interactive Patient Education  2017 [employer].

## 2023-06-03 NOTE — Progress Notes (Signed)
Subjective:   Jade [name] is a 67 y.o. female who presents for Medicare Annual (Subsequent) preventive examination.  Visit Complete: Virtual  I connected with  Jade [name] on 06/03/23 by a audio enabled telemedicine application and verified that I am speaking with the correct person using two identifiers.  Patient Location: Home  Provider Location: Home Office  I discussed the limitations of evaluation and management by telemedicine. The patient expressed understanding and agreed to proceed.   Review of Systems     Cardiac Risk Factors include: advanced age (>58men, >33 women);hypertension;Other (see comment), Risk factor comments: previous smoker, CAD     Objective:    Today's Vitals   06/03/23 1116  Weight: 178 lb (80.7 kg)  Height: 5\' 5"  (1.651 m)  PainSc: 7    Body mass index is 29.62 kg/m.     06/03/2023   11:52 AM 04/17/2023    7:39 AM 04/07/2023   11:12 AM 09/04/2022    8:50 AM 09/06/2021    9:02 AM 08/23/2020    5:06 PM 10/21/2019   11:41 PM  Advanced Directives  Does Patient Have a Medical Advance Directive? No No No No No No No  Would patient like information on creating a medical advance directive? Yes (MAU/Ambulatory/Procedural Areas - Information given) No - Patient declined No - Patient declined No - Patient declined Yes (MAU/Ambulatory/Procedural Areas - Information given)  No - Patient declined    Current Medications (verified) Outpatient Encounter Medications as of 06/03/2023  Medication Sig   aspirin EC 81 MG tablet Take 81 mg by mouth daily. Swallow whole.   carvedilol (COREG) 12.5 MG tablet Take 1 tablet (12.5 mg total) by mouth 2 (two) times daily with a meal.   gabapentin (NEURONTIN) 300 MG capsule TAKE 1 CAPSULE BY MOUTH DAILY IN THE MORNING, 1 AT NOON AND 2 AT BEDTIME.   hydrALAZINE (APRESOLINE) 50 MG tablet Take 50 mg by mouth 2 (two) times daily.   isosorbide mononitrate (IMDUR) 30 MG 24 hr tablet Take 1 tablet (30 mg total) by mouth  daily.   linaclotide (LINZESS) 145 MCG CAPS capsule Take 1 capsule (145 mcg total) by mouth daily before breakfast. (Patient taking differently: Take 145 mcg by mouth daily as needed (constipation).)   losartan (COZAAR) 100 MG tablet TAKE 1 TABLET BY MOUTH EVERY DAY   methocarbamol (ROBAXIN) 500 MG tablet Take 1 tablet (500 mg total) by mouth every 6 (six) hours as needed for muscle spasms.   nitroGLYCERIN (NITROSTAT) 0.4 MG SL tablet Place 1 tablet (0.4 mg total) under the tongue every 5 (five) minutes as needed for chest pain (CP or SOB).   oxyCODONE-acetaminophen (PERCOCET) 7.5-325 MG tablet Take 1 tablet by mouth every 4 (four) hours as needed for severe pain.   pantoprazole (PROTONIX) 40 MG tablet Take 1 tablet (40 mg total) by mouth daily.   potassium chloride SA (KLOR-CON M20) 20 MEQ tablet TAKE 1 TABLET BY MOUTH TWICE A DAY   rosuvastatin (CRESTOR) 20 MG tablet TAKE 1 TABLET BY MOUTH EVERY DAY   fluconazole (DIFLUCAN) 150 MG tablet Take 1 tablet (150 mg total) by mouth daily. (Patient not taking: Reported on 06/03/2023)   ondansetron (ZOFRAN) 4 MG tablet Take 1 tablet (4 mg total) by mouth every 8 (eight) hours as needed for nausea or vomiting. (Patient not taking: Reported on 06/03/2023)   No facility-administered encounter medications on file as of 06/03/2023.    Allergies (verified) Shrimp [shellfish allergy], Ace inhibitors, Aspirin, and  Azithromycin   History: Past Medical History:  Diagnosis Date   Abnormal EKG 07/16/2011   ACS (acute coronary syndrome) (HCC) 10/22/2019   Anemia    Benign microscopic hematuria    neg cystoscopy 11/12- dr. Brunilda Payor   Coronary artery disease    DJD (degenerative joint disease)    L KNEE   Family history of anesthesia complication    multiple family members with history of this   GERD (gastroesophageal reflux disease)    Hurthle cell neoplasm of thyroid    Hyperlipidemia LDL goal <70 10/23/2019   Hypertension    Hypertensive retinopathy     Malignant hyperthermia    NSTEMI (non-ST elevated myocardial infarction) (HCC) 10/22/2019   Numbness and tingling in left arm    Past Surgical History:  Procedure Laterality Date   ABDOMINAL HYSTERECTOMY  1996   BREAST EXCISIONAL BIOPSY Left 01/2016   BREAST EXCISIONAL BIOPSY Left 03/2016   CARDIAC CATHETERIZATION     CERVICAL DISC SURGERY  2013   Dr. Lovell Sheehan   COLONOSCOPY  2010   [place]normal exam   CORONARY BALLOON ANGIOPLASTY N/A 10/25/2019   Procedure: CORONARY BALLOON ANGIOPLASTY;  Surgeon: Lennette Bihari, MD;  Location: [facility];  Service: Cardiovascular;  Laterality: N/A;   CORONARY STENT INTERVENTION N/A 10/22/2019   Procedure: CORONARY STENT INTERVENTION;  Surgeon: Lennette Bihari, MD;  Location: [facility];  Service: Cardiovascular;  Laterality: N/A;  mid lad    CORONARY STENT INTERVENTION N/A 10/25/2019   Procedure: CORONARY STENT INTERVENTION;  Surgeon: Lennette Bihari, MD;  Location: [facility];  Service: Cardiovascular;  Laterality: N/A;   DILATION AND CURETTAGE OF UTERUS     KNEE CARTILAGE SURGERY  1999   right knee   LEFT HEART CATH AND CORONARY ANGIOGRAPHY N/A 10/22/2019   Procedure: LEFT HEART CATH AND CORONARY ANGIOGRAPHY;  Surgeon: Lennette Bihari, MD;  Location: [facility];  Service: Cardiovascular;  Laterality: N/A;   ROTATOR CUFF REPAIR Right 05/25/2013   TOTAL HIP ARTHROPLASTY Right 04/17/2023   Procedure: TOTAL HIP ARTHROPLASTY ANTERIOR APPROACH;  Surgeon: Samson Frederic, MD;  Location: [facility];  Service: Orthopedics;  Laterality: Right;   TOTAL KNEE ARTHROPLASTY Left 01/20/2014   Procedure: LEFT TOTAL KNEE ARTHROPLASTY;  Surgeon: Javier Docker, MD;  Location: [facility];  Service: Orthopedics;  Laterality: Left;   TOTAL KNEE ARTHROPLASTY Right 01/19/2015   Procedure: RIGHT TOTAL KNEE ARTHROPLASTY;  Surgeon: Javier Docker, MD;  Location: [facility];  Service: Orthopedics;  Laterality: Right;   TUBAL LIGATION      Family History  Problem Relation Age of Onset   Hypertension Mother    Hyperlipidemia Mother    Heart disease Father        CAD; stent at age 26   Diabetes Father    Hypertension Father    Hyperlipidemia Father    Cancer Father        prostate   Dementia Father    Osteoarthritis Sister    Hypertension Sister    COPD Sister    CAD Sister        s/p stent   Edema Sister        cause unknown   Osteoporosis Sister        bad hips, bed bound, has one leg amputated   Drug abuse Sister    Dementia Paternal Grandmother    Cancer Paternal Aunt        BREAST  Breast cancer Paternal Aunt    Colon cancer Neg Hx    Social History   Socioeconomic History   Marital status: Married    Spouse name: Chrissie Noa   Number of children: 3   Years of education: Not on file   Highest education level: Not on file  Occupational History    Comment: works part-time at [employer]  Tobacco Use   Smoking status: Former    Packs/day: 0.50    Years: 45.00    Additional pack years: 0.00    Total pack years: 22.50    Types: Cigarettes    Quit date: 01/24/2023    Years since quitting: 0.3   Smokeless tobacco: Never  Vaping Use   Vaping Use: Never used  Substance and Sexual Activity   Alcohol use: No    Alcohol/week: 0.0 standard drinks of alcohol   Drug use: No   Sexual activity: Yes    Partners: Male  Other Topics Concern   Not on file  Social History Narrative   Regular exercise:  Patient was walking at work 4 times per week until hip replacement surgery 03/2023   Caffeine Use:  2 glasses of soda daily.   Married, 3 children- grown   Completed 12th grade.   Social Determinants of Health   Financial Resource Strain: Low Risk  (06/03/2023)   Overall Financial Resource Strain (CARDIA)    Difficulty of Paying Living Expenses: Not hard at all  Food Insecurity: No Food Insecurity (06/03/2023)   Hunger Vital Sign    Worried About Running Out of Food in the Last Year: Never true    Ran Out of  Food in the Last Year: Never true  Transportation Needs: No Transportation Needs (06/03/2023)   PRAPARE - Administrator, Civil Service (Medical): No    Lack of Transportation (Non-Medical): No  Physical Activity: Insufficiently Active (06/03/2023)   Exercise Vital Sign    Days of Exercise per Week: 4 days    Minutes of Exercise per Session: 10 min  Stress: No Stress Concern Present (06/03/2023)   [employer] of Occupational Health - Occupational Stress Questionnaire    Feeling of Stress : Not at all  Social Connections: Socially Integrated (06/03/2023)   Social Connection and Isolation Panel [NHANES]    Frequency of Communication with Friends and Family: More than three times a week    Frequency of Social Gatherings with Friends and Family: Once a week    Attends Religious Services: More than 4 times per year    Active Member of [employer] or Organizations: Yes    Attends Engineer, structural: More than 4 times per year    Marital Status: Married    Tobacco Counseling Counseling given: Not Answered   Clinical Intake:  Pre-visit preparation completed: Yes  Pain : 0-10 Pain Score: 7  Pain Type: Chronic pain Pain Location: Back Pain Descriptors / Indicators: Aching Pain Onset: More than a month ago Pain Frequency: Intermittent     BMI - recorded: 29.62 Nutritional Status: BMI 25 -29 Overweight Nutritional Risks: None Diabetes: No (last A1c 5.9)  How often do you need to have someone help you when you read instructions, pamphlets, or other written materials from your doctor or pharmacy?: 1 - Never What is the last grade level you completed in school?: 12th grade  Interpreter Needed?: No  Information entered by :: Tora Kindred, CMA   Activities of Daily Living    06/03/2023   11:22 AM 04/17/2023  2:40 PM  In your present state of health, do you have any difficulty performing the following activities:  Hearing? 0   Vision? 0   Difficulty  concentrating or making decisions? 0   Walking or climbing stairs? 1   Comment recent hip replacement   Dressing or bathing? 0   Doing errands, shopping? 0 0  Preparing Food and eating ? N   Using the Toilet? N   In the past six months, have you accidently leaked urine? N   Do you have problems with loss of bowel control? N   Managing your Medications? N   Managing your Finances? N   Housekeeping or managing your Housekeeping? N     Patient Care Team: Sandford Craze, NP as PCP - General (Internal Medicine) Colletta Maryland, RN as [facility] Care Management [place], Hazeline Junker, NP as Nurse Practitioner (Cardiology) Samson Frederic, MD as Consulting Physician (Orthopedic Surgery)  Indicate any recent Medical Services you may have received from other than [facility] providers in the past year (date may be approximate).     Assessment:   This is a routine wellness examination for Millen.  Hearing/Vision screen Hearing Screening - Comments:: Denies hearing difficulty  Dietary issues and exercise activities discussed:     Goals Addressed               This Visit's Progress     DIET - INCREASE WATER INTAKE (pt-stated)        Wants to drink 64oz of water daily      Increase physical activity   Not on track     Silver Sneakers and join gym.      Quit Smoking   On track     Depression Screen    06/03/2023   11:48 AM 05/20/2022   11:13 AM 09/06/2021    9:05 AM 08/25/2020    1:05 PM 02/08/2019   11:46 AM 11/07/2017    3:24 PM 10/09/2016   10:19 AM  PHQ 2/9 Scores  PHQ - 2 Score 0 0 0 3 0 0 0  PHQ- 9 Score 0   4 0 0     Fall Risk    06/03/2023   11:53 AM 05/20/2022   11:13 AM 09/06/2021    9:04 AM 03/19/2021   10:30 AM 11/30/2019   11:32 AM  Fall Risk   Falls in the past year? 0 0 0 1 0  Number falls in past yr: 0 0 0 0 0  Injury with Fall? 0 0 0  0  Risk for fall due to : Medication side effect;Impaired vision;Impaired balance/gait      Follow up  Falls evaluation completed;Education provided  Falls prevention discussed      MEDICARE RISK AT HOME:  Medicare Risk at Home - 06/03/23 1154     Any stairs in or around the home? No   has a ramp   If so, are there any without handrails? No    Home free of loose throw rugs in walkways, pet beds, electrical cords, etc? Yes    Adequate lighting in your home to reduce risk of falls? Yes    Life alert? No    Use of a cane, walker or w/c? Yes    Grab bars in the bathroom? Yes    Shower chair or bench in shower? No    Elevated toilet seat or a handicapped toilet? No  TIMED UP AND GO:  Was the test performed?  No    Cognitive Function:        06/03/2023   11:55 AM  6CIT Screen  What Year? 0 points  What month? 0 points  What time? 0 points  Count back from 20 2 points  Months in reverse 0 points  Repeat phrase 0 points  Total Score 2 points    Immunizations Immunization History  Administered Date(s) Administered   Fluad Quad(high Dose 65+) 10/13/2019, 08/25/2020, 10/03/2021, 09/02/2022   Influenza Split 10/11/2011   Influenza,inj,Quad PF,6+ Mos 08/17/2013, 08/29/2014, 08/08/2015, 10/16/2016, 08/18/2017, 08/10/2018   PFIZER(Purple Top)SARS-COV-2 Vaccination 01/01/2020, 01/22/2020, 09/22/2020   PNEUMOCOCCAL CONJUGATE-20 10/03/2021, 09/02/2022   Pneumococcal Polysaccharide-23 04/20/2013, 10/13/2019   Td 11/25/2006   Tdap 07/15/2011   Zoster, Live 11/06/2016    TDAP status: Due, Education has been provided regarding the importance of this vaccine. Advised may receive this vaccine at local pharmacy or Health Dept. Aware to provide a copy of the vaccination record if obtained from local pharmacy or Health Dept. Verbalized acceptance and understanding.  Flu Vaccine status: Up to date  Pneumococcal vaccine status: Up to date  Covid-19 vaccine status: Completed vaccines  Qualifies for Shingles Vaccine? No   Zostavax completed No   Shingrix Completed?: No.     Education has been provided regarding the importance of this vaccine. Patient has been advised to call insurance company to determine out of pocket expense if they have not yet received this vaccine. Advised may also receive vaccine at local pharmacy or Health Dept. Verbalized acceptance and understanding.  Screening Tests Health Maintenance  Topic Date Due   Zoster Vaccines- Shingrix (1 of 2) Never done   Lung Cancer Screening  Never done   DTaP/Tdap/Td (3 - Td or Tdap) 07/14/2021   COVID-19 Vaccine (4 - 2023-24 season) 07/26/2022   MAMMOGRAM  05/28/2023   INFLUENZA VACCINE  06/26/2023   Diabetic kidney evaluation - Urine ACR  09/03/2023   FOOT EXAM  09/03/2023   OPHTHALMOLOGY EXAM  09/04/2023   DEXA SCAN  09/18/2023   HEMOGLOBIN A1C  10/08/2023   Diabetic kidney evaluation - eGFR measurement  04/17/2024   Medicare Annual Wellness (AWV)  06/02/2024   Colonoscopy  11/23/2028   Pneumonia Vaccine 15+ Years old  Completed   HPV VACCINES  Aged Out   Hepatitis C Screening  Discontinued    Health Maintenance  Health Maintenance Due  Topic Date Due   Zoster Vaccines- Shingrix (1 of 2) Never done   Lung Cancer Screening  Never done   DTaP/Tdap/Td (3 - Td or Tdap) 07/14/2021   COVID-19 Vaccine (4 - 2023-24 season) 07/26/2022   MAMMOGRAM  05/28/2023    Colorectal cancer screening: Type of screening: Colonoscopy. Completed 11/23/21. Repeat every 7 years  Mammogram scheduled for 06/23/23  Bone Density status: Completed 09/17/21. Results reflect: Bone density results: NORMAL. Repeat every 2 years.  Lung Cancer Screening: (Low Dose CT Chest recommended if Age 46-80 years, 20 pack-year currently smoking OR have quit w/in 15years.) does qualify.   Lung Cancer Screening Referral: patient declined. Wants to discuss with PCP  Additional Screening:  Hepatitis C Screening: does qualify; Completed to be done with next labs  Vision Screening: Recommended annual ophthalmology exams for early  detection of glaucoma and other disorders of the eye. Is the patient up to date with their annual eye exam?  Yes  Who is the provider or what is the name of the office in which  the patient attends annual eye exams? Patient could not remember the name of eye doctor.  If pt is not established with a provider, would they like to be referred to a provider to establish care? No .   Dental Screening: Recommended annual dental exams for proper oral hygiene  Diabetic Foot Exam: Diabetic Foot Exam: Completed 09/02/22    Community Resource Referral / Chronic Care Management: CRR required this visit?  No   CCM required this visit?  No     Plan:     I have personally reviewed and noted the following in the patient's chart:   Medical and social history Use of alcohol, tobacco or illicit drugs  Current medications and supplements including opioid prescriptions. Patient is currently taking opioid prescriptions. Information provided to patient regarding non-opioid alternatives. Patient advised to discuss non-opioid treatment plan with their provider. Functional ability and status Nutritional status Physical activity Advanced directives List of other physicians Hospitalizations, surgeries, and ER visits in previous 12 months Vitals Screenings to include cognitive, depression, and falls Referrals and appointments  In addition, I have reviewed and discussed with patient certain preventive protocols, quality metrics, and best practice recommendations. A written personalized care plan for preventive services as well as general preventive health recommendations were provided to patient.     Tora Kindred, CMA   06/03/2023   After Visit Summary: (Mail) Due to this being a telephonic visit, the after visit summary with patients personalized plan was offered to patient via mail   Nurse Notes:  Patient shows aspirin allergy, but is taking 81 mg coated ASA daily Patient declined low dose CT  Scan Patient has agreed to Hep C screening with next labs Needs Tdap Needs Shringrix

## 2023-06-23 ENCOUNTER — Inpatient Hospital Stay (HOSPITAL_BASED_OUTPATIENT_CLINIC_OR_DEPARTMENT_OTHER): Admission: RE | Admit: 2023-06-23 | Payer: Medicare Other | Source: Ambulatory Visit

## 2023-06-24 ENCOUNTER — Telehealth (HOSPITAL_BASED_OUTPATIENT_CLINIC_OR_DEPARTMENT_OTHER): Payer: Self-pay

## 2023-07-02 ENCOUNTER — Ambulatory Visit: Payer: Self-pay

## 2023-07-02 NOTE — Patient Outreach (Signed)
  Care Coordination   Initial Visit Note   07/02/2023 Name: Jade [name] MRN: [id number] DOB: June 18, 1956  Jade [name] is a 67 y.o. year old female who sees Sandford Craze, NP for primary care. I spoke with  Jade [name] by phone today.  What matters to the patients health and wellness today?  Mrs. [name] reports she is interested in information on prediabetes. A1C 5.9 on 04/07/23.  Goals Addressed             This Visit's Progress    Care Coordination Activities       Interventions Today    Flowsheet Row Most Recent Value  Chronic Disease   Chronic disease during today's visit Other  [prediabetes]  General Interventions   General Interventions Discussed/Reviewed General Interventions Discussed, Doctor Visits  Doctor Visits Discussed/Reviewed Doctor Visits Discussed, PCP  Education Interventions   Education Provided Provided Web-based Education  [emmi video Prediabetes,  prediates and your body: overview,  lowering your risk of prediabetes and type 2 diabetes]  Nutrition Interventions   Nutrition Discussed/Reviewed Nutrition Discussed            SDOH assessments and interventions completed:  Yes reviewed with patent-recently completed. No changes.   Care Coordination Interventions:  Yes, provided   Follow up plan: Follow up call scheduled for 07/31/23    Encounter Outcome:  Pt. Visit Completed   Jade Sheriff, RN, MSN, BSN, CCM [facility] Care Coordinator [phone]

## 2023-07-02 NOTE — Patient Instructions (Signed)
Visit Information  Thank you for taking time to visit with me today. Please don't hesitate to contact me if I can be of assistance to you.   Following are the goals we discussed today:   Goals Addressed             This Visit's Progress    Care Coordination Activities       Interventions Today    Flowsheet Row Most Recent Value  Chronic Disease   Chronic disease during today's visit Other  [prediabetes]  General Interventions   General Interventions Discussed/Reviewed General Interventions Discussed, Doctor Visits  Doctor Visits Discussed/Reviewed Doctor Visits Discussed, PCP  Education Interventions   Education Provided Provided Web-based Education  [emmi video Prediabetes,  prediates and your body: overview,  lowering your risk of prediabetes and type 2 diabetes]  Nutrition Interventions   Nutrition Discussed/Reviewed Nutrition Discussed            Our next appointment is by telephone on 07/31/23 at 11:00 am  Please call the care guide team at [phone] if you need to cancel or reschedule your appointment.   If you are experiencing a Mental Health or Behavioral Health Crisis or need someone to talk to, please call the Suicide and Crisis Lifeline: 988 call the [place] National Suicide Prevention Lifeline: 276-503-2346 or TTY: (504) 356-9865 TTY 920-794-2722) to talk to a trained counselor call 1-800-273-TALK (toll free, 24 hour hotline)  Kathyrn Sheriff, RN, MSN, BSN, CCM [facility] Care Coordinator [phone]

## 2023-07-21 DIAGNOSIS — Z79891 Long term (current) use of opiate analgesic: Secondary | ICD-10-CM | POA: Diagnosis not present

## 2023-07-21 DIAGNOSIS — Z79899 Other long term (current) drug therapy: Secondary | ICD-10-CM | POA: Diagnosis not present

## 2023-07-21 DIAGNOSIS — M5416 Radiculopathy, lumbar region: Secondary | ICD-10-CM | POA: Diagnosis not present

## 2023-07-21 DIAGNOSIS — Z5181 Encounter for therapeutic drug level monitoring: Secondary | ICD-10-CM | POA: Diagnosis not present

## 2023-07-21 DIAGNOSIS — G894 Chronic pain syndrome: Secondary | ICD-10-CM | POA: Diagnosis not present

## 2023-07-31 ENCOUNTER — Telehealth: Payer: Self-pay

## 2023-07-31 NOTE — Patient Outreach (Signed)
  Care Coordination   07/31/2023 Name: Jade [name] MRN: [id number] DOB: 1956-10-08   Care Coordination Outreach Attempts:  An unsuccessful telephone outreach was attempted for a scheduled appointment today.  Follow Up Plan:  Additional outreach attempts will be made to offer the patient care coordination information and services.   Encounter Outcome:  No Answer   Care Coordination Interventions:  No, not indicated    Kathyrn Sheriff, RN, MSN, BSN, CCM Care Management Coordinator [phone]

## 2023-08-06 ENCOUNTER — Telehealth: Payer: Self-pay | Admitting: *Deleted

## 2023-08-06 NOTE — Progress Notes (Signed)
  Care Coordination Note  08/06/2023 Name: Jade [name] MRN: [id number] DOB: Dec 15, 1955  Jade [name] is a 67 y.o. year old female who is a primary care patient of Sandford Craze, NP and is actively engaged with the care management team. I reached out to [name] by phone today to assist with re-scheduling a follow up visit with the RN Case Manager  Follow up plan: Unsuccessful telephone outreach attempt made. A HIPAA compliant phone message was left for the patient providing contact information and requesting a return call.   Burman Nieves, CCMA Care Coordination Care Guide Direct Dial: [phone]

## 2023-08-11 NOTE — Progress Notes (Signed)
Care Coordination Note  08/11/2023 Name: Jade [name] MRN: [id number] DOB: 11-16-1956  Jade [name] is a 67 y.o. year old female who is a primary care patient of Sandford Craze, NP and is actively engaged with the care management team. I reached out to [name] by phone today to assist with re-scheduling a follow up visit with the RN Case Manager  Follow up plan: Telephone appointment with care management team member scheduled for: 08/29/2023  Burman Nieves, [facility] Care Coordination Care Guide Direct Dial: [phone]

## 2023-08-13 NOTE — Progress Notes (Unsigned)
Established patient visit   Patient: Jade [name]   DOB: 10-17-1956   67 y.o. Female  MRN: [id number] Visit Date: 08/14/2023  Today's healthcare provider: Alfredia Ferguson, PA-C   No chief complaint on file.  Subjective    HPI  ***  Medications: Outpatient Medications Prior to Visit  Medication Sig   aspirin EC 81 MG tablet Take 81 mg by mouth daily. Swallow whole.   carvedilol (COREG) 12.5 MG tablet Take 1 tablet (12.5 mg total) by mouth 2 (two) times daily with a meal.   fluconazole (DIFLUCAN) 150 MG tablet Take 1 tablet (150 mg total) by mouth daily. (Patient not taking: Reported on 06/03/2023)   gabapentin (NEURONTIN) 300 MG capsule TAKE 1 CAPSULE BY MOUTH DAILY IN THE MORNING, 1 AT NOON AND 2 AT BEDTIME.   hydrALAZINE (APRESOLINE) 50 MG tablet Take 50 mg by mouth 2 (two) times daily.   isosorbide mononitrate (IMDUR) 30 MG 24 hr tablet Take 1 tablet (30 mg total) by mouth daily.   linaclotide (LINZESS) 145 MCG CAPS capsule Take 1 capsule (145 mcg total) by mouth daily before breakfast. (Patient taking differently: Take 145 mcg by mouth daily as needed (constipation).)   losartan (COZAAR) 100 MG tablet TAKE 1 TABLET BY MOUTH EVERY DAY   methocarbamol (ROBAXIN) 500 MG tablet Take 1 tablet (500 mg total) by mouth every 6 (six) hours as needed for muscle spasms.   nitroGLYCERIN (NITROSTAT) 0.4 MG SL tablet Place 1 tablet (0.4 mg total) under the tongue every 5 (five) minutes as needed for chest pain (CP or SOB).   ondansetron (ZOFRAN) 4 MG tablet Take 1 tablet (4 mg total) by mouth every 8 (eight) hours as needed for nausea or vomiting. (Patient not taking: Reported on 06/03/2023)   oxyCODONE-acetaminophen (PERCOCET) 7.5-325 MG tablet Take 1 tablet by mouth every 4 (four) hours as needed for severe pain.   pantoprazole (PROTONIX) 40 MG tablet Take 1 tablet (40 mg total) by mouth daily.   potassium chloride SA (KLOR-CON M20) 20 MEQ tablet TAKE 1 TABLET BY MOUTH TWICE A DAY    rosuvastatin (CRESTOR) 20 MG tablet TAKE 1 TABLET BY MOUTH EVERY DAY   No facility-administered medications prior to visit.    Review of Systems {Insert previous labs (optional):23779} {See past labs  Heme  Chem  Endocrine  Serology  Results Review (optional):1}   Objective    LMP 11/25/1994  {Insert last BP/Wt (optional):23777}{See vitals history (optional):1}  Physical Exam  ***  No results found for any visits on 08/14/23.  Assessment & Plan     ***  No follow-ups on file.      {provider attestation***:1}   Alfredia Ferguson, PA-C  [facility] at [facility] [phone] (phone) [phone] (fax)  [facility]

## 2023-08-14 ENCOUNTER — Ambulatory Visit (HOSPITAL_BASED_OUTPATIENT_CLINIC_OR_DEPARTMENT_OTHER)
Admission: RE | Admit: 2023-08-14 | Discharge: 2023-08-14 | Disposition: A | Discharge status: Home / Self Care | Payer: Medicare Other | Source: Ambulatory Visit | Attending: Physician Assistant | Admitting: Physician Assistant

## 2023-08-14 ENCOUNTER — Encounter: Payer: Self-pay | Admitting: Physician Assistant

## 2023-08-14 ENCOUNTER — Ambulatory Visit (INDEPENDENT_AMBULATORY_CARE_PROVIDER_SITE_OTHER): Payer: Medicare Other | Admitting: Physician Assistant

## 2023-08-14 VITALS — BP 138/78 | HR 88 | Temp 98.1°F | Resp 18 | Wt 182.0 lb

## 2023-08-14 DIAGNOSIS — Z981 Arthrodesis status: Secondary | ICD-10-CM | POA: Diagnosis not present

## 2023-08-14 DIAGNOSIS — J209 Acute bronchitis, unspecified: Secondary | ICD-10-CM | POA: Diagnosis not present

## 2023-08-14 DIAGNOSIS — R069 Unspecified abnormalities of breathing: Secondary | ICD-10-CM | POA: Diagnosis not present

## 2023-08-14 DIAGNOSIS — K5900 Constipation, unspecified: Secondary | ICD-10-CM | POA: Diagnosis not present

## 2023-08-14 DIAGNOSIS — R059 Cough, unspecified: Secondary | ICD-10-CM | POA: Diagnosis not present

## 2023-08-14 DIAGNOSIS — J0110 Acute frontal sinusitis, unspecified: Secondary | ICD-10-CM

## 2023-08-14 MED ORDER — PREDNISONE 20 MG PO TABS
20.0000 mg | ORAL_TABLET | Freq: Every day | ORAL | 0 refills | Status: DC
Start: 2023-08-14 — End: 2023-10-31

## 2023-08-14 MED ORDER — LINACLOTIDE 145 MCG PO CAPS
145.0000 ug | ORAL_CAPSULE | Freq: Every day | ORAL | 1 refills | Status: AC | PRN
Start: 2023-08-14 — End: ?

## 2023-08-14 MED ORDER — AMOXICILLIN 875 MG PO TABS
875.0000 mg | ORAL_TABLET | Freq: Two times a day (BID) | ORAL | 0 refills | Status: AC
Start: 2023-08-14 — End: 2023-08-21

## 2023-08-26 ENCOUNTER — Other Ambulatory Visit: Payer: Self-pay | Admitting: Family

## 2023-08-26 NOTE — Telephone Encounter (Signed)
Please contact pt to schedule a follow up visit.  

## 2023-08-27 NOTE — Telephone Encounter (Signed)
Pt scheduled 11/4

## 2023-08-29 ENCOUNTER — Telehealth: Payer: Self-pay

## 2023-08-29 NOTE — Patient Outreach (Signed)
Care Coordination   08/29/2023 Name: JORDIN SINON MRN: [id number] DOB: 1955/12/24   Care Coordination Outreach Attempts:  An unsuccessful telephone outreach was attempted for a scheduled appointment today.  Follow Up Plan:  Additional outreach attempts will be made to offer the patient care coordination information and services.   Encounter Outcome:  No Answer   Care Coordination Interventions:  No, not indicated    Kathyrn Sheriff, RN, MSN, BSN, CCM Care Management Coordinator [phone]

## 2023-09-08 ENCOUNTER — Telehealth: Payer: Self-pay | Admitting: *Deleted

## 2023-09-08 NOTE — Progress Notes (Signed)
Care Coordination Note  09/08/2023 Name: EVALY GINGER MRN: [id number] DOB: [birth year]  FLORENCIA EBSEN is a 67 y.o. year old female who is a primary care patient of Sandford Craze, NP and is actively engaged with the care management team. I reached out to [name] by phone today to assist with re-scheduling a follow up visit with the RN Case Manager  Follow up plan: Unsuccessful telephone outreach attempt made. A HIPAA compliant phone message was left for the patient providing contact information and requesting a return call.   Burman Nieves, CCMA Care Coordination Care Guide Direct Dial: [phone]

## 2023-09-12 NOTE — Progress Notes (Signed)
Care Coordination Note  09/12/2023 Name: CAYLA SPRANDEL MRN: [id number] DOB: [birth year]  SAMANATHA KERNAGHAN is a 67 y.o. year old female who is a primary care patient of Sandford Craze, NP and is actively engaged with the care management team. I reached out to [name] by phone today to assist with re-scheduling a follow up visit with the RN Case Manager  Follow up plan: We have been unable to make contact with the patient for follow up.   Burman Nieves, CCMA Care Coordination Care Guide Direct Dial: [phone]

## 2023-09-24 ENCOUNTER — Ambulatory Visit: Payer: Self-pay

## 2023-09-24 ENCOUNTER — Other Ambulatory Visit: Payer: Self-pay | Admitting: Family

## 2023-09-24 NOTE — Patient Outreach (Signed)
Care Coordination   Case closure  Visit Note   09/24/2023 Name: Jade [name] MRN: [id number] DOB: 06/04/1956  Jade [name] is a 67 y.o. year old female who sees Sandford Craze, NP for primary care.   RNCM received message from care guide regarding unsuccessful outreach attempts: "We have been unable to make contact with the patient for follow up". Case Closed    Goals Addressed             This Visit's Progress    COMPLETED: Care Coordination Activities       Interventions Today    Flowsheet Row Most Recent Value  General Interventions   General Interventions Discussed/Reviewed General Interventions Reviewed  [We have been unable to make contact with the patient for follow up. case closed]            SDOH assessments and interventions completed:  No  Care Coordination Interventions:  No, not indicated   Follow up plan: No further intervention required.   Encounter Outcome:  Patient Visit Completed   Kathyrn Sheriff, RN, MSN, BSN, CCM Care Management Coordinator [phone]

## 2023-09-29 ENCOUNTER — Ambulatory Visit: Payer: Medicare Other | Admitting: Family

## 2023-09-29 DIAGNOSIS — Z96641 Presence of right artificial hip joint: Secondary | ICD-10-CM | POA: Diagnosis not present

## 2023-09-30 ENCOUNTER — Other Ambulatory Visit (HOSPITAL_BASED_OUTPATIENT_CLINIC_OR_DEPARTMENT_OTHER): Payer: Self-pay

## 2023-09-30 ENCOUNTER — Encounter (HOSPITAL_BASED_OUTPATIENT_CLINIC_OR_DEPARTMENT_OTHER): Payer: Self-pay | Admitting: Urology

## 2023-09-30 ENCOUNTER — Emergency Department (HOSPITAL_BASED_OUTPATIENT_CLINIC_OR_DEPARTMENT_OTHER)
Admission: EM | Admit: 2023-09-30 | Discharge: 2023-09-30 | Disposition: A | Discharge status: Home / Self Care | Payer: Medicare Other | Attending: Emergency Medicine | Admitting: Emergency Medicine

## 2023-09-30 ENCOUNTER — Ambulatory Visit (INDEPENDENT_AMBULATORY_CARE_PROVIDER_SITE_OTHER): Payer: Medicare Other | Admitting: Family

## 2023-09-30 ENCOUNTER — Emergency Department (HOSPITAL_BASED_OUTPATIENT_CLINIC_OR_DEPARTMENT_OTHER): Payer: Medicare Other

## 2023-09-30 VITALS — BP 139/82 | HR 78 | Temp 98.7°F | Resp 16 | Ht 65.0 in | Wt 176.0 lb

## 2023-09-30 DIAGNOSIS — I2510 Atherosclerotic heart disease of native coronary artery without angina pectoris: Secondary | ICD-10-CM | POA: Diagnosis not present

## 2023-09-30 DIAGNOSIS — K589 Irritable bowel syndrome without diarrhea: Secondary | ICD-10-CM

## 2023-09-30 DIAGNOSIS — R079 Chest pain, unspecified: Secondary | ICD-10-CM | POA: Diagnosis not present

## 2023-09-30 DIAGNOSIS — R42 Dizziness and giddiness: Secondary | ICD-10-CM | POA: Insufficient documentation

## 2023-09-30 DIAGNOSIS — Z7982 Long term (current) use of aspirin: Secondary | ICD-10-CM | POA: Insufficient documentation

## 2023-09-30 DIAGNOSIS — R112 Nausea with vomiting, unspecified: Secondary | ICD-10-CM | POA: Diagnosis not present

## 2023-09-30 DIAGNOSIS — I771 Stricture of artery: Secondary | ICD-10-CM | POA: Diagnosis not present

## 2023-09-30 DIAGNOSIS — R531 Weakness: Secondary | ICD-10-CM | POA: Insufficient documentation

## 2023-09-30 DIAGNOSIS — R1013 Epigastric pain: Secondary | ICD-10-CM

## 2023-09-30 LAB — CBC WITH DIFFERENTIAL/PLATELET
Abs Immature Granulocytes: 0.02 10*3/uL (ref 0.00–0.07)
Basophils Absolute: 0 10*3/uL (ref 0.0–0.1)
Basophils Relative: 0 %
Eosinophils Absolute: 0.1 10*3/uL (ref 0.0–0.5)
Eosinophils Relative: 1 %
HCT: 38 % (ref 36.0–46.0)
Hemoglobin: 12.1 g/dL (ref 12.0–15.0)
Immature Granulocytes: 0 %
Lymphocytes Relative: 30 %
Lymphs Abs: 2.9 10*3/uL (ref 0.7–4.0)
MCH: 25.7 pg — ABNORMAL LOW (ref 26.0–34.0)
MCHC: 31.8 g/dL (ref 30.0–36.0)
MCV: 80.7 fL (ref 80.0–100.0)
Monocytes Absolute: 0.8 10*3/uL (ref 0.1–1.0)
Monocytes Relative: 8 %
Neutro Abs: 6 10*3/uL (ref 1.7–7.7)
Neutrophils Relative %: 61 %
Platelets: 352 10*3/uL (ref 150–400)
RBC: 4.71 MIL/uL (ref 3.87–5.11)
RDW: 15.9 % — ABNORMAL HIGH (ref 11.5–15.5)
WBC: 9.9 10*3/uL (ref 4.0–10.5)
nRBC: 0 % (ref 0.0–0.2)

## 2023-09-30 LAB — COMPREHENSIVE METABOLIC PANEL
ALT: 8 U/L (ref 0–44)
AST: 14 U/L — ABNORMAL LOW (ref 15–41)
Albumin: 4.2 g/dL (ref 3.5–5.0)
Alkaline Phosphatase: 88 U/L (ref 38–126)
Anion gap: 10 (ref 5–15)
BUN: 11 mg/dL (ref 8–23)
CO2: 23 mmol/L (ref 22–32)
Calcium: 9.6 mg/dL (ref 8.9–10.3)
Chloride: 105 mmol/L (ref 98–111)
Creatinine, Ser: 1.06 mg/dL — ABNORMAL HIGH (ref 0.44–1.00)
GFR, Estimated: 57 mL/min — ABNORMAL LOW (ref >60)
Glucose, Bld: 97 mg/dL (ref 70–99)
Potassium: 3.8 mmol/L (ref 3.5–5.1)
Sodium: 138 mmol/L (ref 135–145)
Total Bilirubin: 0.4 mg/dL (ref <1.2)
Total Protein: 6.8 g/dL (ref 6.5–8.1)

## 2023-09-30 LAB — LIPASE, BLOOD: Lipase: <10 U/L — ABNORMAL LOW (ref 11–51)

## 2023-09-30 LAB — TROPONIN I (HIGH SENSITIVITY)
Troponin I (High Sensitivity): 25 ng/L — ABNORMAL HIGH (ref <18)
Troponin I (High Sensitivity): 33 ng/L — ABNORMAL HIGH (ref <18)

## 2023-09-30 MED ORDER — POTASSIUM CHLORIDE CRYS ER 20 MEQ PO TBCR: EXTENDED_RELEASE_TABLET | ORAL | 1 refills | Status: DC

## 2023-09-30 MED ORDER — SUCRALFATE 1 G PO TABS
1.0000 g | ORAL_TABLET | Freq: Three times a day (TID) | ORAL | 0 refills | Status: AC
  Filled 2023-09-30: qty 56, 14d supply, fill #0

## 2023-09-30 MED ORDER — GABAPENTIN 300 MG PO CAPS: ORAL_CAPSULE | ORAL | 1 refills | Status: DC

## 2023-09-30 NOTE — Discharge Instructions (Addendum)
Follow-up with gastroenterology to follow-up with cardiology.  Follow-up with primary care doctor.  Take Carafate as prescribed with meals.  Return if symptoms worsen.

## 2023-09-30 NOTE — Assessment & Plan Note (Signed)
  Reports of using Linzess intermittently for relief. -Continue Linzess as needed.

## 2023-09-30 NOTE — ED Provider Notes (Signed)
[place] [facility]PARTMENT AT [facility] Provider Note   CSN: [id number] Arrival date & time: 09/30/23  1028     History  Chief Complaint  Patient presents with   Dizziness   Abdominal Pain    CEAIRA [name] is a 67 y.o. female.  Patient here with some generalized weakness, intermittent nausea the last few weeks.  Seems worse with eating.  Some weakness today maybe yesterday.  Denies any chest pain.  Was sent here by clinic due to a cardiac history that she has.  EKG may be with some changes.  She is not having any chest pain or shortness of breath.  No symptoms with exertion.  She will have some abdominal discomfort intermittently mostly in the epigastric.  Seems to happen after eating.  She is not having any major symptoms now.  Took felt a little bit generally weak today.  She denies any black or bloody stools.  Denies any fever or chills.  Denies any abdominal pain shortness of breath recent surgery or travel.  The history is provided by the patient.       Home Medications Prior to Admission medications   Medication Sig Start Date End Date Taking? Authorizing Provider  sucralfate (CARAFATE) 1 g tablet Take 1 tablet (1 g total) by mouth 4 (four) times daily -  with meals and at bedtime for 14 days. 09/30/23 10/14/23 Yes Silveria Botz, DO  aspirin EC 81 MG tablet Take 81 mg by mouth daily. Swallow whole.    [provider]  carvedilol (COREG) 12.5 MG tablet Take 1 tablet (12.5 mg total) by mouth 2 (two) times daily with a meal. 11/15/19   Tobb, Kardie, DO  fluconazole (DIFLUCAN) 150 MG tablet Take 1 tablet (150 mg total) by mouth daily. 04/18/23   Hill, Alain Honey, PA-C  gabapentin (NEURONTIN) 300 MG capsule TAKE 1 CAPSULE BY MOUTH DAILY IN THE MORNING, 1 AT NOON AND 2 AT BEDTIME. 09/30/23   Sandford Craze, NP  hydrALAZINE (APRESOLINE) 50 MG tablet Take 50 mg by mouth 2 (two) times daily. 01/03/23   [provider]  isosorbide mononitrate (IMDUR)  30 MG 24 hr tablet Take 1 tablet (30 mg total) by mouth daily. 11/15/19   Tobb, Kardie, DO  linaclotide (LINZESS) 145 MCG CAPS capsule Take 1 capsule (145 mcg total) by mouth daily as needed (constipation). 08/14/23   Alfredia Ferguson, PA-C  losartan (COZAAR) 100 MG tablet TAKE 1 TABLET BY MOUTH EVERY DAY 08/26/23   Sandford Craze, NP  methocarbamol (ROBAXIN) 500 MG tablet Take 1 tablet (500 mg total) by mouth every 6 (six) hours as needed for muscle spasms. 04/18/23   Hill, Alain Honey, PA-C  nitroGLYCERIN (NITROSTAT) 0.4 MG SL tablet Place 1 tablet (0.4 mg total) under the tongue every 5 (five) minutes as needed for chest pain (CP or SOB). 11/15/19   Tobb, Kardie, DO  ondansetron (ZOFRAN) 4 MG tablet Take 1 tablet (4 mg total) by mouth every 8 (eight) hours as needed for nausea or vomiting. 04/18/23 04/17/24  Clois Dupes, PA-C  oxyCODONE-acetaminophen (PERCOCET) 7.5-325 MG tablet Take 1 tablet by mouth every 4 (four) hours as needed for severe pain. 04/18/23 04/17/24  Clois Dupes, PA-C  pantoprazole (PROTONIX) 40 MG tablet TAKE 1 TABLET BY MOUTH TWICE A DAY 09/24/23   Sandford Craze, NP  potassium chloride SA (KLOR-CON M20) 20 MEQ tablet TAKE 1 TABLET BY MOUTH TWICE A DAY 09/30/23   Sandford Craze, NP  predniSONE (DELTASONE)  20 MG tablet Take 1 tablet (20 mg total) by mouth daily with breakfast. 08/14/23   Ok Edwards, Lillia Abed, PA-C  rosuvastatin (CRESTOR) 20 MG tablet TAKE 1 TABLET BY MOUTH EVERY DAY 08/26/23   Sandford Craze, NP      Allergies    Shrimp [shellfish allergy], Ace inhibitors, Aspirin, and Azithromycin    Review of Systems   Review of Systems  Physical Exam Updated Vital Signs BP (!) 161/83   Pulse 76   Temp 98.3 F (36.8 C)   Resp 18   Ht 5\' 5"  (1.651 m)   Wt 79 kg   LMP 11/25/1994   SpO2 100%   BMI 28.98 kg/m  Physical Exam Vitals and nursing note reviewed.  Constitutional:      General: She is not in acute distress.    Appearance: She is well-developed.   HENT:     Head: Normocephalic and atraumatic.     Mouth/Throat:     Mouth: Mucous membranes are moist.  Eyes:     Extraocular Movements: Extraocular movements intact.     Conjunctiva/sclera: Conjunctivae normal.     Pupils: Pupils are equal, round, and reactive to light.  Cardiovascular:     Rate and Rhythm: Normal rate and regular rhythm.     Heart sounds: Normal heart sounds. No murmur heard. Pulmonary:     Effort: Pulmonary effort is normal. No respiratory distress.     Breath sounds: Normal breath sounds.  Abdominal:     Palpations: Abdomen is soft.     Tenderness: There is no abdominal tenderness.  Genitourinary:    Rectum: Normal.  Musculoskeletal:        General: No swelling.     Cervical back: Neck supple.  Skin:    General: Skin is warm and dry.     Capillary Refill: Capillary refill takes less than 2 seconds.  Neurological:     General: No focal deficit present.     Mental Status: She is alert and oriented to person, place, and time.     Cranial Nerves: No cranial nerve deficit.     Motor: No weakness.  Psychiatric:        Mood and Affect: Mood normal.     ED Results / Procedures / Treatments   Labs (all labs ordered are listed, but only abnormal results are displayed) Labs Reviewed  CBC WITH DIFFERENTIAL/PLATELET - Abnormal; Notable for the following components:      Result Value   MCH 25.7 (*)    RDW 15.9 (*)    All other components within normal limits  COMPREHENSIVE METABOLIC PANEL - Abnormal; Notable for the following components:   Creatinine, Ser 1.06 (*)    AST 14 (*)    GFR, Estimated 57 (*)    All other components within normal limits  LIPASE, BLOOD - Abnormal; Notable for the following components:   Lipase <10 (*)    All other components within normal limits  TROPONIN I (HIGH SENSITIVITY) - Abnormal; Notable for the following components:   Troponin I (High Sensitivity) 25 (*)    All other components within normal limits  TROPONIN I (HIGH  SENSITIVITY) - Abnormal; Notable for the following components:   Troponin I (High Sensitivity) 33 (*)    All other components within normal limits    EKG EKG Interpretation Date/Time:  Tuesday September 30 2023 10:52:15 EST Ventricular Rate:  69 PR Interval:  204 QRS Duration:  93 QT Interval:  426 QTC Calculation: 457 R Axis:   -  50  Text Interpretation: Sinus rhythm Biatrial enlargement Left anterior fascicular block Left ventricular hypertrophy Confirmed by Virgina Norfolk [phone]) on 09/30/2023 1:08:41 PM  Radiology DG Chest Portable 1 View  Result Date: 09/30/2023 CLINICAL DATA:  Chest pain. EXAM: PORTABLE CHEST 1 VIEW COMPARISON:  08/14/2023 FINDINGS: Poor inspiration. Stable borderline enlarged cardiac silhouette. Tortuous and partially calcified thoracic aorta. Clear lungs with normal vascularity. Cervical spine fixation hardware. IMPRESSION: 1. Poor inspiration. 2. No acute abnormality. Electronically Signed   By: Beckie Salts M.D.   On: 09/30/2023 13:49    Procedures Procedures    Medications Ordered in ED Medications - No data to display  ED Course/ Medical Decision Making/ A&P                                 Medical Decision Making Amount and/or Complexity of Data Reviewed Labs: ordered. Radiology: ordered.  Risk Prescription drug management.   [name] here with some generalized weakness, abdominal discomfort.  Abdominal discomfort on and off for the last few weeks seems to be associated with eating.  Does not have any pain now.  She felt some generalized weakness today.  Had an EKG done in her primary care clinic today and was sent here for evaluation.  EKG here shows sinus rhythm.  She has T wave inversions throughout but appears similar to prior EKGs.  She has had prior CAD.  Not having any chest pain.  History of high blood pressure.  Neurologically she is intact.  Differential diagnosis likely gastritis/GI process but will evaluate for ACS, pancreatitis  infectious process, bladder issues.  Overall we will get CBC CMP lipase troponin chest x-ray.  Per my review and interpretation labs troponin 25 and 33.  Lab work otherwise in no significant anemia,, kidney injury or leukocytosis or electrolyte abnormality..  Troponin leak seems like likely chronic.  Atypical story for ACS.  I had Dr. Jens Som with cardiology review EKG and overall patient's not having any discomfort now.  Will have her follow-up with cardiology.  I do think that this is likely stomach related and GI related.  Will have her follow-up with GI.  She understands return precautions.  Discharged in good condition.  This chart was dictated using voice recognition software.  Despite best efforts to proofread,  errors can occur which can change the documentation meaning.         Final Clinical Impression(s) / ED Diagnoses Final diagnoses:  Epigastric pain    Rx / DC Orders ED Discharge Orders          Ordered    sucralfate (CARAFATE) 1 g tablet  3 times daily with meals & bedtime        09/30/23 1442              Virgina Norfolk, DO 09/30/23 1444

## 2023-09-30 NOTE — ED Triage Notes (Signed)
Pt sates went to pcp for Nausea x 3-4 weeks and states started having light headedness that started today  Was sent to ER due to EKG changes  Denies any CP at this time

## 2023-09-30 NOTE — Assessment & Plan Note (Addendum)
Given her non-specific complaints and abnormal EKG, I have advised her to go to the ER downstairs for further evaluation.   EKG tracing is personally reviewed.  EKG notes NSR.  Note is made of new TWI in lead I, and increased ST elevation in v1/v2/II.  Report was given to Dr. Lockie Mola ED physician on duty at the [facility] pharmacy.

## 2023-09-30 NOTE — Assessment & Plan Note (Deleted)
Given her non-specific complaints and abnormal EKG, I have advised her to go to the ER downstairs for further evaluation.   EKG tracing is personally reviewed.  EKG notes NSR.  Note is made of new TWI in lead I, and increased ST elevation in v1/v2/II.

## 2023-09-30 NOTE — Progress Notes (Signed)
Subjective:     Patient ID: Jade [name], female    DOB: [birth year], 67 y.o.   MRN: [id number]  Chief Complaint  Patient presents with   Emesis    Patient complains of vomiting on and off for about 3 weeks.    Dizziness    Complains of dizziness that started today    HPI  Discussed the use of AI scribe software for clinical note transcription with the patient, who gave verbal consent to proceed.  History of Present Illness   The patient presents with a feeling of 'lightheadedness,' sweating, and weakness. She reports a sensation of food 'stopping' in her upper abdomen after eating, followed by vomiting. This has been ongoing prior to the current illness. She also describes a sensation of her abdomen becoming 'hard like I'm pregnant' after eating. She has a history of sinus issues, with a recent episode of nosebleeds during a sinus infection. She is currently on blood thinners and aspirin. She also reports a history of a heart attack and expresses concern about her current symptoms being related to her heart. She has been taking Linzess for bowel movements, which she reports is helping, but she does not take it daily. She denies chest pain but does report DOE.       Health Maintenance Due  Topic Date Due   Zoster Vaccines- Shingrix (1 of 2) 08/06/1973   Lung Cancer Screening  Never done   DTaP/Tdap/Td (3 - Td or Tdap) 07/14/2021   MAMMOGRAM  05/28/2023   INFLUENZA VACCINE  06/26/2023   COVID-19 Vaccine (4 - 2023-24 season) 07/27/2023   Diabetic kidney evaluation - Urine ACR  09/03/2023   DEXA SCAN  09/18/2023   FOOT EXAM  09/03/2023   OPHTHALMOLOGY EXAM  09/04/2023    Past Medical History:  Diagnosis Date   Abnormal EKG 07/16/2011   ACS (acute coronary syndrome) (HCC) 10/22/2019   Anemia    Benign microscopic hematuria    neg cystoscopy 11/12- dr. Brunilda Payor   Coronary artery disease    DJD (degenerative joint disease)    L KNEE   Family history of anesthesia  complication    multiple family members with history of this   GERD (gastroesophageal reflux disease)    Hurthle cell neoplasm of thyroid    Hyperlipidemia LDL goal <70 10/23/2019   Hypertension    Hypertensive retinopathy    Malignant hyperthermia    NSTEMI (non-ST elevated myocardial infarction) (HCC) 10/22/2019   Numbness and tingling in left arm     Past Surgical History:  Procedure Laterality Date   ABDOMINAL HYSTERECTOMY  1996   BREAST EXCISIONAL BIOPSY Left 01/2016   BREAST EXCISIONAL BIOPSY Left 03/2016   CARDIAC CATHETERIZATION     CERVICAL DISC SURGERY  2013   Dr. Lovell Sheehan   COLONOSCOPY  2010   in [place], [place]-normal exam   CORONARY BALLOON ANGIOPLASTY N/A 10/25/2019   Procedure: CORONARY BALLOON ANGIOPLASTY;  Surgeon: Lennette Bihari, MD;  Location: [facility];  Service: Cardiovascular;  Laterality: N/A;   CORONARY STENT INTERVENTION N/A 10/22/2019   Procedure: CORONARY STENT INTERVENTION;  Surgeon: Lennette Bihari, MD;  Location: [facility];  Service: Cardiovascular;  Laterality: N/A;  mid lad    CORONARY STENT INTERVENTION N/A 10/25/2019   Procedure: CORONARY STENT INTERVENTION;  Surgeon: Lennette Bihari, MD;  Location: [facility];  Service: Cardiovascular;  Laterality: N/A;   DILATION AND CURETTAGE OF UTERUS     KNEE CARTILAGE  SURGERY  1999   right knee   LEFT HEART CATH AND CORONARY ANGIOGRAPHY N/A 10/22/2019   Procedure: LEFT HEART CATH AND CORONARY ANGIOGRAPHY;  Surgeon: Lennette Bihari, MD;  Location: [facility];  Service: Cardiovascular;  Laterality: N/A;   ROTATOR CUFF REPAIR Right 05/25/2013   TOTAL HIP ARTHROPLASTY Right 04/17/2023   Procedure: TOTAL HIP ARTHROPLASTY ANTERIOR APPROACH;  Surgeon: Samson Frederic, MD;  Location: [facility];  Service: Orthopedics;  Laterality: Right;   TOTAL KNEE ARTHROPLASTY Left 01/20/2014   Procedure: LEFT TOTAL KNEE ARTHROPLASTY;  Surgeon: Javier Docker, MD;  Location: [facility];  Service:  Orthopedics;  Laterality: Left;   TOTAL KNEE ARTHROPLASTY Right 01/19/2015   Procedure: RIGHT TOTAL KNEE ARTHROPLASTY;  Surgeon: Javier Docker, MD;  Location: [facility];  Service: Orthopedics;  Laterality: Right;   TUBAL LIGATION      Family History  Problem Relation Age of Onset   Hypertension Mother    Hyperlipidemia Mother    Heart disease Father        CAD; stent at age 83   Diabetes Father    Hypertension Father    Hyperlipidemia Father    Cancer Father        prostate   Dementia Father    Osteoarthritis Sister    Hypertension Sister    COPD Sister    CAD Sister        s/p stent   Edema Sister        cause unknown   Osteoporosis Sister        bad hips, bed bound, has one leg amputated   Drug abuse Sister    Dementia Paternal Grandmother    Cancer Paternal Aunt        BREAST   Breast cancer Paternal Aunt    Colon cancer Neg Hx     Social History   Socioeconomic History   Marital status: Married    Spouse name: Chrissie Noa   Number of children: 3   Years of education: Not on file   Highest education level: Not on file  Occupational History    Comment: works part-time at [employer]  Tobacco Use   Smoking status: Former    Current packs/day: 0.00    Average packs/day: 0.5 packs/day for 45.0 years (22.5 ttl pk-yrs)    Types: Cigarettes    Start date: 01/23/1978    Quit date: 01/24/2023    Years since quitting: 0.6   Smokeless tobacco: Never  Vaping Use   Vaping status: Never Used  Substance and Sexual Activity   Alcohol use: No    Alcohol/week: 0.0 standard drinks of alcohol   Drug use: No   Sexual activity: Yes    Partners: Male  Other Topics Concern   Not on file  Social History Narrative   Regular exercise:  Patient was walking at work 4 times per week until hip replacement surgery 03/2023   Caffeine Use:  2 glasses of soda daily.   Married, 3 children- grown   Completed 12th grade.   Social Determinants of Health   Financial Resource Strain: Low Risk   (06/03/2023)   Overall Financial Resource Strain (CARDIA)    Difficulty of Paying Living Expenses: Not hard at all  Food Insecurity: No Food Insecurity (06/03/2023)   Hunger Vital Sign    Worried About Running Out of Food in the Last Year: Never true    Ran Out of Food in the Last Year: Never true  Transportation Needs:  No Transportation Needs (06/03/2023)   PRAPARE - Administrator, Civil Service (Medical): No    Lack of Transportation (Non-Medical): No  Physical Activity: Insufficiently Active (06/03/2023)   Exercise Vital Sign    Days of Exercise per Week: 4 days    Minutes of Exercise per Session: 10 min  Stress: No Stress Concern Present (06/03/2023)   [employer] of Occupational Health - Occupational Stress Questionnaire    Feeling of Stress : Not at all  Social Connections: Socially Integrated (06/03/2023)   Social Connection and Isolation Panel [NHANES]    Frequency of Communication with Friends and Family: More than three times a week    Frequency of Social Gatherings with Friends and Family: Once a week    Attends Religious Services: More than 4 times per year    Active Member of [employer] or Organizations: Yes    Attends Engineer, structural: More than 4 times per year    Marital Status: Married  Catering manager Violence: Not At Risk (06/03/2023)   Humiliation, Afraid, Rape, and Kick questionnaire    Fear of Current or Ex-Partner: No    Emotionally Abused: No    Physically Abused: No    Sexually Abused: No    Outpatient Medications Prior to Visit  Medication Sig Dispense Refill   aspirin EC 81 MG tablet Take 81 mg by mouth daily. Swallow whole.     carvedilol (COREG) 12.5 MG tablet Take 1 tablet (12.5 mg total) by mouth 2 (two) times daily with a meal. 180 tablet 1   fluconazole (DIFLUCAN) 150 MG tablet Take 1 tablet (150 mg total) by mouth daily. 1 tablet 1   hydrALAZINE (APRESOLINE) 50 MG tablet Take 50 mg by mouth 2 (two) times daily.     isosorbide  mononitrate (IMDUR) 30 MG 24 hr tablet Take 1 tablet (30 mg total) by mouth daily. 90 tablet 1   linaclotide (LINZESS) 145 MCG CAPS capsule Take 1 capsule (145 mcg total) by mouth daily as needed (constipation). 30 capsule 1   losartan (COZAAR) 100 MG tablet TAKE 1 TABLET BY MOUTH EVERY DAY 30 tablet 0   methocarbamol (ROBAXIN) 500 MG tablet Take 1 tablet (500 mg total) by mouth every 6 (six) hours as needed for muscle spasms. 20 tablet 0   nitroGLYCERIN (NITROSTAT) 0.4 MG SL tablet Place 1 tablet (0.4 mg total) under the tongue every 5 (five) minutes as needed for chest pain (CP or SOB). 25 tablet 3   ondansetron (ZOFRAN) 4 MG tablet Take 1 tablet (4 mg total) by mouth every 8 (eight) hours as needed for nausea or vomiting. 30 tablet 0   oxyCODONE-acetaminophen (PERCOCET) 7.5-325 MG tablet Take 1 tablet by mouth every 4 (four) hours as needed for severe pain. 42 tablet 0   pantoprazole (PROTONIX) 40 MG tablet TAKE 1 TABLET BY MOUTH TWICE A DAY 180 tablet 1   predniSONE (DELTASONE) 20 MG tablet Take 1 tablet (20 mg total) by mouth daily with breakfast. 5 tablet 0   rosuvastatin (CRESTOR) 20 MG tablet TAKE 1 TABLET BY MOUTH EVERY DAY 30 tablet 0   gabapentin (NEURONTIN) 300 MG capsule TAKE 1 CAPSULE BY MOUTH DAILY IN THE MORNING, 1 AT NOON AND 2 AT BEDTIME. 360 capsule 0   potassium chloride SA (KLOR-CON M20) 20 MEQ tablet TAKE 1 TABLET BY MOUTH TWICE A DAY 180 tablet 0   No facility-administered medications prior to visit.    Allergies  Allergen Reactions   Shrimp [  Shellfish Allergy] Itching    Rash and lips itching   Ace Inhibitors Cough    Cough   Aspirin Nausea And Vomiting    Ok with taking coated 81mg    Azithromycin Rash    ROS See HPI    Objective:    Physical Exam Constitutional:      General: She is not in acute distress.    Appearance: Normal appearance. She is well-developed.  HENT:     Head: Normocephalic and atraumatic.     Right Ear: External ear normal.     Left  Ear: External ear normal.  Eyes:     General: No scleral icterus. Neck:     Thyroid: No thyromegaly.  Cardiovascular:     Rate and Rhythm: Normal rate and regular rhythm.     Heart sounds: Normal heart sounds. No murmur heard. Pulmonary:     Effort: Pulmonary effort is normal. No respiratory distress.     Breath sounds: Normal breath sounds. No wheezing.  Musculoskeletal:     Cervical back: Neck supple.  Skin:    General: Skin is warm and dry.  Neurological:     Mental Status: She is alert and oriented to person, place, and time.  Psychiatric:        Mood and Affect: Mood normal.        Behavior: Behavior normal.        Thought Content: Thought content normal.        Judgment: Judgment normal.      BP 139/82 (BP Location: Right Arm, Patient Position: Sitting, Cuff Size: Normal)   Pulse 78   Temp 98.7 F (37.1 C) (Oral)   Resp 16   Ht 5\' 5"  (1.651 m)   Wt 176 lb (79.8 kg)   LMP 11/25/1994   SpO2 100%   BMI 29.29 kg/m  Wt Readings from Last 3 Encounters:  09/30/23 174 lb 2.6 oz (79 kg)  09/30/23 176 lb (79.8 kg)  08/14/23 182 lb (82.6 kg)       Assessment & Plan:   Problem List Items Addressed This Visit       Unprioritized   IBS (irritable bowel syndrome)     Reports of using Linzess intermittently for relief. -Continue Linzess as needed.      Coronary artery disease involving native heart without angina pectoris    Given her non-specific complaints and abnormal EKG, I have advised her to go to the ER downstairs for further evaluation.   EKG tracing is personally reviewed.  EKG notes NSR.  Note is made of new TWI in lead I, and increased ST elevation in v1/v2/II.  Report was given to Dr. Lockie Mola ED physician on duty at the [facility] pharmacy.       Relevant Orders   EKG 12-Lead (Completed)   Other Visit Diagnoses     Epigastric pain    -  Primary   Relevant Orders   US Abdomen Limited RUQ (LIVER/GB)       I am having Jade [name]  maintain her carvedilol, isosorbide mononitrate, nitroGLYCERIN, hydrALAZINE, ondansetron, oxyCODONE-acetaminophen, methocarbamol, fluconazole, aspirin EC, predniSONE, linaclotide, rosuvastatin, losartan, pantoprazole, gabapentin, and potassium chloride SA.  Meds ordered this encounter  Medications   gabapentin (NEURONTIN) 300 MG capsule    Sig: TAKE 1 CAPSULE BY MOUTH DAILY IN THE MORNING, 1 AT NOON AND 2 AT BEDTIME.    Dispense:  360 capsule    Refill:  1    Order Specific Question:  Supervising Provider    Answer:   Danise Edge A [[id number]]   potassium chloride SA (KLOR-CON M20) 20 MEQ tablet    Sig: TAKE 1 TABLET BY MOUTH TWICE A DAY    Dispense:  180 tablet    Refill:  1    Order Specific Question:   Supervising Provider    Answer:   Danise Edge A [[id number]]

## 2023-09-30 NOTE — Patient Instructions (Signed)
Please go to the ER on the first floor now for further evaluation of your heart.

## 2023-10-06 ENCOUNTER — Encounter (HOSPITAL_BASED_OUTPATIENT_CLINIC_OR_DEPARTMENT_OTHER): Payer: Self-pay

## 2023-10-06 ENCOUNTER — Ambulatory Visit (HOSPITAL_BASED_OUTPATIENT_CLINIC_OR_DEPARTMENT_OTHER)
Admission: RE | Admit: 2023-10-06 | Discharge: 2023-10-06 | Disposition: A | Discharge status: Home / Self Care | Payer: Medicare Other | Source: Ambulatory Visit | Attending: Family | Admitting: Family

## 2023-10-06 DIAGNOSIS — Z1231 Encounter for screening mammogram for malignant neoplasm of breast: Secondary | ICD-10-CM

## 2023-10-06 DIAGNOSIS — R1013 Epigastric pain: Secondary | ICD-10-CM | POA: Diagnosis not present

## 2023-10-06 DIAGNOSIS — R1011 Right upper quadrant pain: Secondary | ICD-10-CM | POA: Diagnosis not present

## 2023-10-24 ENCOUNTER — Other Ambulatory Visit: Payer: Self-pay | Admitting: Family

## 2023-10-30 ENCOUNTER — Encounter: Payer: Self-pay | Admitting: Family

## 2023-10-31 ENCOUNTER — Ambulatory Visit (INDEPENDENT_AMBULATORY_CARE_PROVIDER_SITE_OTHER): Payer: Medicare Other | Admitting: Family

## 2023-10-31 ENCOUNTER — Other Ambulatory Visit (HOSPITAL_BASED_OUTPATIENT_CLINIC_OR_DEPARTMENT_OTHER): Payer: Self-pay

## 2023-10-31 VITALS — BP 137/66 | HR 71 | Temp 98.5°F | Resp 16 | Ht 65.0 in | Wt 181.0 lb

## 2023-10-31 DIAGNOSIS — I2510 Atherosclerotic heart disease of native coronary artery without angina pectoris: Secondary | ICD-10-CM | POA: Diagnosis not present

## 2023-10-31 DIAGNOSIS — Z23 Encounter for immunization: Secondary | ICD-10-CM | POA: Diagnosis not present

## 2023-10-31 DIAGNOSIS — R110 Nausea: Secondary | ICD-10-CM

## 2023-10-31 DIAGNOSIS — K219 Gastro-esophageal reflux disease without esophagitis: Secondary | ICD-10-CM

## 2023-10-31 DIAGNOSIS — H524 Presbyopia: Secondary | ICD-10-CM | POA: Diagnosis not present

## 2023-10-31 DIAGNOSIS — H52223 Regular astigmatism, bilateral: Secondary | ICD-10-CM | POA: Diagnosis not present

## 2023-10-31 DIAGNOSIS — H5203 Hypermetropia, bilateral: Secondary | ICD-10-CM | POA: Diagnosis not present

## 2023-10-31 DIAGNOSIS — E119 Type 2 diabetes mellitus without complications: Secondary | ICD-10-CM | POA: Diagnosis not present

## 2023-10-31 DIAGNOSIS — H35033 Hypertensive retinopathy, bilateral: Secondary | ICD-10-CM | POA: Diagnosis not present

## 2023-10-31 DIAGNOSIS — Z780 Asymptomatic menopausal state: Secondary | ICD-10-CM

## 2023-10-31 DIAGNOSIS — H2513 Age-related nuclear cataract, bilateral: Secondary | ICD-10-CM | POA: Diagnosis not present

## 2023-10-31 DIAGNOSIS — E1165 Type 2 diabetes mellitus with hyperglycemia: Secondary | ICD-10-CM | POA: Diagnosis not present

## 2023-10-31 DIAGNOSIS — Z87891 Personal history of nicotine dependence: Secondary | ICD-10-CM

## 2023-10-31 LAB — HM DIABETES EYE EXAM

## 2023-10-31 MED ORDER — ONDANSETRON HCL 4 MG PO TABS
4.0000 mg | ORAL_TABLET | Freq: Three times a day (TID) | ORAL | 0 refills | Status: AC | PRN
  Filled 2023-10-31: qty 30, 10d supply, fill #0

## 2023-10-31 MED ORDER — TETANUS-DIPHTHERIA TOXOIDS TD 2-2 LF/0.5ML IM SUSP
0.5000 mL | Freq: Once | INTRAMUSCULAR | 0 refills | Status: AC
  Filled 2023-10-31: qty 0.5, 1d supply, fill #0

## 2023-10-31 MED ORDER — SHINGRIX 50 MCG/0.5ML IM SUSR
INTRAMUSCULAR | 1 refills | Status: DC
  Filled 2023-10-31: qty 0.5, fill #0

## 2023-10-31 NOTE — Assessment & Plan Note (Signed)
Diet controlled. Update A1C.

## 2023-10-31 NOTE — Assessment & Plan Note (Signed)
Scheduled to see cardiology later this week.

## 2023-10-31 NOTE — Progress Notes (Signed)
Subjective:     Patient ID: Jade [name], female    DOB: May 03, 1956, 67 y.o.   MRN: [id number]  Chief Complaint  Patient presents with   Hypertension    Here for follow up    Hypertension    Discussed the use of AI scribe software for clinical note transcription with the patient, who gave verbal consent to proceed.  History of Present Illness          Patient presents today for routine follow up. She complains of regular nausea for the last few months which is relieved by zofran. Reports good compliance with pantoprazole.   She is no longer smoking.  Reports good compliance with bp meds.   DM2-  Lab Results  Component Value Date   HGBA1C 5.9 (H) 04/07/2023   HGBA1C 6.5 02/20/2023   HGBA1C 6.6 (H) 05/20/2022   Lab Results  Component Value Date   MICROALBUR 1.8 09/02/2022   LDLCALC 78 05/20/2022   CREATININE 1.06 (H) 09/30/2023      Health Maintenance Due  Topic Date Due   Zoster Vaccines- Shingrix (1 of 2) 08/06/1973   Lung Cancer Screening  Never done   DTaP/Tdap/Td (3 - Td or Tdap) 07/14/2021   COVID-19 Vaccine (4 - 2023-24 season) 07/27/2023   Diabetic kidney evaluation - Urine ACR  09/03/2023   DEXA SCAN  09/18/2023   HEMOGLOBIN A1C  10/08/2023    Past Medical History:  Diagnosis Date   Abnormal EKG 07/16/2011   ACS (acute coronary syndrome) (HCC) 10/22/2019   Anemia    Benign microscopic hematuria    neg cystoscopy 11/12- dr. Brunilda Payor   Coronary artery disease    DJD (degenerative joint disease)    L KNEE   Family history of anesthesia complication    multiple family members with history of this   GERD (gastroesophageal reflux disease)    Hurthle cell neoplasm of thyroid    Hyperlipidemia LDL goal <70 10/23/2019   Hypertension    Hypertensive retinopathy    Malignant hyperthermia    NSTEMI (non-ST elevated myocardial infarction) (HCC) 10/22/2019   Numbness and tingling in left arm     Past Surgical History:  Procedure Laterality Date    ABDOMINAL HYSTERECTOMY  1996   BREAST EXCISIONAL BIOPSY Left 01/2016   BREAST EXCISIONAL BIOPSY Left 03/2016   CARDIAC CATHETERIZATION     CERVICAL DISC SURGERY  2013   Dr. Lovell Sheehan   COLONOSCOPY  2010   in [place], [place]-normal exam   CORONARY BALLOON ANGIOPLASTY N/A 10/25/2019   Procedure: CORONARY BALLOON ANGIOPLASTY;  Surgeon: Lennette Bihari, MD;  Location: [facility];  Service: Cardiovascular;  Laterality: N/A;   CORONARY STENT INTERVENTION N/A 10/22/2019   Procedure: CORONARY STENT INTERVENTION;  Surgeon: Lennette Bihari, MD;  Location: [facility];  Service: Cardiovascular;  Laterality: N/A;  mid lad    CORONARY STENT INTERVENTION N/A 10/25/2019   Procedure: CORONARY STENT INTERVENTION;  Surgeon: Lennette Bihari, MD;  Location: [facility];  Service: Cardiovascular;  Laterality: N/A;   DILATION AND CURETTAGE OF UTERUS     KNEE CARTILAGE SURGERY  1999   right knee   LEFT HEART CATH AND CORONARY ANGIOGRAPHY N/A 10/22/2019   Procedure: LEFT HEART CATH AND CORONARY ANGIOGRAPHY;  Surgeon: Lennette Bihari, MD;  Location: [facility];  Service: Cardiovascular;  Laterality: N/A;   ROTATOR CUFF REPAIR Right 05/25/2013   TOTAL HIP ARTHROPLASTY Right 04/17/2023   Procedure: TOTAL  HIP ARTHROPLASTY ANTERIOR APPROACH;  Surgeon: Samson Frederic, MD;  Location: [facility];  Service: Orthopedics;  Laterality: Right;   TOTAL KNEE ARTHROPLASTY Left 01/20/2014   Procedure: LEFT TOTAL KNEE ARTHROPLASTY;  Surgeon: Javier Docker, MD;  Location: [facility];  Service: Orthopedics;  Laterality: Left;   TOTAL KNEE ARTHROPLASTY Right 01/19/2015   Procedure: RIGHT TOTAL KNEE ARTHROPLASTY;  Surgeon: Javier Docker, MD;  Location: [facility];  Service: Orthopedics;  Laterality: Right;   TUBAL LIGATION      Family History  Problem Relation Age of Onset   Hypertension Mother    Hyperlipidemia Mother    Heart disease Father        CAD; stent at age 29   Diabetes Father     Hypertension Father    Hyperlipidemia Father    Cancer Father        prostate   Dementia Father    Osteoarthritis Sister    Hypertension Sister    COPD Sister    CAD Sister        s/p stent   Edema Sister        cause unknown   Osteoporosis Sister        bad hips, bed bound, has one leg amputated   Drug abuse Sister    Dementia Paternal Grandmother    Cancer Paternal Aunt        BREAST   Breast cancer Paternal Aunt    Colon cancer Neg Hx     Social History   Socioeconomic History   Marital status: Married    Spouse name: Chrissie Noa   Number of children: 3   Years of education: Not on file   Highest education level: Not on file  Occupational History    Comment: works part-time at [employer]  Tobacco Use   Smoking status: Former    Current packs/day: 0.00    Average packs/day: 0.5 packs/day for 45.0 years (22.5 ttl pk-yrs)    Types: Cigarettes    Start date: 01/23/1978    Quit date: 01/24/2023    Years since quitting: 0.7   Smokeless tobacco: Never  Vaping Use   Vaping status: Never Used  Substance and Sexual Activity   Alcohol use: No    Alcohol/week: 0.0 standard drinks of alcohol   Drug use: No   Sexual activity: Yes    Partners: Male  Other Topics Concern   Not on file  Social History Narrative   Regular exercise:  Patient was walking at work 4 times per week until hip replacement surgery 03/2023   Caffeine Use:  2 glasses of soda daily.   Married, 3 children- grown   Completed 12th grade.   Social Determinants of Health   Financial Resource Strain: Low Risk  (06/03/2023)   Overall Financial Resource Strain (CARDIA)    Difficulty of Paying Living Expenses: Not hard at all  Food Insecurity: No Food Insecurity (06/03/2023)   Hunger Vital Sign    Worried About Running Out of Food in the Last Year: Never true    Ran Out of Food in the Last Year: Never true  Transportation Needs: No Transportation Needs (06/03/2023)   PRAPARE - Administrator, Civil Service  (Medical): No    Lack of Transportation (Non-Medical): No  Physical Activity: Insufficiently Active (06/03/2023)   Exercise Vital Sign    Days of Exercise per Week: 4 days    Minutes of Exercise per Session: 10 min  Stress: No Stress Concern Present (  06/03/2023)   [employer] [employer] - Occupational Stress Questionnaire    Feeling of Stress : Not at all  Social Connections: Socially Integrated (06/03/2023)   Social Connection and Isolation Panel [NHANES]    Frequency of Communication with Friends and Family: More than three times a week    Frequency of Social Gatherings with Friends and Family: Once a week    Attends Religious Services: More than 4 times per year    Active Member of [employer] or Organizations: Yes    Attends Engineer, structural: More than 4 times per year    Marital Status: Married  Catering manager Violence: Not At Risk (06/03/2023)   Humiliation, Afraid, Rape, and Kick questionnaire    Fear of Current or Ex-Partner: No    Emotionally Abused: No    Physically Abused: No    Sexually Abused: No    Outpatient Medications Prior to Visit  Medication Sig Dispense Refill   aspirin EC 81 MG tablet Take 81 mg by mouth daily. Swallow whole.     carvedilol (COREG) 12.5 MG tablet Take 1 tablet (12.5 mg total) by mouth 2 (two) times daily with a meal. 180 tablet 1   fluconazole (DIFLUCAN) 150 MG tablet Take 1 tablet (150 mg total) by mouth daily. 1 tablet 1   gabapentin (NEURONTIN) 300 MG capsule TAKE 1 CAPSULE BY MOUTH DAILY IN THE MORNING, 1 AT NOON AND 2 AT BEDTIME. 360 capsule 1   hydrALAZINE (APRESOLINE) 50 MG tablet Take 50 mg by mouth 2 (two) times daily.     isosorbide mononitrate (IMDUR) 30 MG 24 hr tablet Take 1 tablet (30 mg total) by mouth daily. 90 tablet 1   linaclotide (LINZESS) 145 MCG CAPS capsule Take 1 capsule (145 mcg total) by mouth daily as needed (constipation). 30 capsule 1   losartan (COZAAR) 100 MG tablet TAKE 1 TABLET BY MOUTH EVERY  DAY 90 tablet 1   methocarbamol (ROBAXIN) 500 MG tablet Take 1 tablet (500 mg total) by mouth every 6 (six) hours as needed for muscle spasms. 20 tablet 0   nitroGLYCERIN (NITROSTAT) 0.4 MG SL tablet Place 1 tablet (0.4 mg total) under the tongue every 5 (five) minutes as needed for chest pain (CP or SOB). 25 tablet 3   oxyCODONE-acetaminophen (PERCOCET) 7.5-325 MG tablet Take 1 tablet by mouth every 4 (four) hours as needed for severe pain. 42 tablet 0   pantoprazole (PROTONIX) 40 MG tablet TAKE 1 TABLET BY MOUTH TWICE A DAY 180 tablet 1   potassium chloride SA (KLOR-CON M20) 20 MEQ tablet TAKE 1 TABLET BY MOUTH TWICE A DAY 180 tablet 1   rosuvastatin (CRESTOR) 20 MG tablet TAKE 1 TABLET BY MOUTH EVERY DAY 90 tablet 1   ondansetron (ZOFRAN) 4 MG tablet Take 1 tablet (4 mg total) by mouth every 8 (eight) hours as needed for nausea or vomiting. 30 tablet 0   predniSONE (DELTASONE) 20 MG tablet Take 1 tablet (20 mg total) by mouth daily with breakfast. 5 tablet 0   sucralfate (CARAFATE) 1 g tablet Take 1 tablet (1 g total) by mouth 4 (four) times daily -  with meals and at bedtime for 14 days. 56 tablet 0   No facility-administered medications prior to visit.    Allergies  Allergen Reactions   Shrimp [Shellfish Allergy] Itching    Rash and lips itching   Ace Inhibitors Cough    Cough   Aspirin Nausea And Vomiting    Ok with  taking coated 81mg    Azithromycin Rash    ROS    See HPI Objective:    Physical Exam Constitutional:      General: She is not in acute distress.    Appearance: Normal appearance. She is well-developed.  HENT:     Head: Normocephalic and atraumatic.     Right Ear: External ear normal.     Left Ear: External ear normal.  Eyes:     General: No scleral icterus. Neck:     Thyroid: No thyromegaly.  Cardiovascular:     Rate and Rhythm: Normal rate and regular rhythm.     Heart sounds: Normal heart sounds. No murmur heard. Pulmonary:     Effort: Pulmonary  effort is normal. No respiratory distress.     Breath sounds: Normal breath sounds. No wheezing.  Musculoskeletal:     Cervical back: Neck supple.  Skin:    General: Skin is warm and dry.  Neurological:     Mental Status: She is alert and oriented to person, place, and time.  Psychiatric:        Mood and Affect: Mood normal.        Behavior: Behavior normal.        Thought Content: Thought content normal.        Judgment: Judgment normal.      BP 137/66 (BP Location: Right Arm, Patient Position: Sitting, Cuff Size: Normal)   Pulse 71   Temp 98.5 F (36.9 C) (Oral)   Resp 16   Ht 5\' 5"  (1.651 m)   Wt 181 lb (82.1 kg)   LMP 11/25/1994   SpO2 98%   BMI 30.12 kg/m  Wt Readings from Last 3 Encounters:  10/31/23 181 lb (82.1 kg)  09/30/23 174 lb 2.6 oz (79 kg)  09/30/23 176 lb (79.8 kg)       Assessment & Plan:   Problem List Items Addressed This Visit       Unprioritized   GERD (gastroesophageal reflux disease)    Stable on pantoprazole, continue same.       Relevant Medications   ondansetron (ZOFRAN) 4 MG tablet   Diabetes type 2, controlled (HCC)    Diet controlled. Update A1C.       Relevant Orders   HgB A1c   Urine Microalbumin w/creat. ratio   Coronary artery disease involving native heart without angina pectoris    Scheduled to see cardiology later this week.       Chronic nausea    New, ? Gastroparesis in the setting of DM2- refer to gi for further evaluation.       Relevant Medications   ondansetron (ZOFRAN) 4 MG tablet   Other Relevant Orders   Ambulatory referral to Gastroenterology   Other Visit Diagnoses     History of tobacco abuse    -  Primary   Relevant Orders   CT CHEST LUNG CA SCREEN LOW DOSE W/O CM   Postmenopausal estrogen deficiency       Relevant Orders   DG Bone Density   Needs flu shot       Relevant Orders   Flu Vaccine Trivalent High Dose (Fluad) (Completed)       I have discontinued [employer]'s predniSONE.  I am also having her start on diptheria-tetanus toxoids. Additionally, I am having her maintain her carvedilol, isosorbide mononitrate, nitroGLYCERIN, hydrALAZINE, oxyCODONE-acetaminophen, methocarbamol, fluconazole, aspirin EC, linaclotide, pantoprazole, gabapentin, potassium chloride SA, sucralfate, rosuvastatin, losartan, ondansetron, and Shingrix.  Meds ordered this  encounter  Medications   diptheria-tetanus toxoids (DECAVAC) 2-2 LF/0.5ML injection    Sig: Inject 0.5 mLs into the muscle once for 1 dose.    Dispense:  0.5 mL    Refill:  0    Order Specific Question:   Supervising Provider    Answer:   Danise Edge A [[id number]]   DISCONTD: Zoster Vaccine Adjuvanted [facility]) injection    Sig: 0.5mg  IM now and again in 2-6 months    Dispense:  0.5 mL    Refill:  1    Order Specific Question:   Supervising Provider    Answer:   Danise Edge A [[id number]]   ondansetron (ZOFRAN) 4 MG tablet    Sig: Take 1 tablet (4 mg total) by mouth every 8 (eight) hours as needed for nausea or vomiting.    Dispense:  30 tablet    Refill:  0    Order Specific Question:   Supervising Provider    Answer:   Danise Edge A [[id number]]   Zoster Vaccine Adjuvanted [facility]) injection    Sig: 0.5mg  IM now and again in 2-6 months    Dispense:  0.5 mL    Refill:  1    Order Specific Question:   Supervising Provider    Answer:   Danise Edge A [[id number]]

## 2023-10-31 NOTE — Assessment & Plan Note (Signed)
New, ? Gastroparesis in the setting of DM2- refer to gi for further evaluation.

## 2023-10-31 NOTE — Assessment & Plan Note (Signed)
Stable on pantoprazole, continue same.

## 2023-11-01 LAB — HEMOGLOBIN A1C
Hgb A1c MFr Bld: 6 %{Hb} — ABNORMAL HIGH (ref <5.7)
Mean Plasma Glucose: 126 mg/dL
eAG (mmol/L): 7 mmol/L

## 2023-11-01 LAB — MICROALBUMIN / CREATININE URINE RATIO
Creatinine, Urine: 72 mg/dL (ref 20–275)
Microalb Creat Ratio: 6 mg/g{creat} (ref <30)
Microalb, Ur: 0.4 mg/dL

## 2023-11-03 ENCOUNTER — Encounter: Payer: Self-pay | Admitting: Family

## 2023-11-03 DIAGNOSIS — I10 Essential (primary) hypertension: Secondary | ICD-10-CM | POA: Diagnosis not present

## 2023-11-03 DIAGNOSIS — I2510 Atherosclerotic heart disease of native coronary artery without angina pectoris: Secondary | ICD-10-CM | POA: Diagnosis not present

## 2023-11-03 DIAGNOSIS — E785 Hyperlipidemia, unspecified: Secondary | ICD-10-CM | POA: Diagnosis not present

## 2023-11-10 ENCOUNTER — Telehealth (HOSPITAL_BASED_OUTPATIENT_CLINIC_OR_DEPARTMENT_OTHER): Payer: Self-pay | Admitting: Family

## 2023-11-13 ENCOUNTER — Telehealth (HOSPITAL_BASED_OUTPATIENT_CLINIC_OR_DEPARTMENT_OTHER): Payer: Self-pay | Admitting: Family

## 2023-11-17 ENCOUNTER — Ambulatory Visit (HOSPITAL_BASED_OUTPATIENT_CLINIC_OR_DEPARTMENT_OTHER)
Admission: RE | Admit: 2023-11-17 | Discharge: 2023-11-17 | Disposition: A | Discharge status: Home / Self Care | Payer: Medicare Other | Source: Ambulatory Visit | Attending: Family | Admitting: Family

## 2023-11-17 DIAGNOSIS — F17210 Nicotine dependence, cigarettes, uncomplicated: Secondary | ICD-10-CM | POA: Diagnosis not present

## 2023-11-17 DIAGNOSIS — Z87891 Personal history of nicotine dependence: Secondary | ICD-10-CM | POA: Insufficient documentation

## 2023-11-21 ENCOUNTER — Other Ambulatory Visit: Payer: Self-pay | Admitting: Family

## 2023-11-21 DIAGNOSIS — R110 Nausea: Secondary | ICD-10-CM

## 2023-11-21 NOTE — Telephone Encounter (Signed)
Copied from CRM [id number]. Topic: Clinical - Medication Refill >> Nov 21, 2023  9:16 AM Lennart Pall wrote: Most Recent Primary Care Visit:  Provider: Peggyann Juba, MELISSA  Department: [facility]  Visit Type: OFFICE VISIT  Date: 10/31/2023  Medication: ***  Has the patient contacted their pharmacy?  (Agent: If no, request that the patient contact the pharmacy for the refill. If patient does not wish to contact the pharmacy document the reason why and proceed with request.) (Agent: If yes, when and what did the pharmacy advise?)  Is this the correct pharmacy for this prescription?  If no, delete pharmacy and type the correct one.  This is the patient's preferred pharmacy:  [facility] [place] [facility] [address], [address] [place] [address] Phone: [phone] Fax: [phone]  [facility] - [place], [place] - [address] [facility] [address] [place] [address] [place] [address] Phone: [phone] Fax: [phone]  [id number] [place],  - [address] [place] [address] [place] [address] Phone: [phone] Fax: [phone]  [facility] [address] [place] [address] Phone: [phone] Fax: [phone]   Has the prescription been filled recently?   Is the patient out of the medication?   Has the patient been seen for an appointment in the last year OR does the patient have an upcoming appointment?   Can we respond through MyChart?   Agent: Please be advised that Rx refills may take up to 3 business days. We ask that you follow-up with your pharmacy.

## 2023-11-24 DIAGNOSIS — Z79891 Long term (current) use of opiate analgesic: Secondary | ICD-10-CM | POA: Diagnosis not present

## 2023-11-24 DIAGNOSIS — G894 Chronic pain syndrome: Secondary | ICD-10-CM | POA: Diagnosis not present

## 2023-11-24 DIAGNOSIS — M5416 Radiculopathy, lumbar region: Secondary | ICD-10-CM | POA: Diagnosis not present

## 2023-12-01 ENCOUNTER — Other Ambulatory Visit (HOSPITAL_BASED_OUTPATIENT_CLINIC_OR_DEPARTMENT_OTHER): Payer: Self-pay

## 2023-12-01 ENCOUNTER — Encounter: Payer: Self-pay | Admitting: Family

## 2023-12-01 ENCOUNTER — Ambulatory Visit (HOSPITAL_BASED_OUTPATIENT_CLINIC_OR_DEPARTMENT_OTHER)
Admission: RE | Admit: 2023-12-01 | Discharge: 2023-12-01 | Disposition: A | Discharge status: Home / Self Care | Payer: Medicare Other | Source: Ambulatory Visit | Attending: Family | Admitting: Family

## 2023-12-01 DIAGNOSIS — M85852 Other specified disorders of bone density and structure, left thigh: Secondary | ICD-10-CM | POA: Diagnosis not present

## 2023-12-01 DIAGNOSIS — Z780 Asymptomatic menopausal state: Secondary | ICD-10-CM | POA: Insufficient documentation

## 2023-12-02 ENCOUNTER — Encounter: Payer: Self-pay | Admitting: Family

## 2023-12-02 NOTE — Progress Notes (Signed)
Printed out for patient 

## 2023-12-18 DIAGNOSIS — M5416 Radiculopathy, lumbar region: Secondary | ICD-10-CM | POA: Diagnosis not present

## 2024-01-08 ENCOUNTER — Ambulatory Visit: Payer: Self-pay | Admitting: Family

## 2024-01-08 NOTE — Telephone Encounter (Signed)
  Chief Complaint: LUQ pain Symptoms: intermittent LUQ pain Frequency: couple days Pertinent Negatives: Patient denies fever, GU/GI s/s, SOB, N/V,  Disposition: [] ED /[] Urgent Care (no appt availability in office) / [x] Appointment(In office/virtual)/ []  [facility]/ [] Home Care/ [] Refused Recommended Disposition /[] [facility]/ []  Follow-up with PCP Additional Notes: Pt states that she had LUQ pain that is intermittent and started a couple days ago. Pt denies SOB. Pt advised of care instructions per Epic, pt agreeable and understands. Pt sched for tomorrow with PCP, offered pt appts today with other providers pcp office, pt declined.  Copied from CRM [id number]. Topic: Clinical - Red Word Triage >> Jan 08, 2024  8:19 AM Jade [name] wrote: Red Word that prompted transfer to Nurse Triage: Hurting in side , sharp pain Reason for Disposition  [1] MODERATE pain (e.g., interferes with normal activities) AND [2] comes and goes (cramps) AND [3] present > 24 hours  (Exception: Pain with Vomiting or Diarrhea - see that Guideline.)  Answer Assessment - Initial Assessment Questions 1. LOCATION: "Where does it hurt?"      L ABD pain 2. RADIATION: "Does the pain shoot anywhere else?" (e.g., chest, back)     denies 3. ONSET: "When did the pain begin?" (e.g., minutes, hours or days ago)      Couple days 4. SUDDEN: "Gradual or sudden onset?"     Not gradual, intermittent 5. PATTERN "Does the pain come and go, or is it constant?"    - If it comes and goes: "How long does it last?" "Do you have pain now?"     (Note: Comes and goes means the pain is intermittent. It goes away completely between bouts.)    - If constant: "Is it getting better, staying the same, or getting worse?"      (Note: Constant means the pain never goes away completely; most serious pain is constant and gets worse.)      Comes and goes 6. SEVERITY: "How bad is the pain?"  (e.g., Scale 1-10; mild, moderate, or  severe)    - MILD (1-3): Doesn't interfere with normal activities, abdomen soft and not tender to touch..     - MODERATE (4-7): Interferes with normal activities or awakens from sleep, abdomen tender to touch.     - SEVERE (8-10): Excruciating pain, doubled over, unable to do any normal activities.       7 7. RECURRENT SYMPTOM: "Have you ever had this type of stomach pain before?" If Yes, ask: "When was the last time?" and "What happened that time?"      denies 8. AGGRAVATING FACTORS: "Does anything seem to cause this pain?" (e.g., foods, stress, alcohol)     denies 9. CARDIAC SYMPTOMS: "Do you have any of the following symptoms: chest pain, difficulty breathing, sweating, nausea?"     denies 10. OTHER SYMPTOMS: "Do you have any other symptoms?" (e.g., back pain, diarrhea, fever, urination pain, vomiting)       denies  Protocols used: Abdominal Pain - Upper-A-AH

## 2024-01-09 ENCOUNTER — Ambulatory Visit (INDEPENDENT_AMBULATORY_CARE_PROVIDER_SITE_OTHER): Payer: Medicare Other | Admitting: Family

## 2024-01-09 ENCOUNTER — Other Ambulatory Visit (HOSPITAL_BASED_OUTPATIENT_CLINIC_OR_DEPARTMENT_OTHER): Payer: Self-pay

## 2024-01-09 VITALS — BP 128/81 | HR 70 | Temp 98.2°F | Resp 16 | Ht 65.0 in | Wt 173.0 lb

## 2024-01-09 DIAGNOSIS — R109 Unspecified abdominal pain: Secondary | ICD-10-CM | POA: Diagnosis not present

## 2024-01-09 DIAGNOSIS — R29898 Other symptoms and signs involving the musculoskeletal system: Secondary | ICD-10-CM

## 2024-01-09 DIAGNOSIS — R059 Cough, unspecified: Secondary | ICD-10-CM

## 2024-01-09 DIAGNOSIS — U071 COVID-19: Secondary | ICD-10-CM

## 2024-01-09 DIAGNOSIS — R531 Weakness: Secondary | ICD-10-CM

## 2024-01-09 LAB — POC INFLUENZA A&B (BINAX/QUICKVUE)
Influenza A, POC: NEGATIVE
Influenza B, POC: NEGATIVE

## 2024-01-09 LAB — POC COVID19 BINAXNOW: SARS Coronavirus 2 Ag: POSITIVE — AB

## 2024-01-09 MED ORDER — METHYLPREDNISOLONE 4 MG PO TBPK
ORAL_TABLET | ORAL | 0 refills | Status: DC
  Filled 2024-01-09: qty 21, 6d supply, fill #0

## 2024-01-09 MED ORDER — BENZONATATE 100 MG PO CAPS
100.0000 mg | ORAL_CAPSULE | Freq: Three times a day (TID) | ORAL | 0 refills | Status: DC | PRN
  Filled 2024-01-09: qty 20, 7d supply, fill #0

## 2024-01-09 NOTE — Patient Instructions (Signed)
Please continue to mask around other for the next 5 days since you have covid.  Be sure to drink plenty of fluids and rest. Since you have had symptoms for 5 days, you are outside of the treatment window for Paxlovid antiviral. If you develop fever, worsening cough or shortness of breath, please let us know.

## 2024-01-09 NOTE — Progress Notes (Signed)
 3  Subjective:     Patient ID: PAMI WOOL, female    DOB: 1956/10/08, 68 y.o.   MRN: [id number]  Chief Complaint  Patient presents with   Flank Pain    Patient complains of left flank pain for 6 days, getting worse. Denies nausea or vomiting.    Cough    Complains of productive cough for about 5 days    HPI  Discussed the use of AI scribe software for clinical note transcription with the patient, who gave verbal consent to proceed.  History of Present Illness   Jade [name] is a 68 year old female who presents with left lower back pain and associated symptoms.  She has been experiencing left lower back pain that began as mild discomfort on Saturday and significantly worsened by Wednesday. The pain is localized to the lower back and radiates around the area. No rash is associated with the pain.  She reports episodes of leg weakness, with her legs giving out on her, resulting in three falls, all occurring on Wednesday. She also experiences numbness in the left thigh.  She has had issues with bowel and bladder control, noting an accident on Tuesday morning at work where she was unable to reach the bathroom in time, resulting in a loose bowel movement. She also mentions difficulty with bladder control, having had an accident where she urinated before reaching the bathroom. While bladder control issues are not new, the bowel accident was unusual. She denies saddle paresthesia  She reports a persistent cough without sore throat or ear pain, and a runny nose. No body aches except for the pain in her back and knees. She also experienced epistaxis before leaving home.      Wt Readings from Last 3 Encounters:  01/09/24 173 lb (78.5 kg)  10/31/23 181 lb (82.1 kg)  09/30/23 174 lb 2.6 oz (79 kg)      Health Maintenance Due  Topic Date Due   Zoster Vaccines- Shingrix (1 of 2) 08/06/1973   DTaP/Tdap/Td (3 - Td or Tdap) 07/14/2021   COVID-19 Vaccine (4 - 2024-25 season) 07/27/2023     Past Medical History:  Diagnosis Date   Abnormal EKG 07/16/2011   ACS (acute coronary syndrome) (HCC) 10/22/2019   Anemia    Benign microscopic hematuria    neg cystoscopy 11/12- dr. Brunilda Payor   Coronary artery disease    DJD (degenerative joint disease)    L KNEE   Family history of anesthesia complication    multiple family members with history of this   GERD (gastroesophageal reflux disease)    Hurthle cell neoplasm of thyroid    Hyperlipidemia LDL goal <70 10/23/2019   Hypertension    Hypertensive retinopathy    Malignant hyperthermia    NSTEMI (non-ST elevated myocardial infarction) (HCC) 10/22/2019   Numbness and tingling in left arm    Osteopenia     Past Surgical History:  Procedure Laterality Date   ABDOMINAL HYSTERECTOMY  1996   BREAST EXCISIONAL BIOPSY Left 01/2016   BREAST EXCISIONAL BIOPSY Left 03/2016   CARDIAC CATHETERIZATION     CERVICAL DISC SURGERY  2013   Dr. Lovell Sheehan   COLONOSCOPY  2010   in [place]-normal exam   CORONARY BALLOON ANGIOPLASTY N/A 10/25/2019   Procedure: CORONARY BALLOON ANGIOPLASTY;  Surgeon: Lennette Bihari, MD;  Location: [facility];  Service: Cardiovascular;  Laterality: N/A;   CORONARY STENT INTERVENTION N/A 10/22/2019   Procedure: CORONARY STENT INTERVENTION;  Surgeon: Tresa Endo,  Clovis Pu, MD;  Location: [facility];  Service: Cardiovascular;  Laterality: N/A;  mid lad    CORONARY STENT INTERVENTION N/A 10/25/2019   Procedure: CORONARY STENT INTERVENTION;  Surgeon: Lennette Bihari, MD;  Location: [facility];  Service: Cardiovascular;  Laterality: N/A;   DILATION AND CURETTAGE OF UTERUS     KNEE CARTILAGE SURGERY  1999   right knee   LEFT HEART CATH AND CORONARY ANGIOGRAPHY N/A 10/22/2019   Procedure: LEFT HEART CATH AND CORONARY ANGIOGRAPHY;  Surgeon: Lennette Bihari, MD;  Location: [facility];  Service: Cardiovascular;  Laterality: N/A;   ROTATOR CUFF REPAIR Right 05/25/2013   TOTAL HIP  ARTHROPLASTY Right 04/17/2023   Procedure: TOTAL HIP ARTHROPLASTY ANTERIOR APPROACH;  Surgeon: Samson Frederic, MD;  Location: [facility];  Service: Orthopedics;  Laterality: Right;   TOTAL KNEE ARTHROPLASTY Left 01/20/2014   Procedure: LEFT TOTAL KNEE ARTHROPLASTY;  Surgeon: Javier Docker, MD;  Location: [facility];  Service: Orthopedics;  Laterality: Left;   TOTAL KNEE ARTHROPLASTY Right 01/19/2015   Procedure: RIGHT TOTAL KNEE ARTHROPLASTY;  Surgeon: Javier Docker, MD;  Location: [facility];  Service: Orthopedics;  Laterality: Right;   TUBAL LIGATION      Family History  Problem Relation Age of Onset   Hypertension Mother    Hyperlipidemia Mother    Heart disease Father        CAD; stent at age 37   Diabetes Father    Hypertension Father    Hyperlipidemia Father    Cancer Father        prostate   Dementia Father    Osteoarthritis Sister    Hypertension Sister    COPD Sister    CAD Sister        s/p stent   Edema Sister        cause unknown   Osteoporosis Sister        bad hips, bed bound, has one leg amputated   Drug abuse Sister    Dementia Paternal Grandmother    Cancer Paternal Aunt        BREAST   Breast cancer Paternal Aunt    Colon cancer Neg Hx     Social History   Socioeconomic History   Marital status: Married    Spouse name: Chrissie Noa   Number of children: 3   Years of education: Not on file   Highest education level: Not on file  Occupational History    Comment: works part-time at [employer]  Tobacco Use   Smoking status: Former    Current packs/day: 0.00    Average packs/day: 0.5 packs/day for 45.0 years (22.5 ttl pk-yrs)    Types: Cigarettes    Start date: 01/23/1978    Quit date: 01/24/2023    Years since quitting: 0.9   Smokeless tobacco: Never  Vaping Use   Vaping status: Never Used  Substance and Sexual Activity   Alcohol use: No    Alcohol/week: 0.0 standard drinks of alcohol   Drug use: No   Sexual activity: Yes    Partners: Male  Other Topics  Concern   Not on file  Social History Narrative   Regular exercise:  Patient was walking at work 4 times per week until hip replacement surgery 03/2023   Caffeine Use:  2 glasses of soda daily.   Married, 3 children- grown   Completed 12th grade.   Social Drivers of Corporate investment banker Strain: Low Risk  (  06/03/2023)   Overall Financial Resource Strain (CARDIA)    Difficulty of Paying Living Expenses: Not hard at all  Food Insecurity: No Food Insecurity (06/03/2023)   Hunger Vital Sign    Worried About Running Out of Food in the Last Year: Never true    Ran Out of Food in the Last Year: Never true  Transportation Needs: No Transportation Needs (06/03/2023)   PRAPARE - Administrator, Civil Service (Medical): No    Lack of Transportation (Non-Medical): No  Physical Activity: Insufficiently Active (06/03/2023)   Exercise Vital Sign    Days of Exercise per Week: 4 days    Minutes of Exercise per Session: 10 min  Stress: No Stress Concern Present (06/03/2023)   [employer] of Occupational Health - Occupational Stress Questionnaire    Feeling of Stress : Not at all  Social Connections: Socially Integrated (06/03/2023)   Social Connection and Isolation Panel [NHANES]    Frequency of Communication with Friends and Family: More than three times a week    Frequency of Social Gatherings with Friends and Family: Once a week    Attends Religious Services: More than 4 times per year    Active Member of [employer] or Organizations: Yes    Attends Engineer, structural: More than 4 times per year    Marital Status: Married  Catering manager Violence: Not At Risk (06/03/2023)   Humiliation, Afraid, Rape, and Kick questionnaire    Fear of Current or Ex-Partner: No    Emotionally Abused: No    Physically Abused: No    Sexually Abused: No    Outpatient Medications Prior to Visit  Medication Sig Dispense Refill   aspirin EC 81 MG tablet Take 81 mg by mouth daily. Swallow whole.      carvedilol (COREG) 12.5 MG tablet Take 1 tablet (12.5 mg total) by mouth 2 (two) times daily with a meal. 180 tablet 1   gabapentin (NEURONTIN) 300 MG capsule TAKE 1 CAPSULE BY MOUTH DAILY IN THE MORNING, 1 AT NOON AND 2 AT BEDTIME. 360 capsule 1   hydrALAZINE (APRESOLINE) 50 MG tablet Take 50 mg by mouth 2 (two) times daily.     isosorbide mononitrate (IMDUR) 30 MG 24 hr tablet Take 1 tablet (30 mg total) by mouth daily. 90 tablet 1   linaclotide (LINZESS) 145 MCG CAPS capsule Take 1 capsule (145 mcg total) by mouth daily as needed (constipation). 30 capsule 1   losartan (COZAAR) 100 MG tablet TAKE 1 TABLET BY MOUTH EVERY DAY 90 tablet 1   methocarbamol (ROBAXIN) 500 MG tablet Take 1 tablet (500 mg total) by mouth every 6 (six) hours as needed for muscle spasms. 20 tablet 0   nitroGLYCERIN (NITROSTAT) 0.4 MG SL tablet Place 1 tablet (0.4 mg total) under the tongue every 5 (five) minutes as needed for chest pain (CP or SOB). 25 tablet 3   ondansetron (ZOFRAN) 4 MG tablet Take 1 tablet (4 mg total) by mouth every 8 (eight) hours as needed for nausea or vomiting. 30 tablet 0   oxyCODONE-acetaminophen (PERCOCET) 7.5-325 MG tablet Take 1 tablet by mouth every 4 (four) hours as needed for severe pain. 42 tablet 0   pantoprazole (PROTONIX) 40 MG tablet TAKE 1 TABLET BY MOUTH TWICE A DAY 180 tablet 1   potassium chloride SA (KLOR-CON M20) 20 MEQ tablet TAKE 1 TABLET BY MOUTH TWICE A DAY 180 tablet 1   rosuvastatin (CRESTOR) 20 MG tablet TAKE 1 TABLET BY MOUTH  EVERY DAY 90 tablet 1   Zoster Vaccine Adjuvanted [facility]) injection 0.5mg  IM now and again in 2-6 months 0.5 mL 1   fluconazole (DIFLUCAN) 150 MG tablet Take 1 tablet (150 mg total) by mouth daily. 1 tablet 1   sucralfate (CARAFATE) 1 g tablet Take 1 tablet (1 g total) by mouth 4 (four) times daily -  with meals and at bedtime for 14 days. 56 tablet 0   No facility-administered medications prior to visit.    Allergies  Allergen Reactions    Shrimp [Shellfish Allergy] Itching    Rash and lips itching   Ace Inhibitors Cough    Cough   Aspirin Nausea And Vomiting    Ok with taking coated 81mg    Azithromycin Rash    ROS    See HPI Objective:    Physical Exam Constitutional:      General: She is not in acute distress.    Appearance: Normal appearance. She is well-developed.  HENT:     Head: Normocephalic and atraumatic.     Right Ear: Tympanic membrane, ear canal and external ear normal.     Left Ear: Tympanic membrane, ear canal and external ear normal.     Mouth/Throat:     Pharynx: No oropharyngeal exudate or posterior oropharyngeal erythema.  Eyes:     General: No scleral icterus. Neck:     Thyroid: No thyromegaly.  Cardiovascular:     Rate and Rhythm: Normal rate and regular rhythm.     Heart sounds: Normal heart sounds. No murmur heard. Pulmonary:     Effort: Pulmonary effort is normal. No respiratory distress.     Breath sounds: Normal breath sounds. No wheezing.  Musculoskeletal:     Cervical back: Neck supple.     Comments: Bilateral LE strength is 5/5  Lymphadenopathy:     Cervical: No cervical adenopathy.  Skin:    General: Skin is warm and dry.  Neurological:     Mental Status: She is alert and oriented to person, place, and time.     Deep Tendon Reflexes:     Reflex Scores:      Patellar reflexes are 2+ on the right side and 2+ on the left side. Psychiatric:        Mood and Affect: Mood normal.        Behavior: Behavior normal.        Thought Content: Thought content normal.        Judgment: Judgment normal.      BP 128/81 (BP Location: Right Arm, Patient Position: Sitting, Cuff Size: Small)   Pulse 70   Temp 98.2 F (36.8 C) (Oral)   Resp 16   Ht 5\' 5"  (1.651 m)   Wt 173 lb (78.5 kg)   LMP 11/25/1994   SpO2 99%   BMI 28.79 kg/m  Wt Readings from Last 3 Encounters:  01/09/24 173 lb (78.5 kg)  10/31/23 181 lb (82.1 kg)  09/30/23 174 lb 2.6 oz (79 kg)       Assessment &  Plan:   Problem List Items Addressed This Visit       Unprioritized   Weakness of both lower extremities   While neuro exam is normal, hx of falls and incontinence are concerning.  Will order MR lumbar spine to rule out spinal cord disease/compression.  In the meantime, I reviewed with the patient the importance of need to go to ER ASAP if recurrent fall or worsening incontinence/leg weakness.  Relevant Medications   methylPREDNISolone (MEDROL DOSEPAK) 4 MG TBPK tablet   Other Relevant Orders   Urine Culture   MR Lumbar Spine Wo Contrast   COVID-19 - Primary   New diagnosis- day 5 of symptoms. Pt is advised as follows:  Please continue to mask around other for the next 5 days since you have covid.  Be sure to drink plenty of fluids and rest. Since you have had symptoms for 5 days, you are outside of the treatment window for Paxlovid antiviral. If you develop fever, worsening cough or shortness of breath, please let us know.       Relevant Medications   benzonatate (TESSALON) 100 MG capsule   Other Relevant Orders   POC COVID-19 (Completed)   POC Influenza A&B (Binax test) (Completed)    I have discontinued Bonita Quin D. Carlin's fluconazole. I am also having her start on benzonatate and methylPREDNISolone. Additionally, I am having her maintain her carvedilol, isosorbide mononitrate, nitroGLYCERIN, hydrALAZINE, oxyCODONE-acetaminophen, methocarbamol, aspirin EC, linaclotide, pantoprazole, gabapentin, potassium chloride SA, sucralfate, rosuvastatin, losartan, ondansetron, and Shingrix.  Meds ordered this encounter  Medications   benzonatate (TESSALON) 100 MG capsule    Sig: Take 1 capsule (100 mg total) by mouth 3 (three) times daily as needed.    Dispense:  20 capsule    Refill:  0    Supervising Provider:   Danise Edge A [[id number]]   methylPREDNISolone (MEDROL DOSEPAK) 4 MG TBPK tablet    Sig: Take per package instructions    Dispense:  21 tablet    Refill:  0     Supervising Provider:   Danise Edge A [[id number]]

## 2024-01-10 DIAGNOSIS — U071 COVID-19: Secondary | ICD-10-CM | POA: Insufficient documentation

## 2024-01-10 DIAGNOSIS — R29898 Other symptoms and signs involving the musculoskeletal system: Secondary | ICD-10-CM | POA: Insufficient documentation

## 2024-01-10 HISTORY — DX: COVID-19: U07.1

## 2024-01-10 NOTE — Assessment & Plan Note (Signed)
 While neuro exam is normal, hx of falls and incontinence are concerning.  Will order MR lumbar spine to rule out spinal cord disease/compression.  In the meantime, I reviewed with the patient the importance of need to go to ER ASAP if recurrent fall or worsening incontinence/leg weakness.

## 2024-01-10 NOTE — Assessment & Plan Note (Signed)
 New diagnosis- day 5 of symptoms. Pt is advised as follows:  Please continue to mask around other for the next 5 days since you have covid.  Be sure to drink plenty of fluids and rest. Since you have had symptoms for 5 days, you are outside of the treatment window for Paxlovid antiviral. If you develop fever, worsening cough or shortness of breath, please let us know.

## 2024-01-11 LAB — URINE CULTURE
MICRO NUMBER:: 16086264
SPECIMEN QUALITY:: ADEQUATE

## 2024-01-12 ENCOUNTER — Telehealth: Payer: Self-pay

## 2024-01-12 ENCOUNTER — Telehealth: Payer: Self-pay | Admitting: Family

## 2024-01-12 NOTE — Telephone Encounter (Signed)
-----   Message from Nurse Marin Comment Z sent at 01/12/2024 10:23 AM EST ----- Sorry for the delay, I was off on Friday. I spoke to the patient and she didn't want to go today because she is covid positive. I told the patient you wanted her to get it done ASAP and I spoke to scheduling and they could get her in tomorrow but it looks like the patient scheduled it for March 5th instead. ----- Message ----- From: Sandford Craze, NP Sent: 01/10/2024   5:42 PM EST To: Elvera Maria, NT  Hi laila,  I ordered an MRI Friday for her lumbar spine.  I would like to get this done asap please.  Can you let me know when you get it scheduled ?  Ok to sent to a different location if needed. Tks!

## 2024-01-12 NOTE — Telephone Encounter (Addendum)
 Can you please contact the patient and let her know that I would really like for her to get her MRI done as soon as possible and give her the number for imaging so she can hopefully move up her appointment sooner?   Also, please let her know that her urine culture is negative for infection. Tks!

## 2024-01-12 NOTE — Telephone Encounter (Signed)
 Copied from CRM [id number]. Topic: General - Other >> Jan 12, 2024  1:53 PM Almira Coaster wrote: Reason for CRM: Patient is calling to speak with Windell Moulding regarding MRI, per patient she spoke with the imaging department and they advised that it's a [place] for a 10 day wait time and retest required for covid before moving MRI .

## 2024-01-12 NOTE — Telephone Encounter (Signed)
 Per radiology they do have a protocol for patients with covid but they can move her study to this Sunday. Phone number given to patient to call and move appointment.

## 2024-01-13 ENCOUNTER — Telehealth: Payer: Self-pay | Admitting: Neurology

## 2024-01-13 NOTE — Telephone Encounter (Signed)
 Tomorrow is day 10 of her covid illness.  I would like her to do a home test tomorrow and call us with results please. They sell kits cheaper downstairs then at other pharmacies.

## 2024-01-13 NOTE — Telephone Encounter (Signed)
 FYI   Copied from CRM [id number]. Topic: General - Other >> Jan 13, 2024  2:23 PM Adele Barthel wrote: Reason for CRM: Patient was calling to let provider know she was able to take in home COVID test and reports negative results.  CB# [phone]

## 2024-01-13 NOTE — Telephone Encounter (Signed)
 Appointment moved to Sunday 01/18/24. Patient aware of appointment date and time

## 2024-01-13 NOTE — Telephone Encounter (Signed)
 Can you please help get MRI scheduled for Sunday?

## 2024-01-13 NOTE — Telephone Encounter (Signed)
 Patient advised of getting covid at home test and let us know the results.

## 2024-01-13 NOTE — Telephone Encounter (Signed)
 Patient was scheduled to come in at 8 am Sunday. Patient aware of appointment date and time

## 2024-01-18 ENCOUNTER — Ambulatory Visit (HOSPITAL_BASED_OUTPATIENT_CLINIC_OR_DEPARTMENT_OTHER)
Admission: RE | Admit: 2024-01-18 | Discharge: 2024-01-18 | Disposition: A | Discharge status: Home / Self Care | Payer: Medicare Other | Source: Ambulatory Visit | Attending: Family | Admitting: Family

## 2024-01-18 DIAGNOSIS — M47816 Spondylosis without myelopathy or radiculopathy, lumbar region: Secondary | ICD-10-CM | POA: Diagnosis not present

## 2024-01-18 DIAGNOSIS — R29898 Other symptoms and signs involving the musculoskeletal system: Secondary | ICD-10-CM | POA: Diagnosis not present

## 2024-01-18 DIAGNOSIS — M4807 Spinal stenosis, lumbosacral region: Secondary | ICD-10-CM | POA: Diagnosis not present

## 2024-01-18 DIAGNOSIS — M48061 Spinal stenosis, lumbar region without neurogenic claudication: Secondary | ICD-10-CM | POA: Diagnosis not present

## 2024-01-18 DIAGNOSIS — M5126 Other intervertebral disc displacement, lumbar region: Secondary | ICD-10-CM | POA: Diagnosis not present

## 2024-01-19 ENCOUNTER — Telehealth: Payer: Self-pay | Admitting: Family

## 2024-01-19 DIAGNOSIS — M545 Low back pain, unspecified: Secondary | ICD-10-CM

## 2024-01-19 NOTE — Telephone Encounter (Signed)
 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 I would like to refer her to a spine specialist.

## 2024-01-19 NOTE — Telephone Encounter (Signed)
 Patient notified of results and referral.   She verbalized understanding.

## 2024-01-22 ENCOUNTER — Other Ambulatory Visit: Payer: Self-pay | Admitting: Family

## 2024-01-28 ENCOUNTER — Ambulatory Visit (HOSPITAL_BASED_OUTPATIENT_CLINIC_OR_DEPARTMENT_OTHER): Payer: Medicare Other

## 2024-03-02 DIAGNOSIS — M48062 Spinal stenosis, lumbar region with neurogenic claudication: Secondary | ICD-10-CM | POA: Diagnosis not present

## 2024-03-02 DIAGNOSIS — M47816 Spondylosis without myelopathy or radiculopathy, lumbar region: Secondary | ICD-10-CM | POA: Diagnosis not present

## 2024-03-22 DIAGNOSIS — Z79891 Long term (current) use of opiate analgesic: Secondary | ICD-10-CM | POA: Diagnosis not present

## 2024-03-22 DIAGNOSIS — M5416 Radiculopathy, lumbar region: Secondary | ICD-10-CM | POA: Diagnosis not present

## 2024-03-22 DIAGNOSIS — R296 Repeated falls: Secondary | ICD-10-CM | POA: Diagnosis not present

## 2024-03-22 DIAGNOSIS — M48061 Spinal stenosis, lumbar region without neurogenic claudication: Secondary | ICD-10-CM | POA: Diagnosis not present

## 2024-03-22 DIAGNOSIS — G894 Chronic pain syndrome: Secondary | ICD-10-CM | POA: Diagnosis not present

## 2024-03-30 DIAGNOSIS — Z96641 Presence of right artificial hip joint: Secondary | ICD-10-CM | POA: Diagnosis not present

## 2024-04-12 ENCOUNTER — Other Ambulatory Visit: Payer: Self-pay | Admitting: Family

## 2024-04-26 ENCOUNTER — Ambulatory Visit: Payer: Self-pay

## 2024-04-26 NOTE — Telephone Encounter (Signed)
 Chief Complaint: Left breast tenderness, nipple discharge Symptoms: see above Frequency: since yesterday Pertinent Negatives: Patient denies fever, rash Disposition: [] ED /[] Urgent Care (no appt availability in office) / [x] Appointment(In office/virtual)/ []  [facility]/ [] Home Care/ [] Refused Recommended Disposition /[] [facility]/ []  Follow-up with PCP Additional Notes: Patient called to book appt to have her left breast evaluated. Patient states she had a cyst removed in 2018 and sometimes gets infections in this breast. She presented with these same symptoms a year ago and was placed on antibiotics by her PCP. Patient is having left breast soreness and white nipple discharge of her left breast. Patient denies fever, rash, changes in skin. Patient appt for tomorrow for earliest availability in office to be evaluated.    Copied from CRM [id number]. Topic: Clinical - Red Word Triage >> Apr 26, 2024 10:00 AM Stanly Early wrote: breats soreness/swelling infection Reason for Disposition  [1] Breast looks infected (spreading redness, feels hot or painful to touch) AND [2] no fever  Answer Assessment - Initial Assessment Questions 1. SYMPTOM: "What's the main symptom you're concerned about?"  (e.g., lump, pain, rash, nipple discharge)     White discharge from nipple, breast soreness,  2. LOCATION: "Where is the symptoms located?"     Left breast 3. ONSET: "When did symptoms  start?"     Yesterday  4. PRIOR HISTORY: "Do you have any history of prior problems with your breasts?" (e.g., lumps, cancer, fibrocystic breast disease)     Prior history of cyst removal in 2018 - history of infection in this nipple/breast 5. CAUSE: "What do you think is causing this symptom?"     See above 6. OTHER SYMPTOMS: "Do you have any other symptoms?" (e.g., fever, breast pain, redness or rash, nipple discharge)     Breast pain, nipple discharge  Protocols used: Breast Symptoms-A-AH

## 2024-04-27 ENCOUNTER — Encounter: Payer: Self-pay | Admitting: Family Medicine

## 2024-04-27 ENCOUNTER — Ambulatory Visit (INDEPENDENT_AMBULATORY_CARE_PROVIDER_SITE_OTHER): Admitting: Family Medicine

## 2024-04-27 ENCOUNTER — Other Ambulatory Visit (HOSPITAL_BASED_OUTPATIENT_CLINIC_OR_DEPARTMENT_OTHER): Payer: Self-pay

## 2024-04-27 VITALS — BP 132/63 | HR 70 | Ht 65.0 in | Wt 173.0 lb

## 2024-04-27 DIAGNOSIS — N6452 Nipple discharge: Secondary | ICD-10-CM | POA: Diagnosis not present

## 2024-04-27 MED ORDER — AMOXICILLIN-POT CLAVULANATE 875-125 MG PO TABS
1.0000 | ORAL_TABLET | Freq: Two times a day (BID) | ORAL | 0 refills | Status: DC
  Filled 2024-04-27: qty 20, 10d supply, fill #0

## 2024-04-27 NOTE — Progress Notes (Signed)
   Acute Office Visit  Subjective:     Patient ID: NIHAL DOAN, female    DOB: 1956-09-06, 68 y.o.   MRN: [id number]  Chief Complaint  Patient presents with   Breast Problem    HPI Patient is in today for left nipple drainage.  Discussed the use of AI scribe software for clinical note transcription with the patient, who gave verbal consent to proceed.  History of Present Illness LEILIANA FOODY is a 68 year old female with recurrent breast inflammation and nipple drainage.  She has a recurrent lesion on her left nipple, similar to previous episodes in the same location. The lesion flared up again two days ago, with swelling and a milky, odorous discharge from the left nipple. The discharge occurs when she squeezes the area and is primarily from the nipple rather than the areola.  She has been managing the symptoms with warm compresses, which have provided some relief. She received antibiotics in April 2024 for a similar episode, which resolved the symptoms without any side effects.  Her last mammogram was in November, and it was normal. No other lumps or bumps in the breast.         ROS All review of systems negative except what is listed in the HPI      Objective:    BP 132/63   Pulse 70   Ht 5\' 5"  (1.651 m)   Wt 173 lb (78.5 kg)   LMP 11/25/1994   SpO2 95%   BMI 28.79 kg/m    Physical Exam Vitals reviewed.  Constitutional:      Appearance: Normal appearance.  Chest:  Breasts:    Left: Nipple discharge present.       Comments: No palpable nodules other than small area of induration (previous cyst workup) areola 10:00 (red marks above) Neurological:     Mental Status: She is alert and oriented to person, place, and time.  Psychiatric:        Mood and Affect: Mood normal.        Behavior: Behavior normal.        Thought Content: Thought content normal.        Judgment: Judgment normal.     No results found for any visits on 04/27/24.       Assessment & Plan:   Problem List Items Addressed This Visit       Active Problems   Breast discharge - Primary   Relevant Medications   amoxicillin -clavulanate (AUGMENTIN ) 875-125 MG tablet    Assessment & Plan Recurrent breast inflammation/drainage Recurrent inflammation, subareolar, with milky, odorous discharge. Previous workup benign. Recent mammogram normal - due again in November. - Prescribed Augmentin  - worked well last time. - Advised warm compresses to affected area. - Instructed to take antibiotics with food and water . - Advised to report if no improvement. - Routine mammogram due November 2025.      Meds ordered this encounter  Medications   amoxicillin -clavulanate (AUGMENTIN ) 875-125 MG tablet    Sig: Take 1 tablet by mouth 2 (two) times daily.    Dispense:  20 tablet    Refill:  0    Supervising Provider:   Randie Bustle A [[id number]]    Return if symptoms worsen or fail to improve.  Everlina Hock, NP

## 2024-06-03 ENCOUNTER — Ambulatory Visit: Payer: Medicare Other

## 2024-06-08 ENCOUNTER — Ambulatory Visit

## 2024-07-11 ENCOUNTER — Other Ambulatory Visit: Payer: Self-pay | Admitting: Family

## 2024-07-12 ENCOUNTER — Other Ambulatory Visit: Payer: Self-pay | Admitting: Family

## 2024-07-12 DIAGNOSIS — M545 Low back pain, unspecified: Secondary | ICD-10-CM

## 2024-07-12 NOTE — Telephone Encounter (Signed)
 Please call patient to schedule follow up visit.

## 2024-07-19 NOTE — Telephone Encounter (Signed)
 Patient was scheduled to come in 8/29

## 2024-07-20 DIAGNOSIS — F17200 Nicotine dependence, unspecified, uncomplicated: Secondary | ICD-10-CM | POA: Diagnosis not present

## 2024-07-20 DIAGNOSIS — G894 Chronic pain syndrome: Secondary | ICD-10-CM | POA: Diagnosis not present

## 2024-07-20 DIAGNOSIS — M5416 Radiculopathy, lumbar region: Secondary | ICD-10-CM | POA: Diagnosis not present

## 2024-07-20 DIAGNOSIS — M48061 Spinal stenosis, lumbar region without neurogenic claudication: Secondary | ICD-10-CM | POA: Diagnosis not present

## 2024-07-23 ENCOUNTER — Ambulatory Visit (INDEPENDENT_AMBULATORY_CARE_PROVIDER_SITE_OTHER): Admitting: Family

## 2024-07-23 ENCOUNTER — Other Ambulatory Visit: Payer: Self-pay

## 2024-07-23 ENCOUNTER — Encounter: Payer: Self-pay | Admitting: Family

## 2024-07-23 VITALS — BP 129/63 | HR 71 | Temp 98.7°F | Resp 16 | Ht 65.0 in | Wt 174.0 lb

## 2024-07-23 DIAGNOSIS — Z720 Tobacco use: Secondary | ICD-10-CM | POA: Diagnosis not present

## 2024-07-23 DIAGNOSIS — I10 Essential (primary) hypertension: Secondary | ICD-10-CM | POA: Diagnosis not present

## 2024-07-23 DIAGNOSIS — Z9181 History of falling: Secondary | ICD-10-CM | POA: Diagnosis not present

## 2024-07-23 DIAGNOSIS — K581 Irritable bowel syndrome with constipation: Secondary | ICD-10-CM

## 2024-07-23 DIAGNOSIS — E785 Hyperlipidemia, unspecified: Secondary | ICD-10-CM | POA: Diagnosis not present

## 2024-07-23 DIAGNOSIS — K219 Gastro-esophageal reflux disease without esophagitis: Secondary | ICD-10-CM | POA: Diagnosis not present

## 2024-07-23 DIAGNOSIS — E119 Type 2 diabetes mellitus without complications: Secondary | ICD-10-CM | POA: Diagnosis not present

## 2024-07-23 LAB — COMPREHENSIVE METABOLIC PANEL WITH GFR
ALT: 9 U/L (ref 0–35)
AST: 12 U/L (ref 0–37)
Albumin: 3.9 g/dL (ref 3.5–5.2)
Alkaline Phosphatase: 90 U/L (ref 39–117)
BUN: 12 mg/dL (ref 6–23)
CO2: 28 meq/L (ref 19–32)
Calcium: 9.2 mg/dL (ref 8.4–10.5)
Chloride: 105 meq/L (ref 96–112)
Creatinine, Ser: 0.82 mg/dL (ref 0.40–1.20)
GFR: 72.71 mL/min (ref >60.00)
Glucose, Bld: 80 mg/dL (ref 70–99)
Potassium: 4.2 meq/L (ref 3.5–5.1)
Sodium: 143 meq/L (ref 135–145)
Total Bilirubin: 0.3 mg/dL (ref 0.2–1.2)
Total Protein: 6.8 g/dL (ref 6.0–8.3)

## 2024-07-23 LAB — HEMOGLOBIN A1C: Hgb A1c MFr Bld: 6.4 % (ref 4.6–6.5)

## 2024-07-23 LAB — LIPID PANEL
Cholesterol: 224 mg/dL — ABNORMAL HIGH (ref 0–200)
HDL: 33.7 mg/dL — ABNORMAL LOW (ref >39.00)
LDL Cholesterol: 150 mg/dL — ABNORMAL HIGH (ref 0–99)
NonHDL: 190.13
Total CHOL/HDL Ratio: 7
Triglycerides: 201 mg/dL — ABNORMAL HIGH (ref 0.0–149.0)
VLDL: 40.2 mg/dL — ABNORMAL HIGH (ref 0.0–40.0)

## 2024-07-23 MED ORDER — PANTOPRAZOLE SODIUM 40 MG PO TBEC: 40.0000 mg | DELAYED_RELEASE_TABLET | Freq: Every day | ORAL | Status: DC | PRN

## 2024-07-23 MED ORDER — PANTOPRAZOLE SODIUM 40 MG PO TBEC: 40.0000 mg | DELAYED_RELEASE_TABLET | Freq: Every day | ORAL | Status: DC

## 2024-07-23 NOTE — Assessment & Plan Note (Addendum)
 Lab Results  Component Value Date   CHOL 142 05/20/2022   HDL 44.90 05/20/2022   LDLCALC 78 05/20/2022   LDLDIRECT 175.0 08/10/2018   TRIG 93.0 05/20/2022   CHOLHDL 3 05/20/2022  LDL nearly at goal- continue crestor  20mg  and dietary modification.

## 2024-07-23 NOTE — Assessment & Plan Note (Addendum)
 Lab Results  Component Value Date   HGBA1C 6.0 (H) 10/31/2023   HGBA1C 5.9 (H) 04/07/2023   HGBA1C 6.5 02/20/2023   Lab Results  Component Value Date   MICROALBUR 0.4 10/31/2023   LDLCALC 78 05/20/2022   CREATININE 1.06 (H) 09/30/2023   Continue to work on diet and update A1C.

## 2024-07-23 NOTE — Progress Notes (Signed)
 Subjective:     Patient ID: Jade [name], female    DOB: 13-Apr-1956, 68 y.o.   MRN: [id number]  Chief Complaint  Patient presents with   Hypertension    Here for follow up    Diabetes    Here for follow up    HPI  Discussed the use of AI scribe software for clinical note transcription with the patient, who gave verbal consent to proceed.  History of Present Illness Jade [name] is a 68 year old female who presents for a follow-up on her medications.  She is on carvedilol , hydralazine , and losartan  for hypertension, and takes isosorbide  mononitrate 30 mg daily, managed by her cardiologist. She uses aspirin , having previously been on Xarelto  for a year post-myocardial infarction. For constipation, she uses Linzess  approximately three times a week, reduced from daily use after retiring. She is considering resuming daily use but is concerned about diarrhea and increased copay costs.  She takes pantoprazole  once daily for gastroesophageal reflux disease. Missing a dose does not immediately cause symptoms unless she does not chew her food well, which can lead to emesis the next day.  She restarted smoking about a month after her surgery and currently smokes about a pack a day. She plans to quit using nicotine patches, which have been effective in the past.  She experiences mobility issues, having fallen twice recently, and uses a cane for support. She has limited sensation in her left leg and is considering surgery next year to address these issues.  Lab Results  Component Value Date   CHOL 224 (H) 07/23/2024   HDL 33.70 (L) 07/23/2024   LDLCALC 150 (H) 07/23/2024   LDLDIRECT 175.0 08/10/2018   TRIG 201.0 (H) 07/23/2024   CHOLHDL 7 07/23/2024       Health Maintenance Due  Topic Date Due   Zoster Vaccines- Shingrix  (1 of 2) 08/06/1973   DTaP/Tdap/Td (3 - Td or Tdap) 07/14/2021   COVID-19 Vaccine (4 - 2024-25 season) 07/27/2023   Medicare Annual Wellness (AWV)   06/02/2024   INFLUENZA VACCINE  06/25/2024    Past Medical History:  Diagnosis Date   Abnormal EKG 07/16/2011   ACS (acute coronary syndrome) (HCC) 10/22/2019   Anemia    Benign microscopic hematuria    neg cystoscopy 11/12- dr. aleene   Coronary artery disease    COVID-19 01/10/2024   DJD (degenerative joint disease)    L KNEE   Family history of anesthesia complication    multiple family members with history of this   GERD (gastroesophageal reflux disease)    Hurthle cell neoplasm of thyroid     Hyperlipidemia LDL goal <70 12/27/2013   Hypertension    Hypertensive retinopathy    Malignant hyperthermia    NSTEMI (non-ST elevated myocardial infarction) (HCC) 10/22/2019   Numbness and tingling in left arm    Osteopenia     Past Surgical History:  Procedure Laterality Date   ABDOMINAL HYSTERECTOMY  1996   BREAST EXCISIONAL BIOPSY Left 01/2016   BREAST EXCISIONAL BIOPSY Left 03/2016   CARDIAC CATHETERIZATION     CERVICAL DISC SURGERY  2013   Dr. Mavis   COLONOSCOPY  2010   in [place], -normal exam   CORONARY BALLOON ANGIOPLASTY N/A 10/25/2019   Procedure: CORONARY BALLOON ANGIOPLASTY;  Surgeon: Burnard Debby LABOR, MD;  Location: [facility];  Service: Cardiovascular;  Laterality: N/A;   CORONARY STENT INTERVENTION N/A 10/22/2019   Procedure: CORONARY STENT INTERVENTION;  Surgeon: Burnard Debby LABOR,  MD;  Location: [facility];  Service: Cardiovascular;  Laterality: N/A;  mid lad    CORONARY STENT INTERVENTION N/A 10/25/2019   Procedure: CORONARY STENT INTERVENTION;  Surgeon: Burnard Debby LABOR, MD;  Location: [facility];  Service: Cardiovascular;  Laterality: N/A;   DILATION AND CURETTAGE OF UTERUS     KNEE CARTILAGE SURGERY  1999   right knee   LEFT HEART CATH AND CORONARY ANGIOGRAPHY N/A 10/22/2019   Procedure: LEFT HEART CATH AND CORONARY ANGIOGRAPHY;  Surgeon: Burnard Debby LABOR, MD;  Location: [facility];  Service: Cardiovascular;  Laterality:  N/A;   ROTATOR CUFF REPAIR Right 05/25/2013   TOTAL HIP ARTHROPLASTY Right 04/17/2023   Procedure: TOTAL HIP ARTHROPLASTY ANTERIOR APPROACH;  Surgeon: Fidel Rogue, MD;  Location: [facility];  Service: Orthopedics;  Laterality: Right;   TOTAL KNEE ARTHROPLASTY Left 01/20/2014   Procedure: LEFT TOTAL KNEE ARTHROPLASTY;  Surgeon: Reyes JAYSON Billing, MD;  Location: [facility];  Service: Orthopedics;  Laterality: Left;   TOTAL KNEE ARTHROPLASTY Right 01/19/2015   Procedure: RIGHT TOTAL KNEE ARTHROPLASTY;  Surgeon: Reyes JAYSON Billing, MD;  Location: [facility];  Service: Orthopedics;  Laterality: Right;   TUBAL LIGATION      Family History  Problem Relation Age of Onset   Hypertension Mother    Hyperlipidemia Mother    Heart disease Father        CAD; stent at age 71   Diabetes Father    Hypertension Father    Hyperlipidemia Father    Cancer Father        prostate   Dementia Father    Osteoarthritis Sister    Hypertension Sister    COPD Sister    CAD Sister        s/p stent   Edema Sister        cause unknown   Osteoporosis Sister        bad hips, bed bound, has one leg amputated   Drug abuse Sister    Dementia Paternal Grandmother    Cancer Paternal Aunt        BREAST   Breast cancer Paternal Aunt    Colon cancer Neg Hx     Social History   Socioeconomic History   Marital status: Married    Spouse name: Elsie   Number of children: 3   Years of education: Not on file   Highest education level: Not on file  Occupational History    Comment: works part-time at [employer]  Tobacco Use   Smoking status: Every Day    Current packs/day: 1.00    Average packs/day: 0.5 packs/day for 46.3 years (23.8 ttl pk-yrs)    Types: Cigarettes    Start date: 01/23/1978    Last attempt to quit: 01/24/2023   Smokeless tobacco: Never  Vaping Use   Vaping status: Never Used  Substance and Sexual Activity   Alcohol use: No    Alcohol/week: 0.0 standard drinks of alcohol   Drug use: No   Sexual activity:  Yes    Partners: Male  Other Topics Concern   Not on file  Social History Narrative   Regular exercise:  Patient was walking at work 4 times per week until hip replacement surgery 03/2023   Caffeine Use:  2 glasses of soda daily.   Married, 3 children- grown   Completed 12th grade.   Social Drivers of Corporate investment banker Strain: Low Risk  (06/03/2023)   Overall Physicist, medical  Strain (CARDIA)    Difficulty of Paying Living Expenses: Not hard at all  Food Insecurity: No Food Insecurity (06/03/2023)   Hunger Vital Sign    Worried About Running Out of Food in the Last Year: Never true    Ran Out of Food in the Last Year: Never true  Transportation Needs: No Transportation Needs (06/03/2023)   PRAPARE - Administrator, Civil Service (Medical): No    Lack of Transportation (Non-Medical): No  Physical Activity: Insufficiently Active (06/03/2023)   Exercise Vital Sign    Days of Exercise per Week: 4 days    Minutes of Exercise per Session: 10 min  Stress: No Stress Concern Present (06/03/2023)   [employer] of Occupational Health - Occupational Stress Questionnaire    Feeling of Stress : Not at all  Social Connections: Socially Integrated (06/03/2023)   Social Connection and Isolation Panel    Frequency of Communication with Friends and Family: More than three times a week    Frequency of Social Gatherings with Friends and Family: Once a week    Attends Religious Services: More than 4 times per year    Active Member of [employer] or Organizations: Yes    Attends Engineer, structural: More than 4 times per year    Marital Status: Married  Catering manager Violence: Not At Risk (06/03/2023)   Humiliation, Afraid, Rape, and Kick questionnaire    Fear of Current or Ex-Partner: No    Emotionally Abused: No    Physically Abused: No    Sexually Abused: No    Outpatient Medications Prior to Visit  Medication Sig Dispense Refill   aspirin  EC 81 MG tablet Take 81 mg by  mouth daily. Swallow whole.     carvedilol  (COREG ) 12.5 MG tablet Take 1 tablet (12.5 mg total) by mouth 2 (two) times daily with a meal. 180 tablet 1   gabapentin  (NEURONTIN ) 300 MG capsule TAKE 1 CAPSULE BY MOUTH DAILY IN THE MORNING, 1 AT NOON AND 2 AT BEDTIME. 360 capsule 0   hydrALAZINE  (APRESOLINE ) 50 MG tablet Take 50 mg by mouth 2 (two) times daily.     isosorbide  mononitrate (IMDUR ) 30 MG 24 hr tablet Take 1 tablet (30 mg total) by mouth daily. 90 tablet 1   KLOR-CON  M20 20 MEQ tablet TAKE 1 TABLET BY MOUTH TWICE A DAY 180 tablet 1   linaclotide  (LINZESS ) 145 MCG CAPS capsule Take 1 capsule (145 mcg total) by mouth daily as needed (constipation). 30 capsule 1   losartan  (COZAAR ) 100 MG tablet TAKE 1 TABLET BY MOUTH EVERY DAY 90 tablet 0   nitroGLYCERIN  (NITROSTAT ) 0.4 MG SL tablet Place 1 tablet (0.4 mg total) under the tongue every 5 (five) minutes as needed for chest pain (CP or SOB). 25 tablet 3   ondansetron  (ZOFRAN ) 4 MG tablet Take 1 tablet (4 mg total) by mouth every 8 (eight) hours as needed for nausea or vomiting. 30 tablet 0   rosuvastatin  (CRESTOR ) 20 MG tablet TAKE 1 TABLET BY MOUTH EVERY DAY 90 tablet 0   sucralfate  (CARAFATE ) 1 g tablet Take 1 tablet (1 g total) by mouth 4 (four) times daily -  with meals and at bedtime for 14 days. 56 tablet 0   amoxicillin -clavulanate (AUGMENTIN ) 875-125 MG tablet Take 1 tablet by mouth 2 (two) times daily. 20 tablet 0   methocarbamol  (ROBAXIN ) 500 MG tablet Take 1 tablet (500 mg total) by mouth every 6 (six) hours as needed for muscle  spasms. 20 tablet 0   pantoprazole  (PROTONIX ) 40 MG tablet TAKE 1 TABLET BY MOUTH TWICE A DAY 180 tablet 0   No facility-administered medications prior to visit.    Allergies  Allergen Reactions   Shrimp [Shellfish Allergy] Itching    Rash and lips itching   Ace Inhibitors Cough    Cough   Aspirin  Nausea And Vomiting    Ok with taking coated 81mg    Azithromycin Rash    ROS    See HPI Objective:     Physical Exam Constitutional:      General: She is not in acute distress.    Appearance: Normal appearance. She is well-developed.  HENT:     Head: Normocephalic and atraumatic.     Right Ear: External ear normal.     Left Ear: External ear normal.  Eyes:     General: No scleral icterus. Neck:     Thyroid : No thyromegaly.  Cardiovascular:     Rate and Rhythm: Normal rate and regular rhythm.     Heart sounds: Normal heart sounds. No murmur heard. Pulmonary:     Effort: Pulmonary effort is normal. No respiratory distress.     Breath sounds: Normal breath sounds. No wheezing.  Musculoskeletal:     Cervical back: Neck supple.  Skin:    General: Skin is warm and dry.  Neurological:     Mental Status: She is alert and oriented to person, place, and time.  Psychiatric:        Mood and Affect: Mood normal.        Behavior: Behavior normal.        Thought Content: Thought content normal.        Judgment: Judgment normal.      BP 129/63 (BP Location: Right Arm, Patient Position: Sitting, Cuff Size: Normal)   Pulse 71   Temp 98.7 F (37.1 C) (Oral)   Resp 16   Ht 5' 5 (1.651 m)   Wt 174 lb (78.9 kg)   LMP 11/25/1994   SpO2 99%   BMI 28.96 kg/m  Wt Readings from Last 3 Encounters:  07/23/24 174 lb (78.9 kg)  04/27/24 173 lb (78.5 kg)  01/09/24 173 lb (78.5 kg)       Assessment & Plan:   Problem List Items Addressed This Visit       Unprioritized   Tobacco abuse    Current smoker, desires to quit. - She plans to start nicotine patches for cessation.      IBS (irritable bowel syndrome) - Primary    Managed with Linzess , used three times weekly. - Consider daily Linzess  if tolerated, monitor for diarrhea. - Discussed high copay concerns.      Relevant Medications   pantoprazole  (PROTONIX ) 40 MG tablet   Hypertension   BP Readings from Last 3 Encounters:  07/23/24 129/63  04/27/24 132/63  01/09/24 128/81   At goal, continue carvedilol , losartan  and  hydralazine . Continue same.      Hyperlipidemia LDL goal <70   Lab Results  Component Value Date   CHOL 142 05/20/2022   HDL 44.90 05/20/2022   LDLCALC 78 05/20/2022   LDLDIRECT 175.0 08/10/2018   TRIG 93.0 05/20/2022   CHOLHDL 3 05/20/2022  LDL nearly at goal- continue crestor  20mg  and dietary modification.       GERD (gastroesophageal reflux disease)    GERD managed with pantoprazole , symptoms mild. - Reduce pantoprazole  to as-needed basis.      Relevant Medications   pantoprazole  (  PROTONIX ) 40 MG tablet   Diabetes type 2, controlled (HCC)   Lab Results  Component Value Date   HGBA1C 6.0 (H) 10/31/2023   HGBA1C 5.9 (H) 04/07/2023   HGBA1C 6.5 02/20/2023   Lab Results  Component Value Date   MICROALBUR 0.4 10/31/2023   LDLCALC 78 05/20/2022   CREATININE 1.06 (H) 09/30/2023   Continue to work on diet and update A1C.      Relevant Orders   HgB A1c (Completed)   Comp Met (CMET) (Completed)   At risk for falls    Increased fall risk due to neuropathy and balance issues. - Encouraged continued use of cane.       Other Visit Diagnoses       Dyslipidemia       Relevant Orders   Lipid panel (Completed)       I have discontinued Carnella D. Pheasant's methocarbamol , amoxicillin -clavulanate, and pantoprazole . I have also changed her pantoprazole . Additionally, I am having her maintain her carvedilol , isosorbide  mononitrate, nitroGLYCERIN , hydrALAZINE , aspirin  EC, linaclotide , sucralfate , ondansetron , Klor-Con  M20, losartan , rosuvastatin , and gabapentin .  Meds ordered this encounter  Medications   DISCONTD: pantoprazole  (PROTONIX ) 40 MG tablet    Sig: Take 1 tablet (40 mg total) by mouth daily.    Supervising Provider:   DOMENICA BLACKBIRD A [[id number]]   pantoprazole  (PROTONIX ) 40 MG tablet    Sig: Take 1 tablet (40 mg total) by mouth daily as needed.    Supervising Provider:   DOMENICA BLACKBIRD A [phone]

## 2024-07-23 NOTE — Assessment & Plan Note (Addendum)
 BP Readings from Last 3 Encounters:  07/23/24 129/63  04/27/24 132/63  01/09/24 128/81   At goal, continue carvedilol , losartan  and hydralazine . Continue sam.e.

## 2024-07-23 NOTE — Assessment & Plan Note (Addendum)
  Managed with Linzess , used three times weekly. - Consider daily Linzess  if tolerated, monitor for diarrhea. - Discussed high copay concerns.

## 2024-07-23 NOTE — Assessment & Plan Note (Addendum)
  GERD managed with pantoprazole , symptoms mild. - Reduce pantoprazole  to as-needed basis.

## 2024-07-24 ENCOUNTER — Encounter: Payer: Self-pay | Admitting: Family

## 2024-07-24 ENCOUNTER — Ambulatory Visit: Payer: Self-pay | Admitting: Family

## 2024-07-24 DIAGNOSIS — E785 Hyperlipidemia, unspecified: Secondary | ICD-10-CM

## 2024-07-24 DIAGNOSIS — Z9181 History of falling: Secondary | ICD-10-CM | POA: Insufficient documentation

## 2024-07-24 MED ORDER — ROSUVASTATIN CALCIUM 40 MG PO TABS
40.0000 mg | ORAL_TABLET | Freq: Every day | ORAL | 1 refills | Status: DC
  Filled 2024-07-24: qty 90, 90d supply, fill #0

## 2024-07-24 NOTE — Telephone Encounter (Signed)
 Cholesterol above goal.  Please increase crestor  to 40mg  once daily.   A1C has risen from 6.0 to 6.4.  Almost in diabetes range.  Please continue to work on healthy diet, exercise.

## 2024-07-24 NOTE — Patient Instructions (Signed)
 VISIT SUMMARY:  Today, we reviewed your current medications and discussed several health concerns, including your peripheral neuropathy, fall risk, coronary artery disease, hypertension, smoking, chronic constipation, and GERD.  YOUR PLAN:  PERIPHERAL NEUROPATHY, LEFT LEG: You have reduced sensation and balance difficulties in your left leg. -Consider surgery next year to address these issues.  FALL RISK, RECURRENT FALLS: You have an increased risk of falling due to neuropathy and balance issues. -Continue using your cane for support.  CORONARY ARTERY DISEASE, STATUS POST MYOCARDIAL INFARCTION: You are managing your condition with aspirin  and isosorbide  mononitrate as prescribed by your cardiologist.  HYPERTENSION: Your blood pressure is controlled with carvedilol , hydralazine , and losartan .  NICOTINE DEPENDENCE, CURRENT SMOKER: You are currently smoking but want to quit. -Start using nicotine patches to help you quit smoking.  CHRONIC CONSTIPATION: You are managing your constipation with Linzess , used three times weekly. -Consider taking Linzess  daily if you can tolerate it. Monitor for diarrhea. -We discussed your concerns about the high copay costs.  GASTROESOPHAGEAL REFLUX DISEASE (GERD): Your GERD is managed with pantoprazole , and your symptoms are mild. -Reduce pantoprazole  to an as-needed basis.

## 2024-07-24 NOTE — Assessment & Plan Note (Signed)
  Current smoker, desires to quit. - She plans to start nicotine patches for cessation.

## 2024-07-24 NOTE — Assessment & Plan Note (Signed)
  Increased fall risk due to neuropathy and balance issues. - Encouraged continued use of cane.

## 2024-07-25 ENCOUNTER — Other Ambulatory Visit (HOSPITAL_BASED_OUTPATIENT_CLINIC_OR_DEPARTMENT_OTHER): Payer: Self-pay

## 2024-07-26 ENCOUNTER — Other Ambulatory Visit (HOSPITAL_BASED_OUTPATIENT_CLINIC_OR_DEPARTMENT_OTHER): Payer: Self-pay

## 2024-07-27 ENCOUNTER — Other Ambulatory Visit: Payer: Self-pay

## 2024-07-27 ENCOUNTER — Other Ambulatory Visit (HOSPITAL_BASED_OUTPATIENT_CLINIC_OR_DEPARTMENT_OTHER): Payer: Self-pay

## 2024-07-27 DIAGNOSIS — E785 Hyperlipidemia, unspecified: Secondary | ICD-10-CM

## 2024-07-27 MED ORDER — ROSUVASTATIN CALCIUM 40 MG PO TABS: 40.0000 mg | ORAL_TABLET | Freq: Every day | ORAL | 1 refills | Status: DC

## 2024-07-28 ENCOUNTER — Ambulatory Visit

## 2024-08-03 ENCOUNTER — Ambulatory Visit (INDEPENDENT_AMBULATORY_CARE_PROVIDER_SITE_OTHER): Admitting: *Deleted

## 2024-08-03 VITALS — Ht 65.0 in | Wt 174.0 lb

## 2024-08-03 DIAGNOSIS — Z0000 Encounter for general adult medical examination without abnormal findings: Secondary | ICD-10-CM | POA: Diagnosis not present

## 2024-08-03 DIAGNOSIS — Z1231 Encounter for screening mammogram for malignant neoplasm of breast: Secondary | ICD-10-CM

## 2024-08-03 NOTE — Progress Notes (Signed)
 Please attest this visit in the absence of patient primary care provider.    Subjective:   Jade [name] is a 68 y.o. who presents for a Medicare Wellness preventive visit.  As a reminder, Annual Wellness Visits don't include a physical exam, and some assessments may be limited, especially if this visit is performed virtually. We may recommend an in-person follow-up visit with your provider if needed.  Visit Complete: Virtual I connected with  Jade [name] on 08/03/24 by a audio enabled telemedicine application and verified that I am speaking with the correct person using two identifiers.  Patient Location: Home  Provider Location: Office/Clinic  I discussed the limitations of evaluation and management by telemedicine. The patient expressed understanding and agreed to proceed.  Vital Signs: Because this visit was a virtual/telehealth visit, some criteria may be missing or patient reported. Any vitals not documented were not able to be obtained and vitals that have been documented are patient reported.  VideoDeclined- This patient declined Librarian, academic. Therefore the visit was completed with audio only.  Persons Participating in Visit: Patient.  AWV Questionnaire: No: Patient Medicare AWV questionnaire was not completed prior to this visit.  Cardiac Risk Factors include: advanced age (>60men, >43 women);hypertension;dyslipidemia;diabetes mellitus;Other (see comment), Risk factor comments: CAD, ischemic cardiomyopathy     Objective:    Today's Vitals   08/03/24 1102  Weight: 174 lb (78.9 kg)  Height: 5' 5 (1.651 m)   Body mass index is 28.96 kg/m.     08/03/2024   11:25 AM 09/30/2023   10:43 AM 06/03/2023   11:52 AM 04/17/2023    7:39 AM 04/07/2023   11:12 AM 09/04/2022    8:50 AM 09/06/2021    9:02 AM  Advanced Directives  Does Patient Have a Medical Advance Directive? No No No No No No No  Would patient like information on creating  a medical advance directive? Yes (MAU/Ambulatory/Procedural Areas - Information given)  Yes (MAU/Ambulatory/Procedural Areas - Information given) No - Patient declined No - Patient declined No - Patient declined Yes (MAU/Ambulatory/Procedural Areas - Information given)    Current Medications (verified) Outpatient Encounter Medications as of 08/03/2024  Medication Sig   aspirin  EC 81 MG tablet Take 81 mg by mouth daily. Swallow whole.   carvedilol  (COREG ) 12.5 MG tablet Take 1 tablet (12.5 mg total) by mouth 2 (two) times daily with a meal.   gabapentin  (NEURONTIN ) 300 MG capsule TAKE 1 CAPSULE BY MOUTH DAILY IN THE MORNING, 1 AT NOON AND 2 AT BEDTIME.   hydrALAZINE  (APRESOLINE ) 50 MG tablet Take 50 mg by mouth 2 (two) times daily.   isosorbide  mononitrate (IMDUR ) 30 MG 24 hr tablet Take 1 tablet (30 mg total) by mouth daily.   KLOR-CON  M20 20 MEQ tablet TAKE 1 TABLET BY MOUTH TWICE A DAY   linaclotide  (LINZESS ) 145 MCG CAPS capsule Take 1 capsule (145 mcg total) by mouth daily as needed (constipation).   losartan  (COZAAR ) 100 MG tablet TAKE 1 TABLET BY MOUTH EVERY DAY   nitroGLYCERIN  (NITROSTAT ) 0.4 MG SL tablet Place 1 tablet (0.4 mg total) under the tongue every 5 (five) minutes as needed for chest pain (CP or SOB).   ondansetron  (ZOFRAN ) 4 MG tablet Take 1 tablet (4 mg total) by mouth every 8 (eight) hours as needed for nausea or vomiting.   pantoprazole  (PROTONIX ) 40 MG tablet Take 1 tablet (40 mg total) by mouth daily as needed.   rosuvastatin  (CRESTOR ) 40 MG tablet  Take 1 tablet (40 mg total) by mouth daily.   sucralfate  (CARAFATE ) 1 g tablet Take 1 tablet (1 g total) by mouth 4 (four) times daily -  with meals and at bedtime for 14 days.   No facility-administered encounter medications on file as of 08/03/2024.    Allergies (verified) Shrimp [shellfish allergy], Ace inhibitors, Aspirin , and Azithromycin   History: Past Medical History:  Diagnosis Date   Abnormal EKG 07/16/2011   ACS  (acute coronary syndrome) (HCC) 10/22/2019   Anemia    Benign microscopic hematuria    neg cystoscopy 11/12- dr. aleene   Coronary artery disease    COVID-19 01/10/2024   DJD (degenerative joint disease)    L KNEE   Family history of anesthesia complication    multiple family members with history of this   GERD (gastroesophageal reflux disease)    Hurthle cell neoplasm of thyroid     Hyperlipidemia LDL goal <70 12/27/2013   Hypertension    Hypertensive retinopathy    Malignant hyperthermia    NSTEMI (non-ST elevated myocardial infarction) (HCC) 10/22/2019   Numbness and tingling in left arm    Osteopenia    Past Surgical History:  Procedure Laterality Date   ABDOMINAL HYSTERECTOMY  1996   BREAST EXCISIONAL BIOPSY Left 01/2016   BREAST EXCISIONAL BIOPSY Left 03/2016   CARDIAC CATHETERIZATION     CERVICAL DISC SURGERY  2013   Dr. Mavis   COLONOSCOPY  2010   in [place], [place]normal exam   CORONARY BALLOON ANGIOPLASTY N/A 10/25/2019   Procedure: CORONARY BALLOON ANGIOPLASTY;  Surgeon: Burnard Debby LABOR, MD;  Location: [facility];  Service: Cardiovascular;  Laterality: N/A;   CORONARY STENT INTERVENTION N/A 10/22/2019   Procedure: CORONARY STENT INTERVENTION;  Surgeon: Burnard Debby LABOR, MD;  Location: [facility];  Service: Cardiovascular;  Laterality: N/A;  mid lad    CORONARY STENT INTERVENTION N/A 10/25/2019   Procedure: CORONARY STENT INTERVENTION;  Surgeon: Burnard Debby LABOR, MD;  Location: [facility];  Service: Cardiovascular;  Laterality: N/A;   DILATION AND CURETTAGE OF UTERUS     KNEE CARTILAGE SURGERY  1999   right knee   LEFT HEART CATH AND CORONARY ANGIOGRAPHY N/A 10/22/2019   Procedure: LEFT HEART CATH AND CORONARY ANGIOGRAPHY;  Surgeon: Burnard Debby LABOR, MD;  Location: [facility];  Service: Cardiovascular;  Laterality: N/A;   ROTATOR CUFF REPAIR Right 05/25/2013   TOTAL HIP ARTHROPLASTY Right 04/17/2023   Procedure: TOTAL HIP ARTHROPLASTY  ANTERIOR APPROACH;  Surgeon: Fidel Rogue, MD;  Location: [facility];  Service: Orthopedics;  Laterality: Right;   TOTAL KNEE ARTHROPLASTY Left 01/20/2014   Procedure: LEFT TOTAL KNEE ARTHROPLASTY;  Surgeon: Reyes JAYSON Billing, MD;  Location: [facility];  Service: Orthopedics;  Laterality: Left;   TOTAL KNEE ARTHROPLASTY Right 01/19/2015   Procedure: RIGHT TOTAL KNEE ARTHROPLASTY;  Surgeon: Reyes JAYSON Billing, MD;  Location: [facility];  Service: Orthopedics;  Laterality: Right;   TUBAL LIGATION     Family History  Problem Relation Age of Onset   Hypertension Mother    Hyperlipidemia Mother    Heart disease Father        CAD; stent at age 36   Diabetes Father    Hypertension Father    Hyperlipidemia Father    Cancer Father        prostate   Dementia Father    Osteoarthritis Sister    Hypertension Sister    COPD Sister    CAD  Sister        s/p stent   Edema Sister        cause unknown   Osteoporosis Sister        bad hips, bed bound, has one leg amputated   Drug abuse Sister    Multiple sclerosis Daughter    Cancer Paternal Aunt        BREAST   Breast cancer Paternal Aunt    Dementia Paternal Grandmother    Colon cancer Neg Hx    Social History   Socioeconomic History   Marital status: Married    Spouse name: Elsie   Number of children: 3   Years of education: Not on file   Highest education level: Not on file  Occupational History    Comment: works part-time at [employer]  Tobacco Use   Smoking status: Every Day    Current packs/day: 1.00    Average packs/day: 0.5 packs/day for 46.4 years (23.9 ttl pk-yrs)    Types: Cigarettes    Start date: 01/23/1978    Last attempt to quit: 01/24/2023   Smokeless tobacco: Never  Vaping Use   Vaping status: Never Used  Substance and Sexual Activity   Alcohol use: No    Alcohol/week: 0.0 standard drinks of alcohol   Drug use: No   Sexual activity: Yes    Partners: Male  Other Topics Concern   Not on file  Social History Narrative    Regular exercise:  Patient was walking at work 4 times per week until hip replacement surgery 03/2023   Caffeine Use:  2 glasses of soda daily.   Married, 3 children- grown   Completed 12th grade.   Social Drivers of Corporate investment banker Strain: Low Risk  (08/03/2024)   Overall Financial Resource Strain (CARDIA)    Difficulty of Paying Living Expenses: Not very hard  Food Insecurity: No Food Insecurity (08/03/2024)   Hunger Vital Sign    Worried About Running Out of Food in the Last Year: Never true    Ran Out of Food in the Last Year: Never true  Transportation Needs: No Transportation Needs (08/03/2024)   PRAPARE - Administrator, Civil Service (Medical): No    Lack of Transportation (Non-Medical): No  Physical Activity: Insufficiently Active (08/03/2024)   Exercise Vital Sign    Days of Exercise per Week: 4 days    Minutes of Exercise per Session: 10 min  Stress: No Stress Concern Present (08/03/2024)   [employer] of Occupational Health - Occupational Stress Questionnaire    Feeling of Stress: Not at all  Social Connections: Socially Integrated (08/03/2024)   Social Connection and Isolation Panel    Frequency of Communication with Friends and Family: More than three times a week    Frequency of Social Gatherings with Friends and Family: Once a week    Attends Religious Services: More than 4 times per year    Active Member of [employer] or Organizations: Yes    Attends Engineer, structural: More than 4 times per year    Marital Status: Married    Tobacco Counseling Ready to quit: Not Answered Counseling given: Not Answered    Clinical Intake:  Pre-visit preparation completed: Yes  Pain : No/denies pain     BMI - recorded: 28.96 Nutritional Status: BMI 25 -29 Overweight Nutritional Risks: None Diabetes: Yes CBG done?: No Did pt. bring in CBG monitor from home?: No  Lab Results  Component Value Date  HGBA1C 6.4 07/23/2024   HGBA1C 6.0 (H)  10/31/2023   HGBA1C 5.9 (H) 04/07/2023     How often do you need to have someone help you when you read instructions, pamphlets, or other written materials from your doctor or pharmacy?: 1 - Never  Interpreter Needed?: No  Information entered by :: Halla Chopp, CMA(AAMA)   Activities of Daily Living     08/03/2024   11:14 AM  In your present state of health, do you have any difficulty performing the following activities:  Hearing? 0  Vision? 0  Difficulty concentrating or making decisions? 0  Walking or climbing stairs? 1  Comment DJD knee, bulging disc  Dressing or bathing? 0  Doing errands, shopping? 0  Preparing Food and eating ? N  Using the Toilet? N  In the past six months, have you accidently leaked urine? Y  Comment Will leak if she doesn't go as soon as the urge comes  Do you have problems with loss of bowel control? N  Managing your Medications? N  Managing your Finances? N  Housekeeping or managing your Housekeeping? N    Patient Care Team: Daryl Setter, NP as PCP - General (Internal Medicine) Cherry Abraham Bandy, NP as Nurse Practitioner (Cardiology) Fidel Rogue, MD as Consulting Physician (Orthopedic Surgery) Camillo Golas, MD as Attending Physician (Ophthalmology)  I have updated your Care Teams any recent Medical Services you may have received from other providers in the past year.     Assessment:   This is a routine wellness examination for Jade [name].  Hearing/Vision screen Hearing Screening - Comments:: Denies hearing difficulties.  Vision Screening - Comments:: Wears RX glasses -- up to date with [facility].    Goals Addressed   None    Depression Screen     07/23/2024   11:05 AM 06/03/2023   11:48 AM 05/20/2022   11:13 AM 09/06/2021    9:05 AM 08/25/2020    1:05 PM 02/08/2019   11:46 AM 11/07/2017    3:24 PM  PHQ 2/9 Scores  PHQ - 2 Score 0 0 0 0 3 0 0  PHQ- 9 Score 2 0   4 0 0   No change in depression  screen since 07/23/24.  Fall Risk     07/23/2024   11:05 AM 06/03/2023   11:53 AM 05/20/2022   11:13 AM 09/06/2021    9:04 AM 03/19/2021   10:30 AM  Fall Risk   Falls in the past year? 1 0 0 0 1  Number falls in past yr: 1 0 0 0 0  Injury with Fall? 1 0 0 0   Risk for fall due to : No Fall Risks Medication side effect;Impaired vision;Impaired balance/gait     Follow up Falls evaluation completed Falls evaluation completed;Education provided  Falls prevention discussed       Data saved with a previous flowsheet row definition  No additional falls since 07/23/24 screening.  MEDICARE RISK AT HOME:  Medicare Risk at Home Any stairs in or around the home?: No If so, are there any without handrails?: No Home free of loose throw rugs in walkways, pet beds, electrical cords, etc?: Yes Adequate lighting in your home to reduce risk of falls?: Yes Life alert?: No Use of a cane, walker or w/c?: Yes (cane) Grab bars in the bathroom?: Yes Shower chair or bench in shower?: No Elevated toilet seat or a handicapped toilet?: No  TIMED UP AND GO:  Was the  test performed?  No,audio  Cognitive Function: 6CIT completed    08/08/2015   10:47 AM  MMSE - Mini Mental State Exam  Not completed: --        08/03/2024   11:25 AM 06/03/2023   11:55 AM  6CIT Screen  What Year? 0 points 0 points  What month? 0 points 0 points  What time? 0 points 0 points  Count back from 20 0 points 2 points  Months in reverse 0 points 0 points  Repeat phrase 0 points 0 points  Total Score 0 points 2 points    Immunizations Immunization History  Administered Date(s) Administered   Fluad Quad(high Dose 65+) 10/13/2019, 08/25/2020, 10/03/2021, 09/02/2022   Fluad Trivalent(High Dose 65+) 10/31/2023   Influenza Split 10/11/2011   Influenza,inj,Quad PF,6+ Mos 08/17/2013, 08/29/2014, 08/08/2015, 10/16/2016, 08/18/2017, 08/10/2018   PFIZER(Purple Top)SARS-COV-2 Vaccination 01/01/2020, 01/22/2020, 09/22/2020    PNEUMOCOCCAL CONJUGATE-20 10/03/2021, 09/02/2022   Pneumococcal Polysaccharide-23 04/20/2013, 10/13/2019   Td 11/25/2006   Tdap 07/15/2011, 07/28/2024   Zoster Recombinant(Shingrix ) 07/28/2024   Zoster, Live 11/06/2016    Screening Tests Health Maintenance  Topic Date Due   Medicare Annual Wellness (AWV)  06/02/2024   Influenza Vaccine  06/25/2024   COVID-19 Vaccine (4 - 2025-26 season) 07/26/2024   Zoster Vaccines- Shingrix  (2 of 2) 09/22/2024   MAMMOGRAM  10/05/2024   Diabetic kidney evaluation - Urine ACR  10/30/2024   FOOT EXAM  10/30/2024   OPHTHALMOLOGY EXAM  10/30/2024   Lung Cancer Screening  11/16/2024   HEMOGLOBIN A1C  01/22/2025   Diabetic kidney evaluation - eGFR measurement  07/23/2025   DEXA SCAN  11/30/2025   Colonoscopy  11/23/2028   DTaP/Tdap/Td (4 - Td or Tdap) 07/28/2034   Pneumococcal Vaccine: 50+ Years  Completed   HPV VACCINES  Aged Out   Meningococcal B Vaccine  Aged Out   Hepatitis C Screening  Discontinued    Health Maintenance Items Addressed: Pt will complete Shingrix  series in November. Will get flu and Covid vaccines at pharmacy.  Mammogram ordered.  Additional Screening:  Vision Screening: Recommended annual ophthalmology exams for early detection of glaucoma and other disorders of the eye. Is the patient up to date with their annual eye exam?  Yes  Who is the provider or what is the name of the office in which the patient attends annual eye exams? [facility].  Dental Screening: Recommended annual dental exams for proper oral hygiene  Community Resource Referral / Chronic Care Management: CRR required this visit?  No   CCM required this visit?  No   Plan:    I have personally reviewed and noted the following in the patient's chart:   Medical and social history Use of alcohol, tobacco or illicit drugs  Current medications and supplements including opioid prescriptions. Patient is not currently taking opioid prescriptions. Functional  ability and status Nutritional status Physical activity Advanced directives List of other physicians Hospitalizations, surgeries, and ER visits in previous 12 months Vitals Screenings to include cognitive, depression, and falls Referrals and appointments  In addition, I have reviewed and discussed with patient certain preventive protocols, quality metrics, and best practice recommendations. A written personalized care plan for preventive services as well as general preventive health recommendations were provided to patient.   Lolita Libra, CMA   08/03/2024   After Visit Summary: (Mail) Due to this being a telephonic visit, the after visit summary with patients personalized plan was offered to patient via mail   Notes: Nothing significant  to report at this time.

## 2024-08-03 NOTE — Patient Instructions (Addendum)
 Jade [name] , Thank you for taking time out of your busy schedule to complete your Annual Wellness Visit with me. I enjoyed our conversation and look forward to speaking with you again next year. I, as well as your care team,  appreciate your ongoing commitment to your health goals. Please review the following plan we discussed and let me know if I can assist you in the future. Your Game plan/ To Do List   Referrals: If you haven't heard from the office you've been referred to, please reach out to them at the phone provided.   Mammogram Manufacturing engineer [place]):  [phone] Schedule after 10/05/24.  Follow up Visits: Next Medicare AWV with our clinical staff: 08/09/25 11am, telephone.  Next Office Visit with your provider: 11/23/24 11:20am, [name], NP  Clinician Recommendations:  Aim for 30 minutes of exercise or brisk walking, 6-8 glasses of water , and 5 servings of fruits and vegetables each day.   You will need to get the following vaccines at your local pharmacy: 2nd Shingrix , Flu and Covid.      This is a list of the screening recommended for you and due dates:  Health Maintenance  Topic Date Due   Medicare Annual Wellness Visit  06/02/2024   Flu Shot  06/25/2024   COVID-19 Vaccine (4 - 2025-26 season) 07/26/2024   Zoster (Shingles) Vaccine (2 of 2) 09/22/2024   Mammogram  10/05/2024   Yearly kidney health urinalysis for diabetes  10/30/2024   Complete foot exam   10/30/2024   Eye exam for diabetics  10/30/2024   Screening for Lung Cancer  11/16/2024   Hemoglobin A1C  01/22/2025   Yearly kidney function blood test for diabetes  07/23/2025   DEXA scan (bone density measurement)  11/30/2025   Colon Cancer Screening  11/23/2028   DTaP/Tdap/Td vaccine (4 - Td or Tdap) 07/28/2034   Pneumococcal Vaccine for age over 13  Completed   HPV Vaccine  Aged Out   Meningitis B Vaccine  Aged Out   Hepatitis C Screening  Discontinued   Please let us  know if you do not receive  your Advanced Directives in the mail within 1 week. Once completed and notarized, you may return a copy of your Advanced Directive(s) by either of the following:  Advanced directives: (Copy Requested) Please bring a copy of your health care power of attorney and living will to the office to be added to your chart at your convenience. You can mail to [facility] [address] [place], [place] [address] or email to [email or web address] Advance Care Planning is important because it:  [x]  Makes sure you receive the medical care that is consistent with your values, goals, and preferences  [x]  It provides guidance to your family and loved ones and reduces their decisional burden about whether or not they are making the right decisions based on your wishes.  Follow the link provided in your after visit summary or read over the paperwork we have mailed to you to help you started getting your Advance Directives in place. If you need assistance in completing these, please reach out to us  so that we can help you!  See attachments for Preventive Care and Fall Prevention Tips.

## 2024-08-19 DIAGNOSIS — M48061 Spinal stenosis, lumbar region without neurogenic claudication: Secondary | ICD-10-CM | POA: Diagnosis not present

## 2024-08-19 DIAGNOSIS — M5416 Radiculopathy, lumbar region: Secondary | ICD-10-CM | POA: Diagnosis not present

## 2024-09-07 NOTE — Progress Notes (Signed)
 Jade [name]                                          MRN: [id number]   09/07/2024   The [facility] Quality Team Specialist reviewed this patient medical record for the purposes of chart review for care gap closure. The following were reviewed: chart review for care gap closure-kidney health evaluation for diabetes:eGFR  and uACR. Appt 12/23    [facility] Quality Team

## 2024-10-11 ENCOUNTER — Ambulatory Visit (HOSPITAL_BASED_OUTPATIENT_CLINIC_OR_DEPARTMENT_OTHER)
Admission: RE | Admit: 2024-10-11 | Discharge: 2024-10-11 | Disposition: A | Discharge status: Home / Self Care | Source: Ambulatory Visit | Attending: Family | Admitting: Family

## 2024-10-11 ENCOUNTER — Encounter: Payer: Self-pay | Admitting: Family Medicine

## 2024-10-11 ENCOUNTER — Ambulatory Visit: Payer: Self-pay

## 2024-10-11 ENCOUNTER — Encounter (HOSPITAL_BASED_OUTPATIENT_CLINIC_OR_DEPARTMENT_OTHER): Payer: Self-pay

## 2024-10-11 ENCOUNTER — Ambulatory Visit: Admitting: Family Medicine

## 2024-10-11 ENCOUNTER — Other Ambulatory Visit (HOSPITAL_BASED_OUTPATIENT_CLINIC_OR_DEPARTMENT_OTHER): Payer: Self-pay

## 2024-10-11 VITALS — BP 139/81 | HR 84 | Temp 97.5°F | Ht 65.0 in | Wt 170.0 lb

## 2024-10-11 DIAGNOSIS — Z1231 Encounter for screening mammogram for malignant neoplasm of breast: Secondary | ICD-10-CM | POA: Insufficient documentation

## 2024-10-11 DIAGNOSIS — J0140 Acute pansinusitis, unspecified: Secondary | ICD-10-CM | POA: Diagnosis not present

## 2024-10-11 MED ORDER — PREDNISONE 20 MG PO TABS
20.0000 mg | ORAL_TABLET | Freq: Every day | ORAL | 0 refills | Status: AC
  Filled 2024-10-11: qty 5, 5d supply, fill #0

## 2024-10-11 MED ORDER — DOXYCYCLINE HYCLATE 100 MG PO TABS
100.0000 mg | ORAL_TABLET | Freq: Two times a day (BID) | ORAL | 0 refills | Status: AC
  Filled 2024-10-11: qty 14, 7d supply, fill #0

## 2024-10-11 NOTE — Progress Notes (Signed)
 Acute Office Visit  Subjective:     Patient ID: Jade [name], female    DOB: 1956/03/06, 68 y.o.   MRN: [id number]  Chief Complaint  Patient presents with   Cough     Patient is in today for cough, sinus pain.    Discussed the use of AI scribe software for clinical note transcription with the patient, who gave verbal consent to proceed.  History of Present Illness Jade [name] is a 68 year old female who presents with ear congestion and a productive cough.  She has been experiencing ear congestion and a sensation of fullness in her ears, sinus pressure, headaches, postnasal drainage, nasal congestion, productive cough. She has a history of similar episodes that typically require antibiotics and prednisone  for resolution. She has been using a nasal spray, which she recalls starts with an 'A,' and takes Zyrtec daily. She has also attempted to manage her symptoms with Mucinex  DM, which she switched to from a different formulation.  She has a productive cough with white sputum. No fever, nausea, vomiting, diarrhea, or hemoptysis, but she notes some wheezing. Her daughter and grandchild recently had upper respiratory infections, suggesting possible sick contacts.        All review of systems negative except what is listed in the HPI      Objective:    BP 139/81   Pulse 84   Temp (!) 97.5 F (36.4 C) (Oral)   Ht 5' 5 (1.651 m)   Wt 170 lb (77.1 kg)   LMP 11/25/1994   SpO2 100%   BMI 28.29 kg/m    Physical Exam Vitals reviewed.  Constitutional:      Appearance: Normal appearance.  HENT:     Head: Normocephalic and atraumatic.     Right Ear: Tympanic membrane normal.     Left Ear: Tympanic membrane normal.     Nose: Congestion present.  Eyes:     Conjunctiva/sclera: Conjunctivae normal.  Cardiovascular:     Rate and Rhythm: Normal rate and regular rhythm.     Heart sounds: Normal heart sounds.  Pulmonary:     Effort: Pulmonary effort is normal.      Breath sounds: Normal breath sounds. No wheezing, rhonchi or rales.  Skin:    General: Skin is warm and dry.  Neurological:     Mental Status: She is alert and oriented to person, place, and time.  Psychiatric:        Mood and Affect: Mood normal.        Behavior: Behavior normal.        Thought Content: Thought content normal.        Judgment: Judgment normal.          No results found for any visits on 10/11/24.      Assessment & Plan:   Problem List Items Addressed This Visit   None Visit Diagnoses       Acute non-recurrent pansinusitis    -  Primary   Relevant Medications   predniSONE  (DELTASONE ) 20 MG tablet   doxycycline  (VIBRA -TABS) 100 MG tablet       Assessment & Plan Sinusitis with cough - Prescribed doxycycline  with food and water  to prevent stomach upset. - Prescribed a short course of prednisone  (5-day burst) to reduce inflammation. - Advised monitoring blood pressure at home due to potential increase from prednisone . - Continue Mucinex  to help loosen and clear mucus. - Encouraged increased fluid intake, including warm liquids like chicken broth or  tea.     Meds ordered this encounter  Medications   predniSONE  (DELTASONE ) 20 MG tablet    Sig: Take 1 tablet (20 mg total) by mouth daily with breakfast for 5 days.    Dispense:  5 tablet    Refill:  0    Supervising Provider:   DOMENICA BLACKBIRD A [[id number]]   doxycycline  (VIBRA -TABS) 100 MG tablet    Sig: Take 1 tablet (100 mg total) by mouth 2 (two) times daily for 7 days.    Dispense:  14 tablet    Refill:  0    Supervising Provider:   DOMENICA BLACKBIRD A [[id number]]    Return if symptoms worsen or fail to improve.  Waddell KATHEE Mon, NP

## 2024-10-11 NOTE — Telephone Encounter (Signed)
 FYI Only or Action Required?: Action required by provider: request for appointment.  Patient was last seen in primary care on 07/23/2024 by Daryl Setter, NP.  Called Nurse Triage reporting Cough.  Symptoms began a week ago.  Interventions attempted: OTC medications:  SABRA  Symptoms are: gradually worsening. Cough worsening, wheezing, SOB.  Triage Disposition: See HCP Within 4 Hours (Or PCP Triage)  Patient/caregiver understands and will follow disposition?: Yes      Copied from CRM #[id number]. Topic: Clinical - Red Word Triage >> Oct 11, 2024  7:52 AM Victoria A wrote: [facility] that prompted transfer to Nurse Triage: Patient is having pain from coughing continuous, drainage in throat Reason for Disposition  [1] MILD difficulty breathing (e.g., minimal/no SOB at rest, SOB with walking, pulse < 100) AND [2] still present when not coughing  Answer Assessment - Initial Assessment Questions 1. ONSET: When did the cough begin?      1 week 2. SEVERITY: How bad is the cough today?      severe 3. SPUTUM: Describe the color of your sputum (e.g., none, dry cough; clear, white, yellow, green)     white 4. HEMOPTYSIS: Are you coughing up any blood? If Yes, ask: How much? (e.g., flecks, streaks, tablespoons, etc.)     no 5. DIFFICULTY BREATHING: Are you having difficulty breathing? If Yes, ask: How bad is it? (e.g., mild, moderate, severe)      With exertion 6. FEVER: Do you have a fever? If Yes, ask: What is your temperature, how was it measured, and when did it start?     no 7. CARDIAC HISTORY: Do you have any history of heart disease? (e.g., heart attack, congestive heart failure)      Heart attack 8. LUNG HISTORY: Do you have any history of lung disease?  (e.g., pulmonary embolus, asthma, emphysema)     no 9. PE RISK FACTORS: Do you have a history of blood clots? (or: recent major surgery, recent prolonged travel, bedridden)     no 10. OTHER SYMPTOMS: Do  you have any other symptoms? (e.g., runny nose, wheezing, chest pain)       wheezing 11. PREGNANCY: Is there any chance you are pregnant? When was your last menstrual period?       no 12. TRAVEL: Have you traveled out of the country in the last month? (e.g., travel history, exposures)       no  Protocols used: Cough - Acute Productive-A-AH

## 2024-10-12 ENCOUNTER — Ambulatory Visit: Payer: Self-pay | Admitting: Family

## 2024-10-17 ENCOUNTER — Other Ambulatory Visit: Payer: Self-pay | Admitting: Family

## 2024-10-17 DIAGNOSIS — K219 Gastro-esophageal reflux disease without esophagitis: Secondary | ICD-10-CM

## 2024-10-18 ENCOUNTER — Other Ambulatory Visit: Payer: Self-pay | Admitting: Family

## 2024-10-18 DIAGNOSIS — M545 Low back pain, unspecified: Secondary | ICD-10-CM

## 2024-11-16 ENCOUNTER — Ambulatory Visit: Payer: Self-pay | Admitting: Family

## 2024-11-16 ENCOUNTER — Ambulatory Visit: Admitting: Family

## 2024-11-16 VITALS — BP 138/73 | HR 80 | Temp 98.0°F | Resp 16 | Ht 65.0 in | Wt 171.0 lb

## 2024-11-16 DIAGNOSIS — E119 Type 2 diabetes mellitus without complications: Secondary | ICD-10-CM | POA: Diagnosis not present

## 2024-11-16 DIAGNOSIS — N760 Acute vaginitis: Secondary | ICD-10-CM

## 2024-11-16 DIAGNOSIS — E785 Hyperlipidemia, unspecified: Secondary | ICD-10-CM

## 2024-11-16 DIAGNOSIS — E876 Hypokalemia: Secondary | ICD-10-CM

## 2024-11-16 DIAGNOSIS — I10 Essential (primary) hypertension: Secondary | ICD-10-CM | POA: Diagnosis not present

## 2024-11-16 DIAGNOSIS — Z23 Encounter for immunization: Secondary | ICD-10-CM | POA: Diagnosis not present

## 2024-11-16 DIAGNOSIS — Z789 Other specified health status: Secondary | ICD-10-CM | POA: Diagnosis not present

## 2024-11-16 DIAGNOSIS — I2510 Atherosclerotic heart disease of native coronary artery without angina pectoris: Secondary | ICD-10-CM

## 2024-11-16 DIAGNOSIS — K219 Gastro-esophageal reflux disease without esophagitis: Secondary | ICD-10-CM | POA: Diagnosis not present

## 2024-11-16 DIAGNOSIS — Z720 Tobacco use: Secondary | ICD-10-CM

## 2024-11-16 LAB — COMPREHENSIVE METABOLIC PANEL WITH GFR
ALT: 9 U/L (ref 3–35)
AST: 12 U/L (ref 5–37)
Albumin: 4 g/dL (ref 3.5–5.2)
Alkaline Phosphatase: 92 U/L (ref 39–117)
BUN: 9 mg/dL (ref 6–23)
CO2: 26 meq/L (ref 19–32)
Calcium: 9 mg/dL (ref 8.4–10.5)
Chloride: 108 meq/L (ref 96–112)
Creatinine, Ser: 0.9 mg/dL (ref 0.40–1.20)
GFR: 64.88 mL/min
Glucose, Bld: 92 mg/dL (ref 70–99)
Potassium: 3.7 meq/L (ref 3.5–5.1)
Sodium: 141 meq/L (ref 135–145)
Total Bilirubin: 0.3 mg/dL (ref 0.2–1.2)
Total Protein: 6.6 g/dL (ref 6.0–8.3)

## 2024-11-16 LAB — MICROALBUMIN / CREATININE URINE RATIO
Creatinine,U: 217.3 mg/dL
Microalb Creat Ratio: 9 mg/g (ref 0.0–30.0)
Microalb, Ur: 1.9 mg/dL (ref 0.7–1.9)

## 2024-11-16 LAB — HEMOGLOBIN A1C: Hgb A1c MFr Bld: 5.9 % (ref 4.6–6.5)

## 2024-11-16 MED ORDER — REPATHA SURECLICK 140 MG/ML ~~LOC~~ SOAJ: 140.0000 mg | SUBCUTANEOUS | 2 refills | Status: AC

## 2024-11-16 MED ORDER — PANTOPRAZOLE SODIUM 40 MG PO TBEC: 40.0000 mg | DELAYED_RELEASE_TABLET | Freq: Every day | ORAL | Status: AC

## 2024-11-16 MED ORDER — FLUCONAZOLE 150 MG PO TABS: ORAL_TABLET | ORAL | 0 refills | Status: AC

## 2024-11-16 NOTE — Assessment & Plan Note (Signed)
 BP stable on carvedilol , hydralazine  losartan .

## 2024-11-16 NOTE — Telephone Encounter (Signed)
 Mailed to pt

## 2024-11-16 NOTE — Assessment & Plan Note (Signed)
 Counseled on importance of quitting. Update CT lung cancer screening.

## 2024-11-16 NOTE — Assessment & Plan Note (Signed)
 Stable on pantoprazole  once daily.

## 2024-11-16 NOTE — Progress Notes (Signed)
 "  Subjective:     Patient ID: Jade [name], female    DOB: 11-11-1956, 68 y.o.   MRN: [id number]  Chief Complaint  Patient presents with   Hypertension    Here for follow up   Hyperlipidemia    Here for follow up    Hypertension  Hyperlipidemia    Discussed the use of AI scribe software for clinical note transcription with the patient, who gave verbal consent to proceed.  History of Present Illness Jade [name] is a 68 year old female with hypertension and diabetes who presents for a routine follow-up visit.  She takes carvedilol  12.5 mg twice a day, isosorbide  once a day, losartan  100 mg once a day, and hydralazine . No recent episodes of chest pain or heartburn. She takes pantoprazole  once a day for heartburn.  She has diabetes with a last A1c of 6.4. She notes fluctuations in her weight, recently weighing 171 pounds. She is mindful of her diet but finds it challenging to maintain a stable weight.  She has a history of high cholesterol and experienced leg cramps with Crestor , leading to its discontinuation. She is not on statin therapy and is concerned about leg cramps.  She smokes and acknowledges smoking 'too much'. She previously quit for two years using patches and Chantix  but resumed after an injury.  She experiences low back pain with nerve pressure symptoms, including a 'funny feeling' and occasional numbness in her leg, affecting her balance and mobility. She uses a walker for support.  She has not received her flu shot this season due to a recent cold but plans to get it during this visit. She has received the shingles and RSV vaccines. She missed her eye exam but rescheduled it for January 14th.  She is retired and expresses dissatisfaction with retirement, noting a lack of daily structure and activities. Her husband continues to work at age 14, and she reflects on the benefits of having a routine.      Health Maintenance Due  Topic Date Due   Influenza  Vaccine  06/25/2024   COVID-19 Vaccine (4 - 2025-26 season) 07/26/2024   Diabetic kidney evaluation - Urine ACR  10/30/2024   Lung Cancer Screening  11/16/2024   OPHTHALMOLOGY EXAM  10/30/2024    Past Medical History:  Diagnosis Date   Abnormal EKG 07/16/2011   ACS (acute coronary syndrome) (HCC) 10/22/2019   Anemia    Benign microscopic hematuria    neg cystoscopy 11/12- dr. aleene   Coronary artery disease    COVID-19 01/10/2024   DJD (degenerative joint disease)    L KNEE   Family history of anesthesia complication    multiple family members with history of this   GERD (gastroesophageal reflux disease)    Hurthle cell neoplasm of thyroid     Hyperlipidemia LDL goal <70 12/27/2013   Hypertension    Hypertensive retinopathy    Malignant hyperthermia    NSTEMI (non-ST elevated myocardial infarction) (HCC) 10/22/2019   Numbness and tingling in left arm    Osteopenia     Past Surgical History:  Procedure Laterality Date   ABDOMINAL HYSTERECTOMY  1996   BREAST EXCISIONAL BIOPSY Left 01/2016   BREAST EXCISIONAL BIOPSY Left 03/2016   CARDIAC CATHETERIZATION     CERVICAL DISC SURGERY  2013   Dr. Mavis   COLONOSCOPY  2010   in [place], [place]-normal exam   CORONARY BALLOON ANGIOPLASTY N/A 10/25/2019   Procedure: CORONARY BALLOON ANGIOPLASTY;  Surgeon: Burnard Debby LABOR,  MD;  Location: [facility];  Service: Cardiovascular;  Laterality: N/A;   CORONARY STENT INTERVENTION N/A 10/22/2019   Procedure: CORONARY STENT INTERVENTION;  Surgeon: Burnard Debby LABOR, MD;  Location: [facility];  Service: Cardiovascular;  Laterality: N/A;  mid lad    CORONARY STENT INTERVENTION N/A 10/25/2019   Procedure: CORONARY STENT INTERVENTION;  Surgeon: Burnard Debby LABOR, MD;  Location: [facility];  Service: Cardiovascular;  Laterality: N/A;   DILATION AND CURETTAGE OF UTERUS     KNEE CARTILAGE SURGERY  1999   right knee   LEFT HEART CATH AND CORONARY ANGIOGRAPHY N/A 10/22/2019    Procedure: LEFT HEART CATH AND CORONARY ANGIOGRAPHY;  Surgeon: Burnard Debby LABOR, MD;  Location: [facility];  Service: Cardiovascular;  Laterality: N/A;   ROTATOR CUFF REPAIR Right 05/25/2013   TOTAL HIP ARTHROPLASTY Right 04/17/2023   Procedure: TOTAL HIP ARTHROPLASTY ANTERIOR APPROACH;  Surgeon: Fidel Rogue, MD;  Location: [facility];  Service: Orthopedics;  Laterality: Right;   TOTAL KNEE ARTHROPLASTY Left 01/20/2014   Procedure: LEFT TOTAL KNEE ARTHROPLASTY;  Surgeon: Reyes JAYSON Billing, MD;  Location: [facility];  Service: Orthopedics;  Laterality: Left;   TOTAL KNEE ARTHROPLASTY Right 01/19/2015   Procedure: RIGHT TOTAL KNEE ARTHROPLASTY;  Surgeon: Reyes JAYSON Billing, MD;  Location: [facility];  Service: Orthopedics;  Laterality: Right;   TUBAL LIGATION      Family History  Problem Relation Age of Onset   Hypertension Mother    Hyperlipidemia Mother    Heart disease Father        CAD; stent at age 44   Diabetes Father    Hypertension Father    Hyperlipidemia Father    Cancer Father        prostate   Dementia Father    Osteoarthritis Sister    Hypertension Sister    COPD Sister    CAD Sister        s/p stent   Edema Sister        cause unknown   Osteoporosis Sister        bad hips, bed bound, has one leg amputated   Drug abuse Sister    Multiple sclerosis Daughter    Cancer Paternal Aunt        BREAST   Breast cancer Paternal Aunt    Dementia Paternal Grandmother    Colon cancer Neg Hx     Social History   Socioeconomic History   Marital status: Married    Spouse name: Elsie   Number of children: 3   Years of education: Not on file   Highest education level: Not on file  Occupational History    Comment: works part-time at [employer]  Tobacco Use   Smoking status: Every Day    Current packs/day: 1.00    Average packs/day: 0.5 packs/day for 46.6 years (24.1 ttl pk-yrs)    Types: Cigarettes    Start date: 01/23/1978    Last attempt to quit: 01/24/2023   Smokeless tobacco:  Never  Vaping Use   Vaping status: Never Used  Substance and Sexual Activity   Alcohol use: No    Alcohol/week: 0.0 standard drinks of alcohol   Drug use: No   Sexual activity: Yes    Partners: Male  Other Topics Concern   Not on file  Social History Narrative   Regular exercise:  Patient was walking at work 4 times per week until hip replacement surgery 03/2023   Caffeine Use:  2 glasses of soda daily.   Married, 3 children- grown   Completed 12th grade.   Social Drivers of Health   Tobacco Use: High Risk (10/11/2024)   Patient History    Smoking Tobacco Use: Every Day    Smokeless Tobacco Use: Never    Passive Exposure: Not on file  Financial Resource Strain: Low Risk (08/03/2024)   Overall Financial Resource Strain (CARDIA)    Difficulty of Paying Living Expenses: Not very hard  Food Insecurity: No Food Insecurity (08/03/2024)   Epic    Worried About Programme Researcher, Broadcasting/film/video in the Last Year: Never true    Ran Out of Food in the Last Year: Never true  Transportation Needs: No Transportation Needs (08/03/2024)   Epic    Lack of Transportation (Medical): No    Lack of Transportation (Non-Medical): No  Physical Activity: Insufficiently Active (08/03/2024)   Exercise Vital Sign    Days of Exercise per Week: 4 days    Minutes of Exercise per Session: 10 min  Stress: No Stress Concern Present (08/03/2024)   [employer] of Occupational Health - Occupational Stress Questionnaire    Feeling of Stress: Not at all  Social Connections: Socially Integrated (08/03/2024)   Social Connection and Isolation Panel    Frequency of Communication with Friends and Family: More than three times a week    Frequency of Social Gatherings with Friends and Family: Once a week    Attends Religious Services: More than 4 times per year    Active Member of Clubs or Organizations: Yes    Attends Banker Meetings: More than 4 times per year    Marital Status: Married  Catering Manager Violence:  Not At Risk (08/03/2024)   Epic    Fear of Current or Ex-Partner: No    Emotionally Abused: No    Physically Abused: No    Sexually Abused: No  Depression (PHQ2-9): Low Risk (11/16/2024)   Depression (PHQ2-9)    PHQ-2 Score: 2  Alcohol Screen: Low Risk (08/03/2024)   Alcohol Screen    Last Alcohol Screening Score (AUDIT): 0  Housing: Low Risk (08/03/2024)   Epic    Unable to Pay for Housing in the Last Year: No    Number of Times Moved in the Last Year: 0    Homeless in the Last Year: No  Utilities: Not At Risk (08/03/2024)   Epic    Threatened with loss of utilities: No  Health Literacy: Adequate Health Literacy (08/03/2024)   B1300 Health Literacy    Frequency of need for help with medical instructions: Never    Outpatient Medications Prior to Visit  Medication Sig Dispense Refill   aspirin  EC 81 MG tablet Take 81 mg by mouth daily. Swallow whole.     carvedilol  (COREG ) 12.5 MG tablet Take 1 tablet (12.5 mg total) by mouth 2 (two) times daily with a meal. 180 tablet 1   gabapentin  (NEURONTIN ) 300 MG capsule Take 1 capsule (300 mg total) by mouth in the morning AND 1 capsule (300 mg total) daily at 12 noon AND 2 capsules (600 mg total) at bedtime. 360 capsule 0   hydrALAZINE  (APRESOLINE ) 50 MG tablet Take 50 mg by mouth 2 (two) times daily.     isosorbide  mononitrate (IMDUR ) 30 MG 24 hr tablet Take 1 tablet (30 mg total) by mouth daily. 90 tablet 1   linaclotide  (LINZESS ) 145 MCG CAPS capsule Take 1 capsule (145 mcg total) by mouth daily as needed (constipation).  30 capsule 1   losartan  (COZAAR ) 100 MG tablet Take 1 tablet (100 mg total) by mouth daily. 90 tablet 1   nitroGLYCERIN  (NITROSTAT ) 0.4 MG SL tablet Place 1 tablet (0.4 mg total) under the tongue every 5 (five) minutes as needed for chest pain (CP or SOB). 25 tablet 3   potassium chloride  SA (KLOR-CON  M) 20 MEQ tablet Take 1 tablet (20 mEq total) by mouth 2 (two) times daily. 180 tablet 1   sucralfate  (CARAFATE ) 1 g tablet Take 1  tablet (1 g total) by mouth 4 (four) times daily -  with meals and at bedtime for 14 days. 56 tablet 0   pantoprazole  (PROTONIX ) 40 MG tablet Take 1 tablet (40 mg total) by mouth 2 (two) times daily. 180 tablet 1   rosuvastatin  (CRESTOR ) 40 MG tablet Take 1 tablet (40 mg total) by mouth daily. 90 tablet 1   No facility-administered medications prior to visit.    Allergies[1]  ROS    See HPI Objective:    Physical Exam Constitutional:      General: She is not in acute distress.    Appearance: Normal appearance. She is well-developed.  HENT:     Head: Normocephalic and atraumatic.     Right Ear: External ear normal.     Left Ear: External ear normal.  Eyes:     General: No scleral icterus. Neck:     Thyroid : No thyromegaly.  Cardiovascular:     Rate and Rhythm: Normal rate and regular rhythm.     Heart sounds: Normal heart sounds. No murmur heard. Pulmonary:     Effort: Pulmonary effort is normal. No respiratory distress.     Breath sounds: Normal breath sounds. No wheezing.  Musculoskeletal:     Cervical back: Neck supple.  Skin:    General: Skin is warm and dry.  Neurological:     Mental Status: She is alert and oriented to person, place, and time.  Psychiatric:        Mood and Affect: Mood normal.        Behavior: Behavior normal.        Thought Content: Thought content normal.        Judgment: Judgment normal.    Diabetic Foot Exam - Simple   Simple Foot Form Diabetic Foot exam was performed with the following findings: Yes 11/16/2024 10:30 AM  Visual Inspection No deformities, no ulcerations, no other skin breakdown bilaterally: Yes Sensation Testing Intact to touch and monofilament testing bilaterally: Yes Pulse Check Posterior Tibialis and Dorsalis pulse intact bilaterally: Yes Comments      BP 138/73 (BP Location: Right Arm, Patient Position: Sitting, Cuff Size: Small)   Pulse 80   Temp 98 F (36.7 C) (Oral)   Resp 16   Ht 5' 5 (1.651 m)   Wt  171 lb (77.6 kg)   LMP 11/25/1994   SpO2 100%   BMI 28.46 kg/m  Wt Readings from Last 3 Encounters:  11/16/24 171 lb (77.6 kg)  10/11/24 170 lb (77.1 kg)  08/03/24 174 lb (78.9 kg)       Assessment & Plan:   Problem List Items Addressed This Visit       Unprioritized   Tobacco abuse   Counseled on importance of quitting. Update CT lung cancer screening.      Relevant Orders   CT CHEST LUNG CA SCREEN LOW DOSE W/O CM   Hypokalemia   Continues Kdur.       Hypertension -  Primary   BP stable on carvedilol , hydralazine  losartan .        Relevant Medications   Evolocumab  (REPATHA  SURECLICK) 140 MG/ML SOAJ   Hyperlipidemia LDL goal <70   Lab Results  Component Value Date   CHOL 224 (H) 07/23/2024   HDL 33.70 (L) 07/23/2024   LDLCALC 150 (H) 07/23/2024   LDLDIRECT 175.0 08/10/2018   TRIG 201.0 (H) 07/23/2024   CHOLHDL 7 07/23/2024   Above goal, not taking crestor  due to leg pain.  Will see if we can get Repatha  covered by her insurance instead.       Relevant Medications   Evolocumab  (REPATHA  SURECLICK) 140 MG/ML SOAJ   GERD (gastroesophageal reflux disease)   Stable on pantoprazole  once daily.       Relevant Medications   pantoprazole  (PROTONIX ) 40 MG tablet   Diabetes type 2, controlled (HCC)   Lab Results  Component Value Date   HGBA1C 6.4 07/23/2024   HGBA1C 6.0 (H) 10/31/2023   HGBA1C 5.9 (H) 04/07/2023   Lab Results  Component Value Date   MICROALBUR 0.4 10/31/2023   LDLCALC 150 (H) 07/23/2024   CREATININE 0.82 07/23/2024   Update A1C.        Relevant Orders   HgB A1c   Urine Microalbumin w/creat. ratio   Comp Met (CMET)   CAD in native artery s/p DES LAD, DES RCA 10/26/19   No recent chest pain, continues imdur , aspirin  beta blocker and statin. Has upcoming follow up with cardiology.       Relevant Medications   Evolocumab  (REPATHA  SURECLICK) 140 MG/ML SOAJ   Other Visit Diagnoses       Statin intolerance       Relevant Medications    Evolocumab  (REPATHA  SURECLICK) 140 MG/ML SOAJ     Acute vaginitis       Relevant Medications   fluconazole  (DIFLUCAN ) 150 MG tablet     Flu shot today. Filled Handicapped placard form.  I have discontinued Delbert D. Rohlfs's rosuvastatin . I have also changed her pantoprazole . Additionally, I am having her start on Repatha  SureClick and fluconazole . Lastly, I am having her maintain her carvedilol , isosorbide  mononitrate, nitroGLYCERIN , hydrALAZINE , aspirin  EC, linaclotide , sucralfate , potassium chloride  SA, losartan , and gabapentin .  Meds ordered this encounter  Medications   pantoprazole  (PROTONIX ) 40 MG tablet    Sig: Take 1 tablet (40 mg total) by mouth daily.    Supervising Provider:   DOMENICA BLACKBIRD A [[id number]]   Evolocumab  (REPATHA  SURECLICK) 140 MG/ML SOAJ    Sig: Inject 140 mg into the skin every 14 (fourteen) days.    Dispense:  2 mL    Refill:  2    Supervising Provider:   DOMENICA BLACKBIRD A [[id number]]   fluconazole  (DIFLUCAN ) 150 MG tablet    Sig: Take 1 tablet by mouth as needed for vaginal yeast infection, may repeat in 3 days if needed    Dispense:  2 tablet    Refill:  0    Supervising Provider:   DOMENICA BLACKBIRD A [[id number]]      [1]  Allergies Allergen Reactions   Shrimp [Shellfish Allergy] Itching    Rash and lips itching   Ace Inhibitors Cough    Cough   Crestor  [Rosuvastatin ]     Leg pain    Aspirin  Nausea And Vomiting    Ok with taking coated 81mg    Azithromycin Rash   "

## 2024-11-16 NOTE — Assessment & Plan Note (Signed)
 No recent chest pain, continues imdur , aspirin  beta blocker and statin. Has upcoming follow up with cardiology.

## 2024-11-16 NOTE — Patient Instructions (Signed)
" °  VISIT SUMMARY: During your visit, we reviewed your current health status, including your diabetes, heart disease, high blood pressure, cholesterol, heartburn, potassium levels, smoking habits, and back pain. We also discussed your recent weight changes, smoking cessation, and general health maintenance, including vaccinations and screenings.  YOUR PLAN: -TYPE 2 DIABETES MELLITUS: Your diabetes is well-managed with an A1c of 6.4, though it has slightly increased. We checked your urine for protein and will continue with your current diabetes management plan.  -ATHEROSCLEROTIC HEART DISEASE OF NATIVE CORONARY ARTERY: This condition involves the narrowing of your heart's arteries. You have not experienced recent chest pain, and you should continue taking pantoprazole  once daily. Follow up with cardiology in January.  -PRIMARY HYPERTENSION: Your high blood pressure is well-controlled. Continue with your current medications.  -HYPERLIPIDEMIA WITH STATIN INTOLERANCE: Your cholesterol is elevated, and you cannot take statins due to leg cramps. We discussed Repatha  as an alternative and will check your insurance coverage for it. If covered, we will start you on Repatha .  -GASTROESOPHAGEAL REFLUX DISEASE: This condition causes heartburn. You have not had recent episodes, so continue taking pantoprazole  once daily.  -HYPOKALEMIA: This means you have low potassium levels. Continue with your potassium supplements.  -TOBACCO USE DISORDER: You expressed a desire to quit smoking. We offered support for smoking cessation if you wish to pursue it.  -ACUTE VAGINITIS: You have a yeast infection causing itching. We prescribed an antifungal medication to treat it.  -LOW BACK PAIN WITH RADICULOPATHY AND GAIT DISTURBANCE: You have back pain with nerve pressure affecting your leg and balance. We are considering a surgical consultation and will continue with pain management and walking support.  -GENERAL HEALTH  MAINTENANCE: You are due for a flu shot, COVID booster, and lung cancer screening CT. We administered your flu shot, scheduled your COVID booster, and ordered a CT scan. Ensure you attend your eye exam on January 14th.  INSTRUCTIONS: Follow up with cardiology in January. Ensure you attend your eye exam on January 14th. We will check your insurance coverage for Repatha  and initiate it if covered. Continue with your current medications and supplements. If you wish to quit smoking, let us  know so we can provide support. Attend your scheduled COVID booster and lung cancer screening CT.                "

## 2024-11-16 NOTE — Assessment & Plan Note (Signed)
 Continues Kdur.

## 2024-11-16 NOTE — Assessment & Plan Note (Signed)
 Lab Results  Component Value Date   HGBA1C 6.4 07/23/2024   HGBA1C 6.0 (H) 10/31/2023   HGBA1C 5.9 (H) 04/07/2023   Lab Results  Component Value Date   MICROALBUR 0.4 10/31/2023   LDLCALC 150 (H) 07/23/2024   CREATININE 0.82 07/23/2024   Update A1C.

## 2024-11-16 NOTE — Assessment & Plan Note (Addendum)
 Lab Results  Component Value Date   CHOL 224 (H) 07/23/2024   HDL 33.70 (L) 07/23/2024   LDLCALC 150 (H) 07/23/2024   LDLDIRECT 175.0 08/10/2018   TRIG 201.0 (H) 07/23/2024   CHOLHDL 7 07/23/2024   Above goal, not taking crestor  due to leg pain.  Will see if we can get Repatha  covered by her insurance instead.

## 2024-11-23 ENCOUNTER — Ambulatory Visit: Admitting: Family

## 2024-12-01 ENCOUNTER — Ambulatory Visit (HOSPITAL_BASED_OUTPATIENT_CLINIC_OR_DEPARTMENT_OTHER)
Admission: RE | Admit: 2024-12-01 | Discharge: 2024-12-01 | Disposition: A | Discharge status: Home / Self Care | Source: Ambulatory Visit | Attending: Family | Admitting: Family

## 2024-12-01 DIAGNOSIS — Z720 Tobacco use: Secondary | ICD-10-CM | POA: Diagnosis present

## 2024-12-01 DIAGNOSIS — Z122 Encounter for screening for malignant neoplasm of respiratory organs: Secondary | ICD-10-CM | POA: Diagnosis not present

## 2024-12-01 DIAGNOSIS — I2510 Atherosclerotic heart disease of native coronary artery without angina pectoris: Secondary | ICD-10-CM | POA: Insufficient documentation

## 2024-12-01 DIAGNOSIS — J439 Emphysema, unspecified: Secondary | ICD-10-CM | POA: Insufficient documentation

## 2024-12-01 DIAGNOSIS — F17210 Nicotine dependence, cigarettes, uncomplicated: Secondary | ICD-10-CM | POA: Diagnosis not present

## 2024-12-01 DIAGNOSIS — I700 Atherosclerosis of aorta: Secondary | ICD-10-CM | POA: Diagnosis not present

## 2024-12-13 ENCOUNTER — Telehealth: Payer: Self-pay | Admitting: Family

## 2024-12-13 NOTE — Telephone Encounter (Signed)
 Electronic request sent

## 2024-12-13 NOTE — Telephone Encounter (Signed)
 Please request DM eye report from [facility] eye.

## 2025-02-15 ENCOUNTER — Ambulatory Visit: Admitting: Family

## 2025-08-09 ENCOUNTER — Ambulatory Visit
